# Patient Record
Sex: Female | Born: 1935 | Race: White | Hispanic: No | Smoking: Former smoker
Health system: Southern US, Community
[De-identification: ages and names within clinical notes are randomized; demographics above are authoritative.]

## PROBLEM LIST (undated history)

## (undated) DIAGNOSIS — R232 Flushing: Secondary | ICD-10-CM

## (undated) DIAGNOSIS — M797 Fibromyalgia: Secondary | ICD-10-CM

## (undated) DIAGNOSIS — Z9889 Other specified postprocedural states: Secondary | ICD-10-CM

## (undated) DIAGNOSIS — K449 Diaphragmatic hernia without obstruction or gangrene: Secondary | ICD-10-CM

## (undated) DIAGNOSIS — A46 Erysipelas: Secondary | ICD-10-CM

## (undated) DIAGNOSIS — K219 Gastro-esophageal reflux disease without esophagitis: Secondary | ICD-10-CM

## (undated) DIAGNOSIS — M1990 Unspecified osteoarthritis, unspecified site: Secondary | ICD-10-CM

## (undated) DIAGNOSIS — N281 Cyst of kidney, acquired: Secondary | ICD-10-CM

## (undated) DIAGNOSIS — Z860100 Personal history of colon polyps, unspecified: Secondary | ICD-10-CM

## (undated) DIAGNOSIS — F419 Anxiety disorder, unspecified: Secondary | ICD-10-CM

## (undated) DIAGNOSIS — Z5189 Encounter for other specified aftercare: Secondary | ICD-10-CM

## (undated) DIAGNOSIS — I471 Supraventricular tachycardia, unspecified: Secondary | ICD-10-CM

## (undated) DIAGNOSIS — E785 Hyperlipidemia, unspecified: Secondary | ICD-10-CM

## (undated) DIAGNOSIS — I4891 Unspecified atrial fibrillation: Secondary | ICD-10-CM

## (undated) DIAGNOSIS — K5730 Diverticulosis of large intestine without perforation or abscess without bleeding: Secondary | ICD-10-CM

## (undated) DIAGNOSIS — I059 Rheumatic mitral valve disease, unspecified: Secondary | ICD-10-CM

## (undated) DIAGNOSIS — IMO0001 Reserved for inherently not codable concepts without codable children: Secondary | ICD-10-CM

## (undated) DIAGNOSIS — I4892 Unspecified atrial flutter: Secondary | ICD-10-CM

## (undated) DIAGNOSIS — E079 Disorder of thyroid, unspecified: Secondary | ICD-10-CM

## (undated) DIAGNOSIS — R112 Nausea with vomiting, unspecified: Secondary | ICD-10-CM

## (undated) DIAGNOSIS — Z9189 Other specified personal risk factors, not elsewhere classified: Secondary | ICD-10-CM

## (undated) HISTORY — DX: Diaphragmatic hernia without obstruction or gangrene: K44.9

## (undated) HISTORY — DX: Rheumatic mitral valve disease, unspecified: I05.9

## (undated) HISTORY — DX: Erysipelas: A46

## (undated) HISTORY — DX: Cyst of kidney, acquired: N28.1

## (undated) HISTORY — DX: Anxiety disorder, unspecified: F41.9

## (undated) HISTORY — PX: ABDOMINAL HYSTERECTOMY: SHX81

## (undated) HISTORY — DX: Diverticulosis of large intestine without perforation or abscess without bleeding: K57.30

## (undated) HISTORY — PX: OOPHORECTOMY: SHX86

## (undated) HISTORY — DX: Reserved for inherently not codable concepts without codable children: IMO0001

## (undated) HISTORY — DX: Disorder of thyroid, unspecified: E07.9

## (undated) HISTORY — DX: Supraventricular tachycardia, unspecified: I47.10

## (undated) HISTORY — DX: Supraventricular tachycardia: I47.1

## (undated) HISTORY — PX: UPPER GASTROINTESTINAL ENDOSCOPY: SHX188

## (undated) HISTORY — PX: OTHER SURGICAL HISTORY: SHX169

## (undated) HISTORY — DX: Hyperlipidemia, unspecified: E78.5

## (undated) HISTORY — DX: Unspecified osteoarthritis, unspecified site: M19.90

## (undated) HISTORY — DX: Personal history of colon polyps, unspecified: Z86.0100

## (undated) HISTORY — PX: ESOPHAGOGASTRODUODENOSCOPY: SHX1529

## (undated) HISTORY — DX: Encounter for other specified aftercare: Z51.89

## (undated) HISTORY — DX: Fibromyalgia: M79.7

## (undated) HISTORY — DX: Other specified personal risk factors, not elsewhere classified: Z91.89

## (undated) HISTORY — PX: CATARACT EXTRACTION BILATERAL W/ ANTERIOR VITRECTOMY: SHX1304

## (undated) HISTORY — DX: Personal history of colonic polyps: Z86.010

## (undated) HISTORY — PX: COLONOSCOPY: SHX174

## (undated) HISTORY — PX: BREAST BIOPSY: SHX20

## (undated) HISTORY — DX: Flushing: R23.2

## (undated) HISTORY — DX: Gastro-esophageal reflux disease without esophagitis: K21.9

---
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
## 1997-12-15 ENCOUNTER — Other Ambulatory Visit: Admission: RE | Admit: 1997-12-15 | Discharge: 1997-12-15 | Payer: Self-pay | Admitting: Obstetrics & Gynecology

## 2000-01-06 ENCOUNTER — Ambulatory Visit (HOSPITAL_COMMUNITY): Admission: RE | Admit: 2000-01-06 | Discharge: 2000-01-07 | Payer: Self-pay | Admitting: Internal Medicine

## 2000-01-06 ENCOUNTER — Encounter: Payer: Self-pay | Admitting: Internal Medicine

## 2001-08-19 HISTORY — PX: ROTATOR CUFF REPAIR: SHX139

## 2001-09-13 ENCOUNTER — Observation Stay (HOSPITAL_COMMUNITY): Admission: RE | Admit: 2001-09-13 | Discharge: 2001-09-14 | Payer: Self-pay | Admitting: Orthopedic Surgery

## 2002-01-08 ENCOUNTER — Other Ambulatory Visit: Admission: RE | Admit: 2002-01-08 | Discharge: 2002-01-08 | Payer: Self-pay | Admitting: Obstetrics & Gynecology

## 2004-04-05 ENCOUNTER — Other Ambulatory Visit: Admission: RE | Admit: 2004-04-05 | Discharge: 2004-04-05 | Payer: Self-pay | Admitting: Obstetrics & Gynecology

## 2004-12-13 ENCOUNTER — Ambulatory Visit: Payer: Self-pay | Admitting: Gastroenterology

## 2004-12-27 ENCOUNTER — Ambulatory Visit: Payer: Self-pay | Admitting: Gastroenterology

## 2005-06-09 ENCOUNTER — Ambulatory Visit: Payer: Self-pay | Admitting: Pulmonary Disease

## 2005-06-14 ENCOUNTER — Ambulatory Visit: Payer: Self-pay | Admitting: Pulmonary Disease

## 2006-07-11 ENCOUNTER — Ambulatory Visit: Payer: Self-pay | Admitting: Pulmonary Disease

## 2007-07-19 ENCOUNTER — Ambulatory Visit: Payer: Self-pay | Admitting: Pulmonary Disease

## 2007-07-19 LAB — CONVERTED CEMR LAB
ALT: 15 units/L (ref 0–35)
AST: 17 units/L (ref 0–37)
Albumin: 4 g/dL (ref 3.5–5.2)
Alkaline Phosphatase: 83 units/L (ref 39–117)
BUN: 13 mg/dL (ref 6–23)
Basophils Absolute: 0.1 10*3/uL (ref 0.0–0.1)
Basophils Relative: 0.8 % (ref 0.0–1.0)
Bilirubin Urine: NEGATIVE
Bilirubin, Direct: 0.1 mg/dL (ref 0.0–0.3)
CO2: 33 meq/L — ABNORMAL HIGH (ref 19–32)
Calcium: 9.2 mg/dL (ref 8.4–10.5)
Chloride: 104 meq/L (ref 96–112)
Cholesterol: 245 mg/dL (ref 0–200)
Creatinine, Ser: 0.8 mg/dL (ref 0.4–1.2)
Direct LDL: 175.3 mg/dL
Eosinophils Absolute: 0.2 10*3/uL (ref 0.0–0.6)
Eosinophils Relative: 3 % (ref 0.0–5.0)
GFR calc Af Amer: 91 mL/min
GFR calc non Af Amer: 75 mL/min
Glucose, Bld: 93 mg/dL (ref 70–99)
HCT: 37.6 % (ref 36.0–46.0)
HDL: 42.7 mg/dL (ref >39.0)
Hemoglobin, Urine: NEGATIVE
Hemoglobin: 13.1 g/dL (ref 12.0–15.0)
Ketones, ur: NEGATIVE mg/dL
Leukocytes, UA: NEGATIVE
Lymphocytes Relative: 20.5 % (ref 12.0–46.0)
MCHC: 34.9 g/dL (ref 30.0–36.0)
MCV: 98.5 fL (ref 78.0–100.0)
Monocytes Absolute: 0.5 10*3/uL (ref 0.2–0.7)
Monocytes Relative: 7.7 % (ref 3.0–11.0)
Neutro Abs: 4.6 10*3/uL (ref 1.4–7.7)
Neutrophils Relative %: 68 % (ref 43.0–77.0)
Nitrite: NEGATIVE
Platelets: 222 10*3/uL (ref 150–400)
Potassium: 4.6 meq/L (ref 3.5–5.1)
RBC: 3.82 M/uL — ABNORMAL LOW (ref 3.87–5.11)
RDW: 12 % (ref 11.5–14.6)
Sodium: 142 meq/L (ref 135–145)
Specific Gravity, Urine: <=1.005 (ref 1.000–1.03)
TSH: 3.84 microintl units/mL (ref 0.35–5.50)
Total Bilirubin: 0.5 mg/dL (ref 0.3–1.2)
Total CHOL/HDL Ratio: 5.7
Total Protein, Urine: NEGATIVE mg/dL
Total Protein: 6.8 g/dL (ref 6.0–8.3)
Triglycerides: 108 mg/dL (ref 0–149)
Urine Glucose: NEGATIVE mg/dL
Urobilinogen, UA: 0.2 (ref 0.0–1.0)
VLDL: 22 mg/dL (ref 0–40)
WBC: 6.8 10*3/uL (ref 4.5–10.5)
pH: 7 (ref 5.0–8.0)

## 2007-07-20 ENCOUNTER — Ambulatory Visit: Payer: Self-pay | Admitting: Internal Medicine

## 2007-07-28 DIAGNOSIS — IMO0001 Reserved for inherently not codable concepts without codable children: Secondary | ICD-10-CM

## 2007-07-28 DIAGNOSIS — I059 Rheumatic mitral valve disease, unspecified: Secondary | ICD-10-CM

## 2007-07-28 DIAGNOSIS — Z9189 Other specified personal risk factors, not elsewhere classified: Secondary | ICD-10-CM

## 2007-07-28 DIAGNOSIS — F411 Generalized anxiety disorder: Secondary | ICD-10-CM | POA: Insufficient documentation

## 2007-07-28 DIAGNOSIS — I498 Other specified cardiac arrhythmias: Secondary | ICD-10-CM | POA: Insufficient documentation

## 2007-07-28 DIAGNOSIS — E039 Hypothyroidism, unspecified: Secondary | ICD-10-CM | POA: Insufficient documentation

## 2007-07-28 HISTORY — DX: Reserved for inherently not codable concepts without codable children: IMO0001

## 2007-07-28 HISTORY — DX: Rheumatic mitral valve disease, unspecified: I05.9

## 2007-07-28 HISTORY — DX: Other specified personal risk factors, not elsewhere classified: Z91.89

## 2007-08-07 ENCOUNTER — Ambulatory Visit: Payer: Self-pay | Admitting: Pulmonary Disease

## 2007-08-07 LAB — CONVERTED CEMR LAB
Fecal Occult Blood: NEGATIVE
OCCULT 1: NEGATIVE
OCCULT 2: NEGATIVE
OCCULT 3: NEGATIVE
OCCULT 4: NEGATIVE
OCCULT 5: NEGATIVE

## 2007-09-05 ENCOUNTER — Telehealth: Payer: Self-pay | Admitting: Pulmonary Disease

## 2007-10-25 ENCOUNTER — Ambulatory Visit: Payer: Self-pay | Admitting: Internal Medicine

## 2008-05-13 ENCOUNTER — Ambulatory Visit: Payer: Self-pay | Admitting: Internal Medicine

## 2008-05-13 ENCOUNTER — Encounter: Payer: Self-pay | Admitting: Adult Health

## 2008-05-13 LAB — CONVERTED CEMR LAB: Vit D, 1,25-Dihydroxy: 36 (ref 30–89)

## 2008-05-15 ENCOUNTER — Telehealth (INDEPENDENT_AMBULATORY_CARE_PROVIDER_SITE_OTHER): Payer: Self-pay | Admitting: *Deleted

## 2008-05-16 LAB — CONVERTED CEMR LAB: TSH: 4.56 microintl units/mL (ref 0.35–5.50)

## 2008-05-20 ENCOUNTER — Telehealth (INDEPENDENT_AMBULATORY_CARE_PROVIDER_SITE_OTHER): Payer: Self-pay | Admitting: *Deleted

## 2008-07-15 ENCOUNTER — Telehealth (INDEPENDENT_AMBULATORY_CARE_PROVIDER_SITE_OTHER): Payer: Self-pay | Admitting: *Deleted

## 2008-07-22 ENCOUNTER — Ambulatory Visit: Payer: Self-pay | Admitting: Pulmonary Disease

## 2008-07-22 LAB — CONVERTED CEMR LAB
TSH: 1.17 microintl units/mL (ref 0.35–5.50)
Vit D, 1,25-Dihydroxy: 50 (ref 30–89)

## 2008-08-01 ENCOUNTER — Ambulatory Visit: Payer: Self-pay | Admitting: Internal Medicine

## 2008-08-04 ENCOUNTER — Ambulatory Visit: Payer: Self-pay | Admitting: Pulmonary Disease

## 2008-08-19 ENCOUNTER — Telehealth: Payer: Self-pay | Admitting: Pulmonary Disease

## 2008-08-22 ENCOUNTER — Ambulatory Visit: Payer: Self-pay | Admitting: Pulmonary Disease

## 2008-08-25 LAB — CONVERTED CEMR LAB
AST: 17 units/L (ref 0–37)
Alkaline Phosphatase: 81 units/L (ref 39–117)
Basophils Absolute: 0 10*3/uL (ref 0.0–0.1)
Chloride: 103 meq/L (ref 96–112)
Eosinophils Absolute: 0.2 10*3/uL (ref 0.0–0.7)
Eosinophils Relative: 3.1 % (ref 0.0–5.0)
GFR calc non Af Amer: 75 mL/min
HDL: 34.7 mg/dL — ABNORMAL LOW (ref >39.0)
MCV: 98.8 fL (ref 78.0–100.0)
Neutrophils Relative %: 62.4 % (ref 43.0–77.0)
Platelets: 182 10*3/uL (ref 150–400)
Potassium: 4.6 meq/L (ref 3.5–5.1)
Total Bilirubin: 0.6 mg/dL (ref 0.3–1.2)
Triglycerides: 108 mg/dL (ref 0–149)
VLDL: 22 mg/dL (ref 0–40)
WBC: 5.9 10*3/uL (ref 4.5–10.5)

## 2008-09-05 ENCOUNTER — Ambulatory Visit: Payer: Self-pay | Admitting: Pulmonary Disease

## 2008-09-23 ENCOUNTER — Telehealth (INDEPENDENT_AMBULATORY_CARE_PROVIDER_SITE_OTHER): Payer: Self-pay | Admitting: *Deleted

## 2008-09-29 ENCOUNTER — Telehealth (INDEPENDENT_AMBULATORY_CARE_PROVIDER_SITE_OTHER): Payer: Self-pay | Admitting: *Deleted

## 2008-10-15 ENCOUNTER — Telehealth (INDEPENDENT_AMBULATORY_CARE_PROVIDER_SITE_OTHER): Payer: Self-pay | Admitting: *Deleted

## 2008-10-28 ENCOUNTER — Telehealth: Payer: Self-pay | Admitting: Pulmonary Disease

## 2008-10-30 ENCOUNTER — Ambulatory Visit: Payer: Self-pay | Admitting: Pulmonary Disease

## 2008-10-31 ENCOUNTER — Telehealth: Payer: Self-pay | Admitting: Pulmonary Disease

## 2008-11-10 ENCOUNTER — Ambulatory Visit: Payer: Self-pay | Admitting: Pulmonary Disease

## 2008-12-08 ENCOUNTER — Ambulatory Visit: Payer: Self-pay | Admitting: Internal Medicine

## 2009-02-09 ENCOUNTER — Ambulatory Visit: Payer: Self-pay | Admitting: Internal Medicine

## 2009-02-24 ENCOUNTER — Telehealth: Payer: Self-pay | Admitting: Internal Medicine

## 2009-02-25 ENCOUNTER — Encounter (INDEPENDENT_AMBULATORY_CARE_PROVIDER_SITE_OTHER): Payer: Self-pay | Admitting: *Deleted

## 2009-03-05 ENCOUNTER — Telehealth: Payer: Self-pay | Admitting: Pulmonary Disease

## 2009-03-09 ENCOUNTER — Ambulatory Visit: Payer: Self-pay | Admitting: Pulmonary Disease

## 2009-03-18 ENCOUNTER — Ambulatory Visit: Payer: Self-pay | Admitting: Pulmonary Disease

## 2009-03-18 LAB — CONVERTED CEMR LAB
Albumin: 3.8 g/dL (ref 3.5–5.2)
HDL: 39.1 mg/dL (ref >39.00)
LDL Cholesterol: 108 mg/dL — ABNORMAL HIGH (ref 0–99)
Total CHOL/HDL Ratio: 4
Triglycerides: 98 mg/dL (ref 0.0–149.0)
VLDL: 19.6 mg/dL (ref 0.0–40.0)

## 2009-04-16 ENCOUNTER — Telehealth: Payer: Self-pay | Admitting: Pulmonary Disease

## 2009-04-30 ENCOUNTER — Ambulatory Visit: Payer: Self-pay | Admitting: Internal Medicine

## 2009-08-06 ENCOUNTER — Ambulatory Visit: Payer: Self-pay | Admitting: Pulmonary Disease

## 2009-08-06 ENCOUNTER — Encounter (INDEPENDENT_AMBULATORY_CARE_PROVIDER_SITE_OTHER): Payer: Self-pay | Admitting: *Deleted

## 2009-08-09 LAB — CONVERTED CEMR LAB
ALT: 65 units/L — ABNORMAL HIGH (ref 0–35)
BUN: 10 mg/dL (ref 6–23)
Basophils Absolute: 0 10*3/uL (ref 0.0–0.1)
Bilirubin, Direct: 0.1 mg/dL (ref 0.0–0.3)
CRP, High Sensitivity: 6.4 — ABNORMAL HIGH (ref 0.00–5.00)
Chloride: 102 meq/L (ref 96–112)
Cholesterol: 210 mg/dL — ABNORMAL HIGH (ref 0–200)
Creatinine, Ser: 0.7 mg/dL (ref 0.4–1.2)
Direct LDL: 139.2 mg/dL
Eosinophils Relative: 2 % (ref 0.0–5.0)
GFR calc non Af Amer: 87.26 mL/min (ref >60)
HDL: 41.3 mg/dL (ref >39.00)
MCV: 98.7 fL (ref 78.0–100.0)
Monocytes Absolute: 0.5 10*3/uL (ref 0.1–1.0)
Neutrophils Relative %: 68.1 % (ref 43.0–77.0)
Platelets: 172 10*3/uL (ref 150.0–400.0)
Total Bilirubin: 1 mg/dL (ref 0.3–1.2)
VLDL: 31.4 mg/dL (ref 0.0–40.0)
WBC: 6.2 10*3/uL (ref 4.5–10.5)

## 2009-08-12 ENCOUNTER — Telehealth (INDEPENDENT_AMBULATORY_CARE_PROVIDER_SITE_OTHER): Payer: Self-pay | Admitting: *Deleted

## 2009-08-14 ENCOUNTER — Ambulatory Visit (HOSPITAL_COMMUNITY): Admission: RE | Admit: 2009-08-14 | Discharge: 2009-08-14 | Payer: Self-pay | Admitting: Pulmonary Disease

## 2009-09-07 ENCOUNTER — Telehealth: Payer: Self-pay | Admitting: Gastroenterology

## 2009-09-19 HISTORY — PX: BREAST BIOPSY: SHX20

## 2009-11-26 ENCOUNTER — Encounter (INDEPENDENT_AMBULATORY_CARE_PROVIDER_SITE_OTHER): Payer: Self-pay | Admitting: *Deleted

## 2010-07-28 ENCOUNTER — Encounter: Payer: Self-pay | Admitting: Pulmonary Disease

## 2010-08-02 ENCOUNTER — Encounter: Payer: Self-pay | Admitting: Pulmonary Disease

## 2010-08-02 ENCOUNTER — Other Ambulatory Visit: Admission: RE | Admit: 2010-08-02 | Discharge: 2010-08-02 | Payer: Self-pay | Admitting: Radiology

## 2010-08-09 ENCOUNTER — Telehealth: Payer: Self-pay | Admitting: Pulmonary Disease

## 2010-08-16 ENCOUNTER — Ambulatory Visit: Payer: Self-pay | Admitting: Pulmonary Disease

## 2010-09-03 LAB — CONVERTED CEMR LAB
ALT: 15 units/L (ref 0–35)
BUN: 17 mg/dL (ref 6–23)
Bilirubin, Direct: 0.1 mg/dL (ref 0.0–0.3)
Calcium: 9 mg/dL (ref 8.4–10.5)
Cholesterol: 183 mg/dL (ref 0–200)
Creatinine, Ser: 0.7 mg/dL (ref 0.4–1.2)
Eosinophils Relative: 2.9 % (ref 0.0–5.0)
GFR calc non Af Amer: 81.61 mL/min (ref >60)
Lymphocytes Relative: 18.5 % (ref 12.0–46.0)
Monocytes Relative: 7.4 % (ref 3.0–12.0)
Neutrophils Relative %: 70.8 % (ref 43.0–77.0)
Platelets: 202 10*3/uL (ref 150.0–400.0)
Potassium: 4.7 meq/L (ref 3.5–5.1)
TSH: 1.44 microintl units/mL (ref 0.35–5.50)
Total Protein: 6.4 g/dL (ref 6.0–8.3)
VLDL: 20 mg/dL (ref 0.0–40.0)
WBC: 7.2 10*3/uL (ref 4.5–10.5)

## 2010-09-19 HISTORY — PX: KNEE ARTHROSCOPY: SUR90

## 2010-10-13 ENCOUNTER — Ambulatory Visit
Admission: RE | Admit: 2010-10-13 | Discharge: 2010-10-13 | Payer: Self-pay | Source: Home / Self Care | Attending: Pulmonary Disease | Admitting: Pulmonary Disease

## 2010-10-21 NOTE — Letter (Signed)
Summary: Colonoscopy Letter  [facility]  [address] [place], [place] [address]   Phone: [phone]  Fax: [phone]      November 26, 2009 MRN: [id number]   [facility] [address] [place], [place]  [address]   Dear Ms. Dalton,   According to your medical record, it is time for you to schedule a Colonoscopy. The American Cancer Society recommends this procedure as a method to detect early colon cancer. Patients with a family history of colon cancer, or a personal history of colon polyps or inflammatory bowel disease are at increased risk.  This letter has beeen generated based on the recommendations made at the time of your procedure. If you feel that in your particular situation this may no longer apply, please contact our office.  Please call our office at [phone] to schedule this appointment or to update your records at your earliest convenience.  Thank you for cooperating with [place] to provide you with the very best care possible.   Sincerely,   Vania Rea. Jarold Motto, M.D.  [facility] Gastroenterology Division [phone]

## 2010-10-21 NOTE — Progress Notes (Signed)
Summary: set up labs  Phone Note Call from Patient Call back at Home Phone [phone]   Caller: Patient Call For: nadel Summary of Call: pt wants labs set up for pending physical 08/19/10 Initial call taken by: Tivis Ringer, CNA,  August 09, 2010 4:54 PM  Follow-up for Phone Call        pt coming on 11/28 for labs pls ask sn what labs need to be done and put in the system --no need to call pt back Follow-up by: Philipp Deputy CMA,  August 09, 2010 4:59 PM  Additional Follow-up for Phone Call Additional follow up Details #1::        labs are in the computer for pt and she is aware of labs in computer Randell Loop CMA  August 10, 2010 5:01 PM

## 2010-11-04 NOTE — Assessment & Plan Note (Signed)
Summary: cpx/ mbw   Primary Care Provider:  Dr. Alroy Dust  CC:  14 month ROV & review of mult medical problems....  History of Present Illness: 76 y/o Hart here for a follow up visit... he has multiple medical problems as noted below...     ~  Nov09:  seen 10/08 c/o decr energy and wanted her Synthroid adjusted- changed from 75 to 1mcg/d w/ f/u TSH 11/09 = 1.17 and she feels better and is much happier... she has done some internet research and she wants to keep this level in the 1-2 range...  she takes mult herbal and vitamin preps from [employer]- CoQ10, Grape seed extract, Red yeast rice, MVI, Garlic, & Folate...   ~  Jun10:  she notes aching/ sore all over and blames the Pravachol40 ("I hurt, and can't move as fast on the tennis court")... she stopped it for 1 month without change in her symptoms, but she still wants off the [place] Rx...  She developed a rash & was seen by DrGould w/ Dx of "granuloma annulaire" & treated w/ Dapsone (she notes they did blood work & reports that it was normal> * subseq tells me this med affected her liver & her blood count & was DC'd).  ~  Nov10:  she notes some recent sinus symptoms & is using saline nasal spray... she feels that she is having a problem w/ her HH- c/o reflux symptoms & epig discomfort & would like a f/u appt w/ GI, DrPatterson (*she never went) & in the interim change her Prn Pepcid to PROTONIX 40mg  Qam & Pepcid 20mg Qhs... she had a f/u visit w/ DrTaylor 8/10- hx SVT improved w/ incr Metoprolol to 1/2 tab Qhs, but she credits stopping Aleve ("people who take alot of Aleve get heart problems" she say).   ~  October 13, 2010:  25mo ROV- under incr stress w/ husb illness, LBP (VAH & DrRamos)... she had abn mammogram w/ benign bx 11/11 & f/u planned 41mo... she's had some palpit but improved off Aleve rx she says & advised decr caffeine etc... she is overdue for f/u w/ GI DrPatterson but declines to let us set this up for her... she had labs 11/11-  reviewed w/ pt; she requests 90d refill rx's.   Current Problem List:  CHEST WALL PAIN, HX OF (ICD-V15.89) - she takes ASA 81mg /d...  ~  11/10: she request hs-CRP test & we discussed this... it ret elevated= 6.4 & she will repeat it & discuss poss of further cardiac eval w/ DrTaylor...  ? of MITRAL VALVE PROLAPSE (ICD-424.0) - baseline EKG is WNL.[name] Kitchen. can't find prev 2DEcho (old chart in [facility] office)... currently denies CP or palpitations...  SUPRAVENTRICULAR TACHYCARDIA (ICD-427.89) - hx SVT w/ electrophysiologic studies by DrTaylor in 2001 w/ successful RF ablation... he placed her on Metoprolol 25mg  Qhs but she has since stopped this on her own.  ~  8/10:  f/u note from DrTaylor reviewed.  ~  she states palpit have decreased since she stopped Aleve.  HYPERCHOLESTEROLEMIA (ICD-272.0) - hx of significantly elevated cholesterol values- she has refused Med Rx and tries to control this w/ diet + Fish Oil + Red Yeast Rice, etc.  ~  Her best FLP over the last 56yrs was 3/03 w/ TChol 204, TG 93, HDL 54, LDL 132  ~  FLP 10/08 showed TChol 245, TG 108, HDL 43, LDL 175... tried Zocor- stopped due to rash.  ~  FLP 12/09 showed TChol 228, TG 108,  HDL 35, LDL 161... rec> try Prav40/d.  ~  FLP 6/10 on Prav40 showed TChol 167, TG 98, HDL 39, LDL 108... she wants off PravRx.  ~  11/10 she is off the Prav40- & notes that "cholesterol is not as big a deal as they previously thought"...      FLP showed TChol 210, TG 157, HDL 41, LDL 139... pt offered Lipid Clinic appt & she will think about it.  ~  FLP 11/11 showed TChol 183, TG 100, HDL 41, LDL 122  HYPOTHYROIDISM (ICD-244.9) - on SYNTHROID 81mcg/d now...  ~  labs 11/09 showed TSH= 1.17, and she wants to keep it betw 1-2... c/o cost of Synthroid, therefore we will switch her to generic Levothyroid 77mcg/d and recheck TSH.  ~  labs 2/10 showed TSH= 0.82  ~  labs 11/10 showed TSH= 1.07  ~  labs 11/11 showed TSH= 1.44  HIATAL HERNIA (ICD-553.3) - EGD  1998 showed smallHH, min esophagitis... she uses Pepcid Prn...  ~  11/10: pt notes incr reflux symptoms and epig discomfort- started on PROTONIX 40Qam & Pepcid Qhs... sl elevation of SGOT=56, SGPT=65> check AbdSonar & refered to GI for eval...  DIVERTICULOSIS OF COLON (ICD-562.10),  COLONIC POLYPS (ICD-211.3) - followed for GI by DrPatterson w/ last colonoscopy 4/06- divertics only & f/u planned 55yrs... last polyp removed 4/03= 12mm tubular adenoma...  ~  1/12:  she isoverdue for f/u colonoscopy but wants to call herself to set this up.  RENAL CYST, RIGHT (ICD-593.2) - small benign right renal cyst seen on ultrasound in 1998...  FIBROMYALGIA (ICD-729.1) - she saw DrTruslow in 1997 for polyarthralgias and prob fibromylagia... treated w/ Flexeril at that time...   ~  11/10: she has several trigger points but doesn't believe that she has FM...  ANXIETY (ICD-300.00) - some stress caring for husb...  Hx of ERYSIPELAS (ICD-035) - left facial erysipelas in 2004 responded to Keflex...  Health Maintenance -  GYN= DrNeal on [facility] [facility]... she gets her Mammograms at [facility] and had a BMD here in 2003 which was WNL, since then f/u BMD screening at [facility] office & stable... Vit D level here 11/09 was 50... she takes MVI + Vit D 1000 u daily... she had the 2010 flu vaccine 10/10.   Preventive Screening-Counseling & Management  Alcohol-Tobacco     Smoking Status: quit     Year Quit: 1968  Comments: smoked 1 pack per month  Allergies: 1)  ! Codeine 2)  ! Ultram (Tramadol Hcl) 3)  Codeine Phosphate (Codeine Phosphate)  Comments:  Nurse/Medical Assistant: The patient's medications and allergies were reviewed with the patient and were updated in the Medication and Allergy Lists.  Past History:  Past Medical History: CHEST WALL PAIN, HX OF (ICD-V15.89) MITRAL VALVE PROLAPSE (ICD-424.0) SUPRAVENTRICULAR TACHYCARDIA (ICD-427.89) HYPERCHOLESTEROLEMIA (ICD-272.0) HYPOTHYROIDISM (ICD-244.9) HIATAL  HERNIA (ICD-553.3) DIVERTICULOSIS OF COLON (ICD-562.10) COLONIC POLYPS (ICD-211.3) RENAL CYST, RIGHT (ICD-593.2) FIBROMYALGIA (ICD-729.1) ANXIETY (ICD-300.00) Hx of ERYSIPELAS (ICD-035)  Past Surgical History: S/P hysterectomy S/P left cataract surgery 1996 by DrGroat S/P right cataract surgery 2001 by DrGroat S/P left shoulder surgery 12/02 by DrGioffre  Family History: Reviewed history from 03/18/2009 and no changes required. mother deceased age 18 from sudden heart failure father deceased age 33 from parkinsons disease  hx of arthritis 1 brother alive and well age 4---hx of hyperchol  Social History: Reviewed history from 03/18/2009 and no changes required. married 1 daughter quit smoking in 1968--smoked for 2-3 years---1 pack per month occ alcohol use---wine weekly  exposed to second hand smoke exercises now 1 times per week caffeine use daily--coffee Smoking Status:  quit  Review of Systems  The patient denies fever, chills, sweats, anorexia, fatigue, weakness, malaise, weight loss, sleep disorder, blurring, diplopia, eye irritation, eye discharge, vision loss, eye pain, photophobia, earache, ear discharge, tinnitus, decreased hearing, nasal congestion, nosebleeds, sore throat, hoarseness, chest pain, palpitations, syncope, dyspnea on exertion, orthopnea, PND, peripheral edema, cough, dyspnea at rest, excessive sputum, hemoptysis, wheezing, pleurisy, nausea, vomiting, diarrhea, constipation, change in bowel habits, abdominal pain, melena, hematochezia, jaundice, gas/bloating, indigestion/heartburn, dysphagia, odynophagia, dysuria, hematuria, urinary frequency, urinary hesitancy, nocturia, incontinence, back pain, joint pain, joint swelling, muscle cramps, muscle weakness, stiffness, arthritis, sciatica, restless legs, leg pain at night, leg pain with exertion, rash, itching, dryness, suspicious lesions, paralysis, paresthesias, seizures, tremors, vertigo, transient blindness,  frequent falls, frequent headaches, difficulty walking, depression, anxiety, memory loss, confusion, cold intolerance, heat intolerance, polydipsia, polyphagia, polyuria, unusual weight change, abnormal bruising, bleeding, enlarged lymph nodes, urticaria, allergic rash, hay fever, and recurrent infections.    Vital Signs:  Patient profile:   76 year old female Height:      63 inches Weight:      142.13 pounds BMI:     25.27 O2 Sat:      98 % on Room air Temp:     99.2 degrees F oral Pulse rate:   62 / minute BP sitting:   124 / Marissa  (left arm) Cuff size:   regular  Vitals Entered By: Randell Loop CMA (October 13, 2010 10:34 AM)  O2 Sat at Rest %:  98 O2 Flow:  Room air CC: 14 month ROV & review of mult medical problems... Is Patient Diabetic? No Pain Assessment Patient in pain? no      Comments meds updated today with pt   Physical Exam  Additional Exam:  WD, WN, 76 y/o Hart in NAD... GENERAL:  Alert & oriented; pleasant & cooperative... HEENT:  [place]/AT, EOM-wnl, PERRLA, EACs-clear, TMs-wnl, NOSE-clear, THROAT-clear & wnl. NECK:  Supple w/ fairROM; no JVD; normal carotid impulses w/o bruits; no thyromegaly or nodules palpated; no lymphadenopathy. CHEST:  Clear to P & A; without wheezes/ rales/ or rhonchi. HEART:  Regular Rhythm; without murmurs/ rubs/ or gallops. ABDOMEN:  Soft & nontender; normal bowel sounds; no organomegaly or masses detected. EXT: without deformities, mild arthritic changes; no varicose veins/ venous insuffic/ or edema. NEURO:  no focal neuro deficits... DERM:  fine macular/papular rash along upper back, neck and under later axilla, no sign redness.     Impression & Recommendations:  Problem # 1:  SUPRAVENTRICULAR TACHYCARDIA (ICD-427.89) She states that her palpit all diminished 7 resolved off Aleve rx... advised decr caffeine as well... The following medications were removed from the medication list:    Metoprolol Tartrate 50 Mg Tabs (Metoprolol  tartrate) .[name] Kitchen... 1/2  by mouth at night Her updated medication list for this problem includes:    Aspirin 81 Mg Tbec (Aspirin) .[name] Kitchen... Take 1 tab  3 times a week  Problem # 2:  HYPERCHOLESTEROLEMIA (ICD-272.0) She is committed to comntrolling this w/ diet & OTC Rx (refuses statin therapy or lipid clinic)...  Problem # 3:  HYPOTHYROIDISM (ICD-244.9) Stable on Synthroid88... Her updated medication list for this problem includes:    Synthroid 88 Mcg Tabs (Levothyroxine sodium) .[name] Kitchen... Take 1 tab by mouth once daily...  Problem # 4:  GI >>> She stopped prev Protonix Rx on her own & is advised to use OTC Prilosec Prn... she  is overdue for f/u colon & declines offer to set up appt for her> she will call on her own...  Problem # 5:  ANXIETY (ICD-300.00) She is under a good bit of stress as noted> but she declines offer of anxiolytic rx...  Problem # 6:  OTHER MEDICAL PROBLEMS AS NOTRED...  Complete Medication List: 1)  Aspirin 81 Mg Tbec (Aspirin) .... Take 1 tab  3 times a week 2)  Fish Oil 1000 Mg Caps (Omega-3 fatty acids) .... Take 1 tablet by mouth once a day 3)  Red Yeast Rice 600 Mg Caps (Red yeast rice extract) .... Take 1 tablet by mouth once a day 4)  Synthroid 88 Mcg Tabs (Levothyroxine sodium) .... Take 1 tab by mouth once daily.[name] KitchenMarland Kitchen 5)  Vivelle-dot 0.05 Mg/24hr Pttw (Estradiol) .... Once daily 6)  Senior Multivitamin Plus Tabs (Multiple vitamins-minerals) .... Take 1 tablet by mouth once a day 7)  Vitamin C 500 Mg Tabs (Ascorbic acid) .... Take 1 tablet by mouth once a day 8)  Vitamin D 1000 Unit Tabs (Cholecalciferol) .... Take 1 tablet by mouth once a day 9)  Tylenol 325 Mg Tabs (Acetaminophen) .... As needed for arthritis pain  Patient Instructions: 1)  Today we updated your med list- see below.... 2)  We refilled your Synthroid per request... 3)  We reviewed your labs from 11/11... 4)  Keep up the good work w/ diet + exercise.... 5)  Call for any  problems... Prescriptions: SYNTHROID 88 MCG TABS (LEVOTHYROXINE SODIUM) take 1 tab by mouth once daily... Brand medically necessary #90 x 4   Entered and Authorized by:   Michele Mcalpine MD   Signed by:   Michele Mcalpine MD on 10/13/2010   Method used:   Print then Give to Patient   RxID:   [id number]    Immunization History:  Influenza Immunization History:    Influenza:  historical (07/05/2010)

## 2010-11-26 ENCOUNTER — Encounter (INDEPENDENT_AMBULATORY_CARE_PROVIDER_SITE_OTHER): Payer: Self-pay | Admitting: *Deleted

## 2010-11-30 NOTE — Letter (Signed)
Summary: Pre Visit Letter Revised  [facility]  [address] [place], [place] [address]   Phone: [phone]  Fax: [phone]        11/26/2010 MRN: [id number] [facility] [address] [place], [place]  [address]             Procedure Date:  December 29, 2010   recall col Dr Jarold Motto   Welcome to the Gastroenterology Division at [facility].    You are scheduled to see a nurse for your pre-procedure visit on December 15, 2010 at 9:00am on the 3rd floor at [facility], [address]  We ask that you try to arrive at our office 15 minutes prior to your appointment time to allow for check-in.  Please take a minute to review the attached form.  If you answer "Yes" to one or more of the questions on the first page, we ask that you call the person listed at your earliest opportunity.  If you answer "No" to all of the questions, please complete the rest of the form and bring it to your appointment.    Your nurse visit will consist of discussing your medical and surgical history, your immediate family medical history, and your medications.   If you are unable to list all of your medications on the form, please bring the medication bottles to your appointment and we will list them.  We will need to be aware of both prescribed and over the counter drugs.  We will need to know exact dosage information as well.    Please be prepared to read and sign documents such as consent forms, a financial agreement, and acknowledgement forms.  If necessary, and with your consent, a friend or relative is welcome to sit-in on the nurse visit with you.  Please bring your insurance card so that we may make a copy of it.  If your insurance requires a referral to see a specialist, please bring your referral form from your primary care physician.  No co-pay is required for this nurse visit.     If you cannot keep your appointment, please call [phone] to cancel or  reschedule prior to your appointment date.  This allows [place] the opportunity to schedule an appointment for another patient in need of care.    Thank you for choosing [facility] for your medical needs.  We appreciate the opportunity to care for you.  Please visit [place] at our website  to learn more about our practice.  Sincerely, The Gastroenterology Division

## 2010-12-13 ENCOUNTER — Encounter: Payer: Self-pay | Admitting: *Deleted

## 2010-12-15 ENCOUNTER — Encounter: Payer: Self-pay | Admitting: Gastroenterology

## 2010-12-15 ENCOUNTER — Ambulatory Visit (AMBULATORY_SURGERY_CENTER): Payer: Medicare Other | Admitting: *Deleted

## 2010-12-15 VITALS — Ht 63.0 in | Wt 143.0 lb

## 2010-12-15 DIAGNOSIS — Z86010 Personal history of colonic polyps: Secondary | ICD-10-CM

## 2010-12-15 MED ORDER — PEG-KCL-NACL-NASULF-NA ASC-C 100 G PO SOLR: 1.0000 | Freq: Once | ORAL | Status: AC

## 2010-12-29 ENCOUNTER — Other Ambulatory Visit: Payer: Self-pay | Admitting: Gastroenterology

## 2011-02-01 NOTE — Assessment & Plan Note (Signed)
[facility]                         ELECTROPHYSIOLOGY OFFICE NOTE   NAME:Marissa Hart, Marissa Hart                     MRN:          [id number]  DATE:08/01/2008                            DOB:          October 02, 1934    [name] returns today for followup.  She is a very pleasant woman  with a history of SVT, status post ablation, now with recurrent  palpitations which are intermittent, but are bothersome to her and maker  feel dizzy.  She returns today for followup.  She wore cardiac monitor,  which demonstrated PACs.  The patient had no specific chest pain.  She  does get dizzy when she has episodes of palpitations.   CURRENT MEDICATIONS:  1. Multiple vitamins including B12 and vitamin C.  2. Niacin 500 a day.  3. She takes fish oil and folic acid.  4. She on Synthroid 80 mcg daily.   PHYSICAL EXAMINATION:  GENERAL:  She is a pleasant, well-appearing woman  in no distress.  VITAL SIGNS:  Blood pressure 106/82, pulse 64 and regular, respirations  are 18,  and weight is 143 pounds.  NECK:  No jugular venous distention.  LUNGS:  Clear bilaterally auscultation.  No wheezes, rales, or rhonchi  are present.  There is no increased work of breathing.  CARDIOVASCULAR:  Regular rate and rhythm with normal S1 and S2.  PMI is  not enlarged or laterally displaced.  ABDOMINAL:  Soft and nontender.  EXTREMITIES:  No edema.  NEUROLOGIC:  Nonfocal.  Alert and oriented x3 with cranial nerves  intact.   Her EKG demonstrates sinus rhythm with a decreased voltage QRS.  Lab  report today demonstrates TSH of 1.17.  This is down from 4.56 prior to  her up titration of Synthroid.   IMPRESSION:  1. Symptomatic palpitations.  2. Documented premature atrial contractions.  3. History of supraventricular tachycardia, status post ablation.  4. Thyroid dysfunction.   DISCUSSION:  [name] was stable, but continues to have symptomatic  palpitations associated with  dizziness.  I have given her a prescription  today for Lopressor and asked to take on a p.r.n. basis.  The patient's  TSH is improved.  We will have to watch very closely as  increase in her amount of Synthroid could be erratically increase her  likelihood of palpitations  and would not want to make these worse if we do not need to.  I will see  the patient back in 6 months.     [name]. [name], MD  Electronically Signed    GWT/MedQ  DD: 08/01/2008  DT: 08/01/2008  Job #: [id number]

## 2011-02-01 NOTE — Assessment & Plan Note (Signed)
[facility]                         ELECTROPHYSIOLOGY OFFICE NOTE   NAME:Hart, Marissa CARNERO                     MRN:          [id number]  DATE:07/20/2007                            DOB:          17-Dec-1934    Marissa Hart is self-referred today.  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  She has been well for over six years but  in the last month, she has had recurrent episodes of palpitations.  She  characterizes these as sometimes feeling like her heart is skipping  beats and irregular, sometimes very rapid and regular heart racing has  been experienced.  Thes episodes are associated with shortness of breath  and dizziness, but she has had no frank syncope.  She has mild chest  pressure with these.  Otherwise, the patient had no specific complaints  today.  She has had no syncope.  She denies peripheral edema.  Otherwise, her health has been good.   MEDICATIONS:  1. Synthroid 75 mcg daily.  2. Aspirin daily.  3. Niacin 500 mg daily.  4. Multivitamins.   FAMILY HISTORY:  Noncontributory.   SOCIAL HISTORY:  Patient is married.  She denies tobacco or ethanol  abuse.  She denies caffeine excess.   REVIEW OF SYSTEMS:  As noted in the HPI.  She has very mild arthritic  complaints in her knees and hips.  Otherwise, no specific complaints.  Otherwise, the review of systems was unremarkable.   PHYSICAL EXAMINATION:  Her physical exam is notable for her being a  pleasant, well-appearing 76 year old woman in no acute distress.  Blood pressure today was 133/82.  Pulse was 77 and regular.  The  respirations were 18.  Weight was 141 pounds.  NECK:  No jugular venous distention.  LUNGS:  Clear bilaterally to auscultation.  There are no wheezes, rales  or rhonchi present.  There was no increased work of breathing.  CARDIOVASCULAR:  Regular rate and rhythm with a  normal S1 and S2.  The  PMI was not enlarged or laterally displaced.  ABDOMEN:  Soft and nontender.  There is no organomegaly.  EXTREMITIES:  No clubbing, cyanosis or edema.  Pulses are 2+ and  symmetric.  NEUROLOGIC:  Alert and oriented x3.  Cranial nerves are intact.   EKG demonstrates sinus rhythm with normal axis and intervals.   IMPRESSION:  1. History of supraventricular tachycardia, status post catheter      ablation.  2. Recurrent palpitations with no documented arrhythmias.   DISCUSSION:  The etiology of Marissa Hart's symptoms is unclear.  Specifically, whether or not she has recurrence of AV node reentry  tachycardia or whether or not she has atrial fib or atrial flutter  present.  To this end, I have recommended the patient undergo a period  of watchful waiting if she has recurrent episodes of palpitations, and I  would recommend that we have her wear a 3-4 week cardiac monitor.  It  would be important to document her arrhythmias.  Because these episodes  have  been very infrequent until recently, I have asked that she have  recurrent symptoms before we make her wear the monitor.  I will plan to  see the patient back in the office on a p.r.n. basis.     [name]. [name], MD  Electronically Signed    GWT/MedQ  DD: 07/20/2007  DT: 07/21/2007  Job #: [id number]   cc:   Lonzo Cloud. Kriste Basque, MD

## 2011-02-04 NOTE — Discharge Summary (Signed)
[place]. [facility]  Patient:    Marissa Hart, Marissa Hart                       MRN: [id number] Adm. Date:  01/06/00 Disc. Date: 01/07/00 Attending:  Rudene Christians. [name], M.D. [facility] Dictator:   Gene Serpe, P.A. CC:         Lonzo Cloud. Kriste Basque, M.D. [facility]                           Discharge Summary  PROCEDURES:  EPS/RFCA January 06, 2000.  REASON FOR ADMISSION:  The patient is a 76 year old female, with history of MVP and hypothyroidism, who was referred to Dr. Rosette Reveal for evaluation of recurrent tachy palpitations.  He recommended a 30-day event recorder, which documented SVT at approximately 130 BPM.  Following review of treatment options, the patient agreed to proceed with APS and possible RFA.  LABORATORY DATA:  Metabolic profile normal.  CBC normal.  PTT normal.  HOSPITAL COURSE:  The patient underwent elective electrophysiology study on January 06, 2000 by Dr. Rosette Reveal without complications.  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  Dr. [name] proceeded with successful RF ablation leading to no inducible SVT following the procedure.  The patient was cleared for discharge the following morning, maintaining NSR/sinus bradycardia.  FINAL MEDICATION RECOMMENDATIONS:  Discontinue digoxin and Tenormin.  DISCHARGE MEDICATIONS: 1. Synthroid 0.075 mg q.d. 2. Vitamins as previously directed.  DISCHARGE INSTRUCTIONS:  The patient is to refrain from any heavy lifting for 3-4 days and to refrain from driving until this coming Monday.  She is to call the office if there is any swelling/bleeding from the catheterization sites. The patient is to discontinue taking digoxin/Tenormin.  FOLLOW-UP:  The patient is instructed to schedule a followup appointment with Dr. Rosette Reveal in six weeks.  DISCHARGE DIAGNOSES: 1. Recurrent tachy palpitations/documented supraventricular tachycardia.    a. Status post successful  radiofrequency current ablation--January 06, 2000. 2. Treated hypothyroidism. 3. History of mitral valve prolapse. DD:  01/07/00 TD:  01/07/00 Job: [id number]

## 2011-02-04 NOTE — Op Note (Signed)
[facility]  Patient:    Marissa Hart, Marissa Hart Visit Number: [id number] MRN: [id number]          Service Type: SUR Location: 4W [id number] [id number] Attending Physician:  Skip Mayer Dictated by:   Georges Lynch Darrelyn Hillock, M.D. Proc. Date: 09/13/01 Admit Date:  09/13/2001   CC:         Windy Fast A. Darrelyn Hillock, M.D.                           Operative Report  SURGEON:  Georges Lynch. Darrelyn Hillock, M.D.  ASSISTANT:  Nurse.  PREOPERATIVE DIAGNOSES: 1. Severe impingement syndrome, left shoulder. 2. Partial tear of the rotator cuff tendon, left shoulder.  POSTOPERATIVE DIAGNOSES: 1. Severe impingement syndrome, left shoulder. 2. Partial tear of the rotator cuff tendon, left shoulder.  OPERATION: 1. Open partial acromionectomy and acromioplasty, left shoulder. 2. Repair of a longitudinal tear of the rotator cuff tendon, left shoulder.  DESCRIPTION OF PROCEDURE:  Under general anesthesia, a routine orthopedic prep and drape in the left shoulder is carried out. Note, she had one gram of IV Ancef preoperatively. Also preoperatively she had an interscalene block. At this time, _______ incision was made over the anterior aspect left shoulder. Bleeder was identified and cauterized. I then went down and inserted a self-retaining retractor and I dissected the deltoid tendon by sharp dissection off the acromion. I split the proximal portion of the deltoid and noted she had severe thickness and downsloping of her acromion. It was literally embedding into her rotator cuff. I first protected the cuff with the Bennett retractor and utilized the oscillating saw and did a partial acromionectomy. I then utilized the burr to even the area out. Once this was done, I then I then excised the subdeltoid bursa. There were some bleeders up proximally. I cauterized those. I then went down and identified a longitudinal tear and a rotator cuff. A few sutures then were utilized to suture that in place.  No anchors were necessary. I thoroughly irrigated out the area. Good hemostasis was maintained. I inserted some thrombin-soaked Gelfoam up into the superior aspect of the cuff. Also, I bone waxed the end of the acromion. I then reapproximated the deltoid tendon and muscle in the usual fashion. Subcutaneous was closed with 0 Vicryl. Skin was closed with metal stapes and a sterile Neosporin dressing was applied. She was placed in a sling. Dictated by:   Georges Lynch Darrelyn Hillock, M.D. Attending Physician:  Skip Mayer DD:  09/13/01 TD:  09/13/01 Job: [id number]

## 2011-02-04 NOTE — Procedures (Signed)
[place]. [facility]  Patient:    Marissa Hart, Marissa Hart                       MRN: [id number] Proc. Date: 01/06/00 Adm. Date:  [id number] Disc. Date: 01/06/00 Attending:  Lewayne Bunting CC:         Lonzo Cloud. Kriste Basque, M.D. [facility]             Kelli Hope, [facility]                           Procedure Report  PROCEDURE PERFORMED:   Electrophysiology study and radiofrequency catheter ablation.  REFERRING PHYSICIAN:  Lonzo Cloud. Kriste Basque, M.D.  INDICATIONS:  The patient is a very pleasant 76 year old woman, who has a history of palpitations for several years.  She notes that these typically occur suddenly and stop suddenly.  She recently wore a Holter monitor, which demonstrated a narrow complex supraventricular tachycardia at a rate of 130 beats per minute.   She has been treated in the past with beta blockers, but continues to have symptoms and is subsequently referred for a electrophysiology study and catheter ablation.  DESCRIPTION OF PROCEDURE:  After informed consent was obtained, the patient was taken to the diagnostic EP Lab in the fasting state.  After the usual preparation and draping, intravenous fentanyl and midazolam were given for conscious sedation. A 6 French hexapolar catheter was inserted percutaneously in the right jugular ein and advanced to the coronary sinus.  A 5 French quadripolar catheter was inserted percutaneously in the right femoral vein and advanced to the RV apex.  A 5 French quadripolar catheter was inserted percutaneously in the right femoral vein and advanced to the His bundle region.  A 5 French quadripolar catheter was inserted percutaneously in the right femoral vein and advanced to the high right atrium.  After measurement of the basic intervals, rapid ventricular pacing was carried ut from the RV apex at a pacing cycle length of 490 msec.  This resulted in the initiation of a narrow complex supraventricular tachycardia at a  rate of approximately 110 beats per minute.  The tachycardia was characterized by a very short V-A interval.  At the time of His bundle refractions did not pre-excite the atrium.  The tachycardia could be easily terminated with ventricular pacing. During programed ventricular stimulation, repeated episodes of tachycardia were  induced and the program ventricular stimulation protocol was discontinued. Rapid atrial pacing was then carried out from the high right atrium at a basic drive cycle length of 606 msec.  The S1-S2 interval were stepwise decreased down to 540 msec.  At this location there was marked AH jump with an echo beat.  The patient had multiple AH jumps and the S1-S2 interval were stepwise decreased down to 320 msec where the ERP of the AV node was observed.  Isoproterenol was then initiated and tachycardia could easily be re-induced with at a rate of 140 to 50 beats per minute.  At this point the ablation catheter was maneuvered into the Kochs triangle.  A total of 4 RF energy applications were delivered for a total of 126 seconds to sites six through nine in Kochs triangle.  There was excellerated junctional rhythm during RF energy application.  Following the final RF energy application, rapid ventricular pacing was then carried out and this time there as no inducible SVT.  During the final RV energy application, the patient  did have an intra-atrial reentrant tachycardia at a cycle length of 343 msec which was nonsustained and which was characterized rather by gradual PR prolongation with  continuation of the tachycardia.  The cycle length of this intra-atrial reentrant tachycardia was 343 msec.  Additional rapid atrial pacing was then carried out along with programed atrial stimulation from the high right atrium, and this demonstrated no evidence of residual dual AV nodal physiology as there was no residual AH jump and the P-R interval remained less the R-R  interval. Isoproterenol was discontinued and the patient was observed for approximately 40 minutes.  Additional rapid atrial pacing and rapid ventricular pacing was then carried out and again there was no evidence of residual dual AV nodal physiology and no inducible SVT.  At this point the catheters were removed.  Hemostasis assured and the patient returned to her room in good condition.  COMPLICATIONS:  None.  RESULTS: 1. Baseline ECG demonstrated normal sinus rhythm with normal    normal axis and intervals. 2. Baseline line intervals:  The sinus node cycle length was 990 msec.    The A-H interval 95 msec, H-V interval 36 msec.  The QRS duration 88    msec.  Following RV energy application, the sinus node cycle length    was 11,012 msec.  The H-V interval was 45 msec.  The A-H interval 101    msec and the QRS duration 82 msec. 3. Rapid atrial pacing:  Rapid atrial was carried out from the high right    atrium and this demonstrated an AV Wenckebach cycle length of 590 msec.    It should be noted that on Isuprel the AV Wenckebach cycle length was    410 msec. 4. Programmed atrial stimulation:  Programmed atrial stimulation was carried out    from the high right atrium at a basic dry cycle length of 700 msec.    The S1-S2 interval was stepwise decreased down to 540 msec where there was a  marked AH jump.  There was also echo beats.  The S1-S2 intervals continued to    be decreased down to an S1-S2 interval of rather 700/390 and this demonstrated    the AV node ERP of the slow pathway. 5. Rapid ventricular pacing:  Rapid ventricular pacing was carried out from the  RV apex at a paced cycle length of 500 msec and stepwise decreased down to    370 msec where VA Wenckebach was observed.  It should be noted that during    rapid ventricular pacing, the patient had easily inducible AV and RT. 6. Programed ventricular stimulation:  Programed ventricular stimulation was    carried out  from the RV apex at a base drive cycle length of 409 msec.  An    S1-S2 interval of 500/430 there was easily inducible SVT.  Additional attempts     to determine the retrograde AV node ERP were unsuccessful as the patient    continued to have AV and RT initiated. 7. Arrhythmia observed:  AV and RT.  Initiation rapid ventricular pacing    and spontaneous duration sustained.  Termination, rapid ventricular    pacing. 8. Cycle length:    a. A 560 msec/483 msec on Isuprel.    b. Intra-atrial reentrant tachycardia initiation, rapid atrial pacing       following RF catheter ablation and on isoproterenol.  Duration nonsustained.       Termination spontaneous.  Cycle length 343 msec. 9. RF energy application:  A total of four RF energy applications delivered for a    total of 126 seconds were delivered to the sites six through nine in Kochs    triangle.  This resulted in the creation of accelerated junctional rhythm.  RESULTS AND CONCLUSION:  This study demonstrates successful catheter ablation of easily inducible AV nodal reentry tachycardia with a total of four RF energy  applications.  Following RF energy applications there was no evidence of residual slow pathway conduction or any evidence of inducible tachycardia.  There was nonsustained intra-atrial reentrant tachycardia present during isoproterenol infusion, which did not appear to be the patients clinical arrhythmia. DD:  01/06/00 TD:  01/06/00 Job: [id number]

## 2011-10-03 ENCOUNTER — Telehealth: Payer: Self-pay | Admitting: Pulmonary Disease

## 2011-10-03 DIAGNOSIS — F411 Generalized anxiety disorder: Secondary | ICD-10-CM

## 2011-10-03 DIAGNOSIS — E039 Hypothyroidism, unspecified: Secondary | ICD-10-CM

## 2011-10-03 DIAGNOSIS — D126 Benign neoplasm of colon, unspecified: Secondary | ICD-10-CM

## 2011-10-03 DIAGNOSIS — Z862 Personal history of diseases of the blood and blood-forming organs and certain disorders involving the immune mechanism: Secondary | ICD-10-CM

## 2011-10-03 DIAGNOSIS — E7800 Pure hypercholesterolemia, unspecified: Secondary | ICD-10-CM

## 2011-10-06 NOTE — Telephone Encounter (Signed)
Calling again in reference to previous message can be reached at [phone].Marissa Hart

## 2011-10-07 NOTE — Telephone Encounter (Signed)
Pt called back and was informed by Nicholos Johns that her labs are in the computer for her.  Pt voiced her understanding of this.

## 2011-10-07 NOTE — Telephone Encounter (Signed)
Pt called back again. Per leigh I advised her that labs will be put in today and she may come in anytime next week to have blood drawn. Marissa Hart

## 2011-10-11 ENCOUNTER — Other Ambulatory Visit (INDEPENDENT_AMBULATORY_CARE_PROVIDER_SITE_OTHER): Payer: Medicare Other

## 2011-10-11 DIAGNOSIS — D126 Benign neoplasm of colon, unspecified: Secondary | ICD-10-CM

## 2011-10-11 DIAGNOSIS — Z862 Personal history of diseases of the blood and blood-forming organs and certain disorders involving the immune mechanism: Secondary | ICD-10-CM

## 2011-10-11 DIAGNOSIS — Z8639 Personal history of other endocrine, nutritional and metabolic disease: Secondary | ICD-10-CM

## 2011-10-11 DIAGNOSIS — F411 Generalized anxiety disorder: Secondary | ICD-10-CM

## 2011-10-11 DIAGNOSIS — E7800 Pure hypercholesterolemia, unspecified: Secondary | ICD-10-CM

## 2011-10-11 DIAGNOSIS — E039 Hypothyroidism, unspecified: Secondary | ICD-10-CM

## 2011-10-11 LAB — LIPID PANEL
LDL Cholesterol: 120 mg/dL — ABNORMAL HIGH (ref 0–99)
Total CHOL/HDL Ratio: 4

## 2011-10-11 LAB — CBC WITH DIFFERENTIAL/PLATELET
Basophils Relative: 0.6 % (ref 0.0–3.0)
Eosinophils Absolute: 0.2 10*3/uL (ref 0.0–0.7)
MCHC: 34.5 g/dL (ref 30.0–36.0)
MCV: 98.2 fl (ref 78.0–100.0)
Monocytes Absolute: 0.5 10*3/uL (ref 0.1–1.0)
Neutrophils Relative %: 57.3 % (ref 43.0–77.0)
Platelets: 196 10*3/uL (ref 150.0–400.0)
RBC: 3.96 Mil/uL (ref 3.87–5.11)

## 2011-10-11 LAB — URINALYSIS
Bilirubin Urine: NEGATIVE
Hgb urine dipstick: NEGATIVE
Nitrite: NEGATIVE
Total Protein, Urine: NEGATIVE

## 2011-10-11 LAB — BASIC METABOLIC PANEL
BUN: 19 mg/dL (ref 6–23)
CO2: 28 mEq/L (ref 19–32)
Chloride: 106 mEq/L (ref 96–112)
Creatinine, Ser: 0.7 mg/dL (ref 0.4–1.2)
Glucose, Bld: 81 mg/dL (ref 70–99)

## 2011-10-11 LAB — HEPATIC FUNCTION PANEL
Alkaline Phosphatase: 107 U/L (ref 39–117)
Bilirubin, Direct: 0.1 mg/dL (ref 0.0–0.3)
Total Bilirubin: 0.4 mg/dL (ref 0.3–1.2)

## 2011-10-18 ENCOUNTER — Ambulatory Visit (INDEPENDENT_AMBULATORY_CARE_PROVIDER_SITE_OTHER)
Admission: RE | Admit: 2011-10-18 | Discharge: 2011-10-18 | Disposition: A | Discharge status: Home / Self Care | Payer: Medicare Other | Source: Ambulatory Visit | Attending: Pulmonary Disease | Admitting: Pulmonary Disease

## 2011-10-18 ENCOUNTER — Encounter: Payer: Self-pay | Admitting: Pulmonary Disease

## 2011-10-18 ENCOUNTER — Ambulatory Visit (INDEPENDENT_AMBULATORY_CARE_PROVIDER_SITE_OTHER): Payer: Medicare Other | Admitting: Pulmonary Disease

## 2011-10-18 DIAGNOSIS — E7800 Pure hypercholesterolemia, unspecified: Secondary | ICD-10-CM

## 2011-10-18 DIAGNOSIS — I498 Other specified cardiac arrhythmias: Secondary | ICD-10-CM

## 2011-10-18 DIAGNOSIS — E039 Hypothyroidism, unspecified: Secondary | ICD-10-CM

## 2011-10-18 DIAGNOSIS — M1990 Unspecified osteoarthritis, unspecified site: Secondary | ICD-10-CM

## 2011-10-18 DIAGNOSIS — D126 Benign neoplasm of colon, unspecified: Secondary | ICD-10-CM

## 2011-10-18 DIAGNOSIS — I059 Rheumatic mitral valve disease, unspecified: Secondary | ICD-10-CM

## 2011-10-18 DIAGNOSIS — K5730 Diverticulosis of large intestine without perforation or abscess without bleeding: Secondary | ICD-10-CM

## 2011-10-18 DIAGNOSIS — F411 Generalized anxiety disorder: Secondary | ICD-10-CM

## 2011-10-18 DIAGNOSIS — K449 Diaphragmatic hernia without obstruction or gangrene: Secondary | ICD-10-CM

## 2011-10-18 HISTORY — DX: Unspecified osteoarthritis, unspecified site: M19.90

## 2011-10-18 MED ORDER — LEVOTHYROXINE SODIUM 88 MCG PO TABS: 88.0000 ug | ORAL_TABLET | Freq: Every day | ORAL | Status: DC

## 2011-10-18 NOTE — Progress Notes (Signed)
Subjective:     Patient ID: Tennis Ship, female   DOB: [birth year], 77 y.o.   MRN: [id number]  HPI 77 y/o WF here for a follow up visit... he has multiple medical problems as noted below...    ~  Nov09:  seen 10/08 c/o decr energy and wanted her Synthroid adjusted- changed from 75 to 102mcg/d w/ f/u TSH 11/09 = 1.17 and she feels better and is much happier... she has done some internet research and she wants to keep this level in the 1-2 range...  she takes mult herbal and vitamin preps from [employer]- CoQ10, Grape seed extract, Red yeast rice, MVI, Garlic, & Folate...  ~  Jun10:  she notes aching/ sore all over and blames the Pravachol40 ("I hurt, and can't move as fast on the tennis court")... she stopped it for 1 month without change in her symptoms, but she still wants off the [facility]...  She developed a rash & was seen by DrGould w/ Dx of "granuloma annulaire" & treated w/ Dapsone (she notes they did blood work & reports that it was normal>  subseq tells me this med affected her liver & her blood count & was DC'd). ~  Nov10:  she notes some recent sinus symptoms & is using saline nasal spray... she feels that she is having a problem w/ her HH- c/o reflux symptoms & epig discomfort & would like a f/u appt w/ GI, DrPatterson (she never went) & in the interim change her Prn Pepcid to PROTONIX 40mg  Qam & Pepcid 20mg Qhs... she had a f/u visit w/ DrTaylor 8/10- hx SVT improved w/ incr Metoprolol to 1/2 tab Qhs, but she credits stopping Aleve ("people who take alot of Aleve get heart problems" she say).  ~  October 13, 2010:  15mo ROV- under incr stress w/ husb illness, LBP (VAH & DrRamos)... she had abn mammogram w/ benign bx 11/11 & f/u planned 74mo... she's had some palpit but improved off Aleve rx she says & advised decr caffeine etc... she is overdue for f/u w/ GI DrPatterson but declines to let us set this up for her... she had labs 11/11- reviewed w/ pt; she requests 90d refill rx's.  ~   October 18, 2011:  Yearly ROV & she reports a good yr other than incr stress w/ Herb's illnesses (mild stoke- aggrenox, then GI bleed- stopped aggrenox, continued LBP & only taking ASA81 for stroke prophylaxis now)...  She notes some nasal congestion/ pressure> rec to take Mucinex, Fluids, Nasal saline... Reviewed meds> lots of vits, supplements & 2 Rx= Vivelle & Synthroid...    She denies CP, palpit, dizzy, SOB, edema; states palpit are decr since stopping Aleve...     Had mammograms, MRI, breast bx 2012> all benign...    Chol is similar on supplements RYR, FishOil> she prefers to continue diet management...    Thyroid is stable on the Synthroid88 & she is happy w/ the TSH down to 0.82    GI is stable & asymptomatic off meds> states insurance wouldn't cover Colon check x in 82yrs from prev (ie- 2016) even w/ adenoma removed 2003...    She had arthroscopic right knee surg from [facility] & feels she is improved...          Problem List:  CHEST WALL PAIN, HX OF (ICD-V15.89) - she takes ASA 81mg /d... ~  11/10: she request hs-CRP test & we discussed this... it ret elevated= 6.4 & she will repeat it & discuss poss of  further cardiac eval w/ DrTaylor... ~  CXR 11/10 showed normal heart size & clear lungs... ~  CXR 1/13 showed clear lungs, normal heart size, NAD.[name] KitchenMarland Kitchen  ? of MITRAL VALVE PROLAPSE (ICD-424.0) - baseline EKG is WNL.[name] Kitchen. can't find prev 2DEcho (old chart in [facility] office)... currently denies CP or palpitations...  SUPRAVENTRICULAR TACHYCARDIA (ICD-427.89) - hx SVT w/ electrophysiologic studies by DrTaylor in 2001 w/ successful RF ablation... he placed her on Metoprolol 25mg  Qhs but she has since stopped this on her own. ~  8/10:  f/u note from DrTaylor reviewed. ~  she states palpit have decreased since she stopped Aleve.  HYPERCHOLESTEROLEMIA (ICD-272.0) - hx of significantly elevated cholesterol values- she has refused Med Rx and tries to control this w/ diet + Fish Oil + Red Yeast Rice,  etc. ~  Her best FLP over the last 21yrs was 3/03 w/ TChol 204, TG 93, HDL 54, LDL 132 ~  FLP 10/08 showed TChol 245, TG 108, HDL 43, LDL 175... tried Zocor- stopped due to rash. ~  FLP 12/09 showed TChol 228, TG 108, HDL 35, LDL 161... rec> try Prav40/d. ~  FLP 6/10 on Prav40 showed TChol 167, TG 98, HDL 39, LDL 108... she wants off PravRx. ~  11/10 she is off the Prav40- & notes that "cholesterol is not as big a deal as they previously thought"...      FLP 11/10 showed TChol 210, TG 157, HDL 41, LDL 139... pt offered [facility] appt & she will think about it. ~  FLP 11/11 showed TChol 183, TG 100, HDL 41, LDL 122 ~  FLP 1/13 on diet alone showed TChol 183, TG 83, HDL 47, LDL 120... She is happy w/ these numbers 7 will continue diet + supplements.  HYPOTHYROIDISM (ICD-244.9) - on SYNTHROID 48mcg/d now... ~  labs 11/09 showed TSH= 1.17, and she wants to keep it betw 1-2... c/o cost of Synthroid, therefore we will switch her to generic Levothyroid 17mcg/d and recheck TSH. ~  labs 2/10 showed TSH= 0.82 ~  labs 11/10 showed TSH= 1.07 ~  labs 11/11 showed TSH= 1.44 ~  Labs 1/13 showed TSH= 0.82 & this is were she wants her TSH to be "I was having trouble when my TSH was >3"...  HIATAL HERNIA (ICD-553.3) - EGD 1998 showed smallHH, min esophagitis... she uses Pepcid Prn... ~  11/10: pt notes incr reflux symptoms and epig discomfort- started on PROTONIX 40Qam & Pepcid Qhs... sl elevation of SGOT=56, SGPT=65> check AbdSonar & refered to GI for eval...  DIVERTICULOSIS OF COLON (ICD-562.10),  COLONIC POLYPS (ICD-211.3) - followed for GI by DrPatterson w/ last colonoscopy 4/06- divertics only & f/u planned 2yrs... last polyp removed 4/03= 12mm tubular adenoma... ~  1/12:  she isoverdue for f/u colonoscopy but wants to call herself to set this up.  RENAL CYST, RIGHT (ICD-593.2) - small benign right renal cyst seen on ultrasound in 1998...  DJD >> she had right knee arthroscopy by DrGioffre (we don't  have note & done prior to Epic)...  FIBROMYALGIA (ICD-729.1) - she saw DrTruslow in 1997 for polyarthralgias and prob fibromylagia... treated w/ Flexeril at that time...  ~  11/10: she has several trigger points but doesn't believe that she has FM...  ANXIETY (ICD-300.00) - some stress caring for husb...  Hx of ERYSIPELAS (ICD-035) - left facial erysipelas in 2004 responded to Keflex...  Health Maintenance -  GYN= DrNeal on [facility] dot... she [facility] her Mamm[facility] at [facility] and had a BMD  here in 2003 which was WNL, since then f/u BMD screening at [facility] office & stable... Vit D level here 11/09 was 50... she takes MVI + Vit D 1000 u daily...  ~  Immunizations:  she had the 2010 flu vaccine 10/10.   Past Surgical History  Procedure Date  . Abdominal hysterectomy   . Oophorectomy   . Breast biopsie     benign lt. breast  . Colonoscopy   . Colonoscopy   . Upper gastrointestinal endoscopy   . Esophagogastroduodenoscopy   . Cataract extraction bilateral w/ anterior vitrectomy   . Rotator cuff repair 08/2001    Dr. Darrelyn Hillock  . Breast biopsiy     2011-benign    Outpatient Encounter Prescriptions as of 10/18/2011  Medication Sig Dispense Refill  . aspirin 81 MG tablet Take 81 mg by mouth daily.        . Cholecalciferol (VITAMIN D) 1000 UNITS capsule Take 1,000 Units by mouth daily.        [name] Kitchen docusate sodium (COLACE) 100 MG capsule Take 100 mg by mouth daily. Takes 3 daily       . estradiol (VIVELLE-DOT) 0.05 MG/24HR Place 1 patch onto the skin 2 (two) times a week.        . fish oil-omega-3 fatty acids 1000 MG capsule Take 2 g by mouth daily.        [name] Kitchen levothyroxine (SYNTHROID, LEVOTHROID) 88 MCG tablet Take 88 mcg by mouth daily.        . Multiple Vitamins-Minerals (MULTIVITAMIN WITH MINERALS) tablet Take 1 tablet by mouth daily.        . psyllium (METAMUCIL) 58.6 % powder Take 1 packet by mouth daily.        . Red Yeast Rice 600 MG CAPS Take 1 capsule by mouth daily.      . vitamin B-12  (CYANOCOBALAMIN) 1000 MCG tablet Take 1,000 mcg by mouth daily. Takes liquid form       . vitamin C (ASCORBIC ACID) 500 MG tablet Take 500 mg by mouth daily.      [name] Kitchen DISCONTD: sodium chloride 0.9 % SOLN 50 mL with pyridOXINE 100 MG/ML SOLN Inject into the vein daily.         Allergies  Allergen Reactions  . Codeine     REACTION: nausea/dizziness  . Codeine Phosphate     REACTION: nausea,dizzy  . Tramadol Hcl     REACTION: nausea--dizzy    Current Medications, Allergies, Past Medical History, Past Surgical History, Family History, and Social History were reviewed in [employer] record.    Review of Systems    The patient denies fever, chills, sweats, anorexia, fatigue, weakness, malaise, weight loss, sleep disorder, blurring, diplopia, eye irritation, eye discharge, vision loss, eye pain, photophobia, earache, ear discharge, tinnitus, decreased hearing, nasal congestion, nosebleeds, sore throat, hoarseness, chest pain, palpitations, syncope, dyspnea on exertion, orthopnea, PND, peripheral edema, cough, dyspnea at rest, excessive sputum, hemoptysis, wheezing, pleurisy, nausea, vomiting, diarrhea, constipation, change in bowel habits, abdominal pain, melena, hematochezia, jaundice, gas/bloating, indigestion/heartburn, dysphagia, odynophagia, dysuria, hematuria, urinary frequency, urinary hesitancy, nocturia, incontinence, back pain, joint pain, joint swelling, muscle cramps, muscle weakness, stiffness, arthritis, sciatica, restless legs, leg pain at night, leg pain with exertion, rash, itching, dryness, suspicious lesions, paralysis, paresthesias, seizures, tremors, vertigo, transient blindness, frequent falls, frequent headaches, difficulty walking, depression, anxiety, memory loss, confusion, cold intolerance, heat intolerance, polydipsia, polyphagia, polyuria, unusual weight change, abnormal bruising, bleeding, enlarged lymph nodes, urticaria, allergic rash, hay fever,  and  recurrent infections.     Objective:   Physical Exam    WD, WN, 77 y/o WF in NAD... GENERAL:  Alert & oriented; pleasant & cooperative... HEENT:  [place]/AT, EOM-wnl, PERRLA, EACs-clear, TMs-wnl, NOSE-clear, THROAT-clear & wnl. NECK:  Supple w/ fairROM; no JVD; normal carotid impulses w/o bruits; no thyromegaly or nodules palpated; no lymphadenopathy. CHEST:  Clear to P & A; without wheezes/ rales/ or rhonchi. HEART:  Regular Rhythm; without murmurs/ rubs/ or gallops. ABDOMEN:  Soft & nontender; normal bowel sounds; no organomegaly or masses detected. EXT: without deformities, mild arthritic changes; no varicose veins/ venous insuffic/ or edema. NEURO:  no focal neuro deficits... DERM:  fine macular/papular rash along upper back, neck and under later axilla, no sign redness.   RADIOLOGY DATA:  Reviewed in the EPIC EMR & discussed w/ the patient...    >>CXR 1/13 shows clear lungs, normal heart size, NAD...  LABORATORY DATA:  Reviewed in the EPIC EMR & discussed w/ the patient...    >>LABS reviewed...   Assessment:     Hx CWP in the past, ?MVP, Hx SVT>  She denies recurrent CP & notes that palpit are diminished off aleve rx...  CHOL>  FLP w/ LDL ~120 & she is content w/ this level of control on diet alone; she declines meds rx...  HYPOTHYROID>  TSH is stable on Synthroid 81mcg/d (doesn't want generic); she notes that she wants to keep the TSH low because she feels better at these levels...  GI> HH, Divertics, Polyps>  She notes that insurance won't cover colonoscopy x every 45yrs...  DJD>  She had right knee arthroscopy by DrGioffre...  Anxiety> stress caring for Herg; state 3 friends w/ spots on their lungs (reassured w/ clear CXR today)...     Plan:     Patient's Medications  New Prescriptions   No medications on file  Previous Medications   ASPIRIN 81 MG TABLET    Take 81 mg by mouth daily.     CHOLECALCIFEROL (VITAMIN D) 1000 UNITS CAPSULE    Take 1,000 Units by mouth  daily.     DOCUSATE SODIUM (COLACE) 100 MG CAPSULE    Take 100 mg by mouth daily. Takes 3 daily    ESTRADIOL (VIVELLE-DOT) 0.05 MG/24HR    Place 1 patch onto the skin 2 (two) times a week.     FISH OIL-OMEGA-3 FATTY ACIDS 1000 MG CAPSULE    Take 2 g by mouth daily.     MULTIPLE VITAMINS-MINERALS (MULTIVITAMIN WITH MINERALS) TABLET    Take 1 tablet by mouth daily.     PSYLLIUM (METAMUCIL) 58.6 % POWDER    Take 1 packet by mouth daily.     RED YEAST RICE 600 MG CAPS    Take 1 capsule by mouth daily.   VITAMIN B-12 (CYANOCOBALAMIN) 1000 MCG TABLET    Take 1,000 mcg by mouth daily. Takes liquid form    VITAMIN C (ASCORBIC ACID) 500 MG TABLET    Take 500 mg by mouth daily.  Modified Medications   Modified Medication Previous Medication   LEVOTHYROXINE (SYNTHROID, LEVOTHROID) 88 MCG TABLET levothyroxine (SYNTHROID, LEVOTHROID) 88 MCG tablet      Take 1 tablet (88 mcg total) by mouth daily.    Take 88 mcg by mouth daily.    Discontinued Medications   SODIUM CHLORIDE 0.9 % SOLN 50 ML WITH PYRIDOXINE 100 MG/ML SOLN    Inject into the vein daily.

## 2011-10-18 NOTE — Patient Instructions (Signed)
Today we updated your med list in our EPIC system...    Continue your current medications the same...  Today we did your follow up CXR...    Please call the PHONE TREE in a few days for your results...    Dial [id number] & when prompted enter your patient number followed by the # symbol...    Your patient number is:  [id number]  Call for any questions.[name] KitchenMarland Kitchen

## 2011-10-19 ENCOUNTER — Telehealth: Payer: Self-pay | Admitting: Pulmonary Disease

## 2011-10-19 NOTE — Telephone Encounter (Signed)
I spoke with pt and she states she had a tetanus shot in may 2003. Pt aware this is every 10 years. She states she will call after May to get tetanus shot.

## 2011-11-30 ENCOUNTER — Other Ambulatory Visit: Payer: Self-pay | Admitting: Pulmonary Disease

## 2011-11-30 ENCOUNTER — Ambulatory Visit (INDEPENDENT_AMBULATORY_CARE_PROVIDER_SITE_OTHER): Payer: Medicare Other

## 2011-11-30 DIAGNOSIS — Z23 Encounter for immunization: Secondary | ICD-10-CM

## 2011-11-30 NOTE — Progress Notes (Signed)
Entered in error in orders only no charges dropped will place under office visit

## 2011-12-02 ENCOUNTER — Telehealth: Payer: Self-pay | Admitting: Pulmonary Disease

## 2011-12-02 NOTE — Telephone Encounter (Signed)
Tylenol As needed   Cool compresses  Make sure she has no dyspnea, wheezing or difficulty swallowing or swelling. ?????????????????? May take benadryl As needed   Please contact office for sooner follow up if symptoms do not improve or worsen or seek emergency care

## 2011-12-02 NOTE — Telephone Encounter (Signed)
I spoke with pt and she believes she may be having a reaction to her TDAP injection she received on wed. The spot is raised 2.5' and is hard, red, warm to touch, sore, and it itches. Pt has been taking tylenol every 6 hrs, using cold compresses, and cortisone cream for the itching. Pt stated she has never had a tdap injection and this was the 1st one. I advised pt she could take benadryl. Pt is requesting further recs from SN. Since SN is not in will forward to TP. Please advise tammy, thanks

## 2011-12-02 NOTE — Telephone Encounter (Signed)
I spoke with pt and she denies any dyspnea, wheezing, difficulty swallowing, and swelling. I advised her of TP recs and she voiced her understanding. She is aware if her symptoms worsen then to seek emergency care. Nothing further was needed

## 2012-08-22 ENCOUNTER — Telehealth: Payer: Self-pay | Admitting: Pulmonary Disease

## 2012-08-22 NOTE — Telephone Encounter (Signed)
Will forward to [name] to coordinate labs, thanks

## 2012-08-28 ENCOUNTER — Other Ambulatory Visit: Payer: Self-pay | Admitting: Pulmonary Disease

## 2012-08-28 DIAGNOSIS — E7800 Pure hypercholesterolemia, unspecified: Secondary | ICD-10-CM

## 2012-08-28 DIAGNOSIS — R300 Dysuria: Secondary | ICD-10-CM

## 2012-08-28 DIAGNOSIS — Z862 Personal history of diseases of the blood and blood-forming organs and certain disorders involving the immune mechanism: Secondary | ICD-10-CM

## 2012-08-28 DIAGNOSIS — K5730 Diverticulosis of large intestine without perforation or abscess without bleeding: Secondary | ICD-10-CM

## 2012-08-28 DIAGNOSIS — D126 Benign neoplasm of colon, unspecified: Secondary | ICD-10-CM

## 2012-08-28 DIAGNOSIS — E039 Hypothyroidism, unspecified: Secondary | ICD-10-CM

## 2012-08-28 NOTE — Telephone Encounter (Signed)
I spoke with the pt's spouse and he will let the pt know she can come 1 week prior to her appt on 10/18/12 and she will need to be NPO after midnight. Pt's spouse wrote down instructions and verbalized understanding.

## 2012-08-28 NOTE — Telephone Encounter (Signed)
Order has been placed for the fasting labs per pts request.  Pt will need to come in 1 week prior to her appt on 1-30.  thanks

## 2012-10-11 ENCOUNTER — Other Ambulatory Visit (INDEPENDENT_AMBULATORY_CARE_PROVIDER_SITE_OTHER): Payer: Medicare Other

## 2012-10-11 DIAGNOSIS — E039 Hypothyroidism, unspecified: Secondary | ICD-10-CM

## 2012-10-11 DIAGNOSIS — R300 Dysuria: Secondary | ICD-10-CM

## 2012-10-11 DIAGNOSIS — Z862 Personal history of diseases of the blood and blood-forming organs and certain disorders involving the immune mechanism: Secondary | ICD-10-CM

## 2012-10-11 DIAGNOSIS — Z8639 Personal history of other endocrine, nutritional and metabolic disease: Secondary | ICD-10-CM

## 2012-10-11 DIAGNOSIS — K5730 Diverticulosis of large intestine without perforation or abscess without bleeding: Secondary | ICD-10-CM

## 2012-10-11 DIAGNOSIS — E7800 Pure hypercholesterolemia, unspecified: Secondary | ICD-10-CM

## 2012-10-11 LAB — URINALYSIS
Bilirubin Urine: NEGATIVE
Hgb urine dipstick: NEGATIVE
Leukocytes, UA: NEGATIVE
Nitrite: NEGATIVE
pH: 6 (ref 5.0–8.0)

## 2012-10-11 LAB — CBC WITH DIFFERENTIAL/PLATELET
Basophils Relative: 0.4 % (ref 0.0–3.0)
Eosinophils Relative: 2.8 % (ref 0.0–5.0)
Lymphocytes Relative: 23.4 % (ref 12.0–46.0)
MCV: 96.2 fl (ref 78.0–100.0)
Monocytes Absolute: 0.7 10*3/uL (ref 0.1–1.0)
Neutrophils Relative %: 64.5 % (ref 43.0–77.0)
Platelets: 199 10*3/uL (ref 150.0–400.0)
RBC: 4.27 Mil/uL (ref 3.87–5.11)
WBC: 7.5 10*3/uL (ref 4.5–10.5)

## 2012-10-11 LAB — HEPATIC FUNCTION PANEL
ALT: 21 U/L (ref 0–35)
AST: 20 U/L (ref 0–37)
Albumin: 3.9 g/dL (ref 3.5–5.2)
Total Bilirubin: 0.7 mg/dL (ref 0.3–1.2)
Total Protein: 6.7 g/dL (ref 6.0–8.3)

## 2012-10-11 LAB — TSH: TSH: 1.96 u[IU]/mL (ref 0.35–5.50)

## 2012-10-11 LAB — BASIC METABOLIC PANEL
BUN: 15 mg/dL (ref 6–23)
CO2: 30 mEq/L (ref 19–32)
Chloride: 103 mEq/L (ref 96–112)
Creatinine, Ser: 0.8 mg/dL (ref 0.4–1.2)
Glucose, Bld: 93 mg/dL (ref 70–99)
Potassium: 4.4 mEq/L (ref 3.5–5.1)

## 2012-10-11 LAB — LIPID PANEL
Cholesterol: 193 mg/dL (ref 0–200)
Triglycerides: 156 mg/dL — ABNORMAL HIGH (ref 0.0–149.0)

## 2012-10-18 ENCOUNTER — Encounter: Payer: Self-pay | Admitting: Pulmonary Disease

## 2012-10-18 ENCOUNTER — Ambulatory Visit (INDEPENDENT_AMBULATORY_CARE_PROVIDER_SITE_OTHER): Payer: Medicare Other | Admitting: Pulmonary Disease

## 2012-10-18 VITALS — BP 112/72 | HR 72 | Temp 97.9°F | Ht 63.0 in | Wt 145.8 lb

## 2012-10-18 DIAGNOSIS — F411 Generalized anxiety disorder: Secondary | ICD-10-CM

## 2012-10-18 DIAGNOSIS — K449 Diaphragmatic hernia without obstruction or gangrene: Secondary | ICD-10-CM

## 2012-10-18 DIAGNOSIS — K5730 Diverticulosis of large intestine without perforation or abscess without bleeding: Secondary | ICD-10-CM

## 2012-10-18 DIAGNOSIS — D126 Benign neoplasm of colon, unspecified: Secondary | ICD-10-CM

## 2012-10-18 DIAGNOSIS — E7800 Pure hypercholesterolemia, unspecified: Secondary | ICD-10-CM

## 2012-10-18 DIAGNOSIS — IMO0001 Reserved for inherently not codable concepts without codable children: Secondary | ICD-10-CM

## 2012-10-18 DIAGNOSIS — I059 Rheumatic mitral valve disease, unspecified: Secondary | ICD-10-CM

## 2012-10-18 DIAGNOSIS — M25569 Pain in unspecified knee: Secondary | ICD-10-CM

## 2012-10-18 DIAGNOSIS — E039 Hypothyroidism, unspecified: Secondary | ICD-10-CM

## 2012-10-18 DIAGNOSIS — M1990 Unspecified osteoarthritis, unspecified site: Secondary | ICD-10-CM

## 2012-10-18 DIAGNOSIS — I498 Other specified cardiac arrhythmias: Secondary | ICD-10-CM

## 2012-10-18 DIAGNOSIS — M25561 Pain in right knee: Secondary | ICD-10-CM

## 2012-10-18 MED ORDER — LEVOTHYROXINE SODIUM 88 MCG PO TABS: 88.0000 ug | ORAL_TABLET | Freq: Every day | ORAL | Status: DC

## 2012-10-18 NOTE — Patient Instructions (Addendum)
Today we updated your med list in our EPIC system...    Continue your current medications the same...  We reviewed your recent blood work & gave you a copy for your records...  We will arrange for a Cardiac Electrophysiology consult w/ DrAllred regarding your palpitations and hx of SVT w/ Ablation...  We will also arrange for an appt w/ DrOlin or Alusio at [facility] regarding your right knee...  At your convenience- set up a follow up appt w/ DrPatterson regarding your colonoscopy...  Call for any questions or if we can be of service in any way.[name] KitchenMarland Kitchen

## 2012-10-18 NOTE — Progress Notes (Signed)
Subjective:     Patient ID: Marissa Hart, female   DOB: 04-14-1935, 78 y.o.   MRN: [id number]  HPI 78 y/o WF here for a follow up visit... he has multiple medical problems as noted below...    ~  Nov09:  seen 10/08 c/o decr energy and wanted her Synthroid adjusted- changed from 75 to 37mcg/d w/ f/u TSH 11/09 = 1.17 and she feels better and is much happier... she has done some internet research and she wants to keep this level in the 1-2 range...  she takes mult herbal and vitamin preps from [employer]- CoQ10, Grape seed extract, Red yeast rice, MVI, Garlic, & Folate...  ~  Jun10:  she notes aching/ sore all over and blames the Pravachol40 ("I hurt, and can't move as fast on the Marissa court")... she stopped it for 1 month without change in her symptoms, but she still wants off the [place] [facility]...  She developed a rash & was seen by DrGould w/ Dx of "granuloma annulaire" & treated w/ Dapsone (she notes they did blood work & reports that it was normal>  subseq tells me this med affected her liver & her blood count & was DC'd). ~  Nov10:  she notes some recent sinus symptoms & is using saline nasal spray... she feels that she is having a problem w/ her HH- c/o reflux symptoms & epig discomfort & would like a f/u appt w/ GI, DrPatterson (she never went) & in the interim change her Prn Pepcid to PROTONIX 40mg  Qam & Pepcid 20mg Qhs... she had a f/u visit w/ DrTaylor 8/10- hx SVT improved w/ incr Metoprolol to 1/2 tab Qhs, but she credits stopping Aleve ("people who take alot of Aleve get heart problems" she say).  ~  October 13, 2010:  91mo ROV- under incr stress w/ husb illness, LBP (VAH & DrRamos)... she had abn mammogram w/ benign bx 11/11 & f/u planned 64mo... she's had some palpit but improved off Aleve rx she says & advised decr caffeine etc... she is overdue for f/u w/ GI DrPatterson but declines to let us set this up for her... she had labs 11/11- reviewed w/ pt; she requests 90d refill rx's.  ~   October 18, 2011:  Yearly ROV & she reports a good yr other than incr stress w/ Herb's illnesses (mild stoke- Aggrenox, then GI bleed- stopped Aggrenox, continued LBP & only taking ASA81 for stroke prophylaxis now)...  She notes some nasal congestion/ pressure> rec to take Mucinex, Fluids, Nasal saline... Reviewed meds> lots of vits, supplements & 2 Rx= Vivelle & Synthroid...    She denies CP, palpit, dizzy, SOB, edema; states palpit are decr since stopping Aleve...     Had mammograms, MRI, breast bx 2012> all benign...    Chol is similar on supplements RYR, FishOil> she prefers to continue diet management...    Thyroid is stable on the Synthroid88 & she is happy w/ the TSH down to 0.82    GI is stable & asymptomatic off meds> states insurance wouldn't cover Colon check x in 67yrs from prev (ie- 2016) even w/ adenoma removed 2003...    She had arthroscopic right knee surg from [facility] & feels she is improved...  ~  October 18, 2012:  Yearly ROV & Tyan notes that overall she is doing well but she has several problems/ concerns today:  1) occas palpit & as noted she has hx ?MVP, +SVT w/ ablation in 2001, she was on Metop25 for yrs  but she stopped it on her own, she prev noted that stopping Aleve seemed to help her palpit, she knows to elim caffeine etc;  Exam is neg today- w/o msc & w/o arrhythmias;  She does not want to try a diff BBlocker- requests appt w/ DrAllred for further eval & review.[name] KitchenMarland Kitchen  2) she notes some incr right knee pain & swelling after a recent fall, had to give up Marissa, hx prev right knee arthroscopy;  She would like knee re-eval by DrOlin/ Alusio...     Cards- HxCWP, ?MVP, HxSVT w/ ablation> on ASA81; as above & we will refer to DrAllred for EP consult...    CHOL> on FishOil & RYR; off prev Prav40 & doesn't want to restart statin rx; FLP 1/14 shows TChol 193, TG 156, HDL 38, LDL 123 (she blames recent steak dinner) and does not want med rx for her lipid problem...    HYPOTHYROID> on  Synthroid88; clinically & biochem euthyroid w/ 1/14 TSH= 1.96    GI- HH, Divertics, Polyps> on Colace, Metamucil, Probiotic; followed by DrPatterson w/ 12mm tubular adenoma removed 2003 & subseq neg colon 2006 (x divertics); she is overdue for f/u colon but says insurance won't pay until 2016(?); she is rec to f/u w/ DrPatterson to see what he recommends...    DJD> as above & she would like right knee consultation w/ DrOlin or Alusio...    Anxiety> under stress caring for her 76 y/o husb... We reviewed prob list, meds, xrays and labs> see below for updates >>  LABS 1/14:  FLP- not at goals on diet alone (she declines med rx);  Chems- wnl;  CBC- wnl;  TSH=1.96;  UA- clear...          Problem List:  CHEST WALL PAIN, HX OF (ICD-V15.89) - she takes ASA 81mg /d... ~  11/10: she request hs-CRP test & we discussed this... it ret elevated= 6.4 & she will repeat it & discuss poss of further cardiac eval w/ DrTaylor... ~  CXR 11/10 showed normal heart size & clear lungs... ~  CXR 1/13 showed clear lungs, normal heart size, NAD.[name] KitchenMarland Kitchen  ? of MITRAL VALVE PROLAPSE (ICD-424.0) - baseline EKG is WNL.[name] Kitchen. can't find prev 2DEcho (old chart in [facility] office)...  SUPRAVENTRICULAR TACHYCARDIA (ICD-427.89) - hx SVT w/ electrophysiologic studies by DrTaylor in 2001 w/ successful RF ablation... he placed her on Metoprolol 25mg  Qhs but she has since stopped this on her own. ~  8/10:  f/u note from DrTaylor reviewed. ~  2012:  she states palpit have decreased since she stopped Aleve. ~  1/14:  She is c/o occas palpit- skipping, flutter sensation, self-limited, despite not taking Aleve & avoiding caffeine etc; she would like EP Cards eval from DrAllred...  HYPERCHOLESTEROLEMIA (ICD-272.0) - hx of significantly elevated cholesterol values- she has refused Med Rx and tries to control this w/ diet + Fish Oil + Red Yeast Rice, etc. ~  Her baseline FLP in 3/03 showed TChol 204, TG 93, HDL 54, LDL 132 ~  FLP 10/08 showed TChol  245, TG 108, HDL 43, LDL 175... tried Zocor- stopped due to rash. ~  FLP 12/09 showed TChol 228, TG 108, HDL 35, LDL 161... rec> try Prav40/d. ~  FLP 6/10 on Prav40 showed TChol 167, TG 98, HDL 39, LDL 108... she wants off PravRx. ~  11/10 she is off the Prav40- & notes that "cholesterol is not as big a deal as they previously thought"...      FLP 11/10  showed TChol 210, TG 157, HDL 41, LDL 139... pt offered Lipid Clinic appt & she will think about it. ~  FLP 11/11 showed TChol 183, TG 100, HDL 41, LDL 122... On diet alone. ~  FLP 1/13 on diet alone showed TChol 183, TG 83, HDL 47, LDL 120... She is happy w/ these numbers & will continue diet + supplements. ~  FLP 1/14 on diet alone showed TChol 193, TG 156, HDL 38, LDL 123  HYPOTHYROIDISM (ICD-244.9) - on SYNTHROID 39mcg/d now... ~  labs 11/09 showed TSH= 1.17, and she wants to keep it betw 1-2... c/o cost of Synthroid, therefore we will switch her to generic Levothyroid 31mcg/d and recheck TSH. ~  labs 2/10 showed TSH= 0.82 ~  labs 11/10 showed TSH= 1.07 ~  labs 11/11 showed TSH= 1.44 ~  Labs 1/13 showed TSH= 0.82 & this is were she wants her TSH to be "I was having trouble when my TSH was >3"... ~  Labs 1/14 showed TSH= 1.96  HIATAL HERNIA (ICD-553.3) - EGD 1998 showed smallHH, min esophagitis... she uses Pepcid Prn... ~  11/10: pt notes incr reflux symptoms and epig discomfort- started on PROTONIX 40Qam & Pepcid Qhs... sl elevation of SGOT=56, SGPT=65> check AbdSonar & refered to GI for eval...  DIVERTICULOSIS OF COLON (ICD-562.10),  COLONIC POLYPS (ICD-211.3) - followed for GI by DrPatterson w/ last colonoscopy 4/06- divertics only & f/u planned 61yrs... last polyp removed 4/03= 12mm tubular adenoma... ~  1/12:  she is overdue for f/u colonoscopy but wants to call herself to set this up. ~  2013:  She states that insurance won't pay for f/u colonoscopy x every 10 yrs, asked to discuss w/ drPatterson... ~  1/14:  She again says insurance  won't cover colonoscopy at this time- last colon 2006 w/ divertics only but 12mm tubular adenoma removed 2003; I rec appt w/ DRP to discuss...   RENAL CYST, RIGHT (ICD-593.2) - small benign right renal cyst seen on ultrasound in 1998...  DJD >> she had right knee arthroscopy by DrGioffre (we don't have note & done prior to Epic)... ~  1/14:  She is c/o incr right knee pain after a fall; rec Ortho f/u w/ XRays 7 she would like to see DrOlin or Alusio...  FIBROMYALGIA (ICD-729.1) - she saw DrTruslow in 1997 for polyarthralgias and prob fibromylagia... treated w/ Flexeril at that time...  ~  11/10: she has several trigger points but doesn't believe that she has FM...  ANXIETY (ICD-300.00) - some stress caring for husb...  Hx of ERYSIPELAS (ICD-035) - left facial erysipelas in 2004 responded to Keflex...  Health Maintenance -  GYN= DrNeal on [facility] dot... she [facility] her Mamm[facility] at [facility] and had a BMD here in 2003 which was WNL, since then f/u BMD screening at [facility] office & stable... Vit D level here 11/09 was 50... she takes MVI + Vit D 1000 u daily...  ~  Immunizations:  She gets the yearly seasonal Flu vaccine... TDAP given 3/13...    Past Surgical History  Procedure Date  . Abdominal hysterectomy   . Oophorectomy   . Breast biopsie     benign lt. breast  . Colonoscopy   . Colonoscopy   . Upper gastrointestinal endoscopy   . Esophagogastroduodenoscopy   . Cataract extraction bilateral w/ anterior vitrectomy   . Rotator cuff repair 08/2001    Dr. Darrelyn Hillock  . Breast biopsiy     2011-benign    Outpatient Encounter Prescriptions as of  10/18/2012  Medication Sig Dispense Refill  . aspirin 81 MG tablet Take 81 mg by mouth daily.        . Cholecalciferol (VITAMIN D) 1000 UNITS capsule Take 1,000 Units by mouth daily.        [name] Kitchen docusate sodium (COLACE) 100 MG capsule Take 100 mg by mouth daily. Takes 3 daily       . estradiol (VIVELLE-DOT) 0.05 MG/24HR Place 1 patch onto the skin 2  (two) times a week.        . fish oil-omega-3 fatty acids 1000 MG capsule Take 2 g by mouth daily.        [name] Kitchen levothyroxine (SYNTHROID, LEVOTHROID) 88 MCG tablet Take 1 tablet (88 mcg total) by mouth daily.  90 tablet  3  . Multiple Vitamins-Minerals (MULTIVITAMIN WITH MINERALS) tablet Take 1 tablet by mouth daily.        . psyllium (METAMUCIL) 58.6 % powder Take 1 packet by mouth daily.        . Red Yeast Rice 600 MG CAPS Take 1 capsule by mouth daily.      . vitamin B-12 (CYANOCOBALAMIN) 1000 MCG tablet Take 1,000 mcg by mouth daily. Takes liquid form       . vitamin C (ASCORBIC ACID) 500 MG tablet Take 500 mg by mouth daily.        Allergies  Allergen Reactions  . Codeine     REACTION: nausea/dizziness  . Codeine Phosphate     REACTION: nausea,dizzy  . Tramadol Hcl     REACTION: nausea--dizzy    Current Medications, Allergies, Past Medical History, Past Surgical History, Family History, and Social History were reviewed in [employer] record.    Review of Systems    The patient denies fever, chills, sweats, anorexia, fatigue, weakness, malaise, weight loss, sleep disorder, blurring, diplopia, eye irritation, eye discharge, vision loss, eye pain, photophobia, earache, ear discharge, tinnitus, decreased hearing, nasal congestion, nosebleeds, sore throat, hoarseness, chest pain, palpitations, syncope, dyspnea on exertion, orthopnea, PND, peripheral edema, cough, dyspnea at rest, excessive sputum, hemoptysis, wheezing, pleurisy, nausea, vomiting, diarrhea, constipation, change in bowel habits, abdominal pain, melena, hematochezia, jaundice, gas/bloating, indigestion/heartburn, dysphagia, odynophagia, dysuria, hematuria, urinary frequency, urinary hesitancy, nocturia, incontinence, back pain, joint pain, joint swelling, muscle cramps, muscle weakness, stiffness, arthritis, sciatica, restless legs, leg pain at night, leg pain with exertion, rash, itching, dryness, suspicious  lesions, paralysis, paresthesias, seizures, tremors, vertigo, transient blindness, frequent falls, frequent headaches, difficulty walking, depression, anxiety, memory loss, confusion, cold intolerance, heat intolerance, polydipsia, polyphagia, polyuria, unusual weight change, abnormal bruising, bleeding, enlarged lymph nodes, urticaria, allergic rash, hay fever, and recurrent infections.     Objective:   Physical Exam    WD, WN, 78 y/o WF in NAD... GENERAL:  Alert & oriented; pleasant & cooperative... HEENT:  [place]/AT, EOM-wnl, PERRLA, EACs-clear, TMs-wnl, NOSE-clear, THROAT-clear & wnl. NECK:  Supple w/ fairROM; no JVD; normal carotid impulses w/o bruits; no thyromegaly or nodules palpated; no lymphadenopathy. CHEST:  Clear to P & A; without wheezes/ rales/ or rhonchi. HEART:  Regular Rhythm; without murmurs/ rubs/ or gallops. ABDOMEN:  Soft & nontender; normal bowel sounds; no organomegaly or masses detected. EXT: without deformities, mild arthritic changes; no varicose veins/ venous insuffic/ or edema. NEURO:  no focal neuro deficits... DERM:  fine macular/papular rash along upper back, neck and under later axilla, no sign redness.   RADIOLOGY DATA:  Reviewed in the EPIC EMR & discussed w/ the patient...  LABORATORY DATA:  Reviewed  in the [facility] EMR & discussed w/ the patient...   Assessment:      Hx CWP in the past, ?MVP, Hx SVT>  She denies recurrent CP & notes that palpit are diminished off aleve rx...  CHOL>  FLP w/ LDL ~120 & she is content w/ this level of control on diet alone; she declines meds rx...  HYPOTHYROID>  TSH is stable on Synthroid 47mcg/d (doesn't want generic); she notes that she wants to keep the TSH low because she feels better at these levels...  GI> HH, Divertics, Polyps>  She notes that insurance won't cover colonoscopy x every 41yrs...  DJD>  She had right knee arthroscopy by DrGioffre...  Anxiety> stress caring for Herg; state 3 friends w/ spots on their  lungs (reassured w/ clear CXR today)...     Plan:     Patient's Medications  New Prescriptions   No medications on file  Previous Medications   ASPIRIN 81 MG TABLET    Take 81 mg by mouth daily.     CHOLECALCIFEROL (VITAMIN D) 1000 UNITS CAPSULE    Take 1,000 Units by mouth daily.     DOCUSATE SODIUM (COLACE) 100 MG CAPSULE    Take 100 mg by mouth daily. Takes 3 daily    ESTRADIOL (VIVELLE-DOT) 0.05 MG/24HR    Place 1 patch onto the skin 2 (two) times a week.     FISH OIL-OMEGA-3 FATTY ACIDS 1000 MG CAPSULE    Take 2 g by mouth daily.     MULTIPLE VITAMINS-MINERALS (MULTIVITAMIN WITH MINERALS) TABLET    Take 1 tablet by mouth daily.     PSYLLIUM (METAMUCIL) 58.6 % POWDER    Take 1 packet by mouth daily.     RED YEAST RICE 600 MG CAPS    Take 1 capsule by mouth daily.   VITAMIN B-12 (CYANOCOBALAMIN) 1000 MCG TABLET    Take 1,000 mcg by mouth daily. Takes liquid form    VITAMIN C (ASCORBIC ACID) 500 MG TABLET    Take 500 mg by mouth daily.  Modified Medications   Modified Medication Previous Medication   LEVOTHYROXINE (SYNTHROID, LEVOTHROID) 88 MCG TABLET levothyroxine (SYNTHROID, LEVOTHROID) 88 MCG tablet      Take 1 tablet (88 mcg total) by mouth daily.    Take 1 tablet (88 mcg total) by mouth daily.  Discontinued Medications   No medications on file

## 2012-10-22 ENCOUNTER — Ambulatory Visit (INDEPENDENT_AMBULATORY_CARE_PROVIDER_SITE_OTHER): Payer: Medicare Other | Admitting: Internal Medicine

## 2012-10-22 ENCOUNTER — Encounter: Payer: Self-pay | Admitting: Internal Medicine

## 2012-10-22 VITALS — BP 149/73 | HR 75 | Ht 63.0 in | Wt 146.8 lb

## 2012-10-22 DIAGNOSIS — R002 Palpitations: Secondary | ICD-10-CM | POA: Insufficient documentation

## 2012-10-22 NOTE — Assessment & Plan Note (Signed)
She has a history of irregular tachypalpitations associated also with a "jumpy" heart.  I wonder whether these represent atrial fibrillation not withstanding the impression earlier that they were PVCs. There was an event recorder apparently ordered to support the latter diagnosis; however, I don't ever finding in the record the report itself or a description of the event recorder itself.  These episodes are quite distinct from her previous SVT; it is unlikely that she has recurrent AV nodal reentry.  We'll undertake a 30 day patient active and recorder to try to elucidate the mechanism.

## 2012-10-22 NOTE — Patient Instructions (Addendum)
Your physician has recommended that you wear a 30 day patient activated event monitor. Event monitors are medical devices that record the heart's electrical activity. Doctors most often [place] these monitors to diagnose arrhythmias. Arrhythmias are problems with the speed or rhythm of the heartbeat. The monitor is a small, portable device. You can wear one while you do your normal daily activities. This is usually used to diagnose what is causing palpitations/syncope (passing out).  Your physician recommends that you schedule a follow-up appointment in: approx 5 weeks after the event monitor to discuss the results

## 2012-10-22 NOTE — Progress Notes (Signed)
ELECTROPHYSIOLOGY CONSULT NOTE  Patient ID: Marissa Hart, MRN: [id number], DOB/AGE: July 04, 1935 78 y.o. Admit date: (Not on file) Date of Consult: 10/22/2012  Primary Physician: Michele Mcalpine, MD Primary Cardiologist:    Chief Complaint: palpitations   HPI CARSYN Hart is a 78 y.o. female  With a history of a prior catheter ablation for AV node reentry.This was 2001. About 8 years later she began with recurrent palpitations. I cannot quite glean from the notes what was identifiedthe patient remembers being told that they were "PVCs". She describes 2 different palpitation syndromes. The first is a jumping. It can be followed by irregular racing. This can last for many many minutes. It is not last hours as than her previous tachycardia. It is associated with significant lightheadedness primarily of onset although I can persist for some time. There is a vague accompanying chest discomfort that intermittently evident. These episodes can occur with exertion, i.e. When she is playing tennis, while sitting or driving, and even awaken her from sleep.  When she saw Dr. Leonia Reeves for this in 2008-10 era metoprolol was initiated but had to be gradually down titrated because of low blood pressure.  She has no other significant cardiac history and no symptoms of exercise intolerance apart from the associations with the aforementionedhe    Past Medical History  Diagnosis Date  . Arthritis   . Blood transfusion   . GERD (gastroesophageal reflux disease)   . Hyperlipidemia   . Thyroid disease   . Cataract   . Mitral valve prolapse   . Supraventricular tachycardia   . Hiatal hernia   . Diverticulosis of colon   . Hx of colonic polyps   . Acquired renal cyst of right kidney   . Anxiety   . Erysipelas   . Fibromyalgia       Surgical History:  Past Surgical History  Procedure Date  . Abdominal hysterectomy   . Oophorectomy   . Breast biopsie     benign lt. breast  . Colonoscopy   .  Colonoscopy   . Upper gastrointestinal endoscopy   . Esophagogastroduodenoscopy   . Cataract extraction bilateral w/ anterior vitrectomy   . Rotator cuff repair 08/2001    Dr. Darrelyn Hillock  . Breast biopsiy     2011-benign     Home Meds: Prior to Admission medications   Medication Sig Start Date End Date Taking? Authorizing Provider  aspirin 81 MG tablet Take 81 mg by mouth daily.     Yes Historical Provider, MD  Cholecalciferol (VITAMIN D) 1000 UNITS capsule Take 1,000 Units by mouth daily.     Yes Historical Provider, MD  docusate sodium (COLACE) 100 MG capsule Take 100 mg by mouth daily. Takes 3 daily    Yes Historical Provider, MD  estradiol (VIVELLE-DOT) 0.05 MG/24HR Place 1 patch onto the skin 2 (two) times a week.     Yes Historical Provider, MD  fish oil-omega-3 fatty acids 1000 MG capsule Take 2 g by mouth daily.     Yes Historical Provider, MD  levothyroxine (SYNTHROID, LEVOTHROID) 88 MCG tablet Take 1 tablet (88 mcg total) by mouth daily. 10/18/12  Yes Michele Mcalpine, MD  Multiple Vitamins-Minerals (MULTIVITAMIN WITH MINERALS) tablet Take 1 tablet by mouth daily.     Yes Historical Provider, MD  psyllium (METAMUCIL) 58.6 % powder Take 1 packet by mouth daily.     Yes Historical Provider, MD  Red Yeast Rice 600 MG CAPS Take 1 capsule by mouth daily.  Yes Historical Provider, MD  vitamin B-12 (CYANOCOBALAMIN) 1000 MCG tablet Take 1,000 mcg by mouth daily. Takes liquid form    Yes Historical Provider, MD  vitamin C (ASCORBIC ACID) 500 MG tablet Take 500 mg by mouth daily.   Yes Historical Provider, MD    : Allergies:  Allergies  Allergen Reactions  . Codeine     REACTION: nausea/dizziness  . Codeine Phosphate     REACTION: nausea,dizzy  . Tramadol Hcl     REACTION: nausea--dizzy    History   Social History  . Marital Status: Married    Spouse Name: N/A    Number of Children: 1  . Years of Education: N/A   Occupational History  .     Social History Main Topics  .  Smoking status: Former Smoker -- 3 years    Types: Cigarettes    Quit date: 09/19/1966  . Smokeless tobacco: Not on file  . Alcohol Use: 1.8 oz/week    3 Glasses of wine per week     Comment: wine weekly  . Drug Use: No  . Sexually Active: Not on file   Other Topics Concern  . Not on file   Social History Narrative  . No narrative on file     No family history on file.   ROS:  Please see the history of present illness.   Negative except fatigue   All other systems reviewed and negative.    Physical Exam:  Blood pressure 149/73, pulse 75, height 5\' 3"  (1.6 m), weight 146 lb 12.8 oz (66.588 kg). General: Well developed, well nourished female in no acute distress. Head: Normocephalic, atraumatic, sclera non-icteric, no xanthomas, nares are without discharge. Lymph Nodes:  none Back: without scoliosis/kyphosis , no CVA tendersness Neck: Negative for carotid bruits. JVD not elevated. Lungs: Clear bilaterally to auscultation without wheezes, rales, or rhonchi. Breathing is unlabored. Heart: RRR with S1 S2. No  murmur , rubs, or gallops appreciated. Abdomen: Soft, non-tender, non-distended with normoactive bowel sounds. No hepatomegaly. No rebound/guarding. No obvious abdominal masses. Msk:  Strength and tone appear normal for age. Extremities: No clubbing or cyanosis. No edema.  Distal pedal pulses are 2+ and equal bilaterally. Skin: Warm and Dry Neuro: Alert and oriented X 3. CN III-XII intact Grossly normal sensory and motor function . Psych:  Responds to questions appropriately with a normal affect.      Labs: Cardiac Enzymes No results found for this basename: CKTOTAL:4,CKMB:4,TROPONINI:4 in the last 72 hours CBC Lab Results  Component Value Date   WBC 7.5 10/11/2012   HGB 14.1 10/11/2012   HCT 41.0 10/11/2012   MCV 96.2 10/11/2012   PLT 199.0 10/11/2012   Lipids Lab Results  Component Value Date   CHOL 193 10/11/2012   HDL 38.40* 10/11/2012   LDLCALC 123* 10/11/2012    TRIG 156.0* 10/11/2012    Radiology/Studies:  No results found.  EKG: sinus rhythm at 75 Interval 17/08/41 Axis is 26 Low-voltage QRS and nonspecific ST-T changes   Assessment and Plan:   Sherryl Manges

## 2012-10-24 ENCOUNTER — Encounter (INDEPENDENT_AMBULATORY_CARE_PROVIDER_SITE_OTHER): Payer: Medicare Other

## 2012-10-24 ENCOUNTER — Telehealth: Payer: Self-pay | Admitting: *Deleted

## 2012-10-24 DIAGNOSIS — R002 Palpitations: Secondary | ICD-10-CM

## 2012-10-24 NOTE — Telephone Encounter (Signed)
30 day event monitor placed on Pt 10/24/12 TK

## 2012-11-01 ENCOUNTER — Institutional Professional Consult (permissible substitution): Payer: Medicare Other | Admitting: Internal Medicine

## 2012-11-08 ENCOUNTER — Institutional Professional Consult (permissible substitution): Payer: Medicare Other | Admitting: Internal Medicine

## 2012-11-09 ENCOUNTER — Institutional Professional Consult (permissible substitution): Payer: Medicare Other | Admitting: Internal Medicine

## 2012-11-26 ENCOUNTER — Telehealth: Payer: Self-pay | Admitting: Internal Medicine

## 2012-11-27 ENCOUNTER — Encounter: Payer: Self-pay | Admitting: Internal Medicine

## 2012-11-30 ENCOUNTER — Encounter: Payer: Self-pay | Admitting: Pulmonary Disease

## 2012-11-30 ENCOUNTER — Other Ambulatory Visit: Payer: Self-pay | Admitting: Pulmonary Disease

## 2012-11-30 ENCOUNTER — Ambulatory Visit (INDEPENDENT_AMBULATORY_CARE_PROVIDER_SITE_OTHER): Payer: Medicare Other | Admitting: Internal Medicine

## 2012-11-30 ENCOUNTER — Encounter: Payer: Self-pay | Admitting: Internal Medicine

## 2012-11-30 VITALS — BP 110/71 | HR 66 | Ht 63.0 in | Wt 148.0 lb

## 2012-11-30 DIAGNOSIS — D126 Benign neoplasm of colon, unspecified: Secondary | ICD-10-CM

## 2012-11-30 DIAGNOSIS — I498 Other specified cardiac arrhythmias: Secondary | ICD-10-CM

## 2012-11-30 DIAGNOSIS — K5730 Diverticulosis of large intestine without perforation or abscess without bleeding: Secondary | ICD-10-CM

## 2012-11-30 DIAGNOSIS — I4891 Unspecified atrial fibrillation: Secondary | ICD-10-CM

## 2012-11-30 DIAGNOSIS — I471 Supraventricular tachycardia: Secondary | ICD-10-CM

## 2012-11-30 NOTE — Telephone Encounter (Signed)
Spoke with pt, she was seen by dr Graciela Husbands this am and she researched the meds he gave her and she called to report any of the antiarrhythmic drugs are okay. The three anticoag drugs are all high. She maybe able to use the cards from the company, she has no medication coverage with her insurance. Will discuss with dr Graciela Husbands and call the pt back.

## 2012-11-30 NOTE — Telephone Encounter (Signed)
New problem    Discuss medication & cost.

## 2012-11-30 NOTE — Assessment & Plan Note (Signed)
The patient has intermittent atrial fibrillation on her event recorder. It frequently occurs as a consequence of atrial tachycardia at least temporally. We discussed thromboembolic risk and she will check with her druggist about the cost of a NOAC.  We also discussed the role of AV nodal blockers which she is concerned about because of a history of hypotension as well as the role of antiarrhythmic drugs, she will check with her druggist also about relative cost. I would prefer to use propafenone if we can

## 2012-11-30 NOTE — Progress Notes (Signed)
Patient Care Team: Michele Mcalpine, MD as PCP - General (Pulmonary Disease)   HPI  Marissa Hart is a 78 y.o. female Seen in followup for palpitations. She undergone ablation for AV node reentry in 2001. She then had no symptoms for about 8 years. She was then told he had PVCs as well as having symptoms of "irregular racing". Event recorder demonstrated multiple abnormalities 1-isolated ectopics, 2-atrial tachycardia with variable conduction some with variableblock and some one-to-one up to heart rates of 160, 3-atrial fibrillation.    Thromboembolic risk factors are notable for age-69 and gender-1 for a CHADS-VASc score of 3.  She is previously not tolerated metoprolol because of hypotension.  She also significant stress related to the care of her 1 year old husband who is ill, her sister-in-law who is have dementia and her sister from she is a caregiver     Past Medical History  Diagnosis Date  . Arthritis   . Blood transfusion      and  . GERD (gastroesophageal reflux disease)   . Hyperlipidemia   . Thyroid disease   . Cataract   . Palpitations   . Supraventricular tachycardia     AV nodal reentry  s/p RFCA 2001  . Hiatal hernia   . Diverticulosis of colon   . Hx of colonic polyps   . Acquired renal cyst of right kidney   . Anxiety   . Erysipelas   . Fibromyalgia     Past Surgical History  Procedure Laterality Date  . Abdominal hysterectomy    . Oophorectomy    . Breast biopsie      benign lt. breast  . Colonoscopy    . Colonoscopy    . Upper gastrointestinal endoscopy    . Esophagogastroduodenoscopy    . Cataract extraction bilateral w/ anterior vitrectomy    . Rotator cuff repair  08/2001    Dr. Darrelyn Hillock  . Breast biopsiy      2011-benign    Current Outpatient Prescriptions  Medication Sig Dispense Refill  . aspirin 81 MG tablet Take 81 mg by mouth daily.        . Cholecalciferol (VITAMIN D) 1000 UNITS capsule Take 1,000 Units by mouth daily.        [name] Kitchen  docusate sodium (COLACE) 100 MG capsule Take 100 mg by mouth daily. Takes 3 daily       . estradiol (VIVELLE-DOT) 0.05 MG/24HR Place 1 patch onto the skin 2 (two) times a week.        . fish oil-omega-3 fatty acids 1000 MG capsule Take 2 g by mouth daily.        [name] Kitchen levothyroxine (SYNTHROID, LEVOTHROID) 88 MCG tablet Take 1 tablet (88 mcg total) by mouth daily.  90 tablet  3  . Multiple Vitamins-Minerals (MULTIVITAMIN WITH MINERALS) tablet Take 1 tablet by mouth daily.        . psyllium (METAMUCIL) 58.6 % powder Take 1 packet by mouth daily.        . Red Yeast Rice 600 MG CAPS Take 1 capsule by mouth daily.      . vitamin B-12 (CYANOCOBALAMIN) 1000 MCG tablet Take 1,000 mcg by mouth daily. Takes liquid form       . vitamin C (ASCORBIC ACID) 500 MG tablet Take 500 mg by mouth daily.       No current facility-administered medications for this visit.    Allergies  Allergen Reactions  . Codeine     REACTION: nausea/dizziness  . Codeine  Phosphate     REACTION: nausea,dizzy  . Tramadol Hcl     REACTION: nausea--dizzy    Review of Systems negative except from HPI and PMH  Physical Exam BP 110/71  Pulse 66  Ht 5\' 3"  (1.6 m)  Wt 148 lb (67.132 kg)  BMI 26.22 kg/m2 Well developed and well nourished in no acute distress HENT normal E scleral and icterus clear Neck Supple JVP flat; carotids brisk and full Clear to ausculation Regular rate and rhythm, no murmurs gallops or rub Soft with active bowel sounds No clubbing cyanosis none Edema Alert and oriented, grossly normal motor and sensory function Skin Warm and Dry  Event recorder strips are as noted above.  Assessment and  Plan

## 2012-11-30 NOTE — Patient Instructions (Addendum)
Your physician recommends that you schedule a follow-up appointment in: 3 MONTHS WITH DR KLEIN  

## 2012-11-30 NOTE — Assessment & Plan Note (Signed)
As above.

## 2012-12-03 ENCOUNTER — Encounter: Payer: Self-pay | Admitting: Gastroenterology

## 2012-12-03 ENCOUNTER — Telehealth: Payer: Self-pay | Admitting: Pulmonary Disease

## 2012-12-03 ENCOUNTER — Encounter: Payer: Self-pay | Admitting: Internal Medicine

## 2012-12-03 MED ORDER — PROPAFENONE HCL 225 MG PO TABS: 225.0000 mg | ORAL_TABLET | Freq: Two times a day (BID) | ORAL | Status: DC

## 2012-12-03 NOTE — Telephone Encounter (Signed)
Discussed with dr Graciela Husbands, pt to start propafenone 225 mg bid. Cards given for each of the new anticoag and she is going to find out if she can use them. If not then she will need to be started on warfarin sodium. Pt voiced understanding. She will pick up the cards at the front desk tomorrow.

## 2012-12-03 NOTE — Telephone Encounter (Signed)
Called and spoke with pt and she stated that she was told that they could not schedule  Her appt with GI until she started on the blood thinner.  She stated that she is waiting to hear back from the cardiologist to start on the blood thinner.  Pt is aware to call me back once this has been started and we can schedule the appt with GI. Nothing further is needed.

## 2012-12-03 NOTE — Telephone Encounter (Signed)
Message copied from MyChart:    Appointment Request From: Tennis Ship With Provider: Michele Mcalpine, MD [-Primary Care Physician-]  Preferred Date Range: Any date 11/30/2012 or later  Preferred Times: Monday Afternoon, Tuesday Afternoon, Wednesday Afternoon, Thursday Afternoon, Friday Afternoon  Reason: To address the following health maintenance concerns. Colonoscopy  Comments: [name],CMA Do I get an appointment with Dr. Kriste Basque or Dr. Jarold Motto to set up my colonoscopy? At the top it says "I want to see: Alroy Dust. I did not see Dr. Norval Gable name. I saw Dr. Kriste Basque Jan. 30. so I thought I would be getting an appt. with Dr. Jarold Motto.

## 2012-12-03 NOTE — Telephone Encounter (Signed)
Pt states she is returning LIbby's call.  Pt's number is [phone].  Antionette Fairy

## 2012-12-04 ENCOUNTER — Telehealth: Payer: Self-pay | Admitting: *Deleted

## 2012-12-04 ENCOUNTER — Encounter: Payer: Self-pay | Admitting: Internal Medicine

## 2012-12-04 NOTE — Telephone Encounter (Signed)
Patient email sent 3/18 as well. Will attach to previously open phone note as I did not realize this was already open.

## 2012-12-04 NOTE — Telephone Encounter (Signed)
 -----   Message -----   From: Tennis Ship   Sent: 12/03/2012 2:16 PM   To: [name] Triage   Subject: Non-Urgent Medical Question       Attention: Herbert Seta   I saw Dr.Klein on Friday and he ask me to check on some medications and to call you with the information.   I called and you were not in. Would you please give me a call at [phone] when you come in on Tuesday.   Thank you,   Tennis Ship B/D [birth year]

## 2012-12-04 NOTE — Telephone Encounter (Signed)
-----   Message ----- From: Tennis Ship Sent: 12/04/2012 7:22 AM To: [name] Triage Subject: Non-Urgent Medical Question Attn:[name] I am scheduled for a colonscopy in April. Would you ask Dr. Graciela Husbands if I could wait until after the procedure to start the blood thinner medication? If I could wait on that medication, would it be OK to start the Propafenone for the AFib? I understand that I have to be off of blood thinner for a period of time, before having the colonscopy. Thanks, Marissa Hart B/D 10-23-2034  Will forward to Dr. Graciela Husbands for review.

## 2012-12-05 ENCOUNTER — Encounter: Payer: Self-pay | Admitting: Internal Medicine

## 2012-12-05 MED ORDER — APIXABAN 5 MG PO TABS: 5.0000 mg | ORAL_TABLET | Freq: Two times a day (BID) | ORAL | Status: DC

## 2012-12-05 NOTE — Telephone Encounter (Signed)
I spoke with Dr. Graciela Husbands. Pt to start Eliquis 5mg  twice a day. Per Dr. Graciela Husbands, pt can stop the Eliquis 48hours prior to her colonoscopy.  Talked with pt.  Dr. Graciela Husbands would like pt to start the Eliquis now and she may stop it 48 hours prior to the colonoscopy.  Will also forward this note to Dr. Nyra Capes at pt request. Mylo Red RN

## 2012-12-06 ENCOUNTER — Encounter: Payer: Self-pay | Admitting: Gastroenterology

## 2012-12-06 NOTE — Telephone Encounter (Signed)
Will need cardiology guidance before colonoscopy

## 2012-12-06 NOTE — Telephone Encounter (Signed)
Dr Jarold Motto, pt has seen Dr Graciela Husbands and was started on Eliquis

## 2012-12-07 ENCOUNTER — Encounter: Payer: Self-pay | Admitting: Gastroenterology

## 2012-12-07 ENCOUNTER — Ambulatory Visit (AMBULATORY_SURGERY_CENTER): Payer: Medicare Other | Admitting: *Deleted

## 2012-12-07 ENCOUNTER — Telehealth: Payer: Self-pay | Admitting: Internal Medicine

## 2012-12-07 VITALS — Ht 63.0 in | Wt 140.0 lb

## 2012-12-07 DIAGNOSIS — Z1211 Encounter for screening for malignant neoplasm of colon: Secondary | ICD-10-CM

## 2012-12-07 MED ORDER — MOVIPREP 100 G PO SOLR: ORAL | Status: DC

## 2012-12-07 NOTE — Telephone Encounter (Signed)
New problem    Marissa Hart at Dr. Jarold Motto office Trying to get permission to have colonoscopy on Monday  3/24. Patient need to start on eliquis.

## 2012-12-07 NOTE — Telephone Encounter (Signed)
I spoke with pt and she is having colonoscopy on Monday with Dr. Jarold Motto. She has not started the Eliquis yet and would have to stop it tomorrow per Dr. Graciela Husbands on 12/05/12 ( stop 48 hours prior). She will start Eliquis now after her colonoscopy.  Spoke also with Aram Beecham at [facility] and she received the message sent on 12/05/12  Mylo Red RN

## 2012-12-07 NOTE — Telephone Encounter (Signed)
This problem has been resolved. Pt will come for her PV today and have her COLON on 12/10/12 , then she will start Eliquis.

## 2012-12-10 ENCOUNTER — Ambulatory Visit (AMBULATORY_SURGERY_CENTER): Payer: Medicare Other | Admitting: Gastroenterology

## 2012-12-10 ENCOUNTER — Encounter: Payer: Self-pay | Admitting: Gastroenterology

## 2012-12-10 VITALS — BP 134/78 | HR 52 | Temp 98.0°F | Resp 24 | Ht 63.0 in | Wt 140.0 lb

## 2012-12-10 DIAGNOSIS — K5730 Diverticulosis of large intestine without perforation or abscess without bleeding: Secondary | ICD-10-CM

## 2012-12-10 DIAGNOSIS — Z1211 Encounter for screening for malignant neoplasm of colon: Secondary | ICD-10-CM

## 2012-12-10 DIAGNOSIS — D126 Benign neoplasm of colon, unspecified: Secondary | ICD-10-CM

## 2012-12-10 MED ORDER — SODIUM CHLORIDE 0.9 % IV SOLN: 500.0000 mL | INTRAVENOUS | Status: DC

## 2012-12-10 NOTE — Op Note (Signed)
[facility] [address] [place], [address]   COLONOSCOPY PROCEDURE REPORT  PATIENT: Marissa Hart, Marissa Hart  MR#: [id number] BIRTHDATE: [birth year] , 78  yrs. old GENDER: Female ENDOSCOPIST: Mardella Layman, MD, [facility] REFERRED BY: PROCEDURE DATE:  12/10/2012 PROCEDURE:   Colonoscopy with biopsy ASA CLASS:   Class II INDICATIONS:Average risk patient for colon cancer. MEDICATIONS: Propofol (Diprivan) 200 mg IV  DESCRIPTION OF PROCEDURE:   After the risks and benefits and of the procedure were explained, informed consent was obtained.  A digital rectal exam revealed no abnormalities of the rectum.    The LB [id number]  endoscope was introduced through the anus and advanced to the cecum, which was identified by both the appendix and ileocecal valve .  The quality of the prep was excellent, using MoviPrep .  The instrument was then slowly withdrawn as the colon was fully examined.     COLON FINDINGS: Moderate diverticulosis was noted in the descending colon and sigmoid colon.   A small smooth sessile polyp was found in the rectum.  A polypectomy was performed with cold forceps.  The resection was complete and the polyp tissue was completely retrieved.     Retroflexed views revealed no abnormalities.     The scope was then withdrawn from the patient and the procedure completed.  COMPLICATIONS: There were no complications. ENDOSCOPIC IMPRESSION: 1.   Moderate diverticulosis was noted in the descending colon and sigmoid colon 2.   Small sessile polyp was found in the rectum; polypectomy was performed with cold forceps  RECOMMENDATIONS: 1.  Await pathology results 2.  High fiber diet with liberal fluid intake. 3.  Given your age, you will not need another colonoscopy for colon cancer screening or polyp surveillance.  These types of tests usually stop around the age 72.   REPEAT EXAM:  NF:AOZHY Elayne Snare, MD  _______________________________ eSigned:   Mardella Layman, MD, [facility] 12/10/2012 2:28 PM     PATIENT NAME:  Marissa Hart, Marissa Hart MR#: [id number]

## 2012-12-10 NOTE — Patient Instructions (Addendum)

## 2012-12-10 NOTE — Progress Notes (Signed)
Called to room to assist during endoscopic procedure.  Patient ID and intended procedure confirmed with present staff. Received instructions for my participation in the procedure from the performing physician.  

## 2012-12-10 NOTE — Progress Notes (Addendum)
Patient did not have preoperative order for IV antibiotic SSI prophylaxis. (G8918)  Patient did not experience any of the following events: a burn prior to discharge; a fall within the facility; wrong site/side/patient/procedure/implant event; or a hospital transfer or hospital admission upon discharge from the facility. (G8907)  

## 2012-12-11 ENCOUNTER — Telehealth: Payer: Self-pay | Admitting: *Deleted

## 2012-12-11 NOTE — Telephone Encounter (Signed)
  Follow up Call-  Call back number 12/10/2012  Post procedure Call Back phone  # [phone]  Permission to leave phone message Yes     Patient questions:  Do you have a fever, pain , or abdominal swelling? no Pain Score  0 *  Have you tolerated food without any problems? yes  Have you been able to return to your normal activities? yes  Do you have any questions about your discharge instructions: Diet   no Medications  no Follow up visit  no  Do you have questions or concerns about your Care? no  Actions: * If pain score is 4 or above: No action needed, pain <4.

## 2012-12-12 ENCOUNTER — Encounter: Payer: Self-pay | Admitting: Internal Medicine

## 2012-12-12 ENCOUNTER — Telehealth: Payer: Self-pay | Admitting: Internal Medicine

## 2012-12-12 NOTE — Telephone Encounter (Signed)
Pt sates she bump her hand on a chair without realizing, and all the sudden a big hematoma appears on top of her hand. Pt put ice and pressure on it ,so the hematoma is almost gone. PT STATES beside Eliquis 5 mg twice a day she is taken a 81 mg Aspirin once a day and Omega-3 Fish oil. Pt wants to make sure she is  to take the aspirin with the Eliquis. Pt is asking just in case, this  was overlooked.

## 2012-12-12 NOTE — Telephone Encounter (Signed)
New problem   Pt is taking blood thinner and has a big hematoma on top of right hand. Pt want to know if she need to stay on Asprin 81mg  and Fish Oil. Please call pt concerning this matter

## 2012-12-13 NOTE — Telephone Encounter (Signed)
I spoke with the patient. She is aware that she can stop her aspirin.

## 2012-12-13 NOTE — Telephone Encounter (Signed)
Yes  apixoban allows her to stop aspirin

## 2012-12-17 ENCOUNTER — Encounter: Payer: Self-pay | Admitting: Gastroenterology

## 2012-12-21 ENCOUNTER — Ambulatory Visit: Payer: Medicare Other | Admitting: Gastroenterology

## 2013-01-05 ENCOUNTER — Encounter: Payer: Self-pay | Admitting: Internal Medicine

## 2013-01-07 ENCOUNTER — Encounter: Payer: Medicare Other | Admitting: Gastroenterology

## 2013-01-07 ENCOUNTER — Telehealth: Payer: Self-pay | Admitting: *Deleted

## 2013-01-07 NOTE — Telephone Encounter (Signed)
Per "patient advise request"- will forward to MD for review  ----- Message ----- From: Tennis Ship Sent: 01/05/2013 12:39 PM To: [name] Triage Subject: Non-Urgent Medical Question Dr. Graciela Husbands, I took Propafenone 225MG , as directed, for at least 2 weeks and I had so much trouble with light headiness and unsteady feeling (I could not drive) so I stopped it, after talking with the pharmacist. You were out of town. The A-fib is much better right now, so I would like to just stay off it, until I see you in June. If it gets worse, I will let you know. I'm still on the Eliquis.

## 2013-02-05 ENCOUNTER — Encounter: Payer: Self-pay | Admitting: Pulmonary Disease

## 2013-02-05 NOTE — Telephone Encounter (Signed)
Sounds like pt may have a stye.  Discussed with [name] There are otc therapies that can help Pt should contact her eye doctor for recs  Email sent to pt notifying her of the above Will sign off

## 2013-03-05 ENCOUNTER — Ambulatory Visit: Payer: Medicare Other | Admitting: Internal Medicine

## 2013-03-19 ENCOUNTER — Ambulatory Visit (INDEPENDENT_AMBULATORY_CARE_PROVIDER_SITE_OTHER): Payer: Medicare Other | Admitting: Internal Medicine

## 2013-03-19 ENCOUNTER — Encounter: Payer: Self-pay | Admitting: Internal Medicine

## 2013-03-19 VITALS — BP 148/86 | HR 69 | Ht 63.0 in | Wt 144.8 lb

## 2013-03-19 DIAGNOSIS — I4891 Unspecified atrial fibrillation: Secondary | ICD-10-CM

## 2013-03-19 MED ORDER — VERAPAMIL HCL ER 120 MG PO TBCR: EXTENDED_RELEASE_TABLET | ORAL | Status: DC

## 2013-03-19 NOTE — Patient Instructions (Signed)
Your physician has recommended you make the following change in your medication:  1) Take verapamil 120 mg one tablet daily as needed 2) Stop aspirin  Your physician has requested that you have an echocardiogram. Echocardiography is a painless test that uses sound waves to create images of your heart. It provides your doctor with information about the size and shape of your heart and how well your heart's chambers and valves are working. This procedure takes approximately one hour. There are no restrictions for this procedure.  You have been referred to : Dr. Johney Frame for consideration of a-fib ablation.

## 2013-03-19 NOTE — Progress Notes (Signed)
Patient Care Team: Michele Mcalpine, MD as PCP - General (Pulmonary Disease)   HPI  Marissa Hart is a 78 y.o. female Seen in followup for palpitations. She undergone ablation for AV node reentry in 2001. She then had no symptoms for about 8 years. She was then told she had PVCs as well as having symptoms of "irregular racing".   Event recorder demonstrated multiple abnormalities 1-isolated ectopics, 2-atrial tachycardia with variable conduction some with variableblock and some one-to-one up to heart rates of 160, 3-atrial fibrillation.   Thromboembolic risk factors are notable for age-58 and gender-1 for a CHADS-VASc score of 3.   At our last visit in March, we discussed the role of anticoagulation is a NOAC as well as AV nodal blocking agents which she had poorly tolerated previously because of hypotension.  she elected to start apixaban; we will stop her aspirin  She tried to take propafenone. She was unable to tolerate it.   Past Medical History  Diagnosis Date  . Arthritis   . Blood transfusion      and  . GERD (gastroesophageal reflux disease)   . Hyperlipidemia   . Thyroid disease   . Cataract   . Palpitations   . Supraventricular tachycardia     AV nodal reentry  s/p RFCA 2001  . Hiatal hernia   . Diverticulosis of colon   . Hx of colonic polyps   . Acquired renal cyst of right kidney   . Anxiety   . Erysipelas   . Fibromyalgia     Past Surgical History  Procedure Laterality Date  . Abdominal hysterectomy    . Oophorectomy    . Breast biopsie      benign lt. breast  . Colonoscopy    . Colonoscopy    . Upper gastrointestinal endoscopy    . Esophagogastroduodenoscopy    . Cataract extraction bilateral w/ anterior vitrectomy    . Rotator cuff repair  08/2001    Dr. Darrelyn Hillock  . Breast biopsiy      2011-benign  . Knee arthroscopy  2012    Current Outpatient Prescriptions  Medication Sig Dispense Refill  . apixaban (ELIQUIS) 5 MG TABS tablet Take 1 tablet (5  mg total) by mouth 2 (two) times daily.  30 tablet  11  . aspirin 81 MG tablet Take 81 mg by mouth daily.        . Cholecalciferol (VITAMIN D) 1000 UNITS capsule Take 1,000 Units by mouth daily.        [name] Kitchen docusate sodium (COLACE) 100 MG capsule Take 100 mg by mouth daily. Takes 3 daily       . estradiol (VIVELLE-DOT) 0.05 MG/24HR Place 1 patch onto the skin 2 (two) times a week.        . fish oil-omega-3 fatty acids 1000 MG capsule Take 2 g by mouth daily.        [name] Kitchen levothyroxine (SYNTHROID, LEVOTHROID) 88 MCG tablet Take 1 tablet (88 mcg total) by mouth daily.  90 tablet  3  . Multiple Vitamins-Minerals (MULTIVITAMIN WITH MINERALS) tablet Take 1 tablet by mouth daily.        . psyllium (METAMUCIL) 58.6 % powder Take 1 packet by mouth daily.        . Red Yeast Rice 600 MG CAPS Take 1 capsule by mouth daily.      . vitamin B-12 (CYANOCOBALAMIN) 1000 MCG tablet Take 1,000 mcg by mouth daily. Takes liquid form       .  vitamin C (ASCORBIC ACID) 500 MG tablet Take 500 mg by mouth daily.       No current facility-administered medications for this visit.    Allergies  Allergen Reactions  . Codeine     REACTION: nausea/dizziness  . Codeine Phosphate     REACTION: nausea,dizzy  . Tramadol Hcl     REACTION: nausea--dizzy    Review of Systems negative except from HPI and PMH  Physical Exam BP 148/86  Pulse 69  Ht 5\' 3"  (1.6 m)  Wt 144 lb 12.8 oz (65.681 kg)  BMI 25.66 kg/m2 Well developed and nourished in no acute distress HENT normal Neck supple with JVP-flat Clear Regular rate and rhythm, no murmurs or gallops Abd-soft with active BS No Clubbing cyanosis edema Skin-warm and dry A & Oriented  Grossly normal sensory and motor function  ECG demonstrates sinus rhythm at 59 Interval 17/07/40 Axis LXII RSR prime Low-voltage  Assessment and  Plan

## 2013-03-19 NOTE — Assessment & Plan Note (Signed)
Event recorder confirms recurrent atrial fibrillation as well as possibly rapidly conducting atrial tachycardia. She has been poorly tolerant of medications. she continues to take the apixaban. We will give her verapamil to use on an as-needed basis. We discussed the potential role of catheter ablation as an alternative to drug therapy which he has tolerated poorly. She would like to consider this.  We will get an echocardiogram to look at left atrial dimensions it may consider referral to Dr. Johney Frame for his input

## 2013-03-26 ENCOUNTER — Ambulatory Visit (HOSPITAL_COMMUNITY): Payer: Medicare Other | Attending: Internal Medicine

## 2013-03-26 DIAGNOSIS — I059 Rheumatic mitral valve disease, unspecified: Secondary | ICD-10-CM | POA: Insufficient documentation

## 2013-03-26 DIAGNOSIS — I359 Nonrheumatic aortic valve disorder, unspecified: Secondary | ICD-10-CM | POA: Insufficient documentation

## 2013-03-26 DIAGNOSIS — I079 Rheumatic tricuspid valve disease, unspecified: Secondary | ICD-10-CM | POA: Insufficient documentation

## 2013-03-26 DIAGNOSIS — E785 Hyperlipidemia, unspecified: Secondary | ICD-10-CM | POA: Insufficient documentation

## 2013-03-26 DIAGNOSIS — I4891 Unspecified atrial fibrillation: Secondary | ICD-10-CM | POA: Insufficient documentation

## 2013-03-26 NOTE — Progress Notes (Signed)
Echocardiogram performed.  

## 2013-04-22 ENCOUNTER — Encounter: Payer: Self-pay | Admitting: *Deleted

## 2013-04-22 ENCOUNTER — Encounter: Payer: Self-pay | Admitting: Internal Medicine

## 2013-04-22 ENCOUNTER — Ambulatory Visit (INDEPENDENT_AMBULATORY_CARE_PROVIDER_SITE_OTHER): Payer: Medicare Other | Admitting: Internal Medicine

## 2013-04-22 ENCOUNTER — Ambulatory Visit: Payer: Medicare Other | Admitting: Internal Medicine

## 2013-04-22 VITALS — BP 120/74 | HR 76 | Ht 63.0 in | Wt 144.2 lb

## 2013-04-22 DIAGNOSIS — I4891 Unspecified atrial fibrillation: Secondary | ICD-10-CM

## 2013-04-22 DIAGNOSIS — I498 Other specified cardiac arrhythmias: Secondary | ICD-10-CM

## 2013-04-22 DIAGNOSIS — I471 Supraventricular tachycardia: Secondary | ICD-10-CM

## 2013-04-22 LAB — CBC WITH DIFFERENTIAL/PLATELET
Basophils Absolute: 0 10*3/uL (ref 0.0–0.1)
Eosinophils Absolute: 0.1 10*3/uL (ref 0.0–0.7)
HCT: 39 % (ref 36.0–46.0)
Hemoglobin: 13.1 g/dL (ref 12.0–15.0)
Lymphocytes Relative: 26.6 % (ref 12.0–46.0)
Lymphs Abs: 1.7 10*3/uL (ref 0.7–4.0)
MCHC: 33.7 g/dL (ref 30.0–36.0)
Neutro Abs: 4 10*3/uL (ref 1.4–7.7)
RDW: 13 % (ref 11.5–14.6)

## 2013-04-22 LAB — BASIC METABOLIC PANEL
BUN: 11 mg/dL (ref 6–23)
CO2: 28 mEq/L (ref 19–32)
Calcium: 9.3 mg/dL (ref 8.4–10.5)
Glucose, Bld: 78 mg/dL (ref 70–99)
Sodium: 139 mEq/L (ref 135–145)

## 2013-04-22 NOTE — Patient Instructions (Signed)

## 2013-04-22 NOTE — Progress Notes (Signed)
 Primary Care Physician: NADEL,SCOTT M, MD Referring Physician: Steven Klein, MD   Marissa Hart is a 78 y.o. female with a h/o hypothyroidism, fibromyalgia, hyperlipidemia and atrial arrhythmias.  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  For the last 5 years, she has been having increasing problems with palpitations and dizziness.  She was seen by Dr Klein and wore an event monitor which demonstrated PAC's, atrial tachycardia, atrial flutter and atrial fibrillation.  She was placed on Propafenone but was unable to tolerate this medication due to increased dizziness.  She did not feel like the Propafenone helped with her arrhythmia burden.  She was then given prn Verapamil but she has not taken this medication.   Echo in July of this year demonstrated an EF of 55-65% with mild focal basal hypertrophy of the septum and no RWMA. LA 34mm.   Today, she denies symptoms of  chest pain, shortness of breath, orthopnea, PND, lower extremity edema, presyncope, syncope, or neurologic sequela. The patient is tolerating medications without difficulties and is otherwise without complaint today.   Past Medical History  Diagnosis Date  . Arthritis   . Blood transfusion      and  . GERD (gastroesophageal reflux disease)   . Hyperlipidemia   . Thyroid disease   . Cataract   . Palpitations   . Supraventricular tachycardia     AV nodal reentry  s/p RFCA 2001  . Hiatal hernia   . Diverticulosis of colon   . Hx of colonic polyps   . Acquired renal cyst of right kidney   . Anxiety   . Erysipelas   . Fibromyalgia    Past Surgical History  Procedure Laterality Date  . Abdominal hysterectomy    . Oophorectomy    . Breast biopsie      benign lt. breast  . Colonoscopy    . Colonoscopy    . Upper gastrointestinal endoscopy    . Esophagogastroduodenoscopy    . Cataract extraction bilateral w/ anterior vitrectomy    . Rotator cuff repair  08/2001    Dr. Gioffre  .  Breast biopsiy      2011-benign  . Knee arthroscopy  2012    Current Outpatient Prescriptions  Medication Sig Dispense Refill  . apixaban (ELIQUIS) 5 MG TABS tablet Take 1 tablet (5 mg total) by mouth 2 (two) times daily.  30 tablet  11  . Cholecalciferol (VITAMIN D) 1000 UNITS capsule Take 1,000 Units by mouth daily.        . docusate sodium (COLACE) 100 MG capsule Take 100 mg by mouth daily. Takes 3 daily       . estradiol (VIVELLE-DOT) 0.05 MG/24HR Place 1 patch onto the skin 2 (two) times a week.        . fish oil-omega-3 fatty acids 1000 MG capsule Take 2 g by mouth daily.        . levothyroxine (SYNTHROID, LEVOTHROID) 88 MCG tablet Take 1 tablet (88 mcg total) by mouth daily.  90 tablet  3  . Multiple Vitamins-Minerals (MULTIVITAMIN WITH MINERALS) tablet Take 1 tablet by mouth daily.        . psyllium (METAMUCIL) 58.6 % powder Take 1 packet by mouth daily.        . Red Yeast Rice 600 MG CAPS Take 1 capsule by mouth daily.      . verapamil (CALAN-SR) 120 MG CR tablet Take one tablet by mouth daily as needed    30 tablet  3  . vitamin B-12 (CYANOCOBALAMIN) 1000 MCG tablet Take 1,000 mcg by mouth daily. Takes liquid form       . vitamin C (ASCORBIC ACID) 500 MG tablet Take 500 mg by mouth daily.       No current facility-administered medications for this visit.    Allergies  Allergen Reactions  . Codeine     REACTION: nausea/dizziness  . Codeine Phosphate     REACTION: nausea,dizzy  . Tramadol Hcl     REACTION: nausea--dizzy    History   Social History  . Marital Status: Married    Spouse Name: N/A    Number of Children: 1  . Years of Education: N/A   Occupational History  .     Social History Main Topics  . Smoking status: Former Smoker -- 3 years    Types: Cigarettes    Quit date: 09/19/1966  . Smokeless tobacco: Never Used  . Alcohol Use: 1.8 oz/week    3 Glasses of wine per week     Comment: wine weekly  . Drug Use: No  . Sexually Active: Not on file    Other Topics Concern  . Not on file   Social History Narrative  . No narrative on file    No family history on file.  ROS- All systems are reviewed and negative except as per the HPI above  Physical Exam: Filed Vitals:   04/22/13 0917  BP: 120/74  Pulse: 76  Height: 5' 3" (1.6 m)  Weight: 144 lb 3.2 oz (65.409 kg)    GEN- The patient is well appearing, alert and oriented x 3 today.   Head- normocephalic, atraumatic Eyes-  Sclera clear, conjunctiva pink Ears- hearing intact Oropharynx- clear Neck- supple, no JVP Lymph- no cervical lymphadenopathy Lungs- Clear to ausculation bilaterally, normal work of breathing Heart- Regular rate and rhythm, no murmurs, rubs or gallops, PMI not laterally displaced GI- soft, NT, ND, + BS Extremities- no clubbing, cyanosis, or edema MS- no significant deformity or atrophy Skin- no rash or lesion Psych- euthymic mood, full affect Neuro- strength and sensation are intact  EKG- sinus rhythm , RsR' Echo normal  Assessment and Plan:  1. Paroxysmal atrial fibrillation, atrial flutter, atrial tachycardia, PACs The patient has been intolerant to verapamil and rhythmol. Therapeutic strategies for atrial arrhythmias including medicine and ablation were discussed in detail with the patient today. Risk, benefits, and alternatives to EP study and radiofrequency ablation for afib were also discussed in detail today. I discussed multaq or tikosyn and potential options.  These risks include but are not limited to stroke, bleeding, vascular damage, tamponade, perforation, damage to the esophagus, lungs, and other structures, pulmonary vein stenosis, worsening renal function, and death. The patient understands these risk and wishes to proceed.  We will therefore proceed with catheter ablation at the next available time.  She will need a TEE within 24 hours of the procedure.  

## 2013-04-23 ENCOUNTER — Encounter (HOSPITAL_COMMUNITY): Payer: Self-pay | Admitting: Pharmacy Technician

## 2013-04-23 ENCOUNTER — Encounter: Payer: Self-pay | Admitting: Internal Medicine

## 2013-04-24 ENCOUNTER — Telehealth: Payer: Self-pay | Admitting: Internal Medicine

## 2013-04-24 NOTE — Telephone Encounter (Signed)
Follow up    Pt wants to know when does she stop her blood thinner before the procedure// Also inquire if someone will be at the front desk to inform her on where to go within [place]// will she be able to drive after the procedure.

## 2013-04-24 NOTE — Telephone Encounter (Signed)
Spoke withj patient and let her know to continue her medication and that she will not be able to drive for at least 5 days after the procedure

## 2013-04-24 NOTE — Telephone Encounter (Signed)
New Prob     Pt has some questions regarding her upcoming procedure. Please call.

## 2013-04-25 ENCOUNTER — Other Ambulatory Visit: Payer: Self-pay | Admitting: *Deleted

## 2013-04-29 ENCOUNTER — Ambulatory Visit (HOSPITAL_COMMUNITY)
Admission: RE | Admit: 2013-04-29 | Discharge: 2013-04-29 | Disposition: A | Discharge status: Home / Self Care | Payer: Medicare Other | Source: Ambulatory Visit | Attending: Interventional Cardiology | Admitting: Interventional Cardiology

## 2013-04-29 ENCOUNTER — Ambulatory Visit: Payer: Medicare Other | Admitting: Internal Medicine

## 2013-04-29 ENCOUNTER — Encounter (HOSPITAL_COMMUNITY)
Admission: RE | Disposition: A | Discharge status: Home / Self Care | Payer: Self-pay | Source: Ambulatory Visit | Attending: Interventional Cardiology

## 2013-04-29 ENCOUNTER — Encounter (HOSPITAL_COMMUNITY): Payer: Self-pay

## 2013-04-29 DIAGNOSIS — K219 Gastro-esophageal reflux disease without esophagitis: Secondary | ICD-10-CM | POA: Insufficient documentation

## 2013-04-29 DIAGNOSIS — IMO0001 Reserved for inherently not codable concepts without codable children: Secondary | ICD-10-CM | POA: Insufficient documentation

## 2013-04-29 DIAGNOSIS — Z7901 Long term (current) use of anticoagulants: Secondary | ICD-10-CM | POA: Insufficient documentation

## 2013-04-29 DIAGNOSIS — I079 Rheumatic tricuspid valve disease, unspecified: Secondary | ICD-10-CM | POA: Insufficient documentation

## 2013-04-29 DIAGNOSIS — N281 Cyst of kidney, acquired: Secondary | ICD-10-CM | POA: Insufficient documentation

## 2013-04-29 DIAGNOSIS — I700 Atherosclerosis of aorta: Secondary | ICD-10-CM | POA: Insufficient documentation

## 2013-04-29 DIAGNOSIS — I491 Atrial premature depolarization: Secondary | ICD-10-CM | POA: Insufficient documentation

## 2013-04-29 DIAGNOSIS — I4891 Unspecified atrial fibrillation: Secondary | ICD-10-CM

## 2013-04-29 DIAGNOSIS — Z79899 Other long term (current) drug therapy: Secondary | ICD-10-CM | POA: Insufficient documentation

## 2013-04-29 DIAGNOSIS — M129 Arthropathy, unspecified: Secondary | ICD-10-CM | POA: Insufficient documentation

## 2013-04-29 DIAGNOSIS — E785 Hyperlipidemia, unspecified: Secondary | ICD-10-CM | POA: Insufficient documentation

## 2013-04-29 DIAGNOSIS — Z87891 Personal history of nicotine dependence: Secondary | ICD-10-CM | POA: Insufficient documentation

## 2013-04-29 DIAGNOSIS — Z885 Allergy status to narcotic agent status: Secondary | ICD-10-CM | POA: Insufficient documentation

## 2013-04-29 DIAGNOSIS — E039 Hypothyroidism, unspecified: Secondary | ICD-10-CM | POA: Insufficient documentation

## 2013-04-29 DIAGNOSIS — F411 Generalized anxiety disorder: Secondary | ICD-10-CM | POA: Insufficient documentation

## 2013-04-29 DIAGNOSIS — I080 Rheumatic disorders of both mitral and aortic valves: Secondary | ICD-10-CM | POA: Insufficient documentation

## 2013-04-29 DIAGNOSIS — I4892 Unspecified atrial flutter: Secondary | ICD-10-CM | POA: Insufficient documentation

## 2013-04-29 DIAGNOSIS — Z886 Allergy status to analgesic agent status: Secondary | ICD-10-CM | POA: Insufficient documentation

## 2013-04-29 HISTORY — PX: TEE WITHOUT CARDIOVERSION: SHX5443

## 2013-04-29 SURGERY — ECHOCARDIOGRAM, TRANSESOPHAGEAL
Anesthesia: Moderate Sedation

## 2013-04-29 MED ORDER — LIDOCAINE VISCOUS 2 % MT SOLN
OROMUCOSAL | Status: DC | PRN
  Administered 2013-04-29: 10 mL via OROMUCOSAL

## 2013-04-29 MED ORDER — FENTANYL CITRATE 0.05 MG/ML IJ SOLN
INTRAMUSCULAR | Status: DC | PRN
  Administered 2013-04-29 (×2): 25 ug via INTRAVENOUS

## 2013-04-29 MED ORDER — FENTANYL CITRATE 0.05 MG/ML IJ SOLN
INTRAMUSCULAR | Status: AC
  Filled 2013-04-29: qty 2

## 2013-04-29 MED ORDER — SODIUM CHLORIDE 0.9 % IV SOLN
INTRAVENOUS | Status: DC
  Administered 2013-04-29: 14:00:00 via INTRAVENOUS

## 2013-04-29 MED ORDER — MIDAZOLAM HCL 10 MG/2ML IJ SOLN
INTRAMUSCULAR | Status: DC | PRN
  Administered 2013-04-29 (×2): 2 mg via INTRAVENOUS

## 2013-04-29 MED ORDER — LIDOCAINE VISCOUS 2 % MT SOLN
OROMUCOSAL | Status: AC
  Filled 2013-04-29: qty 15

## 2013-04-29 MED ORDER — MIDAZOLAM HCL 5 MG/ML IJ SOLN
INTRAMUSCULAR | Status: AC
  Filled 2013-04-29: qty 2

## 2013-04-29 NOTE — CV Procedure (Signed)
TEE performed without complication.  Normal LV function.  Mild mitral reguritation.  Aortic sclerosis.  Tr. AI.  No LA/LAA thrombus.  Mild to moderate TR.  Areas of Severe plaque in the descending aorta and arch.

## 2013-04-29 NOTE — Progress Notes (Signed)
  Echocardiogram Echocardiogram Transesophageal has been performed.  Marissa Hart 04/29/2013, 4:21 PM

## 2013-04-29 NOTE — H&P (Signed)
  Date of Initial H&P: 04/22/13  History reviewed, patient examined, no change in status, stable for TEE.  Risks and benefits explained to patient including aspiration and perforation.  All questions answered.  AFib ablation scheduled for tomorrow.

## 2013-04-30 ENCOUNTER — Encounter (HOSPITAL_COMMUNITY): Payer: Self-pay | Admitting: Anesthesiology

## 2013-04-30 ENCOUNTER — Ambulatory Visit (HOSPITAL_COMMUNITY): Payer: Medicare Other | Admitting: Anesthesiology

## 2013-04-30 ENCOUNTER — Ambulatory Visit (HOSPITAL_BASED_OUTPATIENT_CLINIC_OR_DEPARTMENT_OTHER)
Admission: RE | Admit: 2013-04-30 | Discharge: 2013-05-01 | Disposition: A | Discharge status: Home / Self Care | Payer: Medicare Other | Source: Ambulatory Visit | Attending: Internal Medicine | Admitting: Internal Medicine

## 2013-04-30 ENCOUNTER — Ambulatory Visit (HOSPITAL_COMMUNITY): Admission: RE | Admit: 2013-04-30 | Payer: Medicare Other | Source: Ambulatory Visit | Admitting: Internal Medicine

## 2013-04-30 ENCOUNTER — Encounter (HOSPITAL_COMMUNITY)
Admission: RE | Disposition: A | Discharge status: Home / Self Care | Payer: Medicare Other | Source: Ambulatory Visit | Attending: Internal Medicine

## 2013-04-30 ENCOUNTER — Encounter (HOSPITAL_COMMUNITY): Payer: Self-pay | Admitting: Pharmacy Technician

## 2013-04-30 ENCOUNTER — Encounter (HOSPITAL_COMMUNITY)
Admission: RE | Disposition: A | Discharge status: Home / Self Care | Payer: Self-pay | Source: Ambulatory Visit | Attending: Internal Medicine

## 2013-04-30 DIAGNOSIS — I4891 Unspecified atrial fibrillation: Secondary | ICD-10-CM

## 2013-04-30 DIAGNOSIS — I4892 Unspecified atrial flutter: Secondary | ICD-10-CM

## 2013-04-30 HISTORY — PX: ATRIAL FIBRILLATION ABLATION: SHX5456

## 2013-04-30 HISTORY — DX: Unspecified atrial fibrillation: I48.91

## 2013-04-30 HISTORY — DX: Unspecified atrial flutter: I48.92

## 2013-04-30 LAB — POCT ACTIVATED CLOTTING TIME: Activated Clotting Time: 155 seconds

## 2013-04-30 SURGERY — ATRIAL FIBRILLATION ABLATION
Anesthesia: Monitor Anesthesia Care

## 2013-04-30 SURGERY — ATRIAL FIBRILLATION ABLATION
Anesthesia: General

## 2013-04-30 MED ORDER — HEPARIN SODIUM (PORCINE) 1000 UNIT/ML IJ SOLN
INTRAMUSCULAR | Status: AC
  Filled 2013-04-30: qty 1

## 2013-04-30 MED ORDER — APIXABAN 5 MG PO TABS
5.0000 mg | ORAL_TABLET | Freq: Two times a day (BID) | ORAL | Status: DC
  Administered 2013-04-30 – 2013-05-01 (×2): 5 mg via ORAL
  Filled 2013-04-30 (×3): qty 1

## 2013-04-30 MED ORDER — SODIUM CHLORIDE 0.9 % IJ SOLN
3.0000 mL | Freq: Two times a day (BID) | INTRAMUSCULAR | Status: DC
  Administered 2013-04-30: 3 mL via INTRAVENOUS

## 2013-04-30 MED ORDER — SODIUM CHLORIDE 0.9 % IJ SOLN: 3.0000 mL | INTRAMUSCULAR | Status: DC | PRN

## 2013-04-30 MED ORDER — LEVOTHYROXINE SODIUM 88 MCG PO TABS
88.0000 ug | ORAL_TABLET | Freq: Every day | ORAL | Status: DC
  Administered 2013-05-01: 88 ug via ORAL
  Filled 2013-04-30 (×2): qty 1

## 2013-04-30 MED ORDER — FENTANYL CITRATE 0.05 MG/ML IJ SOLN
INTRAMUSCULAR | Status: DC | PRN
  Administered 2013-04-30: 50 ug via INTRAVENOUS

## 2013-04-30 MED ORDER — MIDAZOLAM HCL 5 MG/5ML IJ SOLN
INTRAMUSCULAR | Status: DC | PRN
  Administered 2013-04-30 (×2): 1 mg via INTRAVENOUS

## 2013-04-30 MED ORDER — SODIUM CHLORIDE 0.9 % IV SOLN
INTRAVENOUS | Status: DC | PRN
  Administered 2013-04-30: 07:00:00 via INTRAVENOUS

## 2013-04-30 MED ORDER — LEVOTHYROXINE SODIUM 88 MCG PO TABS: 88.0000 ug | ORAL_TABLET | Freq: Every day | ORAL | Status: DC

## 2013-04-30 MED ORDER — BUPIVACAINE HCL (PF) 0.25 % IJ SOLN
INTRAMUSCULAR | Status: AC
  Filled 2013-04-30: qty 30

## 2013-04-30 MED ORDER — ONDANSETRON HCL 4 MG/2ML IJ SOLN
4.0000 mg | Freq: Four times a day (QID) | INTRAMUSCULAR | Status: DC | PRN
  Administered 2013-04-30: 4 mg via INTRAVENOUS
  Filled 2013-04-30: qty 2

## 2013-04-30 MED ORDER — SODIUM CHLORIDE 0.9 % IV SOLN: 250.0000 mL | INTRAVENOUS | Status: DC | PRN

## 2013-04-30 MED ORDER — PROTAMINE SULFATE 10 MG/ML IV SOLN
INTRAVENOUS | Status: DC | PRN
  Administered 2013-04-30: 40 mg via INTRAVENOUS

## 2013-04-30 MED ORDER — HEPARIN SODIUM (PORCINE) 1000 UNIT/ML IJ SOLN
INTRAMUSCULAR | Status: DC | PRN
  Administered 2013-04-30: 12000 [IU] via INTRAVENOUS

## 2013-04-30 MED ORDER — ACETAMINOPHEN 325 MG PO TABS: 650.0000 mg | ORAL_TABLET | ORAL | Status: DC | PRN

## 2013-04-30 MED ORDER — PROPOFOL INFUSION 10 MG/ML OPTIME
INTRAVENOUS | Status: DC | PRN
  Administered 2013-04-30: 50 ug/kg/min via INTRAVENOUS

## 2013-04-30 NOTE — Preoperative (Signed)
Beta Blockers   Reason not to administer Beta Blockers:Not Applicable 

## 2013-04-30 NOTE — Transfer of Care (Signed)
Immediate Anesthesia Transfer of Care Note  Patient: Marissa Hart  Procedure(s) Performed: Procedure(s): ATRIAL FIBRILLATION ABLATION (N/A)  Patient Location: PACU  Anesthesia Type:MAC  Level of Consciousness: awake, alert  and oriented  Airway & Oxygen Therapy: Patient Spontanous Breathing  Post-op Assessment: Report given to PACU RN  Post vital signs: Reviewed and stable  Complications: No apparent anesthesia complications

## 2013-04-30 NOTE — Discharge Summary (Addendum)
ELECTROPHYSIOLOGY PROCEDURE DISCHARGE SUMMARY    Patient ID: Marissa Hart,  MRN: [id number], DOB/AGE: 01/25/1935 78 y.o.  Admit date: 04/30/2013 Discharge date: 05/01/2013  Primary Care Physician: Marissa Dust, MD Electrophysiologist: Marissa Range, MD/Marissa Graciela Husbands, MD  Primary Discharge Diagnosis:  Paroxysmal atrial fibrillation and atrial flutter  Secondary Discharge Diagnosis:  1.  AVNRT s/p ablation 2.  Hypothyroidism 3.  Fibromyalgia 4.  Hyperlipidemia  Procedures This Admission:  1.  Electrophysiology study and radiofrequency catheter ablation on 04-30-13 by Dr Marissa Hart.  This study demonstrated sinus rhythm upon presentation; rotational Angiography reveals a moderate sized left atrium with four separate pulmonary veins without evidence of pulmonary vein stenosis; successful electrical isolation and anatomical encircling of all four pulmonary veins with radiofrequency current; cavo-tricuspid isthmus ablation was performed with complete bidirectional isthmus block achieved; frequent multifocal PACs, multiple atypical atrial flutter circuits (nonsustained), and atrial tachycardia not amenable to ablation today.  There were no early apparent complications.   Brief HPI: Marissa Hart is a 78 y.o. female with a history of paroxysmal atrial fibrillation and atrial flutter.  They have failed medical therapy with Rhythmol. Risks, benefits, and alternatives to catheter ablation of atrial fibrillation were reviewed with the patient who wished to proceed.  The patient underwent TEE prior to the procedure which demonstrated normal LV function and no LAA thrombus.    Hospital Course:  The patient was admitted and underwent EPS/RFCA of atrial fibrillation with details as outlined above.  They were monitored on telemetry overnight which demonstrated sinus rhythm with PAC's.  Groin was without complication on the day of discharge.  The patient was examined by Dr Marissa Hart and considered to be  stable for discharge.  Wound care and restrictions were reviewed with the patient.  The patient will be seen back by Dr Marissa Hart in 12 weeks for post ablation follow up.   Discharge Vitals: Blood pressure 94/44, pulse 72, temperature 99.8 F (37.7 C), temperature source Oral, resp. rate 15, height 5\' 3"  (1.6 m), weight 142 lb (64.411 kg), SpO2 96.00%.   Physical Exam: Filed Vitals:   05/01/13 0411 05/01/13 0500 05/01/13 0600 05/01/13 0700  BP:   94/44   Pulse:      Temp: 99.8 F (37.7 C)     TempSrc: Oral     Resp:  16 13 15   Height:      Weight:      SpO2:        GEN- The patient is well appearing, alert and oriented x 3 today.   Head- normocephalic, atraumatic Eyes-  Sclera clear, conjunctiva pink Ears- hearing intact Oropharynx- clear Neck- supple, no JVP Lymph- no cervical lymphadenopathy Lungs- Clear to ausculation bilaterally, normal work of breathing Heart- Regular rate and rhythm, no murmurs, rubs or gallops, PMI not laterally displaced GI- soft, NT, ND, + BS Extremities- no clubbing, cyanosis, or edema, moderate R groin ecchymosis, no bruits MS- no significant deformity or atrophy Skin- no rash or lesion Psych- euthymic mood, full affect Neuro- strength and sensation are intact  Labs:   Lab Results  Component Value Date   WBC 6.5 04/22/2013   HGB 13.1 04/22/2013   HCT 39.0 04/22/2013   MCV 98.0 04/22/2013   PLT 194.0 04/22/2013     Recent Labs Lab 05/01/13 0450  NA 133*  K 3.8  CL 100  CO2 26  BUN 11  CREATININE 0.77  CALCIUM 8.0*  GLUCOSE 115*      Discharge Medications:    Medication  List         apixaban 5 MG Tabs tablet  Commonly known as:  ELIQUIS  Take 1 tablet (5 mg total) by mouth 2 (two) times daily.     docusate sodium 100 MG capsule  Commonly known as:  COLACE  Take 100 mg by mouth 3 (three) times daily.     estradiol 0.05 MG/24HR patch  Commonly known as:  VIVELLE-DOT  Place 1 patch onto the skin 2 (two) times a week.     fish  oil-omega-3 fatty acids 1000 MG capsule  Take 1 g by mouth daily.     levothyroxine 88 MCG tablet  Commonly known as:  SYNTHROID, LEVOTHROID  Take 1 tablet (88 mcg total) by mouth daily.     multivitamin with minerals tablet  Take 1 tablet by mouth daily.     pantoprazole 40 MG tablet  Commonly known as:  PROTONIX  Take 1 tablet (40 mg total) by mouth daily.     psyllium 58.6 % powder  Commonly known as:  METAMUCIL  Take 1 packet by mouth daily.     verapamil 120 MG CR tablet  Commonly known as:  CALAN-SR  Take 120 mg by mouth daily as needed. For heart palpitations     vitamin B-12 1000 MCG tablet  Commonly known as:  CYANOCOBALAMIN  Take 1,000 mcg by mouth daily. Takes liquid form     vitamin C 500 MG tablet  Commonly known as:  ASCORBIC ACID  Take 500 mg by mouth daily.     Vitamin D 1000 UNITS capsule  Take 1,000 Units by mouth daily.        Disposition:   Future Appointments Provider Department Dept Phone   07/29/2013 9:15 AM Marissa Range, MD [place] [facility] Main Office [place]) [phone]     Follow-up Information   Follow up with Marissa Range, MD On 07/29/2013. (9:15)    Specialty:  Cardiology   Contact information:   [address] [place] [address] [phone]       Duration of Discharge Encounter: Greater than 30 minutes including physician time.  Signed,  Marissa Range, MD

## 2013-04-30 NOTE — Anesthesia Preprocedure Evaluation (Signed)
Anesthesia Evaluation  Patient identified by MRN, date of birth, ID band Patient awake    Reviewed: Allergy & Precautions, H&P , NPO status , Patient's Chart, lab work & pertinent test results, reviewed documented beta blocker date and time   Airway Mallampati: II TM Distance: >3 FB Neck ROM: full    Dental   Pulmonary neg pulmonary ROS,  breath sounds clear to auscultation        Cardiovascular + dysrhythmias Atrial Fibrillation and Supra Ventricular Tachycardia Rhythm:regular     Neuro/Psych  Neuromuscular disease negative neurological ROS  negative psych ROS   GI/Hepatic Neg liver ROS, hiatal hernia, GERD-  Medicated and Controlled,  Endo/Other  negative endocrine ROSHypothyroidism   Renal/GU Renal disease  negative genitourinary   Musculoskeletal   Abdominal   Peds  Hematology negative hematology ROS (+)   Anesthesia Other Findings See surgeon's H&P   Reproductive/Obstetrics negative OB ROS                           Anesthesia Physical Anesthesia Plan  ASA: III  Anesthesia Plan: General   Post-op Pain Management:    Induction: Intravenous  Airway Management Planned: LMA  Additional Equipment:   Intra-op Plan:   Post-operative Plan:   Informed Consent: I have reviewed the patients History and Physical, chart, labs and discussed the procedure including the risks, benefits and alternatives for the proposed anesthesia with the patient or authorized representative who has indicated his/her understanding and acceptance.   Dental Advisory Given  Plan Discussed with: CRNA and Surgeon  Anesthesia Plan Comments:         Anesthesia Quick Evaluation

## 2013-04-30 NOTE — Progress Notes (Addendum)
At 1730, patient up to potty chair, 6 hours post sheath removal. Right groin remained level 0 with small soft labial hematoma noted. Patient tolerated getting up well with vital remaining stable. RN noted small venous ooze under femoral dressing and manual pressure held. Dr. Johney Frame at bedside. Patient became diaphoretic and hypotensive as vagal response suspected. Denied complaints of pain. 250cc fluid bolus given per emergiency protocol and zofran 4mg  given for nausea. Patients pressure recovered and states "feeling much better" at current time. Right groin remains level 0. Patient denies complaints. Occlusive dsg reapplied post hemostasis. Will continue to monitor patient closely and notify MD as needed. Rhonda Barrett notified.

## 2013-04-30 NOTE — Interval H&P Note (Signed)
History and Physical Interval Note:  04/30/2013 7:29 AM  Tennis Ship  has presented today for surgery, with the diagnosis of afib  The various methods of treatment have been discussed with the patient and family. After consideration of risks, benefits and other options for treatment, the patient has consented to  Procedure(s): ATRIAL FIBRILLATION ABLATION (N/A) as a surgical intervention .  The patient's history has been reviewed, patient examined, no change in status, stable for surgery.  I have reviewed the patient's chart and labs.  Questions were answered to the patient's satisfaction.     Hillis Range

## 2013-04-30 NOTE — H&P (View-Only) (Signed)
Primary Care Physician: Marissa Mcalpine, MD Referring Physician: Sherryl Manges, MD   Marissa Hart is a 78 y.o. female with a h/o hypothyroidism, fibromyalgia, hyperlipidemia and atrial arrhythmias.  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  For the last 5 years, she has been having increasing problems with palpitations and dizziness.  She was seen by Dr Marissa Hart and wore an event monitor which demonstrated PAC's, atrial tachycardia, atrial flutter and atrial fibrillation.  She was placed on Propafenone but was unable to tolerate this medication due to increased dizziness.  She did not feel like the Propafenone helped with her arrhythmia burden.  She was then given prn Verapamil but she has not taken this medication.   Echo in July of this year demonstrated an EF of 55-65% with mild focal basal hypertrophy of the septum and no RWMA. LA 34mm.   Today, she denies symptoms of  chest pain, shortness of breath, orthopnea, PND, lower extremity edema, presyncope, syncope, or neurologic sequela. The patient is tolerating medications without difficulties and is otherwise without complaint today.   Past Medical History  Diagnosis Date  . Arthritis   . Blood transfusion      and  . GERD (gastroesophageal reflux disease)   . Hyperlipidemia   . Thyroid disease   . Cataract   . Palpitations   . Supraventricular tachycardia     AV nodal reentry  s/p RFCA 2001  . Hiatal hernia   . Diverticulosis of colon   . Hx of colonic polyps   . Acquired renal cyst of right kidney   . Anxiety   . Erysipelas   . Fibromyalgia    Past Surgical History  Procedure Laterality Date  . Abdominal hysterectomy    . Oophorectomy    . Breast biopsie      benign lt. breast  . Colonoscopy    . Colonoscopy    . Upper gastrointestinal endoscopy    . Esophagogastroduodenoscopy    . Cataract extraction bilateral w/ anterior vitrectomy    . Rotator cuff repair  08/2001    Dr. Darrelyn Hillock  .  Breast biopsiy      2011-benign  . Knee arthroscopy  2012    Current Outpatient Prescriptions  Medication Sig Dispense Refill  . apixaban (ELIQUIS) 5 MG TABS tablet Take 1 tablet (5 mg total) by mouth 2 (two) times daily.  30 tablet  11  . Cholecalciferol (VITAMIN D) 1000 UNITS capsule Take 1,000 Units by mouth daily.        [name] Kitchen docusate sodium (COLACE) 100 MG capsule Take 100 mg by mouth daily. Takes 3 daily       . estradiol (VIVELLE-DOT) 0.05 MG/24HR Place 1 patch onto the skin 2 (two) times a week.        . fish oil-omega-3 fatty acids 1000 MG capsule Take 2 g by mouth daily.        [name] Kitchen levothyroxine (SYNTHROID, LEVOTHROID) 88 MCG tablet Take 1 tablet (88 mcg total) by mouth daily.  90 tablet  3  . Multiple Vitamins-Minerals (MULTIVITAMIN WITH MINERALS) tablet Take 1 tablet by mouth daily.        . psyllium (METAMUCIL) 58.6 % powder Take 1 packet by mouth daily.        . Red Yeast Rice 600 MG CAPS Take 1 capsule by mouth daily.      . verapamil (CALAN-SR) 120 MG CR tablet Take one tablet by mouth daily as needed  30 tablet  3  . vitamin B-12 (CYANOCOBALAMIN) 1000 MCG tablet Take 1,000 mcg by mouth daily. Takes liquid form       . vitamin C (ASCORBIC ACID) 500 MG tablet Take 500 mg by mouth daily.       No current facility-administered medications for this visit.    Allergies  Allergen Reactions  . Codeine     REACTION: nausea/dizziness  . Codeine Phosphate     REACTION: nausea,dizzy  . Tramadol Hcl     REACTION: nausea--dizzy    History   Social History  . Marital Status: Married    Spouse Name: N/A    Number of Children: 1  . Years of Education: N/A   Occupational History  .     Social History Main Topics  . Smoking status: Former Smoker -- 3 years    Types: Cigarettes    Quit date: 09/19/1966  . Smokeless tobacco: Never Used  . Alcohol Use: 1.8 oz/week    3 Glasses of wine per week     Comment: wine weekly  . Drug Use: No  . Sexually Active: Not on file    Other Topics Concern  . Not on file   Social History Narrative  . No narrative on file    No family history on file.  ROS- All systems are reviewed and negative except as per the HPI above  Physical Exam: Filed Vitals:   04/22/13 0917  BP: 120/74  Pulse: 76  Height: 5\' 3"  (1.6 m)  Weight: 144 lb 3.2 oz (65.409 kg)    GEN- The patient is well appearing, alert and oriented x 3 today.   Head- normocephalic, atraumatic Eyes-  Sclera clear, conjunctiva pink Ears- hearing intact Oropharynx- clear Neck- supple, no JVP Lymph- no cervical lymphadenopathy Lungs- Clear to ausculation bilaterally, normal work of breathing Heart- Regular rate and rhythm, no murmurs, rubs or gallops, PMI not laterally displaced GI- soft, NT, ND, + BS Extremities- no clubbing, cyanosis, or edema MS- no significant deformity or atrophy Skin- no rash or lesion Psych- euthymic mood, full affect Neuro- strength and sensation are intact  EKG- sinus rhythm , RsR' Echo normal  Assessment and Plan:  1. Paroxysmal atrial fibrillation, atrial flutter, atrial tachycardia, PACs The patient has been intolerant to verapamil and rhythmol. Therapeutic strategies for atrial arrhythmias including medicine and ablation were discussed in detail with the patient today. Risk, benefits, and alternatives to EP study and radiofrequency ablation for afib were also discussed in detail today. I discussed multaq or tikosyn and potential options.  These risks include but are not limited to stroke, bleeding, vascular damage, tamponade, perforation, damage to the esophagus, lungs, and other structures, pulmonary vein stenosis, worsening renal function, and death. The patient understands these risk and wishes to proceed.  We will therefore proceed with catheter ablation at the next available time.  She will need a TEE within 24 hours of the procedure.

## 2013-04-30 NOTE — Op Note (Signed)
SURGEON:  Hillis Range, MD  PREPROCEDURE DIAGNOSES: 1. Paroxysmal atrial fibrillation. 2. Typical appearing atrial flutter  POSTPROCEDURE DIAGNOSES: 1. Paroxysmal  atrial fibrillation. 2. Isthmus dependant right atrial flutter 3. Multiple left atrial flutter circuits 4. Atrial tachycardia  PROCEDURES: 1. Comprehensive electrophysiologic study. 2. Coronary sinus pacing and recording. 3. Three-dimensional mapping of atrial fibrillation with additional mapping and ablation of a second discrete focus (atrial flutter) 4. Ablation of atrial fibrillation with additional mapping and ablation of a second discrete focus (atrial flutter) 5.  Intracardiac echocardiography. 7. Transseptal puncture of an intact septum. 8. Rotational Angiography with processing at an independent workstation  INTRODUCTION:  Marissa Hart is a 78 y.o. female with a history of paroxysmal atrial fibrillation and typical appearing atrial flutter who now presents for EP study and radiofrequency ablation.  The patient reports initially being diagnosed with atrial fibrillation after presenting with symptomatic palpitations and fatgiue. The patient reports increasing frequency and duration of atrial arrhythmias since that time.  The patient has failed medical therapy with Rhythmol.  The patient therefore presents today for catheter ablation of atrial fibrillation and atrial flutter.  DESCRIPTION OF PROCEDURE:  Informed written consent was obtained, and the patient was brought to the electrophysiology lab in a fasting state.  The patient was adequately sedated with intravenous medications as outlined in the anesthesia report.  The patient's left and right groins were prepped and draped in the usual sterile fashion by the EP lab staff.  Using a percutaneous Seldinger technique, two 7-French and one 11-French hemostasis sheaths were placed into the right common femoral vein.    3 Dimensional Rotational Angiography: A 5 french  pigtail catheter was introduced through the right common femoral vein and advanced into the inferior venocava.  3 demential rotational angiography was then performed by power injection of 100cc of nonionic contrast.  Reprocessing at an independent work station was then performed.   This demonstrated a moderate sized left atrium with 4 separate pulmonary veins which were also moderate in size.  There were no anomalous veins or significant abnormalities.  A 3 dimensional rendering of the left atrium was then merged using [employer] onto the [employer] system and registered with intracardiac echo (see below).  The pigtail catheter was then removed.  Catheter Placement:  A 7-French [employer] Decapolar coronary sinus catheter was introduced through the right common femoral vein and advanced into the coronary sinus for recording and pacing from this location.  A quadripolar catheter was introduced through the right common femoral vein and advanced into the right ventricle for recording and pacing.  This catheter was then pulled back to the His bundle location.    Initial Measurements: The patient presented to the electrophysiology lab in sinus rhythm.  Her PR interval measured 198 with a QRS duringation of 122 and a QT interval of 450 msec.  The AH interval measured 98 and the HV interval measured 44 msec.   The patient was observed to have very frequent multifocal PACs, atrial tachycardia (CL ), and multiple atrial flutter circuits (both right and left atrial).  These were spontaneous atrial flutters of 3 different cycle lengths with different CS activation sequences.  Intracardiac Echocardiography: A 10-French [employer] AcuNav intracardiac echocardiography catheter was introduced through the left common femoral vein and advanced into the right atrium. Intracardiac echocardiography was performed of the left atrium, and a three-dimensional anatomical rendering of the left  atrium was performed using CARTO sound technology.  The patient was noted  to have a moderate sized left atrium.  The interatrial septum was prominent but not aneurysmal. All 4 pulmonary veins were visualized and noted to have separate ostia.  The pulmonary veins were moderate in size.  The left atrial appendage was visualized and did not reveal thrombus.   There was no evidence of pulmonary vein stenosis.   Transseptal Puncture: The middle right common femoral vein sheath was exchanged for an 8.5 [place] SL2 transseptal sheath and transseptal access was achieved in a standard fashion using a Brockenbrough needle under biplane fluoroscopy with intracardiac echocardiography confirmation of the transseptal puncture.  Once transseptal access had been achieved, heparin was administered intravenously and intra- arterially in order to maintain an ACT of greater than 350 seconds throughout the procedure.   3D Mapping and Ablation: The His bundle catheter was removed and in its place a 3.5 mm [employer] ablation catheter was advanced into the right atrium.  The transseptal sheath was pulled back into the IVC over a guidewire.  The ablation catheter was advanced across the transseptal hole using the wire as a guide.  The transseptal sheath was then re-advanced over the guidewire into the left atrium.  A duodecapolar [employer] circular mapping catheter was introduced through the transseptal sheath and positioned over the mouth of all 4 pulmonary veins.  Three-dimensional electroanatomical mapping was performed using CARTO technology.  This demonstrated electrical activity within all four pulmonary veins at baseline. The patient underwent successful sequential electrical isolation and anatomical encircling of all four pulmonary veins using radiofrequency current with a circular mapping catheter as a guide.  The ablation catheter was then pulled back into the right atrial and positioned  along the cavo-tricuspid isthmus.  Mapping along the atrial side of the isthmus was performed.  This demonstrated a standard isthmus.  A series of radiofrequency applications were then delivered along the isthmus.  Complete bidirectional cavotricuspid isthmus block was achieved as confirmed by differential atrial pacing from the low lateral right atrium.  A stimulus to earliest atrial activation across the isthmus measured 170 msec bi-directionally.  The patient was observe without return of conduction through the isthmus.  Measurements Following Ablation: Following ablation, the patient was observed to have sustained atrial tachycardia at 490 msec.  Unfortunately with 3D mapping within the right atrium, the atrial tachycardia would terminate.  Though the tachycardia was easily induced, it was not amenable to mapping or ablation today due to its termination with catheter manipulation within the atrium. In sinus rhythm with RR interval was 770, with PR , QRS 115 msec, and Qtc 427 msec.  Following ablation the AH interval measured with an HV interval of 40 msec.  Rapid atrial pacing was performed, which revealed an AV Wenckebach cycle length of 460 msec.  Electroisolation was then again confirmed in all four pulmonary veins.  Intracardiac echocardiography was again performed, which revealed no pericardial effusion.  The procedure was therefore considered completed.  All catheters were removed, and the sheaths were aspirated and flushed.  The patient was transferred to the recovery area for sheath removal per protocol.  A limited bedside transthoracic echocardiogram revealed no pericardial effusion.  There were no early apparent complications.  CONCLUSIONS: 1. Sinus rhythm upon presentation.   2. Rotational Angiography reveals a moderate sized left atrium with four separate pulmonary veins without evidence of pulmonary vein stenosis. 3. Successful electrical isolation and anatomical encircling  of all four pulmonary veins with radiofrequency current. 4. Cavo-tricuspid isthmus ablation was performed with  complete bidirectional isthmus block achieved.  5. Frequent multifocal PACs, multiple atypical atrial flutter circuits (nonsustained), and atrial tachycardia not amenable to ablation today 6. No early apparent complications.   Fayrene Fearing Korene Dula,MD 10:53 AM 04/30/2013

## 2013-05-01 ENCOUNTER — Encounter (HOSPITAL_COMMUNITY): Payer: Self-pay | Admitting: *Deleted

## 2013-05-01 DIAGNOSIS — I4891 Unspecified atrial fibrillation: Secondary | ICD-10-CM

## 2013-05-01 LAB — BASIC METABOLIC PANEL
BUN: 11 mg/dL (ref 6–23)
CO2: 26 mEq/L (ref 19–32)
Chloride: 100 mEq/L (ref 96–112)
Creatinine, Ser: 0.77 mg/dL (ref 0.50–1.10)
GFR calc Af Amer: >90 mL/min (ref >90)
Glucose, Bld: 115 mg/dL — ABNORMAL HIGH (ref 70–99)

## 2013-05-01 MED ORDER — PANTOPRAZOLE SODIUM 40 MG PO TBEC: 40.0000 mg | DELAYED_RELEASE_TABLET | Freq: Every day | ORAL | Status: DC

## 2013-05-01 NOTE — Progress Notes (Signed)
Reviewed discharge information with patient at bedside. Provided her with copies of discharge information as well as care notes for ablation and homecare. Removed IV to left hand with catheter intact and dressing applied. Also changed right groin dressing and assessed site. Washed area with mild soap and water and patted dry. No active bleeding noted and reviewed instructions for care of site when she arrives home. Reviewed patients discharge medications, follow-up appointment, and provided her with copy of all instructions. She verbalized understanding and had no current questions. Patient now getting dressed and awaiting ride to arrive for discharge home. Will continue to monitor.

## 2013-05-01 NOTE — Progress Notes (Signed)
Patient now dressed and her ride has arrived and is now being taken to the front lobby for discharge home.

## 2013-05-01 NOTE — Anesthesia Postprocedure Evaluation (Signed)
Anesthesia Post Note  Patient: Marissa Hart  Procedure(s) Performed: Procedure(s) (LRB): ATRIAL FIBRILLATION ABLATION (N/A)  Anesthesia type: MAC  Patient location: PACU  Post pain: Pain level controlled  Post assessment: Patient's Cardiovascular Status Stable  Last Vitals:  Filed Vitals:   05/01/13 0600  BP: 94/44  Pulse:   Temp:   Resp: 13    Post vital signs: Reviewed and stable  Level of consciousness: alert  Complications: No apparent anesthesia complications

## 2013-06-17 ENCOUNTER — Ambulatory Visit (INDEPENDENT_AMBULATORY_CARE_PROVIDER_SITE_OTHER): Payer: Medicare Other

## 2013-06-17 DIAGNOSIS — Z23 Encounter for immunization: Secondary | ICD-10-CM

## 2013-06-24 ENCOUNTER — Other Ambulatory Visit: Payer: Self-pay | Admitting: *Deleted

## 2013-06-24 MED ORDER — APIXABAN 5 MG PO TABS: 5.0000 mg | ORAL_TABLET | Freq: Two times a day (BID) | ORAL | Status: DC

## 2013-07-29 ENCOUNTER — Encounter: Payer: Self-pay | Admitting: Internal Medicine

## 2013-07-29 ENCOUNTER — Ambulatory Visit (INDEPENDENT_AMBULATORY_CARE_PROVIDER_SITE_OTHER): Payer: Medicare Other | Admitting: Internal Medicine

## 2013-07-29 VITALS — BP 127/79 | HR 74 | Ht 63.0 in | Wt 143.6 lb

## 2013-07-29 DIAGNOSIS — I4891 Unspecified atrial fibrillation: Secondary | ICD-10-CM

## 2013-07-29 NOTE — Patient Instructions (Signed)
Your physician recommends that you schedule a follow-up appointment in 3 months with Dr Allred    

## 2013-07-29 NOTE — Progress Notes (Signed)
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 female who presents today for routine electrophysiology followup.  Since her recent afib ablation, the patient reports doing very well.   She denies procedure related complications and is pleased with the results of her procedure.  Today, she denies symptoms of palpitations, chest pain, shortness of breath,  lower extremity edema, dizziness, presyncope, or syncope.  The patient is otherwise without complaint today.   Past Medical History  Diagnosis Date  . Arthritis   . Blood transfusion      and  . GERD (gastroesophageal reflux disease)   . Hyperlipidemia   . Thyroid disease   . Cataract   . Atrial fibrillation and flutter     s/p PVI and CTI by Dr Johney Frame 04/2013  . Supraventricular tachycardia     AV nodal reentry  s/p RFCA 2001  . Hiatal hernia   . Diverticulosis of colon   . Hx of colonic polyps   . Acquired renal cyst of right kidney   . Anxiety   . Erysipelas   . Fibromyalgia    Past Surgical History  Procedure Laterality Date  . Abdominal hysterectomy    . Oophorectomy    . Breast biopsie      benign lt. breast  . Colonoscopy    . Colonoscopy    . Upper gastrointestinal endoscopy    . Esophagogastroduodenoscopy    . Cataract extraction bilateral w/ anterior vitrectomy    . Rotator cuff repair  08/2001    Dr. Darrelyn Hillock  . Breast biopsiy      2011-benign  . Knee arthroscopy  2012  . Tee without cardioversion N/A 04/29/2013    Procedure: TRANSESOPHAGEAL ECHOCARDIOGRAM (TEE);  Surgeon: Corky Crafts, MD;  Location: [facility] ENDOSCOPY;  Service: Cardiovascular;  Laterality: N/A;  . Ablation  04-29-2013    PVI and CTI by Dr Johney Frame     Current Outpatient Prescriptions  Medication Sig Dispense Refill  . apixaban (ELIQUIS) 5 MG TABS tablet Take 1 tablet (5 mg total) by mouth 2 (two) times daily.  60 tablet  6  . Cholecalciferol (VITAMIN D) 1000 UNITS capsule Take 1,000 Units by mouth  daily.        [name] Kitchen docusate sodium (COLACE) 100 MG capsule Take 100 mg by mouth 3 (three) times daily.       [name] Kitchen estradiol (VIVELLE-DOT) 0.05 MG/24HR Place 1 patch onto the skin 2 (two) times a week.        . fish oil-omega-3 fatty acids 1000 MG capsule Take 1 g by mouth daily.       [name] Kitchen levothyroxine (SYNTHROID, LEVOTHROID) 88 MCG tablet Take 1 tablet (88 mcg total) by mouth daily.  90 tablet  3  . Multiple Vitamins-Minerals (MULTIVITAMIN WITH MINERALS) tablet Take 1 tablet by mouth daily.        . psyllium (METAMUCIL) 58.6 % powder Take 1 packet by mouth daily.        . verapamil (CALAN-SR) 120 MG CR tablet Take 120 mg by mouth daily as needed. For heart palpitations      . vitamin B-12 (CYANOCOBALAMIN) 1000 MCG tablet Take 1,000 mcg by mouth daily. Takes liquid form       . vitamin C (ASCORBIC ACID) 500 MG tablet Take 500 mg by mouth daily.       No current facility-administered medications for this visit.    Physical Exam: Filed Vitals:   07/29/13 [phone]  BP: 127/79  Pulse: 74  Height: 5\' 3"  (1.6 m)  Weight: 143 lb 9.6 oz (65.137 kg)    GEN- The patient is well appearing, alert and oriented x 3 today.   Head- normocephalic, atraumatic Eyes-  Sclera clear, conjunctiva pink Ears- hearing intact Oropharynx- clear Lungs- Clear to ausculation bilaterally, normal work of breathing Heart- Regular rate and rhythm, no murmurs, rubs or gallops, PMI not laterally displaced GI- soft, NT, ND, + BS Extremities- no clubbing, cyanosis, or edema  ekg today reveals sinus rhythm 74 bpm, otherwise normal ekg  Assessment and Plan:  1. afib Doing well s/p ablation Maintaining sinus rhythm off of AAD therapy Continue eliquis  Return in 3 months

## 2013-10-06 ENCOUNTER — Encounter: Payer: Self-pay | Admitting: Pulmonary Disease

## 2013-10-06 DIAGNOSIS — E7800 Pure hypercholesterolemia, unspecified: Secondary | ICD-10-CM

## 2013-10-06 DIAGNOSIS — Z862 Personal history of diseases of the blood and blood-forming organs and certain disorders involving the immune mechanism: Secondary | ICD-10-CM

## 2013-10-06 DIAGNOSIS — Z8639 Personal history of other endocrine, nutritional and metabolic disease: Secondary | ICD-10-CM

## 2013-10-06 DIAGNOSIS — E039 Hypothyroidism, unspecified: Secondary | ICD-10-CM

## 2013-10-06 DIAGNOSIS — F411 Generalized anxiety disorder: Secondary | ICD-10-CM

## 2013-10-14 ENCOUNTER — Other Ambulatory Visit (INDEPENDENT_AMBULATORY_CARE_PROVIDER_SITE_OTHER): Payer: Medicare Other

## 2013-10-14 DIAGNOSIS — Z8639 Personal history of other endocrine, nutritional and metabolic disease: Secondary | ICD-10-CM

## 2013-10-14 DIAGNOSIS — E7800 Pure hypercholesterolemia, unspecified: Secondary | ICD-10-CM

## 2013-10-14 DIAGNOSIS — E039 Hypothyroidism, unspecified: Secondary | ICD-10-CM

## 2013-10-14 DIAGNOSIS — F411 Generalized anxiety disorder: Secondary | ICD-10-CM

## 2013-10-14 DIAGNOSIS — Z862 Personal history of diseases of the blood and blood-forming organs and certain disorders involving the immune mechanism: Secondary | ICD-10-CM

## 2013-10-14 LAB — CBC WITH DIFFERENTIAL/PLATELET
BASOS PCT: 0.5 % (ref 0.0–3.0)
Basophils Absolute: 0 10*3/uL (ref 0.0–0.1)
EOS ABS: 0.2 10*3/uL (ref 0.0–0.7)
Eosinophils Relative: 3.1 % (ref 0.0–5.0)
HCT: 39.7 % (ref 36.0–46.0)
HEMOGLOBIN: 13.6 g/dL (ref 12.0–15.0)
LYMPHS PCT: 19.9 % (ref 12.0–46.0)
Lymphs Abs: 1.4 10*3/uL (ref 0.7–4.0)
MCHC: 34.3 g/dL (ref 30.0–36.0)
MCV: 94.3 fl (ref 78.0–100.0)
Monocytes Absolute: 0.5 10*3/uL (ref 0.1–1.0)
Monocytes Relative: 7 % (ref 3.0–12.0)
NEUTROS ABS: 5 10*3/uL (ref 1.4–7.7)
Neutrophils Relative %: 69.5 % (ref 43.0–77.0)
Platelets: 182 10*3/uL (ref 150.0–400.0)
RBC: 4.21 Mil/uL (ref 3.87–5.11)
RDW: 13.8 % (ref 11.5–14.6)
WBC: 7.2 10*3/uL (ref 4.5–10.5)

## 2013-10-14 LAB — LIPID PANEL
CHOL/HDL RATIO: 4
Cholesterol: 151 mg/dL (ref 0–200)
HDL: 36.5 mg/dL — AB (ref >39.00)
LDL CALC: 88 mg/dL (ref 0–99)
TRIGLYCERIDES: 133 mg/dL (ref 0.0–149.0)
VLDL: 26.6 mg/dL (ref 0.0–40.0)

## 2013-10-14 LAB — BASIC METABOLIC PANEL
BUN: 11 mg/dL (ref 6–23)
CALCIUM: 8.9 mg/dL (ref 8.4–10.5)
CO2: 28 meq/L (ref 19–32)
CREATININE: 0.8 mg/dL (ref 0.4–1.2)
Chloride: 105 mEq/L (ref 96–112)
GFR: 79.67 mL/min (ref >60.00)
Glucose, Bld: 89 mg/dL (ref 70–99)
Potassium: 4.2 mEq/L (ref 3.5–5.1)
Sodium: 138 mEq/L (ref 135–145)

## 2013-10-14 LAB — HEPATIC FUNCTION PANEL
ALT: 15 U/L (ref 0–35)
AST: 15 U/L (ref 0–37)
Albumin: 3.9 g/dL (ref 3.5–5.2)
Alkaline Phosphatase: 96 U/L (ref 39–117)
BILIRUBIN DIRECT: 0.1 mg/dL (ref 0.0–0.3)
BILIRUBIN TOTAL: 0.7 mg/dL (ref 0.3–1.2)
TOTAL PROTEIN: 6.7 g/dL (ref 6.0–8.3)

## 2013-10-14 LAB — TSH: TSH: 0.45 u[IU]/mL (ref 0.35–5.50)

## 2013-10-21 ENCOUNTER — Ambulatory Visit (INDEPENDENT_AMBULATORY_CARE_PROVIDER_SITE_OTHER): Payer: Medicare Other | Admitting: Pulmonary Disease

## 2013-10-21 ENCOUNTER — Encounter: Payer: Self-pay | Admitting: Pulmonary Disease

## 2013-10-21 VITALS — BP 120/78 | HR 73 | Temp 98.6°F | Ht 62.5 in | Wt 141.8 lb

## 2013-10-21 DIAGNOSIS — K449 Diaphragmatic hernia without obstruction or gangrene: Secondary | ICD-10-CM

## 2013-10-21 DIAGNOSIS — E039 Hypothyroidism, unspecified: Secondary | ICD-10-CM

## 2013-10-21 DIAGNOSIS — K5730 Diverticulosis of large intestine without perforation or abscess without bleeding: Secondary | ICD-10-CM

## 2013-10-21 DIAGNOSIS — M1990 Unspecified osteoarthritis, unspecified site: Secondary | ICD-10-CM

## 2013-10-21 DIAGNOSIS — I4891 Unspecified atrial fibrillation: Secondary | ICD-10-CM

## 2013-10-21 DIAGNOSIS — I059 Rheumatic mitral valve disease, unspecified: Secondary | ICD-10-CM

## 2013-10-21 DIAGNOSIS — E7800 Pure hypercholesterolemia, unspecified: Secondary | ICD-10-CM

## 2013-10-21 DIAGNOSIS — F411 Generalized anxiety disorder: Secondary | ICD-10-CM

## 2013-10-21 DIAGNOSIS — D126 Benign neoplasm of colon, unspecified: Secondary | ICD-10-CM

## 2013-10-21 MED ORDER — LEVOTHYROXINE SODIUM 88 MCG PO TABS: 88.0000 ug | ORAL_TABLET | Freq: Every day | ORAL | Status: DC

## 2013-10-21 NOTE — Progress Notes (Signed)
Subjective:     Patient ID: Marissa Hart, female   DOB: 1935-03-17, 79 y.o.   MRN: [id number]  HPI 79 y/o WF here for a follow up visit... he has multiple medical problems as noted below...    ~  October 18, 2011:  Yearly ROV & she reports a good yr other than incr stress w/ Herb's illnesses (mild stoke- Aggrenox, then GI bleed- stopped Aggrenox, continued LBP & only taking ASA81 for stroke prophylaxis now)...  She notes some nasal congestion/ pressure> rec to take Mucinex, Fluids, Nasal saline... Reviewed meds> lots of vits, supplements & 2 Rx= Vivelle & Synthroid...    She denies CP, palpit, dizzy, SOB, edema; states palpit are decr since stopping Aleve...     Had mammograms, MRI, breast bx 2012> all benign...    Chol is similar on supplements RYR, FishOil> she prefers to continue diet management...    Thyroid is stable on the Synthroid88 & she is happy w/ the TSH down to 0.82    GI is stable & asymptomatic off meds> states insurance wouldn't cover Colon check x in 19yr from prev (ie- 2016) even w/ adenoma removed 2003...    [facility] feels [facility] is improved...  ~  October 18, 2012:  Yearly RWaubunnotes that overall she is doing well but she has several problems/ concerns today:  1) occas palpit & as noted she has hx ?MVP, +SVT w/ ablation in 2001, she was on Metop25 for yrs but she stopped it on her own, she prev noted that stopping Aleve seemed to help her palpit, she knows to elim caffeine etc;  Exam is neg today- w/o msc & w/o arrhythmias;  She does not want to try a diff BBlocker- requests appt w/ DrAllred for further eval & review..[name] KitchenMarland Kitchen 2) she notes some incr right knee pain & swelling after a recent fall, had to give up tennis, hx prev right knee arthroscopy;  She would like knee re-eval by DrOlin/ Alusio...     Cards- HxCWP, ?MVP, HxSVT w/ ablation> on ASA81; as above & we will refer to DrAllred for EP consult...    CHOL> on FishOil & RYR;  off prev Prav40 & doesn't want to restart statin rx; FLP 1/14 shows TChol 193, TG 156, HDL 38, LDL 123 (she blames recent steak dinner) and does not want med rx for her lipid problem...    HYPOTHYROID> on Synthroid88; clinically & biochem euthyroid w/ 1/14 TSH= 1.96    GI- HH, Divertics, Polyps> on Colace, Metamucil, Probiotic; followed by DrPatterson w/ 124mtubular adenoma removed 2003 & subseq neg colon 2006 (x divertics); she is overdue for f/u colon but says insurance won't pay until 2016(?); she is rec to f/u w/ DrPatterson to see what he recommends...    DJD> as above & she would like right knee consultation w/ DrOlin or Alusio...    Anxiety> under stress caring for her 8925/o husb... We reviewed prob list, meds, xrays and labs> see below for updates >>  LABS 1/14:  FLP- not at goals on diet alone (she declines med rx);  Chems- wnl;  CBC- wnl;  TSH=1.96;  UA- clear...  ~  October 21, 2013:  Yearly ROV     Cards- HxCWP, ?MVP, HxSVT w/ ablation> followed by DrAllred now on Eliquis5Bid & lanSR120 prn (hasn't needed); notes from DrAllred reviewed...    CHOL> on FishOil & RYR; off prev Prav40 & doesn't want  to restart statin rx; FLP 1/15 shows TChol 151, TG 133, HDL 37, LDL 88 which is much improved...    HYPOTHYROID> on Synthroid88; clinically & biochem euthyroid w/ 1/15 TSH= 0.45    GI- HH, Divertics, Polyps> on Colace, Metamucil, Probiotic; followed by DrPatterson w/ 20m tubular adenoma removed 2003 & subseq neg colon 2006 (x divertics); she is overdue for f/u colon but says insurance won't pay until 2016(?); she is rec to f/u w/ GI to see what they recommend...    DJD> as above & she reports knees are better on their own & rubbing castor oil into her knees helps she says...    Anxiety> under stress caring for her 77y/o husb... We reviewed prob list, meds, xrays and labs> see below for updates >> meds refilled per request...            Problem List:  CHEST WALL PAIN, HX OF (ICD-V15.89) -  she takes ASA 826md... ~  11/10: she request hs-CRP test & we discussed this... it ret elevated= 6.4 & she will repeat it & discuss poss of further cardiac eval w/ DrTaylor... ~  CXR 11/10 showed normal heart size & clear lungs... ~  CXR 1/13 showed clear lungs, normal heart size, NAD...[name] KitchenMarland Kitchen? of MITRAL VALVE PROLAPSE (ICD-424.0) - baseline EKG is WNL...[name] Kitchencan't find prev 2DEcho ([facility]  SUPRAVENTRICULAR TACHYCARDIA (ICD-427.89) - hx SVT w/ electrophysiologic studies by DrTaylor in 2001 w/ successful RF ablation... he placed her on Metoprolol 2575mhs but she has since stopped this on her own. ~  8/10:  f/u note from DrTaylor reviewed. ~  2012:  she states palpit have decreased since she stopped Aleve. ~  1/14:  She is c/o occas palpit- skipping, flutter sensation, self-limited, despite not taking Aleve & avoiding caffeine etc; she would like EP Cards eval from DrAllred...  HYPERCHOLESTEROLEMIA (ICD-272.0) - hx of significantly elevated cholesterol values- she has refused Med Rx and tries to control this w/ diet + Fish Oil + Red Yeast Rice, etc. ~  Her baseline FLP in 3/03 showed TChol 204, TG 93, HDL 54, LDL 132 ~  FLP 10/08 showed TChol 245, TG 108, HDL 43, LDL 175... tried Zocor- stopped due to rash. ~  FLP 12/09 showed TChol 228, TG 108, HDL 35, LDL 161... rec> try Prav40/d. ~  FLP 6/10 on Prav40 showed TChol 167, TG 98, HDL 39, LDL 108... she wants off PravRx. ~  11/10 she is off the Prav40- & notes that "cholesterol is not as big a deal as they previously thought"...      FLP 11/10 showed TChol 210, TG 157, HDL 41, LDL 139... pt offered Lipid Clinic appt & she will think about it. ~  FLP 11/11 showed TChol 183, TG 100, HDL 41, LDL 122... On diet alone. ~  FLP 1/13 on diet alone showed TChol 183, TG 83, HDL 47, LDL 120... She is happy w/ these numbers & will continue diet + supplements. ~  FLP 1/14 on diet alone showed TChol 193, TG 156, HDL 38, LDL  123  HYPOTHYROIDISM (ICD-244.9) - on SYNTHROID 85m28m now... ~  labs 11/09 showed TSH= 1.17, and she wants to keep it betw 1-2... c/o cost of Synthroid, therefore we will switch her to generic Levothyroid 85mc55mand recheck TSH. ~  labs 2/10 showed TSH= 0.82 ~  labs 11/10 showed TSH= 1.07 ~  labs 11/11 showed TSH= 1.44 ~  Labs 1/13 showed TSH= 0.82 &  this is were she wants her TSH to be "I was having trouble when my TSH was >3"... ~  Labs 1/14 showed TSH= 1.96  HIATAL HERNIA (ICD-553.3) - EGD 1998 showed [place], min esophagitis... she uses Pepcid Prn... ~  11/10: pt notes incr reflux symptoms and epig discomfort- started on PROTONIX 40Qam & Pepcid Qhs... sl elevation of SGOT=56, SGPT=65> check AbdSonar & refered to GI for eval...  DIVERTICULOSIS OF COLON (ICD-562.10),  COLONIC POLYPS (ICD-211.3) - followed for GI by DrPatterson w/ last colonoscopy 4/06- divertics only & f/u planned 9yr... last polyp removed 4/03= 167mtubular adenoma... ~  1/12:  she is overdue for f/u colonoscopy but wants to call herself to set this up. ~  2013:  She states that insurance won't pay for f/u colonoscopy x every 10 yrs, asked to discuss w/ drPatterson... ~  1/14:  She again says insurance won't cover colonoscopy at this time- last colon 2006 w/ divertics only but 1226mubular adenoma removed 2003; I rec appt w/ DRP to discuss...   RENAL CYST, RIGHT (ICD-593.2) - small benign right renal cyst seen on ultrasound in 1998...  DJD >> she had right knee arthroscopy by DrGioffre (we don't have note & done prior to Epic)... ~  1/14:  She is c/o incr right knee pain after a fall; rec Ortho f/u w/ XRays 7 she would like to see DrOlin or Alusio...  FIBROMYALGIA (ICD-729.1) - she saw DrTruslow in 1997 for polyarthralgias and prob fibromylagia... treated w/ Flexeril at that time...  ~  11/10: she has several trigger points but doesn't believe that she has FM...  ANXIETY (ICD-300.00) - some stress caring for  husb...  Hx of ERYSIPELAS (ICD-035) - left facial erysipelas in 2004 responded to Keflex...  Health Maintenance -  GYN= DrNeal on Vivelle dot... [facility] which was WNL, [facility]... Vit D level here 11/09 was 50... she takes MVI + Vit D 1000 u daily...  ~  Immunizations:  She gets the yearly seasonal Flu vaccine... TDAP given 3/13...    Past Surgical History  Procedure Laterality Date  . Abdominal hysterectomy    . Oophorectomy    . Breast biopsie      benign lt. breast  . Colonoscopy    . Colonoscopy    . Upper gastrointestinal endoscopy    . Esophagogastroduodenoscopy    . Cataract extraction bilateral w/ anterior vitrectomy    . Rotator cuff repair  08/2001    Dr. GioGladstone Lighter Breast biopsiy      2011-benign  . Knee arthroscopy  2012  . Tee without cardioversion N/A 04/29/2013    Procedure: TRANSESOPHAGEAL ECHOCARDIOGRAM (TEE);  Surgeon: JayJettie BoozeD;  [facility];  Service: Cardiovascular;  Laterality: N/A;  . Ablation  04-29-2013    PVI and CTI by Dr AllRayann Heman  Outpatient Encounter Prescriptions as of 10/21/2013  Medication Sig  . apixaban (ELIQUIS) 5 MG TABS tablet Take 1 tablet (5 mg total) by mouth 2 (two) times daily.  . Cholecalciferol (VITAMIN D) 1000 UNITS capsule Take 1,000 Units by mouth daily.    . dMarland Kitchencusate sodium (COLACE) 100 MG capsule Take 100 mg by mouth 2 (two) times daily.   . eMarland Kitchentradiol (VIVELLE-DOT) 0.05 MG/24HR Place 1 patch onto the skin 2 (two) times a week.    . fish oil-omega-3 fatty acids 1000 MG capsule  Take 1 g by mouth daily.   [name] Kitchen levothyroxine (SYNTHROID, LEVOTHROID) 88 MCG tablet Take 1 tablet (88 mcg total) by mouth daily.  . Multiple Vitamins-Minerals (MULTIVITAMIN WITH MINERALS) tablet Take 1 tablet by mouth daily.    . psyllium (METAMUCIL) 58.6 % powder Take 1 packet by mouth daily.    . verapamil (CALAN-SR) 120 MG CR tablet Take  120 mg by mouth daily as needed. For heart palpitations  . vitamin B-12 (CYANOCOBALAMIN) 1000 MCG tablet Take 1,000 mcg by mouth daily. Takes liquid form   . vitamin C (ASCORBIC ACID) 500 MG tablet Take 500 mg by mouth daily.    Allergies  Allergen Reactions  . Codeine     REACTION: nausea/dizziness  . Codeine Phosphate     REACTION: nausea,dizzy  . Propafenone     BP too low  . Tramadol Hcl     REACTION: nausea--dizzy    Current Medications, Allergies, Past Medical History, Past Surgical History, Family History, and Social History were reviewed in [employer] record.    Review of Systems    The patient denies fever, chills, sweats, anorexia, fatigue, weakness, malaise, weight loss, sleep disorder, blurring, diplopia, eye irritation, eye discharge, vision loss, eye pain, photophobia, earache, ear discharge, tinnitus, decreased hearing, nasal congestion, nosebleeds, sore throat, hoarseness, chest pain, palpitations, syncope, dyspnea on exertion, orthopnea, PND, peripheral edema, cough, dyspnea at rest, excessive sputum, hemoptysis, wheezing, pleurisy, nausea, vomiting, diarrhea, constipation, change in bowel habits, abdominal pain, melena, hematochezia, jaundice, gas/bloating, indigestion/heartburn, dysphagia, odynophagia, dysuria, hematuria, urinary frequency, urinary hesitancy, nocturia, incontinence, back pain, joint pain, joint swelling, muscle cramps, muscle weakness, stiffness, arthritis, sciatica, restless legs, leg pain at night, leg pain with exertion, rash, itching, dryness, suspicious lesions, paralysis, paresthesias, seizures, tremors, vertigo, transient blindness, frequent falls, frequent headaches, difficulty walking, depression, anxiety, memory loss, confusion, cold intolerance, heat intolerance, polydipsia, polyphagia, polyuria, unusual weight change, abnormal bruising, bleeding, enlarged lymph nodes, urticaria, allergic rash, hay fever, and recurrent  infections.     Objective:   Physical Exam    WD, WN, 79 y/o WF in NAD... GENERAL:  Alert & oriented; pleasant & cooperative... HEENT:  [place]/AT, EOM-wnl, PERRLA, EACs-clear, TMs-wnl, NOSE-clear, THROAT-clear & wnl. NECK:  Supple w/ fairROM; no JVD; normal carotid impulses w/o bruits; no thyromegaly or nodules palpated; no lymphadenopathy. CHEST:  Clear to P & A; without wheezes/ rales/ or rhonchi. HEART:  Regular Rhythm; without murmurs/ rubs/ or gallops. ABDOMEN:  Soft & nontender; normal bowel sounds; no organomegaly or masses detected. EXT: without deformities, mild arthritic changes; no varicose veins/ venous insuffic/ or edema. NEURO:  no focal neuro deficits... DERM:  fine macular/papular rash along upper back, neck and under later axilla, no sign redness.   RADIOLOGY DATA:  Reviewed in the EPIC EMR & discussed w/ the patient...  LABORATORY DATA:  Reviewed in the EPIC EMR & discussed w/ the patient...   Assessment:      Hx CWP in the past, ?MVP, Hx SVT>  She denies recurrent CP & notes that palpit are diminished off aleve rx...  CHOL>  FLP w/ LDL <100 now & she is improved...  HYPOTHYROID>  TSH is stable on Synthroid 92mg/d (doesn't want generic); she notes that she wants to keep the TSH low because she feels better at these levels...  GI> HH, Divertics, Polyps>  She notes that insurance won't cover colonoscopy x every 128yr..  DJD>  She had right knee arthroscopy by DrGioffre... States castor oil helps!  Anxiety> stress  caring for Herg; state 3 friends w/ spots on their lungs (reassured w/ clear CXR today)...     Plan:

## 2013-10-21 NOTE — Patient Instructions (Signed)
Today we updated your med list in our EPIC system...    Continue your current medications the same...    We refilled your med per request...  We reviewed your recent FASTING blood work & gave you a copy for your records...  Call for any questions.[name] KitchenMarland Kitchen

## 2013-11-06 ENCOUNTER — Ambulatory Visit: Payer: Medicare Other | Admitting: Internal Medicine

## 2013-11-17 ENCOUNTER — Encounter: Payer: Self-pay | Admitting: Pulmonary Disease

## 2013-11-17 DIAGNOSIS — E7800 Pure hypercholesterolemia, unspecified: Secondary | ICD-10-CM

## 2013-11-17 DIAGNOSIS — E039 Hypothyroidism, unspecified: Secondary | ICD-10-CM

## 2013-11-17 DIAGNOSIS — F411 Generalized anxiety disorder: Secondary | ICD-10-CM

## 2013-11-19 ENCOUNTER — Encounter: Payer: Self-pay | Admitting: Pulmonary Disease

## 2013-11-19 NOTE — Addendum Note (Signed)
Addended by: Horatio Pel on: 11/19/2013 09:38 AM   Modules accepted: Orders

## 2013-11-26 ENCOUNTER — Telehealth: Payer: Self-pay | Admitting: *Deleted

## 2013-11-26 NOTE — Telephone Encounter (Signed)
Pt has been set up with new primary care provider at [place] office   Dr. Maudie Mercury on 03-31-2014

## 2013-11-28 ENCOUNTER — Ambulatory Visit (INDEPENDENT_AMBULATORY_CARE_PROVIDER_SITE_OTHER): Payer: Medicare Other | Admitting: Internal Medicine

## 2013-11-28 ENCOUNTER — Encounter: Payer: Self-pay | Admitting: Internal Medicine

## 2013-11-28 VITALS — BP 132/75 | HR 69 | Ht 62.75 in | Wt 143.0 lb

## 2013-11-28 DIAGNOSIS — I4891 Unspecified atrial fibrillation: Secondary | ICD-10-CM

## 2013-11-28 NOTE — Patient Instructions (Signed)
Your physician wants you to follow-up in: 1 year with Dr Allred.  You will receive a reminder letter in the mail two months in advance. If you don't receive a letter, please call our office to schedule the follow-up appointment.  

## 2013-11-28 NOTE — Progress Notes (Signed)
PCP:  Colin Benton, MD  The patient presents today for routine electrophysiology followup.  Since last being seen in our clinic, the patient reports doing very well. She states that she has predominately maintained SR with only rare palpitations.  She has not had to take any prn Verapamil.   Today, she denies symptoms of chest pain, shortness of breath, orthopnea, PND, lower extremity edema, dizziness, presyncope, syncope, or neurologic sequela.  The patient feels that she is tolerating medications without difficulties and is otherwise without complaint today.   Past Medical History  Diagnosis Date  . Arthritis   . Blood transfusion      and  . GERD (gastroesophageal reflux disease)   . Hyperlipidemia   . Thyroid disease   . Cataract   . Atrial fibrillation and flutter     s/p PVI and CTI by Dr Rayann Heman 04/2013  . Supraventricular tachycardia     AV nodal reentry  s/p RFCA 2001  . Hiatal hernia   . Diverticulosis of colon   . Hx of colonic polyps   . Acquired renal cyst of right kidney   . Anxiety   . Erysipelas   . Fibromyalgia    Past Surgical History  Procedure Laterality Date  . Abdominal hysterectomy    . Oophorectomy    . Breast biopsie      benign lt. breast  . Colonoscopy    . Colonoscopy    . Upper gastrointestinal endoscopy    . Esophagogastroduodenoscopy    . Cataract extraction bilateral w/ anterior vitrectomy    . Rotator cuff repair  08/2001    Dr. Gladstone Lighter  . Breast biopsiy      2011-benign  . Knee arthroscopy  2012  . Tee without cardioversion N/A 04/29/2013    Procedure: TRANSESOPHAGEAL ECHOCARDIOGRAM (TEE);  Surgeon: Jettie Booze, MD;  Location: [facility];  Service: Cardiovascular;  Laterality: N/A;  . Ablation  04-29-2013    PVI and CTI by Dr Rayann Heman     Current Outpatient Prescriptions  Medication Sig Dispense Refill  . apixaban (ELIQUIS) 5 MG TABS tablet Take 1 tablet (5 mg total) by mouth 2 (two) times daily.  60 tablet  6  .  Cholecalciferol (VITAMIN D) 1000 UNITS capsule Take 1,000 Units by mouth daily.        [name] Kitchen docusate sodium (COLACE) 100 MG capsule Take 100 mg by mouth every evening.       [name] Kitchen estradiol (VIVELLE-DOT) 0.05 MG/24HR Place 1 patch onto the skin 2 (two) times a week.        . fish oil-omega-3 fatty acids 1000 MG capsule Take 1 g by mouth daily.       [name] Kitchen levothyroxine (SYNTHROID, LEVOTHROID) 88 MCG tablet Take 1 tablet (88 mcg total) by mouth daily.  90 tablet  3  . Multiple Vitamins-Minerals (MULTIVITAMIN WITH MINERALS) tablet Take 1 tablet by mouth daily.        . psyllium (METAMUCIL) 58.6 % powder Take 1 packet by mouth daily.        . verapamil (CALAN-SR) 120 MG CR tablet Take 120 mg by mouth daily as needed. For heart palpitations      . vitamin B-12 (CYANOCOBALAMIN) 1000 MCG tablet Take 1,000 mcg by mouth daily. Takes liquid form       . vitamin C (ASCORBIC ACID) 500 MG tablet Take 500 mg by mouth daily.       No current facility-administered medications for this visit.    Allergies  Allergen Reactions  . Codeine     REACTION: nausea/dizziness  . Codeine Phosphate     REACTION: nausea,dizzy  . Propafenone     BP too low  . Tramadol Hcl     REACTION: nausea--dizzy    History   Social History  . Marital Status: Married    Spouse Name: N/A    Number of Children: 1  . Years of Education: N/A   Occupational History  .     Social History Main Topics  . Smoking status: Former Smoker -- 0.50 packs/day for 3 years    Types: Cigarettes    Quit date: 09/19/1966  . Smokeless tobacco: Never Used  . Alcohol Use: 1.8 oz/week    3 Glasses of wine per week     Comment: wine weekly  . Drug Use: No  . Sexual Activity: Not on file   Other Topics Concern  . Not on file   Social History Narrative  . No narrative on file   Physical Exam: Filed Vitals:   11/28/13 1221  BP: 132/75  Pulse: 69  Height: 5' 2.75" (1.594 m)  Weight: 143 lb (64.864 kg)    GEN- The patient is well  appearing, alert and oriented x 3 today.   Head- normocephalic, atraumatic Eyes-  Sclera clear, conjunctiva pink Ears- hearing intact Oropharynx- clear Neck- supple,  Lungs- Clear to ausculation bilaterally, normal work of breathing Heart- Regular rate and rhythm, no murmurs, rubs or gallops, PMI not laterally displaced GI- soft, NT, ND, + BS Extremities- no clubbing, cyanosis, or edema Neuro- strength and sensation are intact  EKG today reveals sinus rhythm 69 bpm, PR 174, QTc 437, otherwise normal ekg  Assessment and Plan:  1. afib  Doing well s/p ablation  Maintaining sinus rhythm off of AAD therapy  Continue eliquis  Return in 12 months

## 2014-03-31 ENCOUNTER — Encounter: Payer: Self-pay | Admitting: Family Medicine

## 2014-03-31 ENCOUNTER — Ambulatory Visit (INDEPENDENT_AMBULATORY_CARE_PROVIDER_SITE_OTHER): Payer: Medicare Other | Admitting: Family Medicine

## 2014-03-31 VITALS — BP 102/74 | HR 84 | Temp 98.2°F | Ht 62.5 in | Wt 143.5 lb

## 2014-03-31 DIAGNOSIS — R232 Flushing: Secondary | ICD-10-CM

## 2014-03-31 DIAGNOSIS — Z7189 Other specified counseling: Secondary | ICD-10-CM

## 2014-03-31 DIAGNOSIS — I4891 Unspecified atrial fibrillation: Secondary | ICD-10-CM | POA: Insufficient documentation

## 2014-03-31 DIAGNOSIS — I480 Paroxysmal atrial fibrillation: Secondary | ICD-10-CM | POA: Insufficient documentation

## 2014-03-31 DIAGNOSIS — Z7689 Persons encountering health services in other specified circumstances: Secondary | ICD-10-CM

## 2014-03-31 DIAGNOSIS — N951 Menopausal and female climacteric states: Secondary | ICD-10-CM

## 2014-03-31 DIAGNOSIS — I498 Other specified cardiac arrhythmias: Secondary | ICD-10-CM

## 2014-03-31 DIAGNOSIS — E039 Hypothyroidism, unspecified: Secondary | ICD-10-CM

## 2014-03-31 DIAGNOSIS — IMO0001 Reserved for inherently not codable concepts without codable children: Secondary | ICD-10-CM

## 2014-03-31 DIAGNOSIS — I499 Cardiac arrhythmia, unspecified: Secondary | ICD-10-CM

## 2014-03-31 HISTORY — DX: Flushing: R23.2

## 2014-03-31 LAB — TSH: TSH: 1.02 u[IU]/mL (ref 0.35–4.50)

## 2014-03-31 NOTE — Patient Instructions (Addendum)
-  We have ordered labs or studies at this visit. It can take up to 1-2 weeks for results and processing. We will contact you with instructions IF your results are abnormal. Normal results will be released to your [facility]. If you have not heard from [place] or can not find your results in [facility] in 2 weeks please contact our office.  -PLEASE SIGN UP FOR MYCHART TODAY   We recommend the following healthy lifestyle measures: - eat a healthy diet consisting of lots of vegetables, fruits, beans, nuts, seeds, healthy meats such as white chicken and fish and whole grains.  - avoid fried foods, fast food, processed foods, sodas, red meet and other fattening foods.  - get a least 150 minutes of aerobic exercise per week.   Follow up in: 4-6 months for [place]

## 2014-03-31 NOTE — Progress Notes (Signed)
No chief complaint on file.   HPI:  Marissa Hart is here to establish care.  Last PCP and physical:  Had physical with labs in Nov, 2014  Has the following chronic problems and concerns today:  Patient Active Problem List   Diagnosis Date Noted  . Atrial arrhythmia - A. Flutter, PAF - s/p ablation, on elequis, managed by cardiologist (Dr. Rayann Heman) 03/31/2014  . Hot flashes - on low dose HRT with gynecologist, Dr. Nori Riis 03/31/2014  . HYPOTHYROIDISM 07/28/2007  . ANXIETY 07/28/2007  . FIBROMYALGIA 07/28/2007   Hypothyroidism: -on levothyroxine 67mcg daily for a long time, changed to generic in 09/2013 Lab Results  Component Value Date   TSH 0.45 10/14/2013  -denies: palpitation, constipation, heat/cold intoeranc, skin changes  Hot Flashes: -on vivelle dot -sees gynecologist for this, on very low dose of transdermal estrogen, s/p hysterectomy  Dyslipidemia: -low hdl  PAF, atrial flutter, PACs: -followed by cardiology/electrophysiology  -s/p ablation in 2014 -on eliquis, takes verapamil as needed has needed more frequently over the last few months -denies: CP, SOB, DOE  Chronic Neck Pain: -sup occ, L > R, sometime bilat shoulders -denies: CP, weakness or numbness in extremities, pain into arms  ROS negative for unless reported above: fevers, unintentional weight loss, hearing or vision loss, chest pain, palpitations, struggling to breath, hemoptysis, melena, hematochezia, hematuria, falls, loc, si, thoughts of self harm  Past Medical History  Diagnosis Date  . Arthritis   . Blood transfusion      and  . GERD (gastroesophageal reflux disease)   . Hyperlipidemia   . Thyroid disease   . Cataract   . Atrial fibrillation and flutter     s/p PVI and CTI by Dr Rayann Heman 04/2013  . Supraventricular tachycardia     AV nodal reentry  s/p RFCA 2001  . Hiatal hernia   . Diverticulosis of colon   . Hx of colonic polyps   . Acquired renal cyst of right kidney   . Anxiety   .  Erysipelas   . Fibromyalgia   . Atrial arrhythmia - A. Flutter, PAF - s/p ablation, on elequis, managed by cardiologist (Dr. Rayann Heman) 03/31/2014  . Atrial arrhythmia - A. Flutter, PAF - s/p ablation, on elequis, managed by cardiologist (Dr. Rayann Heman) 03/31/2014  . MITRAL VALVE PROLAPSE 07/28/2007    Qualifier: Diagnosis of  By: Julien Girt CMA, [name]    . DJD (degenerative joint disease) 10/18/2011  . CHEST WALL PAIN, HX OF 07/28/2007    Qualifier: Diagnosis of  By: Julien Girt CMA, [name]    . Hot flashes - on low dose HRT with gynecologist, Dr. Nori Riis 03/31/2014    No family history on file.  History   Social History  . Marital Status: Married    Spouse Name: N/A    Number of Children: 1  . Years of Education: N/A   Occupational History  .     Social History Main Topics  . Smoking status: Former Smoker -- 0.50 packs/day for 3 years    Types: Cigarettes    Quit date: 09/19/1966  . Smokeless tobacco: Never Used  . Alcohol Use: 1.8 oz/week    3 Glasses of wine per week     Comment: wine weekly  . Drug Use: No  . Sexual Activity: None   Other Topics Concern  . None   Social History Narrative   Work or School: none      Home Situation: lives with husband      Spiritual Beliefs:  Christian      Lifestyle:no regular exercise; diet so so             Current outpatient prescriptions:apixaban (ELIQUIS) 5 MG TABS tablet, Take 1 tablet (5 mg total) by mouth 2 (two) times daily., Disp: 60 tablet, Rfl: 6;  Cholecalciferol (VITAMIN D) 1000 UNITS capsule, Take 1,000 Units by mouth daily.  , Disp: , Rfl: ;  estradiol (VIVELLE-DOT) 0.05 MG/24HR, Place 1 patch onto the skin 2 (two) times a week.  , Disp: , Rfl: ;  fish oil-omega-3 fatty acids 1000 MG capsule, Take 1 g by mouth daily. , Disp: , Rfl:  levothyroxine (SYNTHROID, LEVOTHROID) 88 MCG tablet, Take 1 tablet (88 mcg total) by mouth daily., Disp: 90 tablet, Rfl: 3;  Multiple Vitamins-Minerals (MULTIVITAMIN WITH MINERALS) tablet, Take 1 tablet by  mouth daily.  , Disp: , Rfl: ;  psyllium (METAMUCIL) 58.6 % powder, Take 1 packet by mouth daily.  , Disp: , Rfl: ;  verapamil (CALAN-SR) 120 MG CR tablet, Take 120 mg by mouth daily as needed. For heart palpitations, Disp: , Rfl:  vitamin B-12 (CYANOCOBALAMIN) 1000 MCG tablet, Take 1,000 mcg by mouth daily. Takes liquid form , Disp: , Rfl: ;  vitamin C (ASCORBIC ACID) 500 MG tablet, Take 500 mg by mouth daily., Disp: , Rfl: ;  docusate sodium (COLACE) 100 MG capsule, Take 100 mg by mouth every evening. , Disp: , Rfl:   EXAM:  Filed Vitals:   03/31/14 1110  BP: 102/74  Pulse: 84  Temp: 98.2 F (36.8 C)    Body mass index is 25.81 kg/(m^2).  GENERAL: vitals reviewed and listed above, alert, oriented, appears well hydrated and in no acute distress  HEENT: atraumatic, conjunttiva clear, no obvious abnormalities on inspection of external nose and ears  NECK: no obvious masses on inspection  LUNGS: clear to auscultation bilaterally, no wheezes, rales or rhonchi, good air movement  CV: HRRR, no peripheral edema  MS: moves all extremities without noticeable abnormality  PSYCH: pleasant and cooperative, no obvious depression or anxiety  ASSESSMENT AND PLAN:  Discussed the following assessment and plan:  Atrial arrhythmia - A. Flutter, PAF - s/p ablation, on elequis, managed by cardiologist (Dr. Rayann Heman) -using her verapamil a little more - advised she notify and see her cardiologist -check TSH  Hot flashes - on low dose HRT with gynecologist, Dr. Nori Riis  HYPOTHYROIDISM - Plan: TSH -check TSH, recent switch to levothyroxine  FIBROMYALGIA -neck exercises, follow up as needed  Encounter to establish care with new doctor  -We reviewed the PMH, PSH, FH, SH, Meds and Allergies. -We provided refills for any medications we will prescribe as needed. -We addressed current concerns per orders and patient instructions. -We have asked for records for pertinent exams, studies, vaccines and  notes from previous providers. -We have advised patient to follow up per instructions below.   -Patient advised to return or notify a doctor immediately if symptoms worsen or persist or new concerns arise.  Patient Instructions  -We have ordered labs or studies at this visit. It can take up to 1-2 weeks for results and processing. We will contact you with instructions IF your results are abnormal. Normal results will be released to your [facility]. If you have not heard from [place] or can not find your results in [facility] in 2 weeks please contact our office.  -PLEASE SIGN UP FOR MYCHART TODAY   We recommend the following healthy lifestyle measures: - eat a healthy diet consisting  of lots of vegetables, fruits, beans, nuts, seeds, healthy meats such as white chicken and fish and whole grains.  - avoid fried foods, fast food, processed foods, sodas, red meet and other fattening foods.  - get a least 150 minutes of aerobic exercise per week.   Follow up in: 4-6 months for [place], [place]

## 2014-03-31 NOTE — Progress Notes (Signed)
Pre visit review using our clinic review tool, if applicable. No additional management support is needed unless otherwise documented below in the visit note. 

## 2014-04-24 ENCOUNTER — Telehealth: Payer: Self-pay | Admitting: Internal Medicine

## 2014-04-24 NOTE — Telephone Encounter (Signed)
New message    Call wife to inquire about a incorrect message taken on her husband not  Her.    Patient stated she having that problem again seen Dr. Rayann Heman at church he advise her to call the office.  patient on  verapamil .  Patient has appt schedule for 9/30 @ 2 pm is aware that ep scheduler Melissa will be contacting her for sooner appt.

## 2014-06-07 ENCOUNTER — Telehealth: Payer: Self-pay | Admitting: Internal Medicine

## 2014-06-07 NOTE — Telephone Encounter (Signed)
Spoke with the patient this AM.  Her husband has been diagnosed with a liver mass and she is afraid that she cannot meet with me as scheduled 9/30.  I have therefore rescheduled for 06/09/14 at 9am.  She is having no afib per her report.  She has rare palpitations which are short lived and respond well to verapamil.  She is pleased with the results of her procedure.

## 2014-06-09 ENCOUNTER — Ambulatory Visit (INDEPENDENT_AMBULATORY_CARE_PROVIDER_SITE_OTHER): Payer: Medicare Other | Admitting: Internal Medicine

## 2014-06-09 ENCOUNTER — Encounter: Payer: Self-pay | Admitting: Internal Medicine

## 2014-06-09 VITALS — BP 120/78 | HR 82 | Ht 63.0 in | Wt 139.0 lb

## 2014-06-09 DIAGNOSIS — I4819 Other persistent atrial fibrillation: Secondary | ICD-10-CM

## 2014-06-09 DIAGNOSIS — I4891 Unspecified atrial fibrillation: Secondary | ICD-10-CM

## 2014-06-09 MED ORDER — VERAPAMIL HCL ER 120 MG PO TBCR: 120.0000 mg | EXTENDED_RELEASE_TABLET | Freq: Every day | ORAL | Status: DC | PRN

## 2014-06-09 MED ORDER — EDOXABAN TOSYLATE 60 MG PO TABS: 60.0000 mg | ORAL_TABLET | Freq: Every day | ORAL | Status: DC

## 2014-06-09 NOTE — Patient Instructions (Addendum)
Your physician wants you to follow-up in: 6 months with Roderic Palau, NP in [place] clinic and 12 months with Dr Vallery Ridge will receive a reminder letter in the mail two months in advance. If you don't receive a letter, please call our office to schedule the follow-up appointment.  Your physician has recommended you make the following change in your medication:  1) Stop Eliquis 2) Start Savaysa 60mg  daily

## 2014-06-09 NOTE — Progress Notes (Signed)
PCP:  Colin Benton, MD  The patient presents today for routine electrophysiology followup.  Since last being seen in our clinic, the patient reports doing very well. She states that although she has a lot of stress in her life right now with her husband being diagnosed with a liver mass, she has had few palpitations and is doing well. Continues on apixaban without bleeding issues but is very expensive for her. Discussed changing over to [facility] for cost issues. Her Crcl is 60.9.  She does not feel that she is having any atrial fibrillation.  Today, she denies symptoms of chest pain, shortness of breath, orthopnea, PND, lower extremity edema, dizziness, presyncope, syncope, or neurologic sequela.  The patient feels that she is tolerating medications without difficulties and is otherwise without complaint today.   Past Medical History  Diagnosis Date  . Arthritis   . Blood transfusion      and  . GERD (gastroesophageal reflux disease)   . Hyperlipidemia   . Thyroid disease   . Cataract   . Atrial fibrillation and flutter     s/p PVI and CTI by Dr Rayann Heman 04/2013  . Supraventricular tachycardia     AV nodal reentry  s/p RFCA 2001  . Hiatal hernia   . Diverticulosis of colon   . Hx of colonic polyps   . Acquired renal cyst of right kidney   . Anxiety   . Erysipelas   . Fibromyalgia   . Atrial arrhythmia - A. Flutter, PAF - s/p ablation, on elequis, managed by cardiologist (Dr. Rayann Heman) 03/31/2014  . Atrial arrhythmia - A. Flutter, PAF - s/p ablation, on elequis, managed by cardiologist (Dr. Rayann Heman) 03/31/2014  . MITRAL VALVE PROLAPSE 07/28/2007    Qualifier: Diagnosis of  By: Julien Girt CMA, [name]    . DJD (degenerative joint disease) 10/18/2011  . CHEST WALL PAIN, HX OF 07/28/2007    Qualifier: Diagnosis of  By: Julien Girt CMA, [name]    . Hot flashes - on low dose HRT with gynecologist, Dr. Nori Riis 03/31/2014   Past Surgical History  Procedure Laterality Date  . Abdominal hysterectomy    .  Oophorectomy    . Breast biopsie      benign lt. breast  . Colonoscopy    . Colonoscopy    . Upper gastrointestinal endoscopy    . Esophagogastroduodenoscopy    . Cataract extraction bilateral w/ anterior vitrectomy    . Rotator cuff repair  08/2001    Dr. Gladstone Lighter  . Breast biopsiy      2011-benign  . Knee arthroscopy  2012  . Tee without cardioversion N/A 04/29/2013    Procedure: TRANSESOPHAGEAL ECHOCARDIOGRAM (TEE);  Surgeon: Jettie Booze, MD;  Location: [facility];  Service: Cardiovascular;  Laterality: N/A;  . Ablation  04-29-2013    PVI and CTI by Dr Rayann Heman     Current Outpatient Prescriptions  Medication Sig Dispense Refill  . B Complex Vitamins (VITAMIN B COMPLEX PO) Take 1,000 mLs by mouth daily.      . Cholecalciferol (VITAMIN D) 1000 UNITS capsule Take 1,000 Units by mouth daily.        [name] Kitchen docusate sodium (COLACE) 100 MG capsule Take 100 mg by mouth every evening.       [name] Kitchen estradiol (VIVELLE-DOT) 0.05 MG/24HR Place 1 patch onto the skin 2 (two) times a week.        . fish oil-omega-3 fatty acids 1000 MG capsule Take 1 g by mouth daily.       [name] Kitchen  levothyroxine (SYNTHROID, LEVOTHROID) 88 MCG tablet Take 1 tablet (88 mcg total) by mouth daily.  90 tablet  3  . Multiple Vitamins-Minerals (MULTIVITAMIN WITH MINERALS) tablet Take 1 tablet by mouth daily.        . psyllium (METAMUCIL) 58.6 % powder Take 1 packet by mouth daily.        . verapamil (CALAN-SR) 120 MG CR tablet Take 1 tablet (120 mg total) by mouth daily as needed. For heart palpitations  30 tablet  6  . vitamin C (ASCORBIC ACID) 500 MG tablet Take 500 mg by mouth daily.      [name] Kitchen edoxaban (SAVAYSA) 60 MG TABS tablet Take 60 mg by mouth daily.  30 tablet  11   No current facility-administered medications for this visit.    Allergies  Allergen Reactions  . Propafenone Other (See Comments)    BP too low  . Codeine Other (See Comments)    REACTION: nausea/dizziness  . Codeine Phosphate Other (See Comments)     REACTION: nausea,dizzy  . Tramadol Hcl Other (See Comments)    REACTION: nausea--dizzy    History   Social History  . Marital Status: Married    Spouse Name: N/A    Number of Children: 1  . Years of Education: N/A   Occupational History  .     Social History Main Topics  . Smoking status: Former Smoker -- 0.50 packs/day for 3 years    Types: Cigarettes    Quit date: 09/19/1966  . Smokeless tobacco: Never Used  . Alcohol Use: 1.8 oz/week    3 Glasses of wine per week     Comment: wine weekly  . Drug Use: No  . Sexual Activity: Not on file   Other Topics Concern  . Not on file   Social History Narrative   Work or School: none      Home Situation: lives with husband      Spiritual Beliefs: Christian      Lifestyle:no regular exercise; diet so so            Physical Exam: Filed Vitals:   06/09/14 0921  BP: 120/78  Pulse: 82  Height: 5\' 3"  (1.6 m)  Weight: 139 lb (63.05 kg)    GEN- The patient is well appearing, alert and oriented x 3 today.   Head- normocephalic, atraumatic Eyes-  Sclera clear, conjunctiva pink Ears- hearing intact Oropharynx- clear Neck- supple,  Lungs- Clear to ausculation bilaterally, normal work of breathing Heart- Regular rate and rhythm, no murmurs, rubs or gallops, PMI not laterally displaced GI- soft, NT, ND, + BS Extremities- no clubbing, cyanosis, or edema Neuro- strength and sensation are intact  EKG today reveals sinus rhythm 82 bpm, PR 182, QTc 472, otherwise normal ekg  Assessment and Plan:  1. afib  Doing well s/p ablation  Maintaining sinus rhythm off of AAD therapy  Change Noac to Savaysa 60 mg daily from eliquis due to cost. Recheck in 6 months in afib clinic.

## 2014-06-13 ENCOUNTER — Ambulatory Visit (INDEPENDENT_AMBULATORY_CARE_PROVIDER_SITE_OTHER): Payer: Medicare Other

## 2014-06-13 DIAGNOSIS — Z23 Encounter for immunization: Secondary | ICD-10-CM

## 2014-06-18 ENCOUNTER — Ambulatory Visit: Payer: Medicare Other | Admitting: Internal Medicine

## 2014-07-29 ENCOUNTER — Ambulatory Visit (INDEPENDENT_AMBULATORY_CARE_PROVIDER_SITE_OTHER): Payer: Medicare Other | Admitting: Family Medicine

## 2014-07-29 ENCOUNTER — Encounter: Payer: Self-pay | Admitting: Family Medicine

## 2014-07-29 VITALS — BP 112/78 | HR 74 | Temp 98.0°F | Ht 62.25 in | Wt 135.6 lb

## 2014-07-29 DIAGNOSIS — E039 Hypothyroidism, unspecified: Secondary | ICD-10-CM

## 2014-07-29 DIAGNOSIS — N951 Menopausal and female climacteric states: Secondary | ICD-10-CM

## 2014-07-29 DIAGNOSIS — F4323 Adjustment disorder with mixed anxiety and depressed mood: Secondary | ICD-10-CM

## 2014-07-29 DIAGNOSIS — Z0000 Encounter for general adult medical examination without abnormal findings: Secondary | ICD-10-CM

## 2014-07-29 DIAGNOSIS — I480 Paroxysmal atrial fibrillation: Secondary | ICD-10-CM

## 2014-07-29 DIAGNOSIS — Z23 Encounter for immunization: Secondary | ICD-10-CM

## 2014-07-29 DIAGNOSIS — R232 Flushing: Secondary | ICD-10-CM

## 2014-07-29 LAB — BASIC METABOLIC PANEL
BUN: 12 mg/dL (ref 6–23)
CALCIUM: 8.8 mg/dL (ref 8.4–10.5)
CHLORIDE: 102 meq/L (ref 96–112)
CO2: 27 meq/L (ref 19–32)
Creatinine, Ser: 0.8 mg/dL (ref 0.4–1.2)
GFR: 78.3 mL/min (ref >60.00)
GLUCOSE: 91 mg/dL (ref 70–99)
POTASSIUM: 4.2 meq/L (ref 3.5–5.1)
SODIUM: 139 meq/L (ref 135–145)

## 2014-07-29 LAB — TSH: TSH: 0.43 u[IU]/mL (ref 0.35–4.50)

## 2014-07-29 NOTE — Addendum Note (Signed)
Addended by: Agnes Lawrence on: 07/29/2014 10:17 AM   Modules accepted: Orders

## 2014-07-29 NOTE — Patient Instructions (Addendum)
BEFORE YOU LEAVE: -prevnar 13  -schedule mammogram  We recommend the following healthy lifestyle measures: - eat a healthy diet consisting of lots of vegetables, fruits, beans, nuts, seeds, healthy meats such as white chicken and fish and whole grains.  - avoid fried foods, fast food, processed foods, sodas, red meet and other fattening foods.  - get a least 150 minutes of aerobic exercise per week.

## 2014-07-29 NOTE — Progress Notes (Signed)
Pre visit review using our clinic review tool, if applicable. No additional management support is needed unless otherwise documented below in the visit note. 

## 2014-07-29 NOTE — Progress Notes (Signed)
Medicare Annual Preventive Care Visit  (initial annual wellness or annual wellness exam)  Concerns and/or follow up today:  Hypothyroidism: -on levothyroxine 87mcg daily for a long time, changed to generic in 09/2013 -denies: palpitation, constipation, heat/cold intoeranc, skin changes  Hot Flashes: -on vivelle dot -sees gynecologist for this, on very low dose of transdermal estrogen, s/p hysterectomy  Dyslipidemia: -low hdl  PAF, atrial flutter, PACs: -followed by cardiology/electrophysiology  -s/p ablation in 2014 -on edoxaban, takes verapamil as needed has needed more frequently over the last few months -denies: CP, SOB, DOE  Depressed Mood: -husband with terminal cancer, also dealing with estate of another family member -has been very busy -reports she is reading devotionals and this is really helpful -no thoughts of self harm, SI -she actually feels she is dealing with this well given her situation  ROS: negative for report of fevers, unintentional weight loss, vision changes, vision loss, hearing loss or change, chest pain, sob, hemoptysis, melena, hematochezia, hematuria, genital discharge or lesions, falls, bleeding or bruising, loc, thoughts of suicide or self harm, memory loss  1.) Patient-completed health risk assessment  - completed and reviewed, see scanned documentation  2.) Review of Medical History: -PMH, PSH, Family History and current specialty and care providers reviewed and updated and listed below  - see scanned in document in chart and below  Past Medical History  Diagnosis Date  . Blood transfusion      and  . GERD (gastroesophageal reflux disease)   . Hyperlipidemia   . Thyroid disease   . Cataract   . Atrial fibrillation and flutter     s/p PVI and CTI by Dr Rayann Heman 04/2013  . Supraventricular tachycardia     AV nodal reentry  s/p RFCA 2001  . Hiatal hernia   . Diverticulosis of colon   . Hx of colonic polyps   . Acquired renal cyst of right  kidney   . Anxiety   . Erysipelas   . Fibromyalgia   . MITRAL VALVE PROLAPSE 07/28/2007    Qualifier: Diagnosis of  By: Julien Girt CMA, [name]    . DJD (degenerative joint disease) 10/18/2011  . CHEST WALL PAIN, HX OF 07/28/2007    Qualifier: Diagnosis of  By: Julien Girt CMA, [name]    . Hot flashes - on low dose HRT with gynecologist, Dr. Nori Riis 03/31/2014    Past Surgical History  Procedure Laterality Date  . Abdominal hysterectomy    . Oophorectomy    . Breast biopsie      benign lt. breast  . Colonoscopy    . Colonoscopy    . Upper gastrointestinal endoscopy    . Esophagogastroduodenoscopy    . Cataract extraction bilateral w/ anterior vitrectomy    . Rotator cuff repair  08/2001    Dr. Gladstone Lighter  . Breast biopsiy      2011-benign  . Knee arthroscopy  2012  . Tee without cardioversion N/A 04/29/2013    Procedure: TRANSESOPHAGEAL ECHOCARDIOGRAM (TEE);  Surgeon: Jettie Booze, MD;  [facility]: [facility] ENDOSCOPY;  Service: Cardiovascular;  Laterality: N/A;  . Ablation  04-29-2013    PVI and CTI by Dr Rayann Heman     History   Social History  . Marital Status: Married    Spouse Name: N/A    Number of Children: 1  . Years of Education: N/A   Occupational History  .     Social History Main Topics  . Smoking status: Former Smoker -- 0.50 packs/day for 3 years  Types: Cigarettes    Quit date: 09/19/1966  . Smokeless tobacco: Never Used  . Alcohol Use: 1.8 oz/week    3 Glasses of wine per week     Comment: wine weekly  . Drug Use: No  . Sexual Activity: Not on file   Other Topics Concern  . Not on file   Social History Narrative   Work or School: none      Home Situation: lives with husband      Spiritual Beliefs: Christian      Lifestyle:no regular exercise; diet so so             The patient has a family history of  3.) Review of functional ability and level of safety:  Any difficulty hearing? NO  History of falling? NO  Any trouble with IADLs - using a phone,  using transportation, grocery shopping, preparing meals, doing housework, doing laundry, taking medications and managing money? NO  Advance Directives? YES   See summary of recommendations in Patient Instructions below.  4.) Physical Exam Filed Vitals:   07/29/14 0918  BP: 112/78  Pulse: 74  Temp: 98 F (36.7 C)   Estimated body mass index is 24.61 kg/(m^2) as calculated from the following:   Height as of this encounter: 5' 2.25" (1.581 m).   Weight as of this encounter: 135 lb 9.6 oz (61.508 kg).  EKG (optional): deferred  General: alert, appear well hydrated and in no acute distress  HEENT: visual acuity grossly intact  CV: HRRR  Lungs: CTA bilaterally  Psych: pleasant and cooperative, no obvious depression or anxiety  Mini Cog: 1. Patient instructed to listen carefully and repeat the following: [place]  2. Clock drawing test was administered: NORMAL     3. Recall of three words: 3/3  Scoring: Neg screen See patient instructions for recommendations.  Education and counseling regarding the above review of health provided with a plan for the following: -see scanned patient completed form for further details -fall prevention strategies discussed  -healthy lifestyle discussed -importance and resources for completing advanced directives discussed -see patient instructions below for any other recommendations provided  4)The following written screening schedule of preventive measures were reviewed with assessment and plan made per below, orders and patient instructions:      AAA screening: N/A     Alcohol screening: done     Obesity Screening and counseling: done     STI screening: no applicable, done     Tobacco Screening: done       Pneumococcal (PPSV23 -one dose after 64, one before if risk factors), influenza yearly and hepatitis B vaccines (if high risk - end stage renal disease, IV drugs, homosexual men, live in home for mentally retarded,  hemophilia receiving factors) ASSESSMENT/PLAN: prevnar 13      Screening mammograph (yearly if >40) ASSESSMENT/PLAN: she gets mammogram yearly and is utd      Screening Pap smear/pelvic exam (q2 years) ASSESSMENT/PLAN: n/a      Prostate cancer screening ASSESSMENT/PLAN: n/a      Colorectal cancer screening (FOBT yearly or flex sig q4y or colonoscopy q10y or barium enema q4y) ASSESSMENT/PLAN: done in 2014 with small tubular adenoma and repeat in 5 years advised per review of records      Diabetes outpatient self-management training services ASSESSMENT/PLAN: n/a      Bone mass measurements(covered q2y if indicated - estrogen def, osteoporosis, hyperparathyroid, vertebral abnormalities, osteoporosis or steroids) ASSESSMENT/PLAN: reports has been fine, she  sees Dr. Nori Riis, takes vitamin D      Screening for glaucoma(q1y if high risk - diabetes, FH, AA and > 50 or hispanic and > 65) ASSESSMENT/PLAN: sees Dr. Katy Fitch      Medical nutritional therapy for individuals with diabetes or renal disease ASSESSMENT/PLAN: n/a      Cardiovascular screening blood tests (lipids q5y) ASSESSMENT/PLAN: done this year and ok      Diabetes screening tests ASSESSMENT/PLAN: done   7.) Summary:   Initial Medicare annual wellness visit -see above notes, orders and scanned ducument -risk factors and conditions per above assessment were discussed and treatment, recommendations and referrals were offered per documentation above and orders and patient instructions.  Hypothyroidism, unspecified hypothyroidism type - Plan:  - will check her TSH and adjust medication as needed  Adjustment disorder with mixed anxiety and depressed mood -discussed options for treatment -she will continue with spiritual support for now -she will follow up if any worsening and may consider counseling  Paroxysmal atrial fibrillation - Plan: Basic metabolic panel -sees Dr. Rayann Heman, will check BMP to asses renal function on  current medications and treat accordingly  Hot flashes - on low dose HRT with gynecologist, Dr. Nori Riis -stable  Patient Instructions  BEFORE YOU LEAVE: -prevnar 13  -schedule mammogram  We recommend the following healthy lifestyle measures: - eat a healthy diet consisting of lots of vegetables, fruits, beans, nuts, seeds, healthy meats such as white chicken and fish and whole grains.  - avoid fried foods, fast food, processed foods, sodas, red meet and other fattening foods.  - get a least 150 minutes of aerobic exercise per week.

## 2014-08-28 ENCOUNTER — Encounter (HOSPITAL_COMMUNITY): Payer: Self-pay | Admitting: Internal Medicine

## 2014-10-27 ENCOUNTER — Encounter: Payer: Self-pay | Admitting: Family Medicine

## 2014-10-27 ENCOUNTER — Other Ambulatory Visit: Payer: Self-pay | Admitting: Pulmonary Disease

## 2014-10-27 DIAGNOSIS — E039 Hypothyroidism, unspecified: Secondary | ICD-10-CM

## 2014-10-27 MED ORDER — LEVOTHYROXINE SODIUM 88 MCG PO TABS: 88.0000 ug | ORAL_TABLET | Freq: Every day | ORAL | Status: DC

## 2014-10-27 NOTE — Telephone Encounter (Signed)
Ok. Prescription sent to the pharmacy. I hope you have a great day and I'll see you in March!

## 2014-11-27 ENCOUNTER — Encounter: Payer: Self-pay | Admitting: Family Medicine

## 2014-11-27 ENCOUNTER — Ambulatory Visit (INDEPENDENT_AMBULATORY_CARE_PROVIDER_SITE_OTHER): Payer: Medicare Other | Admitting: Family Medicine

## 2014-11-27 VITALS — BP 116/64 | HR 80 | Temp 98.2°F | Ht 62.25 in | Wt 130.0 lb

## 2014-11-27 DIAGNOSIS — I480 Paroxysmal atrial fibrillation: Secondary | ICD-10-CM

## 2014-11-27 DIAGNOSIS — E039 Hypothyroidism, unspecified: Secondary | ICD-10-CM

## 2014-11-27 DIAGNOSIS — F32A Depression, unspecified: Secondary | ICD-10-CM

## 2014-11-27 DIAGNOSIS — N951 Menopausal and female climacteric states: Secondary | ICD-10-CM | POA: Diagnosis not present

## 2014-11-27 DIAGNOSIS — R232 Flushing: Secondary | ICD-10-CM

## 2014-11-27 DIAGNOSIS — F329 Major depressive disorder, single episode, unspecified: Secondary | ICD-10-CM | POA: Diagnosis not present

## 2014-11-27 DIAGNOSIS — E785 Hyperlipidemia, unspecified: Secondary | ICD-10-CM

## 2014-11-27 LAB — TSH: TSH: 0.15 u[IU]/mL — AB (ref 0.35–4.50)

## 2014-11-27 NOTE — Progress Notes (Addendum)
HPI:  Hypothyroidism: -on levothyroxine 83mcg daily for a long time, changed to generic in 09/2013, then had to reduce to 70mcg -denies: palpitation, constipation, heat/cold intoeranc, skin changes  Hot Flashes: -on vivelle dot -sees gynecologist for this, on very low dose of transdermal estrogen, s/p hysterectomy  Dyslipidemia: -low hdl  PAF, atrial flutter, PACs: -followed by cardiology/electrophysiology  -s/p ablation in 2014 -on edoxaban, takes verapamil as needed has needed more frequently over the last few months -denies: CP, SOB, DOE  Depressed Mood/Bereavment: -husband passed - grieving but doing well -has been very busy and did loose some weight through the process and hopes to not regain -reports she is reading devotionals and this is really helpful -no thoughts of self harm, SI -she actually feels she is dealing with this well given her situation  ROS: negative for report of fevers, unintentional weight loss, vision changes, vision loss, hearing loss or change, chest pain, sob, hemoptysis, melena, hematochezia, hematuria, genital discharge or lesions, falls, bleeding or bruising, loc, thoughts of suicide or self harm, memory loss   ROS: See pertinent positives and negatives per HPI.  Past Medical History  Diagnosis Date  . Blood transfusion      and  . GERD (gastroesophageal reflux disease)   . Hyperlipidemia   . Thyroid disease   . Cataract   . Atrial fibrillation and flutter     s/p PVI and CTI by Dr Rayann Heman 04/2013  . Supraventricular tachycardia     AV nodal reentry  s/p RFCA 2001  . Hiatal hernia   . Diverticulosis of colon   . Hx of colonic polyps   . Acquired renal cyst of right kidney   . Anxiety   . Erysipelas   . Fibromyalgia   . MITRAL VALVE PROLAPSE 07/28/2007    Qualifier: Diagnosis of  By: Julien Girt CMA, [name]    . DJD (degenerative joint disease) 10/18/2011  . CHEST WALL PAIN, HX OF 07/28/2007    Qualifier: Diagnosis of  By: Julien Girt CMA, [name]     . Hot flashes - on low dose HRT with gynecologist, Dr. Nori Riis 03/31/2014    Past Surgical History  Procedure Laterality Date  . Abdominal hysterectomy    . Oophorectomy    . Breast biopsie      benign lt. breast  . Colonoscopy    . Colonoscopy    . Upper gastrointestinal endoscopy    . Esophagogastroduodenoscopy    . Cataract extraction bilateral w/ anterior vitrectomy    . Rotator cuff repair  08/2001    Dr. Gladstone Lighter  . Breast biopsiy      2011-benign  . Knee arthroscopy  2012  . Tee without cardioversion N/A 04/29/2013    Procedure: TRANSESOPHAGEAL ECHOCARDIOGRAM (TEE);  Surgeon: Jettie Booze, MD;  Location: [facility] ENDOSCOPY;  Service: Cardiovascular;  Laterality: N/A;  . Ablation  04-29-2013    PVI and CTI by Dr Rayann Heman   . Atrial fibrillation ablation N/A 04/30/2013    Procedure: ATRIAL FIBRILLATION ABLATION;  Surgeon: [name], MD;  Location: [facility] CATH LAB;  Service: Cardiovascular;  Laterality: N/A;    No family history on file.  History   Social History  . Marital Status: Married    Spouse Name: N/A  . Number of Children: 1  . Years of Education: N/A   Occupational History  .     Social History Main Topics  . Smoking status: Former Smoker -- 0.50 packs/day for 3 years    Types: Cigarettes  Quit date: 09/19/1966  . Smokeless tobacco: Never Used  . Alcohol Use: 1.8 oz/week    3 Glasses of wine per week     Comment: wine weekly  . Drug Use: No  . Sexual Activity: Not on file   Other Topics Concern  . None   Social History Narrative   Work or School: none      Home Situation: lives with husband      Spiritual Beliefs: Christian      Lifestyle:no regular exercise; diet so so              Current outpatient prescriptions:  .  B Complex Vitamins (VITAMIN B COMPLEX PO), Take 1,000 mLs by mouth daily., Disp: , Rfl:  .  Cholecalciferol (VITAMIN D) 1000 UNITS capsule, Take 1,000 Units by mouth daily.  , Disp: , Rfl:  .  docusate sodium (COLACE)  100 MG capsule, Take 100 mg by mouth every evening. , Disp: , Rfl:  .  edoxaban (SAVAYSA) 60 MG TABS tablet, Take 60 mg by mouth daily., Disp: 30 tablet, Rfl: 11 .  estradiol (VIVELLE-DOT) 0.05 MG/24HR, Place 1 patch onto the skin 2 (two) times a week.  , Disp: , Rfl:  .  levothyroxine (SYNTHROID, LEVOTHROID) 88 MCG tablet, Take 1 tablet (88 mcg total) by mouth daily., Disp: 90 tablet, Rfl: 3 .  Multiple Vitamins-Minerals (MULTIVITAMIN WITH MINERALS) tablet, Take 1 tablet by mouth daily.  , Disp: , Rfl:  .  psyllium (METAMUCIL) 58.6 % powder, Take 1 packet by mouth daily.  , Disp: , Rfl:  .  verapamil (CALAN-SR) 120 MG CR tablet, Take 1 tablet (120 mg total) by mouth daily as needed. For heart palpitations, Disp: 30 tablet, Rfl: 6  EXAM:  Filed Vitals:   11/27/14 1003  BP: 116/64  Pulse: 80  Temp: 98.2 F (36.8 C)    Body mass index is 23.59 kg/(m^2).  GENERAL: vitals reviewed and listed above, alert, oriented, appears well hydrated and in no acute distress  HEENT: atraumatic, conjunttiva clear, no obvious abnormalities on inspection of external nose and ears  NECK: no obvious masses on inspection  LUNGS: clear to auscultation bilaterally, no wheezes, rales or rhonchi, good air movement  CV: HRRR, no peripheral edema  MS: moves all extremities without noticeable abnormality  PSYCH: pleasant and cooperative, no obvious depression or anxiety  ASSESSMENT AND PLAN:  Discussed the following assessment and plan:  Hypothyroidism, unspecified hypothyroidism type - Plan: TSH  Paroxysmal atrial fibrillation  Hot flashes - on low dose HRT with gynecologist, Dr. Nori Riis  Depression/Bereavement  Dyslipidemia - Plan: CANCELED: Lipid panel  -doing well despite situation, discussed bereavement counseling -not fasting -tsh today -follow up in 6  -Patient advised to return or notify a doctor immediately if symptoms worsen or persist or new concerns arise.  Patient Instructions   BEFORE YOU LEAVE: -labs -6 month follow up        Marissa Hart R.

## 2014-11-27 NOTE — Progress Notes (Signed)
Pre visit review using our clinic review tool, if applicable. No additional management support is needed unless otherwise documented below in the visit note. 

## 2014-11-27 NOTE — Patient Instructions (Signed)
BEFORE YOU LEAVE: -labs -6 month follow up

## 2014-11-28 ENCOUNTER — Other Ambulatory Visit: Payer: Self-pay | Admitting: *Deleted

## 2014-11-28 DIAGNOSIS — E039 Hypothyroidism, unspecified: Secondary | ICD-10-CM

## 2014-11-28 MED ORDER — LEVOTHYROXINE SODIUM 75 MCG PO TABS: 75.0000 ug | ORAL_TABLET | Freq: Every day | ORAL | Status: DC

## 2014-11-28 NOTE — Addendum Note (Signed)
Addended by: Agnes Lawrence on: 11/28/2014 10:33 AM   Modules accepted: Orders, Medications

## 2015-01-12 ENCOUNTER — Encounter: Payer: Self-pay | Admitting: Family Medicine

## 2015-01-14 ENCOUNTER — Encounter: Payer: Self-pay | Admitting: Family Medicine

## 2015-01-22 ENCOUNTER — Other Ambulatory Visit: Payer: Self-pay | Admitting: Family Medicine

## 2015-01-22 ENCOUNTER — Encounter: Payer: Self-pay | Admitting: Family Medicine

## 2015-01-22 ENCOUNTER — Other Ambulatory Visit (INDEPENDENT_AMBULATORY_CARE_PROVIDER_SITE_OTHER): Payer: Medicare Other

## 2015-01-22 DIAGNOSIS — E039 Hypothyroidism, unspecified: Secondary | ICD-10-CM | POA: Diagnosis not present

## 2015-01-22 LAB — TSH: TSH: 3.42 u[IU]/mL (ref 0.35–4.50)

## 2015-01-22 MED ORDER — LEVOTHYROXINE SODIUM 75 MCG PO TABS: 75.0000 ug | ORAL_TABLET | Freq: Every day | ORAL | Status: DC

## 2015-04-20 ENCOUNTER — Encounter: Payer: Self-pay | Admitting: Family Medicine

## 2015-04-27 ENCOUNTER — Encounter: Payer: Self-pay | Admitting: Family Medicine

## 2015-05-01 ENCOUNTER — Encounter: Payer: Self-pay | Admitting: Family Medicine

## 2015-06-08 ENCOUNTER — Ambulatory Visit: Payer: Medicare Other | Admitting: Family Medicine

## 2015-06-09 ENCOUNTER — Encounter: Payer: Self-pay | Admitting: Family Medicine

## 2015-06-09 ENCOUNTER — Ambulatory Visit (INDEPENDENT_AMBULATORY_CARE_PROVIDER_SITE_OTHER): Payer: Medicare Other | Admitting: Family Medicine

## 2015-06-09 VITALS — BP 118/70 | HR 75 | Temp 98.2°F | Ht 62.25 in | Wt 136.3 lb

## 2015-06-09 DIAGNOSIS — I480 Paroxysmal atrial fibrillation: Secondary | ICD-10-CM | POA: Diagnosis not present

## 2015-06-09 DIAGNOSIS — E039 Hypothyroidism, unspecified: Secondary | ICD-10-CM

## 2015-06-09 DIAGNOSIS — E785 Hyperlipidemia, unspecified: Secondary | ICD-10-CM

## 2015-06-09 DIAGNOSIS — Z23 Encounter for immunization: Secondary | ICD-10-CM

## 2015-06-09 LAB — BASIC METABOLIC PANEL
BUN: 13 mg/dL (ref 6–23)
CALCIUM: 9.4 mg/dL (ref 8.4–10.5)
CO2: 30 meq/L (ref 19–32)
CREATININE: 0.71 mg/dL (ref 0.40–1.20)
Chloride: 101 mEq/L (ref 96–112)
GFR: 84.51 mL/min (ref >60.00)
Glucose, Bld: 91 mg/dL (ref 70–99)
Potassium: 4.5 mEq/L (ref 3.5–5.1)
Sodium: 138 mEq/L (ref 135–145)

## 2015-06-09 LAB — TSH: TSH: 2.6 u[IU]/mL (ref 0.35–4.50)

## 2015-06-09 LAB — LIPID PANEL
CHOL/HDL RATIO: 4
Cholesterol: 195 mg/dL (ref 0–200)
HDL: 45 mg/dL (ref >39.00)
LDL Cholesterol: 121 mg/dL — ABNORMAL HIGH (ref 0–99)
NONHDL: 149.61
TRIGLYCERIDES: 142 mg/dL (ref 0.0–149.0)
VLDL: 28.4 mg/dL (ref 0.0–40.0)

## 2015-06-09 LAB — HEMOGLOBIN A1C: Hgb A1c MFr Bld: 5 % (ref 4.6–6.5)

## 2015-06-09 NOTE — Progress Notes (Signed)
Pre visit review using our clinic review tool, if applicable. No additional management support is needed unless otherwise documented below in the visit note. 

## 2015-06-09 NOTE — Progress Notes (Signed)
HPI:  Follow up:  Hypothyroidism: -on levothyroxine 4mcg daily for a long time, changed to generic in 09/2013, now on 88 twice per week and 63mcg the other days -denies: palpitation, constipation, heat/cold intoeranc, skin changes  Hot Flashes: -on vivelle dot -sees gynecologist for this, on very low dose of transdermal estrogen, s/p hysterectomy  Dyslipidemia: -low hdl  PAF, atrial flutter, PACs: -followed by cardiology/electrophysiology  -s/p ablation in 2014 -on edoxaban, takes verapamil as needed has needed more frequently over the last few months -denies: CP, SOB, DOE  Depressed Mood/Bereavment: -husband passed - grieving but doing well -recovering well  ROS: negative for report of fevers, unintentional weight loss, vision changes, vision loss, hearing loss or change, chest pain, sob, hemoptysis, melena, hematochezia, hematuria, genital discharge or lesions, falls, bleeding or bruising, loc, thoughts of suicide or self harm, memory loss   ROS: See pertinent positives and negatives per HPI.  Past Medical History  Diagnosis Date  . Blood transfusion      and  . GERD (gastroesophageal reflux disease)   . Hyperlipidemia   . Thyroid disease   . Cataract   . Atrial fibrillation and flutter     s/p PVI and CTI by Dr Rayann Heman 04/2013  . Supraventricular tachycardia     AV nodal reentry  s/p RFCA 2001  . Hiatal hernia   . Diverticulosis of colon   . Hx of colonic polyps   . Acquired renal cyst of right kidney   . Anxiety   . Erysipelas   . Fibromyalgia   . MITRAL VALVE PROLAPSE 07/28/2007    Qualifier: Diagnosis of  By: Julien Girt CMA, [name]    . DJD (degenerative joint disease) 10/18/2011  . CHEST WALL PAIN, HX OF 07/28/2007    Qualifier: Diagnosis of  By: Julien Girt CMA, [name]    . Hot flashes - on low dose HRT with gynecologist, Dr. Nori Riis 03/31/2014    Past Surgical History  Procedure Laterality Date  . Abdominal hysterectomy    . Oophorectomy    . Breast biopsie     benign lt. breast  . Colonoscopy    . Colonoscopy    . Upper gastrointestinal endoscopy    . Esophagogastroduodenoscopy    . Cataract extraction bilateral w/ anterior vitrectomy    . Rotator cuff repair  08/2001    Dr. Gladstone Lighter  . Breast biopsiy      2011-benign  . Knee arthroscopy  2012  . Tee without cardioversion N/A 04/29/2013    Procedure: TRANSESOPHAGEAL ECHOCARDIOGRAM (TEE);  Surgeon: Jettie Booze, MD;  Location: [facility] ENDOSCOPY;  Service: Cardiovascular;  Laterality: N/A;  . Ablation  04-29-2013    PVI and CTI by Dr Rayann Heman   . Atrial fibrillation ablation N/A 04/30/2013    Procedure: ATRIAL FIBRILLATION ABLATION;  Surgeon: [name], MD;  Location: [facility] CATH LAB;  Service: Cardiovascular;  Laterality: N/A;    No family history on file.  Social History   Social History  . Marital Status: Married    Spouse Name: N/A  . Number of Children: 1  . Years of Education: N/A   Occupational History  .     Social History Main Topics  . Smoking status: Former Smoker -- 0.50 packs/day for 3 years    Types: Cigarettes    Quit date: 09/19/1966  . Smokeless tobacco: Never Used  . Alcohol Use: 1.8 oz/week    3 Glasses of wine per week     Comment: wine weekly  . Drug Use:  No  . Sexual Activity: Not Asked   Other Topics Concern  . None   Social History Narrative   Work or School: none      Home Situation: lives with husband      Spiritual Beliefs: Christian      Lifestyle:no regular exercise; diet so so              Current outpatient prescriptions:  .  B Complex Vitamins (VITAMIN B COMPLEX PO), Take 1,000 mLs by mouth daily., Disp: , Rfl:  .  Cholecalciferol (VITAMIN D) 1000 UNITS capsule, Take 1,000 Units by mouth daily.  , Disp: , Rfl:  .  docusate sodium (COLACE) 100 MG capsule, Take 100 mg by mouth every evening. , Disp: , Rfl:  .  edoxaban (SAVAYSA) 60 MG TABS tablet, Take 60 mg by mouth daily., Disp: 30 tablet, Rfl: 11 .  estradiol (VIVELLE-DOT) 0.05  MG/24HR, Place 1 patch onto the skin 2 (two) times a week.  , Disp: , Rfl:  .  levothyroxine (SYNTHROID, LEVOTHROID) 75 MCG tablet, Take 1 tablet (75 mcg total) by mouth daily., Disp: 90 tablet, Rfl: 1 .  levothyroxine (SYNTHROID, LEVOTHROID) 88 MCG tablet, Take 88 mcg by mouth 2 (two) times a week. Tuesday and Friday, Disp: , Rfl:  .  Multiple Vitamins-Minerals (MULTIVITAMIN WITH MINERALS) tablet, Take 1 tablet by mouth daily.  , Disp: , Rfl:  .  psyllium (METAMUCIL) 58.6 % powder, Take 1 packet by mouth daily.  , Disp: , Rfl:  .  verapamil (CALAN-SR) 120 MG CR tablet, Take 1 tablet (120 mg total) by mouth daily as needed. For heart palpitations, Disp: 30 tablet, Rfl: 6  EXAM:  Filed Vitals:   06/09/15 0853  BP: 118/70  Pulse: 75  Temp: 98.2 F (36.8 C)    Body mass index is 24.73 kg/(m^2).  GENERAL: vitals reviewed and listed above, alert, oriented, appears well hydrated and in no acute distress  HEENT: atraumatic, conjunttiva clear, no obvious abnormalities on inspection of external nose and ears  NECK: no obvious masses on inspection  LUNGS: clear to auscultation bilaterally, no wheezes, rales or rhonchi, good air movement  CV: HRRR, no peripheral edema  MS: moves all extremities without noticeable abnormality  PSYCH: pleasant and cooperative, no obvious depression or anxiety  ASSESSMENT AND PLAN:  Discussed the following assessment and plan:  Hypothyroidism, unspecified hypothyroidism type - Plan: TSH  Paroxysmal atrial fibrillation - Plan: Basic metabolic panel  Hyperlipemia - Plan: Lipid Panel, Hemoglobin A1c  -FASTING labs -Patient advised to return or notify a doctor immediately if symptoms worsen or persist or new concerns arise.  Patient Instructions  BEFORE YOU LEAVE: -flu shot -labs -reschedule wellness visit for early 2017  -We have ordered labs or studies at this visit. It can take up to 1-2 weeks for results and processing. We will contact you with  instructions IF your results are abnormal. Normal results will be released to your [facility]. If you have not heard from [place] or can not find your results in [facility] in 2 weeks please contact our office.  We recommend the following healthy lifestyle measures: - eat a healthy diet consisting of lots of vegetables, fruits, beans, nuts, seeds, healthy meats such as white chicken and fish and whole grains.  - avoid fried foods, fast food, processed foods, sodas, red meet and other fattening foods.  - get a least 150 minutes of aerobic exercise per week.         KIM,  HANNAH R.

## 2015-06-09 NOTE — Patient Instructions (Signed)
BEFORE YOU LEAVE: -flu shot -labs -reschedule wellness visit for early 2017  -We have ordered labs or studies at this visit. It can take up to 1-2 weeks for results and processing. We will contact you with instructions IF your results are abnormal. Normal results will be released to your [facility]. If you have not heard from [place] or can not find your results in [facility] in 2 weeks please contact our office.  We recommend the following healthy lifestyle measures: - eat a healthy diet consisting of lots of vegetables, fruits, beans, nuts, seeds, healthy meats such as white chicken and fish and whole grains.  - avoid fried foods, fast food, processed foods, sodas, red meet and other fattening foods.  - get a least 150 minutes of aerobic exercise per week.

## 2015-06-10 ENCOUNTER — Encounter: Payer: Self-pay | Admitting: *Deleted

## 2015-06-15 ENCOUNTER — Ambulatory Visit (INDEPENDENT_AMBULATORY_CARE_PROVIDER_SITE_OTHER): Payer: Medicare Other | Admitting: Internal Medicine

## 2015-06-15 ENCOUNTER — Other Ambulatory Visit: Payer: Self-pay | Admitting: Internal Medicine

## 2015-06-15 ENCOUNTER — Encounter: Payer: Self-pay | Admitting: Internal Medicine

## 2015-06-15 VITALS — BP 138/68 | HR 71 | Ht 62.5 in | Wt 137.0 lb

## 2015-06-15 DIAGNOSIS — I480 Paroxysmal atrial fibrillation: Secondary | ICD-10-CM

## 2015-06-15 MED ORDER — WARFARIN SODIUM 5 MG PO TABS: ORAL_TABLET | ORAL | Status: DC

## 2015-06-15 NOTE — Addendum Note (Signed)
Addended byAlvis Lemmings C on: 06/15/2015 11:12 AM   Modules accepted: Orders

## 2015-06-15 NOTE — Patient Instructions (Addendum)
Medication Instructions: - Start Coumadin (Warfarin) 5 mg once daily today  Labwork: - none  Procedures/Testing: - none  Follow-Up: - Your physician recommends that you schedule a follow-up appointment in: Thursday 06/18/15 at 12:00 pm- [place] physician wants you to follow-up in: 1 year with Dr. Rayann Heman. You will receive a reminder letter in the mail two months in advance. If you don't receive a letter, please call our office to schedule the follow-up appointment.  Any Additional Special Instructions Will Be Listed Below (If Applicable). - none

## 2015-06-15 NOTE — Progress Notes (Signed)
PCP:  Colin Benton, MD  The patient presents today for routine electrophysiology followup.  Since last being seen in our clinic, the patient reports doing very well. Her husband has died.  We discussed this at length today.  She is coping well. She has no prescription coverage and was on savaysa for a year at $4/month.  She states that this has expired and now she cannot afford anticoagulants.  Today, she denies symptoms of chest pain, shortness of breath, orthopnea, PND, lower extremity edema, dizziness, presyncope, syncope, or neurologic sequela.  The patient feels that she is tolerating medications without difficulties and is otherwise without complaint today.   Past Medical History  Diagnosis Date  . Blood transfusion   . GERD (gastroesophageal reflux disease)   . Hyperlipidemia   . Thyroid disease   . Cataract   . Atrial fibrillation and flutter     s/p PVI and CTI by Dr Rayann Heman 04/2013  . Supraventricular tachycardia     AV nodal reentry  s/p RFCA 2001  . Hiatal hernia   . Diverticulosis of colon   . Hx of colonic polyps   . Acquired renal cyst of right kidney   . Anxiety   . Erysipelas   . Fibromyalgia   . MITRAL VALVE PROLAPSE 07/28/2007    Qualifier: Diagnosis of  By: Julien Girt CMA, [name]    . DJD (degenerative joint disease) 10/18/2011  . CHEST WALL PAIN, HX OF 07/28/2007    Qualifier: Diagnosis of  By: Julien Girt CMA, [name]    . Hot flashes - on low dose HRT with gynecologist, Dr. Nori Riis 03/31/2014   Past Surgical History  Procedure Laterality Date  . Abdominal hysterectomy    . Oophorectomy    . Breast biopsy Left     benign  . Colonoscopy    . Colonoscopy    . Upper gastrointestinal endoscopy    . Esophagogastroduodenoscopy    . Cataract extraction bilateral w/ anterior vitrectomy    . Rotator cuff repair  08/2001    Dr. Gladstone Lighter  . Breast biopsy  2011    benign  . Knee arthroscopy  2012  . Tee without cardioversion N/A 04/29/2013    Procedure: TRANSESOPHAGEAL  ECHOCARDIOGRAM (TEE);  Surgeon: Jettie Booze, MD;  Location: [facility] ENDOSCOPY;  Service: Cardiovascular;  Laterality: N/A;  . Atrial fibrillation ablation N/A 04/30/2013    Procedure: ATRIAL FIBRILLATION ABLATION;  Surgeon: [name], MD;  Location: [facility] CATH LAB;  Service: Cardiovascular;  Laterality: N/A;PVI and CTI by Dr Rayann Heman     Current Outpatient Prescriptions  Medication Sig Dispense Refill  . B Complex Vitamins (VITAMIN B COMPLEX PO) Take 1,000 mLs by mouth daily.    . Cholecalciferol (VITAMIN D) 1000 UNITS capsule Take 1,000 Units by mouth daily.      [name] Kitchen docusate sodium (COLACE) 100 MG capsule Take 100 mg by mouth every evening.     [name] Kitchen estradiol (VIVELLE-DOT) 0.05 MG/24HR Place 1 patch onto the skin 2 (two) times a week.      . levothyroxine (SYNTHROID, LEVOTHROID) 75 MCG tablet Take 75 mcg by mouth 5 times a week. Monday, Wednesday, Thursday, Saturday and Sunday    . levothyroxine (SYNTHROID, LEVOTHROID) 88 MCG tablet Take 88 mcg by mouth 2 (two) times a week. Tuesday and Friday    . Multiple Vitamins-Minerals (MULTIVITAMIN WITH MINERALS) tablet Take 1 tablet by mouth daily.      . psyllium (METAMUCIL) 58.6 % powder Take 1 packet by mouth daily as needed (  fiber).     . verapamil (CALAN-SR) 120 MG CR tablet Take 1 tablet (120 mg total) by mouth daily as needed. For heart palpitations 30 tablet 6   No current facility-administered medications for this visit.    Allergies  Allergen Reactions  . Propafenone Other (See Comments)    BP too low  . Codeine Other (See Comments)    REACTION: nausea/dizziness  . Codeine Phosphate Other (See Comments)    REACTION: nausea,dizzy  . Tramadol Hcl Other (See Comments)    REACTION: nausea--dizzy    Social History   Social History  . Marital Status: Married    Spouse Name: N/A  . Number of Children: 1  . Years of Education: N/A   Occupational History  .     Social History Main Topics  . Smoking status: Former Smoker -- 0.50  packs/day for 3 years    Types: Cigarettes    Quit date: 09/19/1966  . Smokeless tobacco: Never Used  . Alcohol Use: 1.8 oz/week    3 Glasses of wine per week     Comment: wine weekly  . Drug Use: No  . Sexual Activity: Not on file   Other Topics Concern  . Not on file   Social History Narrative   Work or School: none      Home Situation: lives with husband      Spiritual Beliefs: Christian      Lifestyle:no regular exercise; diet so so            Physical Exam: Filed Vitals:   06/15/15 1023  BP: 138/68  Pulse: 71  Height: 5' 2.5" (1.588 m)  Weight: 137 lb (62.143 kg)    GEN- The patient is well appearing, alert and oriented x 3 today.   Head- normocephalic, atraumatic Eyes-  Sclera clear, conjunctiva pink Ears- hearing intact Oropharynx- clear Neck- supple,  Lungs- Clear to ausculation bilaterally, normal work of breathing Heart- Regular rate and rhythm, no murmurs, rubs or gallops, PMI not laterally displaced GI- soft, NT, ND, + BS Extremities- no clubbing, cyanosis, or edema Neuro- strength and sensation are intact  EKG today reveals sinus rhythm   Assessment and Plan:  1. afib  Doing well s/p ablation  Maintaining sinus rhythm off of AAD therapy  chads2vasc score is at least 3.  We discussed anticoagualtion today.  She is clear that she cannot afford noac therapy.  She would like to consider coumadin.  We discussed ILR implant and surveillance for afib as an option. For now, she would like to try coumadin.  Return to see me in 1 year  [name] MD, [facility] 06/15/2015 10:57 AM

## 2015-06-16 ENCOUNTER — Encounter: Payer: Self-pay | Admitting: Internal Medicine

## 2015-06-16 NOTE — Telephone Encounter (Signed)
Pharmacy requesting refill on savaysa. See email from patient that she has decided to stay on it. Please advise. Thanks, MI

## 2015-06-16 NOTE — Telephone Encounter (Signed)
If patient prefers savaysa, continue savaysa and do not start coumadin

## 2015-06-18 ENCOUNTER — Ambulatory Visit: Payer: Medicare Other

## 2015-07-03 ENCOUNTER — Encounter: Payer: Self-pay | Admitting: Family Medicine

## 2015-07-21 ENCOUNTER — Other Ambulatory Visit: Payer: Self-pay | Admitting: Internal Medicine

## 2015-08-21 ENCOUNTER — Encounter: Payer: Medicare Other | Admitting: Family Medicine

## 2015-09-22 ENCOUNTER — Other Ambulatory Visit: Payer: Self-pay | Admitting: *Deleted

## 2015-09-22 ENCOUNTER — Other Ambulatory Visit: Payer: Self-pay | Admitting: Internal Medicine

## 2015-09-22 MED ORDER — VERAPAMIL HCL ER 120 MG PO TBCR: 120.0000 mg | EXTENDED_RELEASE_TABLET | Freq: Every day | ORAL | Status: DC | PRN

## 2015-09-23 ENCOUNTER — Ambulatory Visit (INDEPENDENT_AMBULATORY_CARE_PROVIDER_SITE_OTHER): Payer: Medicare Other | Admitting: Family Medicine

## 2015-09-23 ENCOUNTER — Encounter: Payer: Self-pay | Admitting: Family Medicine

## 2015-09-23 VITALS — BP 116/72 | HR 77 | Temp 97.6°F | Ht 62.5 in | Wt 139.3 lb

## 2015-09-23 DIAGNOSIS — Z0000 Encounter for general adult medical examination without abnormal findings: Secondary | ICD-10-CM | POA: Diagnosis not present

## 2015-09-23 DIAGNOSIS — E039 Hypothyroidism, unspecified: Secondary | ICD-10-CM

## 2015-09-23 DIAGNOSIS — I480 Paroxysmal atrial fibrillation: Secondary | ICD-10-CM | POA: Diagnosis not present

## 2015-09-23 DIAGNOSIS — R232 Flushing: Secondary | ICD-10-CM

## 2015-09-23 LAB — CBC
HCT: 41.6 % (ref 36.0–46.0)
Hemoglobin: 13.9 g/dL (ref 12.0–15.0)
MCHC: 33.4 g/dL (ref 30.0–36.0)
MCV: 97 fl (ref 78.0–100.0)
Platelets: 256 10*3/uL (ref 150.0–400.0)
RBC: 4.29 Mil/uL (ref 3.87–5.11)
RDW: 13.6 % (ref 11.5–15.5)
WBC: 7.8 10*3/uL (ref 4.0–10.5)

## 2015-09-23 LAB — TSH: TSH: 2.3 u[IU]/mL (ref 0.35–4.50)

## 2015-09-23 NOTE — Patient Instructions (Signed)
BEFORE YOU LEAVE: -labs -follow up in 6 months  We recommend the following healthy lifestyle measures: - eat a healthy whole foods diet consisting of regular small meals composed of vegetables, fruits, beans, nuts, seeds, healthy meats such as white chicken and fish and whole grains.  - avoid sweets, white starchy foods, fried foods, fast food, processed foods, sodas, red meet and other fattening foods.  - get a least 150-300 minutes of aerobic exercise per week.   -We have ordered labs or studies at this visit. It can take up to 1-2 weeks for results and processing. We will contact you with instructions IF your results are abnormal. Normal results will be released to your [facility]. If you have not heard from [place] or can not find your results in [facility] in 2 weeks please contact our office.

## 2015-09-23 NOTE — Progress Notes (Signed)
Pre visit review using our clinic review tool, if applicable. No additional management support is needed unless otherwise documented below in the visit note. 

## 2015-09-23 NOTE — Progress Notes (Signed)
Medicare Annual Preventive Care Visit  (initial annual wellness or annual wellness exam)  Concerns and/or follow up today:  Hypothyroidism: -on levothyroxine 66mcg daily for a long time, changed to generic in 09/2013, now on 88 twice per week and 46mcg the other days -denies: palpitation, constipation, heat/cold intoeranc, skin changes  Hot Flashes: -on vivelle dot -sees gynecologist for this, on very low dose of transdermal estrogen, s/p hysterectomy  Dyslipidemia: -low hdl  PAF, atrial flutter, PACs: -followed by cardiology/electrophysiology  -s/p ablation in 2014 -on edoxaban, takes verapamil as needed -denies: CP, SOB, DOE  Depressed Mood/Bereavment: -husband passed - grieving but doing well -anniversaries and holidays are tough -she is in a widows group at church and this has been very helpful, feels is coping well  ROS: negative for report of fevers, unintentional weight loss, vision changes, vision loss, hearing loss or change, chest pain, sob, hemoptysis, melena, hematochezia, hematuria, genital discharge or lesions, falls, bleeding or bruising, loc, thoughts of suicide or self harm, memory loss  1.) Patient-completed health risk assessment  - completed and reviewed, see scanned documentation  2.) Review of Medical History: -PMH, PSH, Family History and current specialty and care providers reviewed and updated and listed below  - see scanned in document in chart and below  Past Medical History  Diagnosis Date  . Blood transfusion   . GERD (gastroesophageal reflux disease)   . Hyperlipidemia   . Thyroid disease   . Cataract   . Atrial fibrillation and flutter ([place])     s/p PVI and CTI by Dr Rayann Heman 04/2013  . Supraventricular tachycardia ([place])     AV nodal reentry  s/p RFCA 2001  . Hiatal hernia   . Diverticulosis of colon   . Hx of colonic polyps   . Acquired renal cyst of right kidney   . Anxiety   . Erysipelas   . Fibromyalgia   . MITRAL VALVE PROLAPSE  07/28/2007    Qualifier: Diagnosis of  By: Julien Girt CMA, [name]    . DJD (degenerative joint disease) 10/18/2011  . CHEST WALL PAIN, HX OF 07/28/2007    Qualifier: Diagnosis of  By: Julien Girt CMA, [name]    . Hot flashes - on low dose HRT with gynecologist, Dr. Nori Riis 03/31/2014    Past Surgical History  Procedure Laterality Date  . Abdominal hysterectomy    . Oophorectomy    . Breast biopsy Left     benign  . Colonoscopy    . Colonoscopy    . Upper gastrointestinal endoscopy    . Esophagogastroduodenoscopy    . Cataract extraction bilateral w/ anterior vitrectomy    . Rotator cuff repair  08/2001    Dr. Gladstone Lighter  . Breast biopsy  2011    benign  . Knee arthroscopy  2012  . Tee without cardioversion N/A 04/29/2013    Procedure: TRANSESOPHAGEAL ECHOCARDIOGRAM (TEE);  Surgeon: Jettie Booze, MD;  [facility] ENDOSCOPY;  Service: Cardiovascular;  Laterality: N/A;  . Atrial fibrillation ablation N/A 04/30/2013    Procedure: ATRIAL FIBRILLATION ABLATION;  Surgeon: [name], MD;  Location[facility] CATH LAB;  Service: Cardiovascular;  Laterality: N/A;PVI and CTI by Dr Rayann Heman     Social History   Social History  . Marital Status: Married    Spouse Name: N/A  . Number of Children: 1  . Years of Education: N/A   Occupational History  .     Social History Main Topics  . Smoking status: Former Smoker -- 0.50 packs/day for  3 years    Types: Cigarettes    Quit date: 09/19/1966  . Smokeless tobacco: Never Used  . Alcohol Use: 1.8 oz/week    3 Glasses of wine per week     Comment: wine weekly  . Drug Use: No  . Sexual Activity: Not on file   Other Topics Concern  . Not on file   Social History Narrative   Work or School: none      Home Situation: lives with husband      Spiritual Beliefs: Christian      Lifestyle:no regular exercise; diet so so             No family history on file.  Current Outpatient Prescriptions on File Prior to Visit  Medication Sig Dispense Refill   . B Complex Vitamins (VITAMIN B COMPLEX PO) Take 1,000 mLs by mouth daily.    . Cholecalciferol (VITAMIN D) 1000 UNITS capsule Take 1,000 Units by mouth daily.      [name] Kitchen docusate sodium (COLACE) 100 MG capsule Take 100 mg by mouth every evening.     [name] Kitchen estradiol (VIVELLE-DOT) 0.05 MG/24HR Place 1 patch onto the skin 2 (two) times a week.      . levothyroxine (SYNTHROID, LEVOTHROID) 75 MCG tablet Take 75 mcg by mouth 5 times a week. Monday, Wednesday, Thursday, Saturday and Sunday    . levothyroxine (SYNTHROID, LEVOTHROID) 88 MCG tablet Take 88 mcg by mouth 2 (two) times a week. Tuesday and Friday    . Multiple Vitamins-Minerals (MULTIVITAMIN WITH MINERALS) tablet Take 1 tablet by mouth daily.      . psyllium (METAMUCIL) 58.6 % powder Take 1 packet by mouth daily as needed (fiber).     [name] Kitchen SAVAYSA 60 MG TABS tablet TAKE ONE TABLET BY MOUTH ONCE DAILY 30 tablet 11  . verapamil (CALAN-SR) 120 MG CR tablet Take 1 tablet (120 mg total) by mouth daily as needed. For heart palpitations 30 tablet 7   No current facility-administered medications on file prior to visit.     3.) Review of functional ability and level of safety:  Any difficulty hearing?  NO  History of falling?  NO  Any trouble with IADLs - using a phone, using transportation, grocery shopping, preparing meals, doing housework, doing laundry, taking medications and managing money? NO  Advance Directives? Yes, copy requested  See summary of recommendations in Patient Instructions below.  4.) Physical Exam Filed Vitals:   09/23/15 0958  BP: 116/72  Pulse: 77  Temp: 97.6 F (36.4 C)   Estimated body mass index is 25.06 kg/(m^2) as calculated from the following:   Height as of this encounter: 5' 2.5" (1.588 m).   Weight as of this encounter: 139 lb 4.8 oz (63.186 kg).  EKG (optional): deferred  General: alert, appear well hydrated and in no acute distress  HEENT: visual acuity grossly intact  CV: HRRR  Lungs: CTA  bilaterally  Psych: pleasant and cooperative, no obvious depression or anxiety  Cognitive function grossly intact  See patient instructions for recommendations.  Education and counseling regarding the above review of health provided with a plan for the following: -see scanned patient completed form for further details -fall prevention strategies discussed  -healthy lifestyle discussed -importance and resources for completing advanced directives discussed -see patient instructions below for any other recommendations provided  4)The following written screening schedule of preventive measures were reviewed with assessment and plan made per below, orders and patient instructions:      AAA  screening done if applicable     Alcohol screening done     Obesity Screening and counseling done     STI screening (Hep C if born [birth year]) offered and per pt wishes not done     Tobacco Screening done        Pneumococcal (PPSV23 -one dose after 49, one before if risk factors), influenza yearly and hepatitis B vaccines (if high risk - end stage renal disease, IV drugs, homosexual men, live in home for mentally retarded, hemophilia receiving factors) ASSESSMENT/PLAN: done if applicable      Screening mammograph (yearly if >40) ASSESSMENT/PLAN: utd - does breast exams with gyn      Screening Pap smear/pelvic exam (q2 years) ASSESSMENT/PLAN: n/a, declined - dose pelvic q2y with gyn      Prostate cancer screening ASSESSMENT/PLAN: n/a, declined      Colorectal cancer screening (FOBT yearly or flex sig q4y or colonoscopy q10y or barium enema q4y) ASSESSMENT/PLAN: utd or ordered      Diabetes outpatient self-management training services ASSESSMENT/PLAN: utd or done      Bone mass measurements(covered q2y if indicated - estrogen def, osteoporosis, hyperparathyroid, vertebral abnormalities, osteoporosis or steroids) ASSESSMENT/PLAN: discussed, does with gyn per her report in their office       Screening for glaucoma(q1y if high risk - diabetes, FH, AA and > 50 or hispanic and > 65) ASSESSMENT/PLAN: utd or advised      Medical nutritional therapy for individuals with diabetes or renal disease ASSESSMENT/PLAN: n/a      Cardiovascular screening blood tests (lipids q5y) ASSESSMENT/PLAN: done      Diabetes screening tests ASSESSMENT/PLAN: done   7.) Summary: -risk factors and conditions per above assessment were discussed and treatment, recommendations and referrals were offered per documentation above and orders and patient instructions.  Medicare annual wellness visit, subsequent  Hypothyroidism, unspecified hypothyroidism type - Plan: TSH  Paroxysmal atrial fibrillation ([place]) - Plan: CBC (no diff)  Hot flashes - on low dose HRT with gynecologist, Dr. Nori Riis  Patient Instructions  BEFORE YOU LEAVE: -labs -follow up in 6 months  We recommend the following healthy lifestyle measures: - eat a healthy whole foods diet consisting of regular small meals composed of vegetables, fruits, beans, nuts, seeds, healthy meats such as white chicken and fish and whole grains.  - avoid sweets, white starchy foods, fried foods, fast food, processed foods, sodas, red meet and other fattening foods.  - get a least 150-300 minutes of aerobic exercise per week.   -We have ordered labs or studies at this visit. It can take up to 1-2 weeks for results and processing. We will contact you with instructions IF your results are abnormal. Normal results will be released to your [facility]. If you have not heard from [place] or can not find your results in [facility] in 2 weeks please contact our office.

## 2015-11-16 DIAGNOSIS — Z01419 Encounter for gynecological examination (general) (routine) without abnormal findings: Secondary | ICD-10-CM | POA: Diagnosis not present

## 2015-11-16 DIAGNOSIS — N6452 Nipple discharge: Secondary | ICD-10-CM | POA: Diagnosis not present

## 2015-11-16 DIAGNOSIS — Z1272 Encounter for screening for malignant neoplasm of vagina: Secondary | ICD-10-CM | POA: Diagnosis not present

## 2015-11-16 DIAGNOSIS — Z6824 Body mass index (BMI) 24.0-24.9, adult: Secondary | ICD-10-CM | POA: Diagnosis not present

## 2015-11-18 ENCOUNTER — Other Ambulatory Visit: Payer: Self-pay | Admitting: Family Medicine

## 2015-11-19 NOTE — Telephone Encounter (Signed)
Rx approved by Dr Maudie Mercury and refills sent to the pts pharmacy.

## 2015-12-24 DIAGNOSIS — M25511 Pain in right shoulder: Secondary | ICD-10-CM | POA: Diagnosis not present

## 2015-12-24 DIAGNOSIS — M25552 Pain in left hip: Secondary | ICD-10-CM | POA: Diagnosis not present

## 2015-12-29 DIAGNOSIS — N6002 Solitary cyst of left breast: Secondary | ICD-10-CM | POA: Diagnosis not present

## 2015-12-29 DIAGNOSIS — N6001 Solitary cyst of right breast: Secondary | ICD-10-CM | POA: Diagnosis not present

## 2015-12-30 ENCOUNTER — Other Ambulatory Visit: Payer: Self-pay | Admitting: Radiology

## 2015-12-30 DIAGNOSIS — N63 Unspecified lump in breast: Secondary | ICD-10-CM | POA: Diagnosis not present

## 2015-12-30 DIAGNOSIS — N6011 Diffuse cystic mastopathy of right breast: Secondary | ICD-10-CM | POA: Diagnosis not present

## 2016-01-12 ENCOUNTER — Encounter: Payer: Self-pay | Admitting: Family Medicine

## 2016-01-21 DIAGNOSIS — M7062 Trochanteric bursitis, left hip: Secondary | ICD-10-CM | POA: Diagnosis not present

## 2016-01-24 ENCOUNTER — Other Ambulatory Visit: Payer: Self-pay | Admitting: Family Medicine

## 2016-03-01 DIAGNOSIS — L239 Allergic contact dermatitis, unspecified cause: Secondary | ICD-10-CM | POA: Diagnosis not present

## 2016-03-28 ENCOUNTER — Ambulatory Visit (INDEPENDENT_AMBULATORY_CARE_PROVIDER_SITE_OTHER): Payer: Medicare Other | Admitting: Family Medicine

## 2016-03-28 ENCOUNTER — Encounter: Payer: Self-pay | Admitting: Family Medicine

## 2016-03-28 VITALS — BP 110/80 | HR 75 | Temp 98.0°F | Ht 62.5 in | Wt 143.0 lb

## 2016-03-28 DIAGNOSIS — M25552 Pain in left hip: Secondary | ICD-10-CM

## 2016-03-28 DIAGNOSIS — E039 Hypothyroidism, unspecified: Secondary | ICD-10-CM | POA: Diagnosis not present

## 2016-03-28 DIAGNOSIS — I480 Paroxysmal atrial fibrillation: Secondary | ICD-10-CM

## 2016-03-28 LAB — TSH: TSH: 2.52 u[IU]/mL (ref 0.35–4.50)

## 2016-03-28 NOTE — Progress Notes (Signed)
Pre visit review using our clinic review tool, if applicable. No additional management support is needed unless otherwise documented below in the visit note. 

## 2016-03-28 NOTE — Progress Notes (Signed)
HPI:  Hypothyroidism: -on levothyroxine 88 twice per week and 68mcg the other days -denies: palpitation, constipation, heat/cold intoeranc, skin changes -has gained some weight, not exercising due to hip pain  Hip pain, left: -for several months -seeing ortho -had inj, no rehab -not exercising and has worsened  Hot Flashes: -on vivelle dot -sees gynecologist for this, on very low dose of transdermal estrogen, s/p hysterectomy  Dyslipidemia: -low hdl  PAF, atrial flutter, PACs: -followed by cardiology/electrophysiology  -s/p ablation in 2014 -on edoxaban, takes verapamil as needed -denies: CP, SOB, DOE  ROS: See pertinent positives and negatives per HPI.  Past Medical History  Diagnosis Date  . Blood transfusion   . GERD (gastroesophageal reflux disease)   . Hyperlipidemia   . Thyroid disease   . Cataract   . Atrial fibrillation and flutter ([place])     s/p PVI and CTI by Dr Rayann Heman 04/2013  . Supraventricular tachycardia ([place])     AV nodal reentry  s/p RFCA 2001  . Hiatal hernia   . Diverticulosis of colon   . Hx of colonic polyps   . Acquired renal cyst of right kidney   . Anxiety   . Erysipelas   . Fibromyalgia   . MITRAL VALVE PROLAPSE 07/28/2007    Qualifier: Diagnosis of  By: Julien Girt CMA, [name]    . DJD (degenerative joint disease) 10/18/2011  . CHEST WALL PAIN, HX OF 07/28/2007    Qualifier: Diagnosis of  By: Julien Girt CMA, [name]    . Hot flashes - on low dose HRT with gynecologist, Dr. Nori Riis 03/31/2014    Past Surgical History  Procedure Laterality Date  . Abdominal hysterectomy    . Oophorectomy    . Breast biopsy Left     benign  . Colonoscopy    . Colonoscopy    . Upper gastrointestinal endoscopy    . Esophagogastroduodenoscopy    . Cataract extraction bilateral w/ anterior vitrectomy    . Rotator cuff repair  08/2001    Dr. Gladstone Lighter  . Breast biopsy  2011    benign  . Knee arthroscopy  2012  . Tee without cardioversion N/A 04/29/2013    Procedure:  TRANSESOPHAGEAL ECHOCARDIOGRAM (TEE);  Surgeon: Jettie Booze, MD;  Location: [facility] ENDOSCOPY;  Service: Cardiovascular;  Laterality: N/A;  . Atrial fibrillation ablation N/A 04/30/2013    Procedure: ATRIAL FIBRILLATION ABLATION;  Surgeon: [name], MD;  Location: [facility] CATH LAB;  Service: Cardiovascular;  Laterality: N/A;PVI and CTI by Dr Rayann Heman     No family history on file.  Social History   Social History  . Marital Status: Married    Spouse Name: N/A  . Number of Children: 1  . Years of Education: N/A   Occupational History  .     Social History Main Topics  . Smoking status: Former Smoker -- 0.50 packs/day for 3 years    Types: Cigarettes    Quit date: 09/19/1966  . Smokeless tobacco: Never Used  . Alcohol Use: 1.8 oz/week    3 Glasses of wine per week     Comment: wine weekly  . Drug Use: No  . Sexual Activity: Not Asked   Other Topics Concern  . None   Social History Narrative   Work or School: none      Home Situation: lives with husband      Spiritual Beliefs: Christian      Lifestyle:no regular exercise; diet so so  Current outpatient prescriptions:  .  B Complex Vitamins (VITAMIN B COMPLEX PO), Take 1,000 mLs by mouth daily., Disp: , Rfl:  .  Cholecalciferol (VITAMIN D) 1000 UNITS capsule, Take 1,000 Units by mouth daily.  , Disp: , Rfl:  .  docusate sodium (COLACE) 100 MG capsule, Take 100 mg by mouth every evening. , Disp: , Rfl:  .  estradiol (VIVELLE-DOT) 0.05 MG/24HR, Place 1 patch onto the skin 2 (two) times a week.  , Disp: , Rfl:  .  levothyroxine (SYNTHROID, LEVOTHROID) 75 MCG tablet, Take 75 mcg by mouth 5 times a week. Monday, Wednesday, Thursday, Saturday and Sunday, Disp: , Rfl:  .  levothyroxine (SYNTHROID, LEVOTHROID) 88 MCG tablet, 93mcg by mouth 2 days per week; take 28mcg on all other days, Disp: 90 tablet, Rfl: 0 .  Multiple Vitamins-Minerals (MULTIVITAMIN WITH MINERALS) tablet, Take 1 tablet by mouth daily.  , Disp:  , Rfl:  .  psyllium (METAMUCIL) 58.6 % powder, Take 1 packet by mouth daily as needed (fiber). , Disp: , Rfl:  .  SAVAYSA 60 MG TABS tablet, TAKE ONE TABLET BY MOUTH ONCE DAILY, Disp: 30 tablet, Rfl: 11 .  verapamil (CALAN-SR) 120 MG CR tablet, Take 1 tablet (120 mg total) by mouth daily as needed. For heart palpitations, Disp: 30 tablet, Rfl: 7  EXAM:  Filed Vitals:   03/28/16 0919  BP: 110/80  Pulse: 75  Temp: 98 F (36.7 C)    Body mass index is 25.72 kg/(m^2).  GENERAL: vitals reviewed and listed above, alert, oriented, appears well hydrated and in no acute distress  HEENT: atraumatic, conjunttiva clear, no obvious abnormalities on inspection of external nose and ears  NECK: no obvious masses on inspection  LUNGS: clear to auscultation bilaterally, no wheezes, rales or rhonchi, good air movement  CV: HRRR, no peripheral edema  MS: moves all extremities without noticeable abnormality  PSYCH: pleasant and cooperative, no obvious depression or anxiety  ASSESSMENT AND PLAN:  Discussed the following assessment and plan:  Hypothyroidism, unspecified hypothyroidism type  Paroxysmal atrial fibrillation (HCC)  Left hip pain - -seeing ortho, bursitis  -TSH -lifestyle recs -HEP, pt to follow up with ortho about hip pain -CPE 6 months -Patient advised to return or notify a doctor immediately if symptoms worsen or persist or new concerns arise.  Patient Instructions  BEFORE YOU LEAVE: -greater trochanteric exercises -follow up: cpe in 6 months -labs  We recommend the following healthy lifestyle: 1) Eat a healthy clean diet with avoidance of (less then 1 serving per week) processed foods, sweetened drinks, white starches, red meat, fast foods and sweets and consisting of: * 5-9 servings per day of fresh or frozen fruits and vegetables (not corn or potatoes, not dried or canned) *nuts and seeds, beans *olives and olive oil *small portions of lean meats such as fish and  white chicken  *small portions of whole grains -Get at least 150 minutes of sweaty aerobic exercise per week -reduce stress - counseling, meditation, relaxation to balance other aspects of your life      Colin Benton R., DO

## 2016-03-28 NOTE — Addendum Note (Signed)
Addended by: Lucretia Kern on: 03/28/2016 09:55 AM   Modules accepted: Orders

## 2016-03-28 NOTE — Patient Instructions (Signed)
BEFORE YOU LEAVE: -greater trochanteric exercises -follow up: cpe in 6 months -labs  We recommend the following healthy lifestyle: 1) Eat a healthy clean diet with avoidance of (less then 1 serving per week) processed foods, sweetened drinks, white starches, red meat, fast foods and sweets and consisting of: * 5-9 servings per day of fresh or frozen fruits and vegetables (not corn or potatoes, not dried or canned) *nuts and seeds, beans *olives and olive oil *small portions of lean meats such as fish and white chicken  *small portions of whole grains -Get at least 150 minutes of sweaty aerobic exercise per week -reduce stress - counseling, meditation, relaxation to balance other aspects of your life

## 2016-04-28 ENCOUNTER — Encounter: Payer: Self-pay | Admitting: Family Medicine

## 2016-04-28 DIAGNOSIS — N6001 Solitary cyst of right breast: Secondary | ICD-10-CM | POA: Diagnosis not present

## 2016-05-09 ENCOUNTER — Encounter: Payer: Self-pay | Admitting: Family Medicine

## 2016-05-09 DIAGNOSIS — L821 Other seborrheic keratosis: Secondary | ICD-10-CM | POA: Diagnosis not present

## 2016-05-09 DIAGNOSIS — D485 Neoplasm of uncertain behavior of skin: Secondary | ICD-10-CM | POA: Diagnosis not present

## 2016-05-20 ENCOUNTER — Other Ambulatory Visit: Payer: Self-pay | Admitting: Family Medicine

## 2016-05-30 DIAGNOSIS — M7062 Trochanteric bursitis, left hip: Secondary | ICD-10-CM | POA: Diagnosis not present

## 2016-05-30 DIAGNOSIS — M25511 Pain in right shoulder: Secondary | ICD-10-CM | POA: Diagnosis not present

## 2016-06-09 ENCOUNTER — Ambulatory Visit (INDEPENDENT_AMBULATORY_CARE_PROVIDER_SITE_OTHER): Payer: Medicare Other

## 2016-06-09 DIAGNOSIS — Z23 Encounter for immunization: Secondary | ICD-10-CM | POA: Diagnosis not present

## 2016-06-15 ENCOUNTER — Encounter: Payer: Self-pay | Admitting: Internal Medicine

## 2016-06-15 ENCOUNTER — Ambulatory Visit (INDEPENDENT_AMBULATORY_CARE_PROVIDER_SITE_OTHER): Payer: Medicare Other | Admitting: Internal Medicine

## 2016-06-15 VITALS — BP 110/80 | HR 71 | Ht 62.5 in | Wt 140.2 lb

## 2016-06-15 DIAGNOSIS — I480 Paroxysmal atrial fibrillation: Secondary | ICD-10-CM | POA: Diagnosis not present

## 2016-06-15 MED ORDER — EDOXABAN TOSYLATE 60 MG PO TABS: 60.0000 mg | ORAL_TABLET | Freq: Every day | ORAL | 11 refills | Status: DC

## 2016-06-15 NOTE — Patient Instructions (Signed)

## 2016-06-15 NOTE — Progress Notes (Signed)
PCP:  Colin Benton, MD  The patient presents today for routine electrophysiology followup.  Since last being seen in our clinic, the patient reports doing very well.  She continues to cope with the death of her spouse (over a year ago).  She is a member of a widows group at [employer]. She has rare and brief palpitations.   Denies AF.  Today, she denies symptoms of chest pain, shortness of breath, orthopnea, PND, lower extremity edema, dizziness, presyncope, syncope, or neurologic sequela.  The patient feels that she is tolerating medications without difficulties and is otherwise without complaint today.   Past Medical History:  Diagnosis Date  . Acquired renal cyst of right kidney   . Anxiety   . Atrial fibrillation and flutter ([place])    s/p PVI and CTI by Dr Rayann Heman 04/2013  . Blood transfusion   . Cataract   . CHEST WALL PAIN, HX OF 07/28/2007   Qualifier: Diagnosis of  By: Julien Girt CMA, [name]    . Diverticulosis of colon   . DJD (degenerative joint disease) 10/18/2011  . Erysipelas   . Fibromyalgia   . GERD (gastroesophageal reflux disease)   . Hiatal hernia   . Hot flashes - on low dose HRT with gynecologist, Dr. Nori Riis 03/31/2014  . Hx of colonic polyps   . Hyperlipidemia   . MITRAL VALVE PROLAPSE 07/28/2007   Qualifier: Diagnosis of  By: Julien Girt CMA, [name]    . Supraventricular tachycardia ([place])    AV nodal reentry  s/p RFCA 2001  . Thyroid disease    Past Surgical History:  Procedure Laterality Date  . ABDOMINAL HYSTERECTOMY    . ATRIAL FIBRILLATION ABLATION N/A 04/30/2013   Procedure: ATRIAL FIBRILLATION ABLATION;  Surgeon: [name], MD;  Location[facility] CATH LAB;  Service: Cardiovascular;  Laterality: N/A;PVI and CTI by Dr Rayann Heman   . BREAST BIOPSY Left    benign  . BREAST BIOPSY  2011   benign  . CATARACT EXTRACTION BILATERAL W/ ANTERIOR VITRECTOMY    . COLONOSCOPY    . COLONOSCOPY    . ESOPHAGOGASTRODUODENOSCOPY    . KNEE ARTHROSCOPY  2012  . OOPHORECTOMY    . ROTATOR  CUFF REPAIR  08/2001   Dr. Gladstone Lighter  . TEE WITHOUT CARDIOVERSION N/A 04/29/2013   Procedure: TRANSESOPHAGEAL ECHOCARDIOGRAM (TEE);  Surgeon: Jettie Booze, MD;  Location: [place];  Service: Cardiovascular;  Laterality: N/A;  . UPPER GASTROINTESTINAL ENDOSCOPY      Current Outpatient Prescriptions  Medication Sig Dispense Refill  . B Complex Vitamins (VITAMIN B COMPLEX PO) Take 1,000 mLs by mouth daily.    . Cholecalciferol (VITAMIN D) 1000 UNITS capsule Take 1,000 Units by mouth daily.      [name] Kitchen docusate sodium (COLACE) 100 MG capsule Take 100 mg by mouth every evening.     [name] Kitchen estradiol (VIVELLE-DOT) 0.05 MG/24HR Place 1 patch onto the skin 2 (two) times a week.      . levothyroxine (SYNTHROID, LEVOTHROID) 75 MCG tablet Take 75 mcg by mouth 5 times a week. Monday, Wednesday, Thursday, Saturday and Sunday    . levothyroxine (SYNTHROID, LEVOTHROID) 88 MCG tablet TAKE 88MCG BY MOUTH 2 DAYS PER WEEK, TAKE 75 MCG ON ALL OTHER DAYS 90 tablet 1  . Multiple Vitamins-Minerals (MULTIVITAMIN WITH MINERALS) tablet Take 1 tablet by mouth daily.      . psyllium (METAMUCIL) 58.6 % powder Take 1 packet by mouth daily as needed (fiber).     [name] Kitchen SAVAYSA 60 MG TABS tablet  TAKE ONE TABLET BY MOUTH ONCE DAILY 30 tablet 11  . verapamil (CALAN-SR) 120 MG CR tablet Take 1 tablet (120 mg total) by mouth daily as needed. For heart palpitations 30 tablet 7   No current facility-administered medications for this visit.     Allergies  Allergen Reactions  . Propafenone Other (See Comments)    BP too low  . Codeine Other (See Comments)    REACTION: nausea/dizziness  . Codeine Phosphate Other (See Comments)    REACTION: nausea,dizzy  . Tramadol Hcl Other (See Comments)    REACTION: nausea--dizzy    Social History   Social History  . Marital status: Married    Spouse name: N/A  . Number of children: 1  . Years of education: N/A   Occupational History  .  Retired   Social History Main Topics  .  Smoking status: Former Smoker    Packs/day: 0.50    Years: 3.00    Types: Cigarettes    Quit date: 09/19/1966  . Smokeless tobacco: Never Used  . Alcohol use 1.8 oz/week    3 Glasses of wine per week     Comment: wine weekly  . Drug use: No  . Sexual activity: Not on file   Other Topics Concern  . Not on file   Social History Narrative   Work or School: none      Home Situation: lives with husband      Spiritual Beliefs: Christian      Lifestyle:no regular exercise; diet so so            Physical Exam: Vitals:   06/15/16 1133  BP: 110/80  Pulse: 71  Weight: 140 lb 3.2 oz (63.6 kg)  Height: 5' 2.5" (1.588 m)    GEN- The patient is well appearing, alert and oriented x 3 today.   Head- normocephalic, atraumatic Eyes-  Sclera clear, conjunctiva pink Ears- hearing intact Oropharynx- clear Neck- supple,  Lungs- Clear to ausculation bilaterally, normal work of breathing Heart- Regular rate and rhythm, no murmurs, rubs or gallops, PMI not laterally displaced GI- soft, NT, ND, + BS Extremities- no clubbing, cyanosis, or edema Neuro- strength and sensation are intact  EKG today reveals sinus rhythm   Assessment and Plan:  1. afib  Maintaining sinus rhythm off of AAD therapy post ablation chads2vasc score is at least 3.  She is on savaysa. We discussed ILR monitoring and cessation of [place] as an option.  For now, she would prefer to continue savaysa.  Return to see me in 1 year  [name] MD, [facility] 06/15/2016 11:59 AM

## 2016-08-02 DIAGNOSIS — L820 Inflamed seborrheic keratosis: Secondary | ICD-10-CM | POA: Diagnosis not present

## 2016-08-20 ENCOUNTER — Telehealth: Payer: Medicare Other | Admitting: Family

## 2016-08-20 DIAGNOSIS — J329 Chronic sinusitis, unspecified: Secondary | ICD-10-CM

## 2016-08-20 DIAGNOSIS — B9689 Other specified bacterial agents as the cause of diseases classified elsewhere: Secondary | ICD-10-CM

## 2016-08-20 MED ORDER — AMOXICILLIN-POT CLAVULANATE 875-125 MG PO TABS: 1.0000 | ORAL_TABLET | Freq: Two times a day (BID) | ORAL | 0 refills | Status: AC

## 2016-08-20 NOTE — Progress Notes (Signed)

## 2016-08-29 ENCOUNTER — Telehealth: Payer: Self-pay | Admitting: Family Medicine

## 2016-08-29 ENCOUNTER — Encounter: Payer: Self-pay | Admitting: Family Medicine

## 2016-08-29 ENCOUNTER — Ambulatory Visit (INDEPENDENT_AMBULATORY_CARE_PROVIDER_SITE_OTHER): Payer: Medicare Other | Admitting: Family Medicine

## 2016-08-29 VITALS — BP 156/92 | HR 100 | Temp 97.9°F | Resp 20 | Wt 142.0 lb

## 2016-08-29 DIAGNOSIS — E860 Dehydration: Secondary | ICD-10-CM

## 2016-08-29 DIAGNOSIS — R42 Dizziness and giddiness: Secondary | ICD-10-CM | POA: Diagnosis not present

## 2016-08-29 NOTE — Patient Instructions (Signed)
-   continue nasal saline.  - rest and hydration. You appeared mildly dehydrated. Make  Certain your urine is light yellow.  - you  can use plain mucinex if needed, but avoid o decongestants or anything with a "D". They can raise your BP, HR and cause dizziness.

## 2016-08-29 NOTE — Telephone Encounter (Signed)
Will see if pt visits ED

## 2016-08-29 NOTE — Progress Notes (Signed)
Marissa Hart , 03/25/1935, 81 y.o., female MRN: [id number] Patient Care Team    Relationship Specialty Notifications Start End  Lucretia Kern, DO PCP - General Family Medicine  03/31/14     CC: dizziness Subjective: Pt presents for an acute OV with complaints of dizziness of 1 day duration and elevated BP. Patient state his morning upon awakening, when she opened her eyes, she experienced dizziness/room spinning.  Associated symptoms include recent recent URI. She was treated with Augmentin through an E-visit and noticed a slight improvement. She endorses a continued nasal drainage, although improved. She is using the nasal saline, and taking mucinex-D. She denies fever, chills, vomit, diarrhea, headache, shortness of breath or wheezing.  Pt has tried an OTC "motion sickness medication" that did ease her symptoms. Dizziness has resolved. All of her prior symptoms started beginning of December with sore throat, nasal congestion after being exposed to strep throat through her grandson.  Pt reports her BP is usually normal and she has had more elevated BP since being ill.   Allergies  Allergen Reactions  . Propafenone Other (See Comments)    BP too low  . Codeine Other (See Comments)    REACTION: nausea/dizziness  . Codeine Phosphate Other (See Comments)    REACTION: nausea,dizzy  . Tramadol Hcl Other (See Comments)    REACTION: nausea--dizzy   Social History  Substance Use Topics  . Smoking status: Former Smoker    Packs/day: 0.50    Years: 3.00    Types: Cigarettes    Quit date: 09/19/1966  . Smokeless tobacco: Never Used  . Alcohol use 1.8 oz/week    3 Glasses of wine per week     Comment: wine weekly   Past Medical History:  Diagnosis Date  . Acquired renal cyst of right kidney   . Anxiety   . Atrial fibrillation and flutter ([place])    s/p PVI and CTI by Dr Rayann Heman 04/2013  . Blood transfusion   . Cataract   . CHEST WALL PAIN, HX OF 07/28/2007   Qualifier: Diagnosis of   By: Julien Girt CMA, [name]    . Diverticulosis of colon   . DJD (degenerative joint disease) 10/18/2011  . Erysipelas   . Fibromyalgia   . GERD (gastroesophageal reflux disease)   . Hiatal hernia   . Hot flashes - on low dose HRT with gynecologist, Dr. Nori Riis 03/31/2014  . Hx of colonic polyps   . Hyperlipidemia   . MITRAL VALVE PROLAPSE 07/28/2007   Qualifier: Diagnosis of  By: Julien Girt CMA, [name]    . Supraventricular tachycardia ([place])    AV nodal reentry  s/p RFCA 2001  . Thyroid disease    Past Surgical History:  Procedure Laterality Date  . ABDOMINAL HYSTERECTOMY    . ATRIAL FIBRILLATION ABLATION N/A 04/30/2013   Procedure: ATRIAL FIBRILLATION ABLATION;  Surgeon: [name], MD;  Location: [facility] CATH LAB;  Service: Cardiovascular;  Laterality: N/A;PVI and CTI by Dr Rayann Heman   . BREAST BIOPSY Left    benign  . BREAST BIOPSY  2011   benign  . CATARACT EXTRACTION BILATERAL W/ ANTERIOR VITRECTOMY    . COLONOSCOPY    . COLONOSCOPY    . ESOPHAGOGASTRODUODENOSCOPY    . KNEE ARTHROSCOPY  2012  . OOPHORECTOMY    . ROTATOR CUFF REPAIR  08/2001   Dr. Gladstone Lighter  . TEE WITHOUT CARDIOVERSION N/A 04/29/2013   Procedure: TRANSESOPHAGEAL ECHOCARDIOGRAM (TEE);  Surgeon: Jettie Booze, MD;  Location: [place];  Service: Cardiovascular;  Laterality: N/A;  . UPPER GASTROINTESTINAL ENDOSCOPY     History reviewed. No pertinent family history.   Medication List       Accurate as of 08/29/16  2:03 PM. Always use your most recent med list.          docusate sodium 100 MG capsule Commonly known as:  COLACE Take 100 mg by mouth every evening.   edoxaban 60 MG Tabs tablet Commonly known as:  SAVAYSA Take 60 mg by mouth daily.   estradiol 0.05 MG/24HR patch Commonly known as:  VIVELLE-DOT Place 1 patch onto the skin 2 (two) times a week.   levothyroxine 88 MCG tablet Commonly known as:  SYNTHROID, LEVOTHROID TAKE 88MCG BY MOUTH 2 DAYS PER WEEK, TAKE 75 MCG ON ALL OTHER DAYS     levothyroxine 75 MCG tablet Commonly known as:  SYNTHROID, LEVOTHROID Take 75 mcg by mouth 5 times a week. Monday, Wednesday, Thursday, Saturday and Sunday   multivitamin with minerals tablet Take 1 tablet by mouth daily.   psyllium 58.6 % powder Commonly known as:  METAMUCIL Take 1 packet by mouth daily as needed (fiber).   verapamil 120 MG CR tablet Commonly known as:  CALAN-SR Take 1 tablet (120 mg total) by mouth daily as needed. For heart palpitations   VITAMIN B COMPLEX PO Take 1,000 mLs by mouth daily.   Vitamin D 1000 units capsule Take 1,000 Units by mouth daily.       No results found for this or any previous visit (from the past 24 hour(s)). No results found.   ROS: Negative, with the exception of above mentioned in HPI   Objective:  BP (!) 156/92 (BP Location: Left Arm, Patient Position: Sitting, Cuff Size: Normal)   Pulse 100   Temp 97.9 F (36.6 C)   Resp 20   Wt 142 lb (64.4 kg)   SpO2 99%   BMI 25.56 kg/m  Body mass index is 25.56 kg/m. Gen: Afebrile. No acute distress. Nontoxic in appearance, well developed, well nourished.  HENT: AT. [place]. Bilateral TM visualized WNL. Moderately dry mucous membranes, no oral lesions. Bilateral nares within normal limits. Throat without erythema or exudates. No cough or hoarseness on exam.  Eyes:Pupils Equal Round Reactive to light, Extraocular movements intact,  Conjunctiva without redness, discharge or icterus. Neck/lymp/endocrine: Supple, no lymphadenopathy CV: RRR , no murmur, no edema.  Chest: CTAB, no wheeze or crackles. Good air movement, normal resp effort.  Neuro: Normal gait. PERLA. EOMi. Alert. Oriented x3  Psych: Normal affect, dress and demeanor. Normal speech. Normal thought content and judgment.  Assessment/Plan: Marissa Hart is a 81 y.o. female present for acute OV for  Mild dehydration/Dizziness - rest and hydration.  - continue nasal saline.  - can use plain mucinex if needed, but avoid  use  of decongestants. Explained decongestants can cause elevated BP, HR and dizziness.  - F/U PRN  electronically signed by:  Howard Pouch, DO  [place]

## 2016-08-29 NOTE — Telephone Encounter (Signed)
Patient Name: Marissa Hart DOB: 24-May-1935 Initial Comment Caller states c/o yellow nasal congestion, nausea and dizziness. Nurse Assessment Nurse: Vallery Sa, RN, [place] Date/Time (Eastern Time): 08/29/2016 12:06:27 PM Confirm and document reason for call. If symptomatic, describe symptoms. ---Caller states she completed Amoxicillin for a sinus infection 2 days ago. She developed vertigo with nausea this morning. No severe breathing or swallowing difficulty. No fever. Alert and responsive. No injury in the past 3 days. No drainage from her ears. Does the patient have any new or worsening symptoms? ---Yes Will a triage be completed? ---Yes Related visit to physician within the last 2 weeks? ---Yes Does the PT have any chronic conditions? (i.e. diabetes, asthma, etc.) ---Yes List chronic conditions. ---A-Fib, Thyroid problems, Recent sinus infection, Arthritis Is this a behavioral health or substance abuse call? ---No Guidelines Guideline Title Affirmed Question Affirmed Notes Dizziness - Vertigo [1] Dizziness (vertigo) present now AND [2] one or more stroke risk factors (i.e., hypertension, diabetes, prior stroke/TIA/ heart attack) (Exception: prior physician evaluation for this AND no different/worse than usual) Final Disposition User Go to ED Now (or PCP triage) Vallery Sa, RN, [place] Referrals [facility] - ED Disagree/Comply: Comply BP 151/88

## 2016-08-29 NOTE — Telephone Encounter (Signed)
Pt seen today at [facility], DO office.

## 2016-09-05 ENCOUNTER — Encounter: Payer: Self-pay | Admitting: Family Medicine

## 2016-09-05 NOTE — Telephone Encounter (Signed)
I called the pt and scheduled an appt for tomorrow at 1:15pm.

## 2016-09-06 ENCOUNTER — Ambulatory Visit (INDEPENDENT_AMBULATORY_CARE_PROVIDER_SITE_OTHER): Payer: Medicare Other | Admitting: Family Medicine

## 2016-09-06 ENCOUNTER — Encounter: Payer: Self-pay | Admitting: Family Medicine

## 2016-09-06 VITALS — BP 120/82 | HR 85 | Temp 98.8°F | Ht 62.5 in | Wt 143.0 lb

## 2016-09-06 DIAGNOSIS — J0100 Acute maxillary sinusitis, unspecified: Secondary | ICD-10-CM

## 2016-09-06 DIAGNOSIS — H6122 Impacted cerumen, left ear: Secondary | ICD-10-CM

## 2016-09-06 MED ORDER — BENZONATATE 100 MG PO CAPS: 100.0000 mg | ORAL_CAPSULE | Freq: Three times a day (TID) | ORAL | 0 refills | Status: DC | PRN

## 2016-09-06 MED ORDER — AMOXICILLIN-POT CLAVULANATE 875-125 MG PO TABS: 1.0000 | ORAL_TABLET | Freq: Two times a day (BID) | ORAL | 0 refills | Status: DC

## 2016-09-06 NOTE — Progress Notes (Signed)
HPI:  Acute visit for sinus congestion and left ear discomfort: -started: 3 weeks ago -symptoms:nasal congestion, sore throat, cough and low-grade fevers initially. She did and the visit about 4 days into the visit and reports they gave her amoxicillin for 6 days and Mucinex DM. DM her blood pressure go up and she had a visit with another provider for this. This resolved with stopping DM Mucinex. Her symptoms did not really improve. She now has sinus congestion, thick postnasal drip that is white and yellow, a cough, sinus pain and pressure and left ear pain. -denies:fever, nausea, vomiting, diarrhea, hearing loss SOB, NVD, tooth pain -sick contacts/travel/risks: no reported flu, strep or tick exposure  ROS: See pertinent positives and negatives per HPI.  Past Medical History:  Diagnosis Date  . Acquired renal cyst of right kidney   . Anxiety   . Atrial fibrillation and flutter ([place])    s/p PVI and CTI by Dr Rayann Heman 04/2013  . Blood transfusion   . Cataract   . CHEST WALL PAIN, HX OF 07/28/2007   Qualifier: Diagnosis of  By: Julien Girt CMA, [name]    . Diverticulosis of colon   . DJD (degenerative joint disease) 10/18/2011  . Erysipelas   . Fibromyalgia   . GERD (gastroesophageal reflux disease)   . Hiatal hernia   . Hot flashes - on low dose HRT with gynecologist, Dr. Nori Riis 03/31/2014  . Hx of colonic polyps   . Hyperlipidemia   . MITRAL VALVE PROLAPSE 07/28/2007   Qualifier: Diagnosis of  By: Julien Girt CMA, [name]    . Supraventricular tachycardia ([place])    AV nodal reentry  s/p RFCA 2001  . Thyroid disease     Past Surgical History:  Procedure Laterality Date  . ABDOMINAL HYSTERECTOMY    . ATRIAL FIBRILLATION ABLATION N/A 04/30/2013   Procedure: ATRIAL FIBRILLATION ABLATION;  Surgeon: [name], MD;  Location: [facility] CATH LAB;  Service: Cardiovascular;  Laterality: N/A;PVI and CTI by Dr Rayann Heman   . BREAST BIOPSY Left    benign  . BREAST BIOPSY  2011   benign  . CATARACT EXTRACTION  BILATERAL W/ ANTERIOR VITRECTOMY    . COLONOSCOPY    . COLONOSCOPY    . ESOPHAGOGASTRODUODENOSCOPY    . KNEE ARTHROSCOPY  2012  . OOPHORECTOMY    . ROTATOR CUFF REPAIR  08/2001   Dr. Gladstone Lighter  . TEE WITHOUT CARDIOVERSION N/A 04/29/2013   Procedure: TRANSESOPHAGEAL ECHOCARDIOGRAM (TEE);  Surgeon: Jettie Booze, MD;  Location: [place];  Service: Cardiovascular;  Laterality: N/A;  . UPPER GASTROINTESTINAL ENDOSCOPY      No family history on file.  Social History   Social History  . Marital status: Married    Spouse name: N/A  . Number of children: 1  . Years of education: N/A   Occupational History  .  Retired   Social History Main Topics  . Smoking status: Former Smoker    Packs/day: 0.50    Years: 3.00    Types: Cigarettes    Quit date: 09/19/1966  . Smokeless tobacco: Never Used  . Alcohol use 1.8 oz/week    3 Glasses of wine per week     Comment: wine weekly  . Drug use: No  . Sexual activity: Not Asked   Other Topics Concern  . None   Social History Narrative   Work or School: none      Home Situation: lives with husband      Spiritual Beliefs: Darrick Meigs  Lifestyle:no regular exercise; diet so so              Current Outpatient Prescriptions:  .  B Complex Vitamins (VITAMIN B COMPLEX PO), Take 1,000 mLs by mouth daily., Disp: , Rfl:  .  Cholecalciferol (VITAMIN D) 1000 UNITS capsule, Take 1,000 Units by mouth daily.  , Disp: , Rfl:  .  docusate sodium (COLACE) 100 MG capsule, Take 100 mg by mouth every evening. , Disp: , Rfl:  .  edoxaban (SAVAYSA) 60 MG TABS tablet, Take 60 mg by mouth daily., Disp: 30 tablet, Rfl: 11 .  estradiol (VIVELLE-DOT) 0.05 MG/24HR, Place 1 patch onto the skin 2 (two) times a week.  , Disp: , Rfl:  .  levothyroxine (SYNTHROID, LEVOTHROID) 75 MCG tablet, Take 75 mcg by mouth 5 times a week. Monday, Wednesday, Thursday, Saturday and Sunday, Disp: , Rfl:  .  levothyroxine (SYNTHROID, LEVOTHROID) 88 MCG tablet, TAKE  88MCG BY MOUTH 2 DAYS PER WEEK, TAKE 75 MCG ON ALL OTHER DAYS, Disp: 90 tablet, Rfl: 1 .  Multiple Vitamins-Minerals (MULTIVITAMIN WITH MINERALS) tablet, Take 1 tablet by mouth daily.  , Disp: , Rfl:  .  psyllium (METAMUCIL) 58.6 % powder, Take 1 packet by mouth daily as needed (fiber). , Disp: , Rfl:  .  verapamil (CALAN-SR) 120 MG CR tablet, Take 1 tablet (120 mg total) by mouth daily as needed. For heart palpitations, Disp: 30 tablet, Rfl: 7 .  amoxicillin-clavulanate (AUGMENTIN) 875-125 MG tablet, Take 1 tablet by mouth 2 (two) times daily., Disp: 20 tablet, Rfl: 0 .  benzonatate (TESSALON PERLES) 100 MG capsule, Take 1 capsule (100 mg total) by mouth 3 (three) times daily as needed., Disp: 20 capsule, Rfl: 0  EXAM:  Vitals:   09/06/16 1307  BP: 120/82  Pulse: 85  Temp: 98.8 F (37.1 C)    Body mass index is 25.74 kg/m.  GENERAL: vitals reviewed and listed above, alert, oriented, appears well hydrated and in no acute distress  atraumatic, conjunttiva clear, no obvious abnormalities on inspection of external nose and ears, normal appearance of ear canals and TMs except for in the left ear canal there is nonobstructing earwax, the portion of the TM that is visualized appears normal, thick white nasal congestion, mild post oropharyngeal erythema with thick white PND, no tonsillar edema or exudate, no sinus TTP  NECK: no obvious masses on inspection  LUNGS: clear to auscultation bilaterally, no wheezes, rales or rhonchi, good air movement  CV: HRRR, no peripheral edema  MS: moves all extremities without noticeable abnormality  PSYCH: pleasant and cooperative, no obvious depression or anxiety  ASSESSMENT AND PLAN:  Discussed the following assessment and plan:  Acute non-recurrent maxillary sinusitis - Plan: amoxicillin-clavulanate (AUGMENTIN) 875-125 MG tablet, benzonatate (TESSALON PERLES) 100 MG capsule  Impacted cerumen of left ear  -This probably started out as a viral  upper respiratory infection, treated to seen with a short course of an antibiotic, which therefore did not help -At this point, however it appears she has developed a sinus infection and we have decided to treat this with Augmentin -She wanted to try removal of the wax in her left ear canal with a soft curet, however she has a strong cough reflex whenever we even gently attempt to remove the wax, so this procedure was not done. She will try over-the-counter ear drops instead. -She is quite distressed with the duration of her symptoms, and we will have her follow up for recheck in about 7-10  days to ensure she is improving. -of course, we advised to return or notify a doctor immediately if symptoms worsen or persist or new concerns arise.   Patient Instructions  BEFORE YOU LEAVE: -follow up: 7-10 days for recheck  Take the Augmentin as prescribed. Also consider taking culturelle or align probiotic.  Use the tessalon for cough as needed.  Cool mist humidifier at night. Follow all cleaning instructions.  Nasal saline twice daily.  Over the counter ear wax cleaning drops per instructions for wax in L ear.  Follow up sooner if worsening or new concerns.    Colin Benton R., DO

## 2016-09-06 NOTE — Progress Notes (Signed)
Pre visit review using our clinic review tool, if applicable. No additional management support is needed unless otherwise documented below in the visit note. 

## 2016-09-06 NOTE — Patient Instructions (Signed)
BEFORE YOU LEAVE: -follow up: 7-10 days for recheck  Take the Augmentin as prescribed. Also consider taking culturelle or align probiotic.  Use the tessalon for cough as needed.  Cool mist humidifier at night. Follow all cleaning instructions.  Nasal saline twice daily.  Over the counter ear wax cleaning drops per instructions for wax in L ear.  Follow up sooner if worsening or new concerns.

## 2016-09-27 ENCOUNTER — Encounter: Payer: Self-pay | Admitting: Family Medicine

## 2016-09-27 ENCOUNTER — Encounter: Payer: Medicare Other | Admitting: Family Medicine

## 2016-09-27 ENCOUNTER — Ambulatory Visit (INDEPENDENT_AMBULATORY_CARE_PROVIDER_SITE_OTHER): Payer: Medicare Other | Admitting: Family Medicine

## 2016-09-27 VITALS — BP 100/58 | HR 75 | Temp 97.7°F | Ht 62.25 in | Wt 143.9 lb

## 2016-09-27 DIAGNOSIS — F3342 Major depressive disorder, recurrent, in full remission: Secondary | ICD-10-CM | POA: Diagnosis not present

## 2016-09-27 DIAGNOSIS — I480 Paroxysmal atrial fibrillation: Secondary | ICD-10-CM | POA: Diagnosis not present

## 2016-09-27 DIAGNOSIS — E039 Hypothyroidism, unspecified: Secondary | ICD-10-CM | POA: Diagnosis not present

## 2016-09-27 DIAGNOSIS — Z0189 Encounter for other specified special examinations: Secondary | ICD-10-CM | POA: Diagnosis not present

## 2016-09-27 DIAGNOSIS — R232 Flushing: Secondary | ICD-10-CM

## 2016-09-27 DIAGNOSIS — Z0000 Encounter for general adult medical examination without abnormal findings: Secondary | ICD-10-CM | POA: Diagnosis not present

## 2016-09-27 DIAGNOSIS — E785 Hyperlipidemia, unspecified: Secondary | ICD-10-CM

## 2016-09-27 LAB — BASIC METABOLIC PANEL
BUN: 13 mg/dL (ref 6–23)
CHLORIDE: 101 meq/L (ref 96–112)
CO2: 28 meq/L (ref 19–32)
CREATININE: 0.77 mg/dL (ref 0.40–1.20)
Calcium: 9.1 mg/dL (ref 8.4–10.5)
GFR: 76.7 mL/min (ref >60.00)
GLUCOSE: 80 mg/dL (ref 70–99)
Potassium: 3.5 mEq/L (ref 3.5–5.1)
Sodium: 137 mEq/L (ref 135–145)

## 2016-09-27 LAB — CBC
HEMATOCRIT: 39.3 % (ref 36.0–46.0)
Hemoglobin: 13.4 g/dL (ref 12.0–15.0)
MCHC: 34.2 g/dL (ref 30.0–36.0)
MCV: 95.5 fl (ref 78.0–100.0)
PLATELETS: 218 10*3/uL (ref 150.0–400.0)
RBC: 4.11 Mil/uL (ref 3.87–5.11)
RDW: 13.6 % (ref 11.5–15.5)
WBC: 8 10*3/uL (ref 4.0–10.5)

## 2016-09-27 LAB — LIPID PANEL
CHOLESTEROL: 209 mg/dL — AB (ref 0–200)
HDL: 40.7 mg/dL (ref >39.00)
LDL CALC: 138 mg/dL — AB (ref 0–99)
NonHDL: 168.05
TRIGLYCERIDES: 151 mg/dL — AB (ref 0.0–149.0)
Total CHOL/HDL Ratio: 5
VLDL: 30.2 mg/dL (ref 0.0–40.0)

## 2016-09-27 LAB — TSH: TSH: 4.38 u[IU]/mL (ref 0.35–4.50)

## 2016-09-27 NOTE — Patient Instructions (Signed)
BEFORE YOU LEAVE: -advanced directives with RN -labs -follow up: I 6 months  Get your repeat breast study in [place] as planned.  We have ordered labs or studies at this visit. It can take up to 1-2 weeks for results and processing. IF results require follow up or explanation, we will call you with instructions. Clinically stable results will be released to your [facility]. If you have not heard from [place] or cannot find your results in [facility] in 2 weeks please contact our office at [phone].  If you are not yet signed up for [facility], please consider signing up.  We recommend the following healthy lifestyle for LIFE: 1) Small portions.   Tip: eat off of a salad plate instead of a dinner plate.  Tip: if you need more or a snack choose fruits, veggies and/or a handful of nuts or seeds.  2) Eat a healthy clean diet.  * Tip: Avoid (less then 1 serving per week): processed foods, sweets, sweetened drinks, white starches (rice, flour, bread, potatoes, pasta, etc), red meat, fast foods, butter  *Tip: CHOOSE instead   * 5-9 servings per day of fresh or frozen fruits and vegetables (but not corn, potatoes, bananas, canned or dried fruit)   *nuts and seeds, beans   *olives and olive oil   *small portions of lean meats such as fish and white chicken    *small portions of whole grains  3)Get at least 150 minutes of sweaty aerobic exercise per week.  4)Reduce stress - consider counseling, meditation and relaxation to balance other aspects of your life.

## 2016-09-27 NOTE — Progress Notes (Signed)
Pre visit review using our clinic review tool, if applicable. No additional management support is needed unless otherwise documented below in the visit note. 

## 2016-09-27 NOTE — Progress Notes (Signed)
Medicare Annual Preventive Care Visit  (initial annual wellness or annual wellness exam)  Concerns and/or follow up today:  Marissa Hart is a very pleasant 82 yo with a PMH significant for Hypothyroidism, Hot flashes, Dyslipidemia, Depression and PAF here for her annual wellness visit. Reports doing well now. Mood good now - feels is "turning the corner with grieving". Rare palpitations - takes the verapamil for this. Denies CP, SOB, DOE, swelling, depression. Ran out out of 66mcg of levothyroxine and now taking 34mcg daily and has felt fine. Due for labs, mammo in April.  Hypothyroidism: -on levothyroxine 59mcg daily for a long time, changed to generic in 09/2013, now on 88 twice per week and 70mcg the other days  Hot Flashes: -on vivelle dot -sees gynecologist for this, on very low dose of transdermal estrogen, s/p hysterectomy  Dyslipidemia: -low hdl  PAF, atrial flutter, PACs: -followed by cardiology/electrophysiology  -s/p ablation in 2014 -on edoxaban, takes verapamil as needed for palpitations - did take some last night; sometimes does make her BP drop a bit  Depressed Mood/Bereavment: -husband passed - grieving but doing well -anniversaries and holidays are tough -she is in a widows group at church and this has been very helpful, feels is coping well  ROS: negative for report of fevers, unintentional weight loss, vision changes, vision loss, hearing loss or change, chest pain, sob, hemoptysis, melena, hematochezia, hematuria, genital discharge or lesions, falls, bleeding or bruising, loc, thoughts of suicide or self harm, memory loss  1.) Patient-completed health risk assessment  - completed and reviewed, see scanned documentation  2.) Review of Medical History: -PMH, PSH, Family History and current specialty and care providers reviewed and updated and listed below  - see scanned in document in chart and below  Past Medical History:  Diagnosis Date  . Acquired  renal cyst of right kidney   . Anxiety   . Atrial fibrillation and flutter ([place])    s/p PVI and CTI by Dr Rayann Heman 04/2013  . Blood transfusion   . Cataract   . CHEST WALL PAIN, HX OF 07/28/2007   Qualifier: Diagnosis of  By: Julien Girt CMA, [name]    . Diverticulosis of colon   . DJD (degenerative joint disease) 10/18/2011  . Erysipelas   . Fibromyalgia   . GERD (gastroesophageal reflux disease)   . Hiatal hernia   . Hot flashes - on low dose HRT with gynecologist, Dr. Nori Riis 03/31/2014  . Hx of colonic polyps   . Hyperlipidemia   . MITRAL VALVE PROLAPSE 07/28/2007   Qualifier: Diagnosis of  By: Julien Girt CMA, [name]    . Supraventricular tachycardia ([place])    AV nodal reentry  s/p RFCA 2001  . Thyroid disease     Past Surgical History:  Procedure Laterality Date  . ABDOMINAL HYSTERECTOMY    . ATRIAL FIBRILLATION ABLATION N/A 04/30/2013   Procedure: ATRIAL FIBRILLATION ABLATION;  Surgeon: [name], MD;  [facility] LAB;  Service: Cardiovascular;  Laterality: N/A;PVI and CTI by Dr Rayann Heman   . BREAST BIOPSY Left    benign  . BREAST BIOPSY  2011   benign  . CATARACT EXTRACTION BILATERAL W/ ANTERIOR VITRECTOMY    . COLONOSCOPY    . COLONOSCOPY    . ESOPHAGOGASTRODUODENOSCOPY    . KNEE ARTHROSCOPY  2012  . OOPHORECTOMY    . ROTATOR CUFF REPAIR  08/2001   Dr. Gladstone Lighter  . TEE WITHOUT CARDIOVERSION N/A 04/29/2013   Procedure: TRANSESOPHAGEAL ECHOCARDIOGRAM (TEE);  Surgeon: Charlann Lange.  Irish Lack, MD;  Location: [place];  Service: Cardiovascular;  Laterality: N/A;  . UPPER GASTROINTESTINAL ENDOSCOPY      Social History   Social History  . Marital status: Married    Spouse name: N/A  . Number of children: 1  . Years of education: N/A   Occupational History  .  Retired   Social History Main Topics  . Smoking status: Former Smoker    Packs/day: 0.50    Years: 3.00    Types: Cigarettes    Quit date: 09/19/1966  . Smokeless tobacco: Never Used  . Alcohol use 1.8 oz/week    3  Glasses of wine per week     Comment: wine weekly  . Drug use: No  . Sexual activity: Not on file   Other Topics Concern  . Not on file   Social History Narrative   Work or School: none      Home Situation: lives with husband      Spiritual Beliefs: Christian      Lifestyle:no regular exercise; diet so so             No family history on file.  Current Outpatient Prescriptions on File Prior to Visit  Medication Sig Dispense Refill  . B Complex Vitamins (VITAMIN B COMPLEX PO) Take 1,000 mLs by mouth daily.    . Cholecalciferol (VITAMIN D) 1000 UNITS capsule Take 1,000 Units by mouth daily.      [name] Kitchen docusate sodium (COLACE) 100 MG capsule Take 100 mg by mouth every evening.     . edoxaban (SAVAYSA) 60 MG TABS tablet Take 60 mg by mouth daily. 30 tablet 11  . estradiol (VIVELLE-DOT) 0.05 MG/24HR Place 1 patch onto the skin 2 (two) times a week.      . levothyroxine (SYNTHROID, LEVOTHROID) 88 MCG tablet TAKE 88MCG BY MOUTH 2 DAYS PER WEEK, TAKE 75 MCG ON ALL OTHER DAYS 90 tablet 1  . Multiple Vitamins-Minerals (MULTIVITAMIN WITH MINERALS) tablet Take 1 tablet by mouth daily.      . psyllium (METAMUCIL) 58.6 % powder Take 1 packet by mouth daily as needed (fiber).     . verapamil (CALAN-SR) 120 MG CR tablet Take 1 tablet (120 mg total) by mouth daily as needed. For heart palpitations 30 tablet 7   No current facility-administered medications on file prior to visit.      3.) Review of functional ability and level of safety:  Any difficulty hearing?  See scanned documentation  History of falling?  See scanned documentation  Any trouble with IADLs - using a phone, using transportation, grocery shopping, preparing meals, doing housework, doing laundry, taking medications and managing money?  See scanned documentation  Advance Directives?  Discussed briefly and offered more resources and detailed discussion with our trained staff.   See summary of recommendations in Patient  Instructions below.  4.) Physical Exam Vitals:   09/27/16 0828  BP: (!) 100/58  Pulse: 75  Temp: 97.7 F (36.5 C)   Estimated body mass index is 26.11 kg/m as calculated from the following:   Height as of this encounter: 5' 2.25" (1.581 m).   Weight as of this encounter: 143 lb 14.4 oz (65.3 kg).  EKG (optional): deferred  GENERAL: vitals reviewed and listed below, alert, oriented, appears well hydrated and in no acute distress  HEENT: head atraumatic, PERRLA, visual acuity, normal appearance of eyes, ears, nose and mouth. moist mucus membranes.  NECK: supple, no masses or lymphadenopathy  LUNGS: clear to  auscultation bilaterally, no rales, rhonchi or wheeze  CV: HRRR, no peripheral edema or cyanosis, normal pedal pulses  ABDOMEN: bowel sounds normal, soft, non tender to palpation, no masses, no rebound or guarding  SKIN: no rash or abnormal lesions, declined full skin exam as does this with her dermatologist  MS: normal gait, moves all extremities normally  BREAST: declined  GU: declined  RECTAL: deferred  NEURO: normal gait, speech and thought processing grossly intact, muscle tone grossly intact throughout  PSYCH: normal affect, pleasant and cooperative  Cognitive function grossly intact  See patient instructions for recommendations.  Education and counseling regarding the above review of health provided with a plan for the following: -see scanned patient completed form for further details -fall prevention strategies discussed  -healthy lifestyle discussed -importance and resources for completing advanced directives discussed -see patient instructions below for any other recommendations provided  4)The following written screening schedule of preventive measures were reviewed with assessment and plan made per below, orders and patient instructions:      AAA screening done if applicable     Alcohol screening done     Obesity Screening and counseling done      STI screening (Hep C if born 1943-65) offered and per pt wishes     Tobacco Screening done done       Pneumococcal (PPSV23 -one dose after 64, one before if risk factors), influenza yearly and hepatitis B vaccines (if high risk - end stage renal disease, IV drugs, homosexual men, live in home for mentally retarded, hemophilia receiving factors) ASSESSMENT/PLAN: done if applicable      Screening mammograph (yearly if >40) ASSESSMENT/PLAN: 04/2016, has repeat in April due to cyst - goes to [facility]      Screening Pap smear/pelvic exam (q2 years) ASSESSMENT/PLAN: n/a, declined      Colorectal cancer screening (FOBT yearly or flex sig q4y or colonoscopy q10y or barium enema q4y) ASSESSMENT/PLAN: completed      Diabetes outpatient self-management training services ASSESSMENT/PLAN: utd or done      Bone mass measurements(covered q2y if indicated - estrogen def, osteoporosis, hyperparathyroid, vertebral abnormalities, osteoporosis or steroids) ASSESSMENT/PLAN: utd or discussed and ordered per pt wishes - patient refused further bone density tests - she reports she will not take medication so does not want to do the DEXA.      Screening for glaucoma(q1y if high risk - diabetes, FH, AA and > 50 or hispanic and > 65) ASSESSMENT/PLAN: utd or advised; sees eye doctor every 2 years      Medical nutritional therapy for individuals with diabetes or renal disease ASSESSMENT/PLAN: see orders      Cardiovascular screening blood tests (lipids q5y) ASSESSMENT/PLAN: see orders and labs      Diabetes screening tests ASSESSMENT/PLAN: see orders and labs   7.) Summary:   Medicare annual wellness visit, subsequent -risk factors and conditions per above assessment were discussed and treatment, recommendations and referrals were offered per documentation above and orders and patient instructions. -RN to discuss advanced directives further with pt -mammo follow up planned -labs -lifestyle  recs  Hypothyroidism, unspecified type - Plan: TSH -she prefers levels to be less then 2 and she has started taking 76mcg daily instead of alt with 75 -will check levels today  Paroxysmal atrial fibrillation ([place]) - Plan: Basic metabolic panel, CBC (no diff) -stable -BP a little low today from taking verapamil, may consider changing dose with her cardiologist - no falls or severe dizziness or symptoms from the  low BP  Hot flashes - on low dose HRT with gynecologist, Dr. Nori Riis -stable  Recurrent major depressive disorder, in full remission ([place]) -improved  Dyslipidemia - Plan: Lipid Panel  Patient Instructions  BEFORE YOU LEAVE: -advanced directives with RN -labs -follow up: I 6 months  Get your repeat breast study in [place] as planned.  We have ordered labs or studies at this visit. It can take up to 1-2 weeks for results and processing. IF results require follow up or explanation, we will call you with instructions. Clinically stable results will be released to your [facility]. If you have not heard from [place] or cannot find your results in [facility] in 2 weeks please contact our office at [phone].  If you are not yet signed up for [facility], please consider signing up.  We recommend the following healthy lifestyle for LIFE: 1) Small portions.   Tip: eat off of a salad plate instead of a dinner plate.  Tip: if you need more or a snack choose fruits, veggies and/or a handful of nuts or seeds.  2) Eat a healthy clean diet.  * Tip: Avoid (less then 1 serving per week): processed foods, sweets, sweetened drinks, white starches (rice, flour, bread, potatoes, pasta, etc), red meat, fast foods, butter  *Tip: CHOOSE instead   * 5-9 servings per day of fresh or frozen fruits and vegetables (but not corn, potatoes, bananas, canned or dried fruit)   *nuts and seeds, beans   *olives and olive oil   *small portions of lean meats such as fish and white chicken    *small portions of whole  grains  3)Get at least 150 minutes of sweaty aerobic exercise per week.  4)Reduce stress - consider counseling, meditation and relaxation to balance other aspects of your life.     Colin Benton R., DO

## 2016-09-27 NOTE — Progress Notes (Signed)
Spent 18 minutes with patient discussing the following about Advance Directives:  -Difference between full code, limited code, and DNR -Significance of [place] vs. Palliative Care -DNR and MOST form discussed   Patient and self were presented for appointment. Patient was alert and oriented x4. No forms were completed for appointment. Patient plans to make appointment with PCP to have form completed.    Lucina Mellow, RN

## 2016-09-28 ENCOUNTER — Other Ambulatory Visit: Payer: Self-pay | Admitting: Family Medicine

## 2016-09-28 MED ORDER — LEVOTHYROXINE SODIUM 88 MCG PO TABS
88.0000 ug | ORAL_TABLET | Freq: Every day | ORAL | 1 refills | Status: DC
Start: 2016-09-28 — End: 2017-05-30

## 2016-09-30 ENCOUNTER — Ambulatory Visit: Payer: Medicare Other | Admitting: Family Medicine

## 2016-10-13 DIAGNOSIS — H04123 Dry eye syndrome of bilateral lacrimal glands: Secondary | ICD-10-CM | POA: Diagnosis not present

## 2016-10-13 DIAGNOSIS — H11823 Conjunctivochalasis, bilateral: Secondary | ICD-10-CM | POA: Diagnosis not present

## 2016-10-13 DIAGNOSIS — Z961 Presence of intraocular lens: Secondary | ICD-10-CM | POA: Diagnosis not present

## 2016-10-13 DIAGNOSIS — H02834 Dermatochalasis of left upper eyelid: Secondary | ICD-10-CM | POA: Diagnosis not present

## 2016-10-13 DIAGNOSIS — H02831 Dermatochalasis of right upper eyelid: Secondary | ICD-10-CM | POA: Diagnosis not present

## 2016-11-08 DIAGNOSIS — M7062 Trochanteric bursitis, left hip: Secondary | ICD-10-CM | POA: Diagnosis not present

## 2016-11-08 DIAGNOSIS — M25511 Pain in right shoulder: Secondary | ICD-10-CM | POA: Diagnosis not present

## 2016-11-09 ENCOUNTER — Other Ambulatory Visit: Payer: Self-pay | Admitting: Internal Medicine

## 2016-11-17 ENCOUNTER — Telehealth: Payer: Self-pay

## 2016-11-17 NOTE — Telephone Encounter (Signed)
Patient called office with c/o nodule in left side of her neck, she thinks very near or a part of her thyroid gland. She had not been too concerned, however her dentist also felt the nodule and recommended she have it checked. She denies any pain/problems with swallowing or breathing. She denies and issues when laying on her side. She was not sure if she could wait until April when her follow up is scheduled or if you would want to see her sooner.   Dr. Maudie Mercury - Please advise. Thanks!

## 2016-11-17 NOTE — Telephone Encounter (Signed)
Please help her to schedule an appointment, we could see her tomorrow. Thanks.

## 2016-11-18 NOTE — Telephone Encounter (Signed)
Spoke with pt and she scheduled appt for 11/21/16. Nothing further needed.

## 2016-11-21 ENCOUNTER — Encounter: Payer: Self-pay | Admitting: Family Medicine

## 2016-11-21 ENCOUNTER — Ambulatory Visit (INDEPENDENT_AMBULATORY_CARE_PROVIDER_SITE_OTHER): Payer: Medicare Other | Admitting: Family Medicine

## 2016-11-21 VITALS — BP 130/78 | HR 84 | Temp 97.8°F | Ht 62.25 in | Wt 142.0 lb

## 2016-11-21 DIAGNOSIS — R221 Localized swelling, mass and lump, neck: Secondary | ICD-10-CM | POA: Diagnosis not present

## 2016-11-21 DIAGNOSIS — R1310 Dysphagia, unspecified: Secondary | ICD-10-CM | POA: Diagnosis not present

## 2016-11-21 DIAGNOSIS — E039 Hypothyroidism, unspecified: Secondary | ICD-10-CM | POA: Diagnosis not present

## 2016-11-21 LAB — T4, FREE: Free T4: 1.19 ng/dL (ref 0.60–1.60)

## 2016-11-21 LAB — TSH: TSH: 0.71 u[IU]/mL (ref 0.35–4.50)

## 2016-11-21 NOTE — Progress Notes (Signed)
HPI:  Acute visit for several issues. First of all she reports when she puts her neck lotion on in the morning she has noticed a lump over her L thyroid for several months. 2nd, she feels her hypothyroidism is undertreated. She felt better when was over treated. Report fatigue, wt gain dry skin and brittle hair when thyroid level above 2. She wants to recheck today and adjust levothyroxine. She also occ feels like things get caught in her upper throat. She reports some difficulty swallowing pills and solids intermittently for a long time.  ROS: See pertinent positives and negatives per HPI.  Past Medical History:  Diagnosis Date  . Acquired renal cyst of right kidney   . Anxiety   . Atrial fibrillation and flutter ([place])    s/p PVI and CTI by Dr Rayann Heman 04/2013  . Blood transfusion   . Cataract   . CHEST WALL PAIN, HX OF 07/28/2007   Qualifier: Diagnosis of  By: Julien Girt CMA, [name]    . Diverticulosis of colon   . DJD (degenerative joint disease) 10/18/2011  . Erysipelas   . Fibromyalgia   . GERD (gastroesophageal reflux disease)   . Hiatal hernia   . Hot flashes - on low dose HRT with gynecologist, Dr. Nori Riis 03/31/2014  . Hx of colonic polyps   . Hyperlipidemia   . MITRAL VALVE PROLAPSE 07/28/2007   Qualifier: Diagnosis of  By: Julien Girt CMA, [name]    . Supraventricular tachycardia ([place])    AV nodal reentry  s/p RFCA 2001  . Thyroid disease     Past Surgical History:  Procedure Laterality Date  . ABDOMINAL HYSTERECTOMY    . ATRIAL FIBRILLATION ABLATION N/A 04/30/2013   Procedure: ATRIAL FIBRILLATION ABLATION;  Surgeon: [name], MD;  Location: [facility] CATH LAB;  Service: Cardiovascular;  Laterality: N/A;PVI and CTI by Dr Rayann Heman   . BREAST BIOPSY Left    benign  . BREAST BIOPSY  2011   benign  . CATARACT EXTRACTION BILATERAL W/ ANTERIOR VITRECTOMY    . COLONOSCOPY    . COLONOSCOPY    . ESOPHAGOGASTRODUODENOSCOPY    . KNEE ARTHROSCOPY  2012  . OOPHORECTOMY    . ROTATOR CUFF REPAIR   08/2001   Dr. Gladstone Lighter  . TEE WITHOUT CARDIOVERSION N/A 04/29/2013   Procedure: TRANSESOPHAGEAL ECHOCARDIOGRAM (TEE);  Surgeon: Jettie Booze, MD;  Location: [place];  Service: Cardiovascular;  Laterality: N/A;  . UPPER GASTROINTESTINAL ENDOSCOPY      No family history on file.  Social History   Social History  . Marital status: Married    Spouse name: N/A  . Number of children: 1  . Years of education: N/A   Occupational History  .  Retired   Social History Main Topics  . Smoking status: Former Smoker    Packs/day: 0.50    Years: 3.00    Types: Cigarettes    Quit date: 09/19/1966  . Smokeless tobacco: Never Used  . Alcohol use 1.8 oz/week    3 Glasses of wine per week     Comment: wine weekly  . Drug use: No  . Sexual activity: Not Asked   Other Topics Concern  . None   Social History Narrative   Work or School: none      Home Situation: lives with husband      Spiritual Beliefs: Christian      Lifestyle:no regular exercise; diet so so              Current  Outpatient Prescriptions:  .  B Complex Vitamins (VITAMIN B COMPLEX PO), Take 1,000 mLs by mouth daily., Disp: , Rfl:  .  Cholecalciferol (VITAMIN D) 1000 UNITS capsule, Take 1,000 Units by mouth daily.  , Disp: , Rfl:  .  docusate sodium (COLACE) 100 MG capsule, Take 100 mg by mouth every evening. , Disp: , Rfl:  .  edoxaban (SAVAYSA) 60 MG TABS tablet, Take 60 mg by mouth daily., Disp: 30 tablet, Rfl: 11 .  estradiol (VIVELLE-DOT) 0.05 MG/24HR, Place 1 patch onto the skin 2 (two) times a week.  , Disp: , Rfl:  .  levothyroxine (SYNTHROID, LEVOTHROID) 88 MCG tablet, Take 1 tablet (88 mcg total) by mouth daily before breakfast. On empty stomach 1 hour before other meds and food., Disp: 90 tablet, Rfl: 1 .  Multiple Vitamins-Minerals (MULTIVITAMIN WITH MINERALS) tablet, Take 1 tablet by mouth daily.  , Disp: , Rfl:  .  psyllium (METAMUCIL) 58.6 % powder, Take 1 packet by mouth daily as needed  (fiber). , Disp: , Rfl:  .  verapamil (CALAN-SR) 120 MG CR tablet, TAKE ONE TABLET BY MOUTH ONCE DAILY AS NEEDED FOR  HEART  PALPITATIONS, Disp: 30 tablet, Rfl: 6  EXAM:  Vitals:   11/21/16 1048  BP: 130/78  Pulse: 84  Temp: 97.8 F (36.6 C)    Body mass index is 25.76 kg/m.  GENERAL: vitals reviewed and listed above, alert, oriented, appears well hydrated and in no acute distress  HEENT: atraumatic, conjunttiva clear, no obvious abnormalities on inspection of external nose and ears, normal appearance of ear canals and TMs, clear nasal congestion, mild post oropharyngeal erythema with PND, no tonsillar edema or exudate, no sinus TTP  NECK: no obvious masses on inspection, I actually do not appreciate a discrete mass on palpation today- perhaps mild fullness of the L thyroid  LUNGS: clear to auscultation bilaterally, no wheezes, rales or rhonchi, good air movement  CV: HRRR, no peripheral edema  MS: moves all extremities without noticeable abnormality  PSYCH: pleasant and cooperative, no obvious depression or anxiety  ASSESSMENT AND PLAN:  Discussed the following assessment and plan:  Neck mass - Plan: US Soft Tissue Head/Neck  Hypothyroidism, unspecified type - Plan: TSH, T4, free  Dysphagia, unspecified type  -US neck and ENT evaluation for her concerns, she plans to call to schedule the ENT eval -rechecking labs per her request and will adjust dose of levothyroxine accordingly, she also may consider brand synthroid. -follow up in 2 months -Patient advised to return or notify a doctor immediately if symptoms worsen or persist or new concerns arise.  There are no Patient Instructions on file for this visit.  Colin Benton R., DO

## 2016-11-21 NOTE — Progress Notes (Signed)
Pre visit review using our clinic review tool, if applicable. No additional management support is needed unless otherwise documented below in the visit note. 

## 2016-11-21 NOTE — Patient Instructions (Signed)
BEFORE YOU LEAVE: -follow up: cancel April follow up and schedule follow up in May - fasting -lab to check thyroid levels  -We placed a referral for you as discussed for the ultrasound of the neck. It usually takes about 1-2 weeks to process and schedule this referral. If you have not heard from [place] regarding this appointment in 2 weeks please contact our office.  -call to schedule Ear, Nose and throat evaluation for the trouble swallowing.

## 2016-11-23 ENCOUNTER — Ambulatory Visit
Admission: RE | Admit: 2016-11-23 | Discharge: 2016-11-23 | Disposition: A | Discharge status: Home / Self Care | Payer: Medicare Other | Source: Ambulatory Visit | Attending: Family Medicine | Admitting: Family Medicine

## 2016-11-23 DIAGNOSIS — R070 Pain in throat: Secondary | ICD-10-CM | POA: Diagnosis not present

## 2016-11-23 DIAGNOSIS — R221 Localized swelling, mass and lump, neck: Secondary | ICD-10-CM

## 2016-12-19 ENCOUNTER — Other Ambulatory Visit: Payer: Self-pay | Admitting: Family Medicine

## 2016-12-27 DIAGNOSIS — R922 Inconclusive mammogram: Secondary | ICD-10-CM | POA: Diagnosis not present

## 2016-12-27 LAB — HM MAMMOGRAPHY

## 2016-12-29 ENCOUNTER — Ambulatory Visit: Payer: Medicare Other | Admitting: Family Medicine

## 2017-01-05 ENCOUNTER — Encounter: Payer: Self-pay | Admitting: Family Medicine

## 2017-01-21 NOTE — Progress Notes (Deleted)
HPI:  Marissa Hart is a pleasant 82 y.o. here for follow up. Chronic medical problems summarized below were reviewed for changes and stability and were updated as needed below. These issues and their treatment remain stable for the most part. Referred to ENT last visit for her concerns of a neck mass and dysphagia - US neck ok.. Denies CP, SOB, DOE, treatment intolerance or new symptoms.  Hypothyroidism: -on levothyroxine 73mcg daily  -[place] 11/2016 ok  Hot Flashes: -on vivelle dot -sees gynecologist for this, on very low dose of transdermal estrogen, s/p hysterectomy  Dyslipidemia: -low hdl  PAF, atrial flutter, PACs: -followed by cardiology/electrophysiology  -s/p ablation in 2014 -on edoxaban, takes verapamil as needed for palpitations   Depressed Mood/Bereavment: -husband passed - grieving but doing well -anniversaries and holidays are tough -she is in a widows group at church   ROS: See pertinent positives and negatives per HPI.  Past Medical History:  Diagnosis Date  . Acquired renal cyst of right kidney   . Anxiety   . Atrial fibrillation and flutter ([place])    s/p PVI and CTI by Dr Rayann Heman 04/2013  . Blood transfusion   . Cataract   . CHEST WALL PAIN, HX OF 07/28/2007   Qualifier: Diagnosis of  By: Julien Girt CMA, [name]    . Diverticulosis of colon   . DJD (degenerative joint disease) 10/18/2011  . Erysipelas   . Fibromyalgia   . GERD (gastroesophageal reflux disease)   . Hiatal hernia   . Hot flashes - on low dose HRT with gynecologist, Dr. Nori Riis 03/31/2014  . Hx of colonic polyps   . Hyperlipidemia   . MITRAL VALVE PROLAPSE 07/28/2007   Qualifier: Diagnosis of  By: Julien Girt CMA, [name]    . Supraventricular tachycardia ([place])    AV nodal reentry  s/p RFCA 2001  . Thyroid disease     Past Surgical History:  Procedure Laterality Date  . ABDOMINAL HYSTERECTOMY    . ATRIAL FIBRILLATION ABLATION N/A 04/30/2013   Procedure: ATRIAL FIBRILLATION ABLATION;  Surgeon:  [name], MD;  Location: [facility] CATH LAB;  Service: Cardiovascular;  Laterality: N/A;PVI and CTI by Dr Rayann Heman   . BREAST BIOPSY Left    benign  . BREAST BIOPSY  2011   benign  . CATARACT EXTRACTION BILATERAL W/ ANTERIOR VITRECTOMY    . COLONOSCOPY    . COLONOSCOPY    . ESOPHAGOGASTRODUODENOSCOPY    . KNEE ARTHROSCOPY  2012  . OOPHORECTOMY    . ROTATOR CUFF REPAIR  08/2001   Dr. Gladstone Lighter  . TEE WITHOUT CARDIOVERSION N/A 04/29/2013   Procedure: TRANSESOPHAGEAL ECHOCARDIOGRAM (TEE);  Surgeon: Jettie Booze, MD;  Location: [facility];  Service: Cardiovascular;  Laterality: N/A;  . UPPER GASTROINTESTINAL ENDOSCOPY      No family history on file.  Social History   Social History  . Marital status: Married    Spouse name: N/A  . Number of children: 1  . Years of education: N/A   Occupational History  .  Retired   Social History Main Topics  . Smoking status: Former Smoker    Packs/day: 0.50    Years: 3.00    Types: Cigarettes    Quit date: 09/19/1966  . Smokeless tobacco: Never Used  . Alcohol use 1.8 oz/week    3 Glasses of wine per week     Comment: wine weekly  . Drug use: No  . Sexual activity: Not on file   Other Topics Concern  . Not  on file   Social History Narrative   Work or School: none      Home Situation: lives with husband      Spiritual Beliefs: Christian      Lifestyle:no regular exercise; diet so so              Current Outpatient Prescriptions:  .  B Complex Vitamins (VITAMIN B COMPLEX PO), Take 1,000 mLs by mouth daily., Disp: , Rfl:  .  Cholecalciferol (VITAMIN D) 1000 UNITS capsule, Take 1,000 Units by mouth daily.  , Disp: , Rfl:  .  docusate sodium (COLACE) 100 MG capsule, Take 100 mg by mouth every evening. , Disp: , Rfl:  .  edoxaban (SAVAYSA) 60 MG TABS tablet, Take 60 mg by mouth daily., Disp: 30 tablet, Rfl: 11 .  estradiol (VIVELLE-DOT) 0.05 MG/24HR, Place 1 patch onto the skin 2 (two) times a week.  , Disp: , Rfl:  .   levothyroxine (SYNTHROID, LEVOTHROID) 88 MCG tablet, Take 1 tablet (88 mcg total) by mouth daily before breakfast. On empty stomach 1 hour before other meds and food., Disp: 90 tablet, Rfl: 1 .  levothyroxine (SYNTHROID, LEVOTHROID) 88 MCG tablet, TAKE 88MCG BY MOUTH 2 DAYS PER WEEK, TAKE 75 MCG ON ALL OTHER DAYS, Disp: 90 tablet, Rfl: 1 .  Multiple Vitamins-Minerals (MULTIVITAMIN WITH MINERALS) tablet, Take 1 tablet by mouth daily.  , Disp: , Rfl:  .  psyllium (METAMUCIL) 58.6 % powder, Take 1 packet by mouth daily as needed (fiber). , Disp: , Rfl:  .  verapamil (CALAN-SR) 120 MG CR tablet, TAKE ONE TABLET BY MOUTH ONCE DAILY AS NEEDED FOR  HEART  PALPITATIONS, Disp: 30 tablet, Rfl: 6  EXAM:  There were no vitals filed for this visit.  There is no height or weight on file to calculate BMI.  GENERAL: vitals reviewed and listed above, alert, oriented, appears well hydrated and in no acute distress  HEENT: atraumatic, conjunttiva clear, no obvious abnormalities on inspection of external nose and ears  NECK: no obvious masses on inspection  LUNGS: clear to auscultation bilaterally, no wheezes, rales or rhonchi, good air movement  CV: HRRR, no peripheral edema  MS: moves all extremities without noticeable abnormality  PSYCH: pleasant and cooperative, no obvious depression or anxiety  ASSESSMENT AND PLAN:  Discussed the following assessment and plan:  No diagnosis found.  -Patient advised to return or notify a doctor immediately if symptoms worsen or persist or new concerns arise.  There are no Patient Instructions on file for this visit.  Colin Benton R., DO

## 2017-01-23 ENCOUNTER — Ambulatory Visit: Payer: Medicare Other | Admitting: Family Medicine

## 2017-01-24 ENCOUNTER — Encounter: Payer: Self-pay | Admitting: Family Medicine

## 2017-01-24 ENCOUNTER — Ambulatory Visit (INDEPENDENT_AMBULATORY_CARE_PROVIDER_SITE_OTHER): Payer: Medicare Other | Admitting: Family Medicine

## 2017-01-24 VITALS — BP 108/72 | HR 71 | Temp 98.2°F | Ht 62.25 in | Wt 145.2 lb

## 2017-01-24 DIAGNOSIS — I480 Paroxysmal atrial fibrillation: Secondary | ICD-10-CM

## 2017-01-24 DIAGNOSIS — E785 Hyperlipidemia, unspecified: Secondary | ICD-10-CM | POA: Diagnosis not present

## 2017-01-24 DIAGNOSIS — Z789 Other specified health status: Secondary | ICD-10-CM

## 2017-01-24 DIAGNOSIS — E039 Hypothyroidism, unspecified: Secondary | ICD-10-CM

## 2017-01-24 LAB — CBC WITH DIFFERENTIAL/PLATELET
Basophils Absolute: 0 10*3/uL (ref 0.0–0.1)
Basophils Relative: 0.6 % (ref 0.0–3.0)
EOS ABS: 0.1 10*3/uL (ref 0.0–0.7)
EOS PCT: 2.1 % (ref 0.0–5.0)
HEMATOCRIT: 40 % (ref 36.0–46.0)
HEMOGLOBIN: 13.5 g/dL (ref 12.0–15.0)
LYMPHS PCT: 22.3 % (ref 12.0–46.0)
Lymphs Abs: 1.5 10*3/uL (ref 0.7–4.0)
MCHC: 33.7 g/dL (ref 30.0–36.0)
MCV: 96 fl (ref 78.0–100.0)
MONOS PCT: 11.2 % (ref 3.0–12.0)
Monocytes Absolute: 0.7 10*3/uL (ref 0.1–1.0)
Neutro Abs: 4.2 10*3/uL (ref 1.4–7.7)
Neutrophils Relative %: 63.8 % (ref 43.0–77.0)
Platelets: 241 10*3/uL (ref 150.0–400.0)
RBC: 4.17 Mil/uL (ref 3.87–5.11)
RDW: 13.7 % (ref 11.5–15.5)
WBC: 6.6 10*3/uL (ref 4.0–10.5)

## 2017-01-24 LAB — BASIC METABOLIC PANEL
BUN: 15 mg/dL (ref 6–23)
CHLORIDE: 103 meq/L (ref 96–112)
CO2: 29 mEq/L (ref 19–32)
Calcium: 9.2 mg/dL (ref 8.4–10.5)
Creatinine, Ser: 0.8 mg/dL (ref 0.40–1.20)
GFR: 73.33 mL/min (ref >60.00)
Glucose, Bld: 90 mg/dL (ref 70–99)
POTASSIUM: 4.8 meq/L (ref 3.5–5.1)
Sodium: 137 mEq/L (ref 135–145)

## 2017-01-24 NOTE — Patient Instructions (Signed)
BEFORE YOU LEAVE: -scan in advanced directives -copy and provide DNR back to patient -labs -follow up: 3-4 months  We have ordered labs or studies at this visit. It can take up to 1-2 weeks for results and processing. IF results require follow up or explanation, we will call you with instructions. Clinically stable results will be released to your [facility]. If you have not heard from [place] or cannot find your results in [facility] in 2 weeks please contact our office at [phone].  If you are not yet signed up for [facility], please consider signing up.   We recommend the following healthy lifestyle for LIFE: 1) Small portions.   Tip: eat off of a salad plate instead of a dinner plate.  Tip: if you need more or a snack choose fruits, veggies and/or a handful of nuts or seeds.  2) Eat a healthy clean diet.  * Tip: Avoid (less then 1 serving per week): processed foods, sweets, sweetened drinks, white starches (rice, flour, bread, potatoes, pasta, etc), red meat, fast foods, butter  *Tip: CHOOSE instead   * 5-9 servings per day of fresh or frozen fruits and vegetables (but not corn, potatoes, bananas, canned or dried fruit)   *nuts and seeds, beans   *olives and olive oil   *small portions of lean meats such as fish and white chicken    *small portions of whole grains  3)Get at least 150 minutes of sweaty aerobic exercise per week.  4)Reduce stress - consider counseling, meditation and relaxation to balance other aspects of your life.

## 2017-01-24 NOTE — Progress Notes (Signed)
HPI:  Marissa Hart is a pleasant 82 y.o. here for follow up. Chronic medical problems summarized below were reviewed for changes and stability and were updated as needed below. These issues and their treatment remain stable for the most part. Doing well. Wants to minimize extreme measures if ever ill and completed yellow DNR to sign. Reviewed significance at length and she is adamant does not want resuscitation if ever in cardiac or respiratory critical condition. Had already complete. Denies CP, SOB, DOE, treatment intolerance or new symptoms.   Hypothyroidism: -on levothyroxine 78mcg daily  -doing well  Hot Flashes: -on vivelle dot -sees gynecologist for this, on very low dose of transdermal estrogen, s/p hysterectomy  Dyslipidemia: -low hdl -she does not want to check further - reports would never take medications for this -doing healthy lifestyle  PAF, atrial flutter, PACs: -followed by cardiology/electrophysiology  -s/p ablation in 2014 -on edoxaban, takes verapamil as needed for palpitations - did take some last night; sometimes does make her BP drop a bit  Depressed Mood/Bereavment: -husband passed - grieving but doing well -anniversaries and holidays are tough -she is in a widows group at church and this has been very helpful, feels is coping well  ROS: See pertinent positives and negatives per HPI.  Past Medical History:  Diagnosis Date  . Acquired renal cyst of right kidney   . Anxiety   . Atrial fibrillation and flutter ([place])    s/p PVI and CTI by Dr Rayann Heman 04/2013  . Blood transfusion   . Cataract   . CHEST WALL PAIN, HX OF 07/28/2007   Qualifier: Diagnosis of  By: Julien Girt CMA, [name]    . Diverticulosis of colon   . DJD (degenerative joint disease) 10/18/2011  . Erysipelas   . Fibromyalgia   . GERD (gastroesophageal reflux disease)   . Hiatal hernia   . Hot flashes - on low dose HRT with gynecologist, Dr. Nori Riis 03/31/2014  . Hx of colonic polyps   .  Hyperlipidemia   . MITRAL VALVE PROLAPSE 07/28/2007   Qualifier: Diagnosis of  By: Julien Girt CMA, [name]    . Supraventricular tachycardia ([place])    AV nodal reentry  s/p RFCA 2001  . Thyroid disease     Past Surgical History:  Procedure Laterality Date  . ABDOMINAL HYSTERECTOMY    . ATRIAL FIBRILLATION ABLATION N/A 04/30/2013   Procedure: ATRIAL FIBRILLATION ABLATION;  Surgeon: [name], MD;  Location: [facility] CATH LAB;  Service: Cardiovascular;  Laterality: N/A;PVI and CTI by Dr Rayann Heman   . BREAST BIOPSY Left    benign  . BREAST BIOPSY  2011   benign  . CATARACT EXTRACTION BILATERAL W/ ANTERIOR VITRECTOMY    . COLONOSCOPY    . COLONOSCOPY    . ESOPHAGOGASTRODUODENOSCOPY    . KNEE ARTHROSCOPY  2012  . OOPHORECTOMY    . ROTATOR CUFF REPAIR  08/2001   Dr. Gladstone Lighter  . TEE WITHOUT CARDIOVERSION N/A 04/29/2013   Procedure: TRANSESOPHAGEAL ECHOCARDIOGRAM (TEE);  Surgeon: Jettie Booze, MD;  Location: [facility];  Service: Cardiovascular;  Laterality: N/A;  . UPPER GASTROINTESTINAL ENDOSCOPY      No family history on file.  Social History   Social History  . Marital status: Married    Spouse name: N/A  . Number of children: 1  . Years of education: N/A   Occupational History  .  Retired   Social History Main Topics  . Smoking status: Former Smoker    Packs/day: 0.50  Years: 3.00    Types: Cigarettes    Quit date: 09/19/1966  . Smokeless tobacco: Never Used  . Alcohol use 1.8 oz/week    3 Glasses of wine per week     Comment: wine weekly  . Drug use: No  . Sexual activity: Not Asked   Other Topics Concern  . None   Social History Narrative   Work or School: none      Home Situation: lives with husband      Spiritual Beliefs: Christian      Lifestyle:no regular exercise; diet so so              Current Outpatient Prescriptions:  .  B Complex Vitamins (VITAMIN B COMPLEX PO), Take 1,000 mLs by mouth daily., Disp: , Rfl:  .  Cholecalciferol (VITAMIN D)  1000 UNITS capsule, Take 1,000 Units by mouth daily.  , Disp: , Rfl:  .  docusate sodium (COLACE) 100 MG capsule, Take 100 mg by mouth every evening. , Disp: , Rfl:  .  edoxaban (SAVAYSA) 60 MG TABS tablet, Take 60 mg by mouth daily., Disp: 30 tablet, Rfl: 11 .  estradiol (VIVELLE-DOT) 0.05 MG/24HR, Place 1 patch onto the skin 2 (two) times a week.  , Disp: , Rfl:  .  levothyroxine (SYNTHROID, LEVOTHROID) 88 MCG tablet, Take 1 tablet (88 mcg total) by mouth daily before breakfast. On empty stomach 1 hour before other meds and food., Disp: 90 tablet, Rfl: 1 .  levothyroxine (SYNTHROID, LEVOTHROID) 88 MCG tablet, TAKE 88MCG BY MOUTH 2 DAYS PER WEEK, TAKE 75 MCG ON ALL OTHER DAYS, Disp: 90 tablet, Rfl: 1 .  Multiple Vitamins-Minerals (MULTIVITAMIN WITH MINERALS) tablet, Take 1 tablet by mouth daily.  , Disp: , Rfl:  .  psyllium (METAMUCIL) 58.6 % powder, Take 1 packet by mouth daily as needed (fiber). , Disp: , Rfl:  .  verapamil (CALAN-SR) 120 MG CR tablet, TAKE ONE TABLET BY MOUTH ONCE DAILY AS NEEDED FOR  HEART  PALPITATIONS, Disp: 30 tablet, Rfl: 6  EXAM:  Vitals:   01/24/17 0918  BP: 108/72  Pulse: 71  Temp: 98.2 F (36.8 C)    Body mass index is 26.34 kg/m.  GENERAL: vitals reviewed and listed above, alert, oriented, appears well hydrated and in no acute distress  HEENT: atraumatic, conjunttiva clear, no obvious abnormalities on inspection of external nose and ears  NECK: no obvious masses on inspection  LUNGS: clear to auscultation bilaterally, no wheezes, rales or rhonchi, good air movement  CV: HRRR, no peripheral edema  MS: moves all extremities without noticeable abnormality  PSYCH: pleasant and cooperative, no obvious depression or anxiety  ASSESSMENT AND PLAN:  Discussed the following assessment and plan:  Hypothyroidism, unspecified type  Paroxysmal atrial fibrillation (HCC) - Plan: Basic metabolic panel, CBC with Differential/Platelet  Dyslipidemia  Copy of  advanced directive obtained  -discussed significance and meaning of DNR/MOST form - she wished to complete as does not want any resuscitation if ever critically ill given her age -advanced directives copy to medical records -Patient advised to return or notify a doctor immediately if symptoms worsen or persist or new concerns arise.  Patient Instructions  BEFORE YOU LEAVE: -scan in advanced directives -copy and provide DNR back to patient -labs -follow up: 3-4 months  We have ordered labs or studies at this visit. It can take up to 1-2 weeks for results and processing. IF results require follow up or explanation, we will call you with instructions. Clinically  stable results will be released to your [facility]. If you have not heard from [place] or cannot find your results in [facility] in 2 weeks please contact our office at [phone].  If you are not yet signed up for [facility], please consider signing up.   We recommend the following healthy lifestyle for LIFE: 1) Small portions.   Tip: eat off of a salad plate instead of a dinner plate.  Tip: if you need more or a snack choose fruits, veggies and/or a handful of nuts or seeds.  2) Eat a healthy clean diet.  * Tip: Avoid (less then 1 serving per week): processed foods, sweets, sweetened drinks, white starches (rice, flour, bread, potatoes, pasta, etc), red meat, fast foods, butter  *Tip: CHOOSE instead   * 5-9 servings per day of fresh or frozen fruits and vegetables (but not corn, potatoes, bananas, canned or dried fruit)   *nuts and seeds, beans   *olives and olive oil   *small portions of lean meats such as fish and white chicken    *small portions of whole grains  3)Get at least 150 minutes of sweaty aerobic exercise per week.  4)Reduce stress - consider counseling, meditation and relaxation to balance other aspects of your life.        Colin Benton R., DO

## 2017-01-24 NOTE — Progress Notes (Signed)
Pre visit review using our clinic review tool, if applicable. No additional management support is needed unless otherwise documented below in the visit note. 

## 2017-01-27 ENCOUNTER — Ambulatory Visit: Payer: Medicare Other | Admitting: Family Medicine

## 2017-03-27 ENCOUNTER — Ambulatory Visit: Payer: Medicare Other | Admitting: Family Medicine

## 2017-04-20 DIAGNOSIS — H01133 Eczematous dermatitis of right eye, unspecified eyelid: Secondary | ICD-10-CM | POA: Diagnosis not present

## 2017-05-24 ENCOUNTER — Ambulatory Visit (HOSPITAL_COMMUNITY)
Admission: RE | Admit: 2017-05-24 | Discharge: 2017-05-24 | Disposition: A | Discharge status: Home / Self Care | Payer: Medicare Other | Source: Ambulatory Visit | Attending: Internal Medicine | Admitting: Internal Medicine

## 2017-05-24 ENCOUNTER — Encounter (HOSPITAL_COMMUNITY)
Admission: RE | Disposition: A | Discharge status: Home / Self Care | Payer: Self-pay | Source: Ambulatory Visit | Attending: Internal Medicine

## 2017-05-24 ENCOUNTER — Ambulatory Visit (INDEPENDENT_AMBULATORY_CARE_PROVIDER_SITE_OTHER): Payer: Medicare Other | Admitting: Internal Medicine

## 2017-05-24 VITALS — BP 130/74 | HR 78 | Ht 62.25 in | Wt 144.6 lb

## 2017-05-24 DIAGNOSIS — M1990 Unspecified osteoarthritis, unspecified site: Secondary | ICD-10-CM | POA: Diagnosis not present

## 2017-05-24 DIAGNOSIS — Z79899 Other long term (current) drug therapy: Secondary | ICD-10-CM | POA: Diagnosis not present

## 2017-05-24 DIAGNOSIS — M797 Fibromyalgia: Secondary | ICD-10-CM | POA: Insufficient documentation

## 2017-05-24 DIAGNOSIS — E785 Hyperlipidemia, unspecified: Secondary | ICD-10-CM | POA: Diagnosis not present

## 2017-05-24 DIAGNOSIS — I4891 Unspecified atrial fibrillation: Secondary | ICD-10-CM | POA: Insufficient documentation

## 2017-05-24 DIAGNOSIS — I480 Paroxysmal atrial fibrillation: Secondary | ICD-10-CM

## 2017-05-24 DIAGNOSIS — R002 Palpitations: Secondary | ICD-10-CM | POA: Diagnosis not present

## 2017-05-24 DIAGNOSIS — F419 Anxiety disorder, unspecified: Secondary | ICD-10-CM | POA: Diagnosis not present

## 2017-05-24 DIAGNOSIS — K219 Gastro-esophageal reflux disease without esophagitis: Secondary | ICD-10-CM | POA: Diagnosis not present

## 2017-05-24 HISTORY — PX: LOOP RECORDER INSERTION: EP1214

## 2017-05-24 SURGERY — LOOP RECORDER INSERTION

## 2017-05-24 MED ORDER — LIDOCAINE-EPINEPHRINE 1 %-1:100000 IJ SOLN
INTRAMUSCULAR | Status: AC
  Filled 2017-05-24: qty 1

## 2017-05-24 MED ORDER — LIDOCAINE-EPINEPHRINE 1 %-1:100000 IJ SOLN
INTRAMUSCULAR | Status: DC | PRN
  Administered 2017-05-24: 2 mL via INTRADERMAL

## 2017-05-24 SURGICAL SUPPLY — 2 items
LOOP REVEAL LINQSYS (Prosthesis & Implant Heart) ×2 IMPLANT
PACK LOOP INSERTION (CUSTOM PROCEDURE TRAY) ×2 IMPLANT

## 2017-05-24 NOTE — Discharge Instructions (Signed)
See  dc paper for loop.

## 2017-05-24 NOTE — Interval H&P Note (Signed)
History and Physical Interval Note:  05/24/2017 3:43 PM  Marissa Hart  has presented today for surgery, with the diagnosis of afib  The various methods of treatment have been discussed with the patient and family. After consideration of risks, benefits and other options for treatment, the patient has consented to  Procedure(s): LOOP RECORDER INSERTION (N/A) as a surgical intervention .  The patient's history has been reviewed, patient examined, no change in status, stable for surgery.  I have reviewed the patient's chart and labs.  Questions were answered to the patient's satisfaction.     [name]

## 2017-05-24 NOTE — Patient Instructions (Addendum)
Medication Instructions:  Your physician recommends that you continue on your current medications as directed. Please refer to the Current Medication list given to you today.   Labwork: None ordered   Testing/Procedures: LINQ implant today---Go to the [employer] and proceed to Short Stay  Follow-Up: Your physician recommends that you schedule a follow-up appointment in: 7-10 days if device clinic for wound check

## 2017-05-24 NOTE — Progress Notes (Signed)
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  Since last being seen in our clinic, the patient reports doing very well.  She has occasional palpitations of unclear significance.  Today, she denies symptoms of chest pain, shortness of breath,  lower extremity edema, dizziness, presyncope, or syncope.  The patient is otherwise without complaint today.   Past Medical History:  Diagnosis Date  . Acquired renal cyst of right kidney   . Anxiety   . Atrial fibrillation and flutter ([place])    s/p PVI and CTI by Dr Rayann Heman 04/2013  . Blood transfusion   . Cataract   . CHEST WALL PAIN, HX OF 07/28/2007   Qualifier: Diagnosis of  By: Julien Girt CMA, [name]    . Diverticulosis of colon   . DJD (degenerative joint disease) 10/18/2011  . Erysipelas   . Fibromyalgia   . GERD (gastroesophageal reflux disease)   . Hiatal hernia   . Hot flashes - on low dose HRT with gynecologist, Dr. Nori Riis 03/31/2014  . Hx of colonic polyps   . Hyperlipidemia   . MITRAL VALVE PROLAPSE 07/28/2007   Qualifier: Diagnosis of  By: Julien Girt CMA, [name]    . Supraventricular tachycardia ([place])    AV nodal reentry  s/p RFCA 2001  . Thyroid disease    Past Surgical History:  Procedure Laterality Date  . ABDOMINAL HYSTERECTOMY    . ATRIAL FIBRILLATION ABLATION N/A 04/30/2013   Procedure: ATRIAL FIBRILLATION ABLATION;  Surgeon: [name], MD;  Location: [facility] CATH LAB;  Service: Cardiovascular;  Laterality: N/A;PVI and CTI by Dr Rayann Heman   . BREAST BIOPSY Left    benign  . BREAST BIOPSY  2011   benign  . CATARACT EXTRACTION BILATERAL W/ ANTERIOR VITRECTOMY    . COLONOSCOPY    . COLONOSCOPY    . ESOPHAGOGASTRODUODENOSCOPY    . KNEE ARTHROSCOPY  2012  . OOPHORECTOMY    . ROTATOR CUFF REPAIR  08/2001   Dr. Gladstone Lighter  . TEE WITHOUT CARDIOVERSION N/A 04/29/2013   Procedure: TRANSESOPHAGEAL ECHOCARDIOGRAM (TEE);  Surgeon: Jettie Booze, MD;  Location: [facility];  Service: Cardiovascular;  Laterality: N/A;  . UPPER GASTROINTESTINAL ENDOSCOPY      ROS- all systems are reviewed and negatives except as per HPI above  Current Outpatient Prescriptions  Medication Sig Dispense Refill  . Cholecalciferol (VITAMIN D) 1000 UNITS capsule Take 1,000 Units by mouth daily.      . Cyanocobalamin (VITAMIN B-12 PO) Take by mouth daily.    [name] Kitchen docusate sodium (COLACE) 100 MG capsule Take 100 mg by mouth every evening.     . edoxaban (SAVAYSA) 60 MG TABS tablet Take 60 mg by mouth daily. 30 tablet 11  . estradiol (VIVELLE-DOT) 0.05 MG/24HR Place 1 patch onto the skin 2 (two) times a week.      . levothyroxine (SYNTHROID, LEVOTHROID) 88 MCG tablet Take 1 tablet (88 mcg total) by mouth daily before breakfast. On empty stomach 1 hour before other meds and food. 90 tablet 1  . levothyroxine (SYNTHROID, LEVOTHROID) 88 MCG tablet TAKE 88MCG BY MOUTH 2 DAYS PER WEEK, TAKE 75 MCG ON ALL OTHER DAYS 90 tablet 1  . Multiple Vitamins-Minerals (MULTIVITAMIN WITH MINERALS) tablet Take 1 tablet by mouth daily.      . psyllium (METAMUCIL) 58.6 % powder Take 1 packet by mouth daily as needed (fiber).     [name] Kitchen  verapamil (CALAN-SR) 120 MG CR tablet TAKE ONE TABLET BY MOUTH ONCE DAILY AS NEEDED FOR  HEART  PALPITATIONS 30 tablet 6   No current facility-administered medications for this visit.     Physical Exam: Vitals:   05/24/17 1348  BP: 130/74  Pulse: 78  Weight: 144 lb 9.6 oz (65.6 kg)  Height: 5' 2.25" (1.581 m)    GEN- The patient is well appearing, alert and oriented x 3 today.   Head- normocephalic, atraumatic Eyes-  Sclera clear, conjunctiva pink Ears- hearing intact Oropharynx- clear Lungs- Clear to ausculation bilaterally, normal work of breathing Heart- Regular rate and rhythm, no murmurs, rubs or gallops, PMI not laterally displaced GI- soft, NT, ND, + BS Extremities- no clubbing, cyanosis, or edema  EKG tracing ordered today  is personally reviewed and shows sinus rhythm 78 bpm, PR 150 msec, QRS 70 msec, Qtc 442  Assessment and Plan:  1. afib She has done well for several years post ablation off of AAD therapy chads2vasc score is 3.  She is on savaysa She has palpitations of unclear significance. Risks and benefits to ILR implantation were discussed with her today.  She understands risks and wishes to proceed with ILR implant for further evaluation of palpitations and afib management post ablation. If she is found to have afib then we will consider AAD therapy vs repeat ablation at that time.   [name] MD, [facility] 05/24/2017 2:10 PM

## 2017-05-24 NOTE — H&P (View-Only) (Signed)
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  Since last being seen in our clinic, the patient reports doing very well.  She has occasional palpitations of unclear significance.  Today, she denies symptoms of chest pain, shortness of breath,  lower extremity edema, dizziness, presyncope, or syncope.  The patient is otherwise without complaint today.   Past Medical History:  Diagnosis Date  . Acquired renal cyst of right kidney   . Anxiety   . Atrial fibrillation and flutter ([place])    s/p PVI and CTI by Dr Rayann Heman 04/2013  . Blood transfusion   . Cataract   . CHEST WALL PAIN, HX OF 07/28/2007   Qualifier: Diagnosis of  By: Julien Girt CMA, [name]    . Diverticulosis of colon   . DJD (degenerative joint disease) 10/18/2011  . Erysipelas   . Fibromyalgia   . GERD (gastroesophageal reflux disease)   . Hiatal hernia   . Hot flashes - on low dose HRT with gynecologist, Dr. Nori Riis 03/31/2014  . Hx of colonic polyps   . Hyperlipidemia   . MITRAL VALVE PROLAPSE 07/28/2007   Qualifier: Diagnosis of  By: Julien Girt CMA, [name]    . Supraventricular tachycardia ([place])    AV nodal reentry  s/p RFCA 2001  . Thyroid disease    Past Surgical History:  Procedure Laterality Date  . ABDOMINAL HYSTERECTOMY    . ATRIAL FIBRILLATION ABLATION N/A 04/30/2013   Procedure: ATRIAL FIBRILLATION ABLATION;  Surgeon: [name], MD;  Location: [facility] CATH LAB;  Service: Cardiovascular;  Laterality: N/A;PVI and CTI by Dr Rayann Heman   . BREAST BIOPSY Left    benign  . BREAST BIOPSY  2011   benign  . CATARACT EXTRACTION BILATERAL W/ ANTERIOR VITRECTOMY    . COLONOSCOPY    . COLONOSCOPY    . ESOPHAGOGASTRODUODENOSCOPY    . KNEE ARTHROSCOPY  2012  . OOPHORECTOMY    . ROTATOR CUFF REPAIR  08/2001   Dr. Gladstone Lighter  . TEE WITHOUT CARDIOVERSION N/A 04/29/2013   Procedure: TRANSESOPHAGEAL ECHOCARDIOGRAM (TEE);  Surgeon: Jettie Booze, MD;  Location: [place];  Service: Cardiovascular;  Laterality: N/A;  . UPPER GASTROINTESTINAL ENDOSCOPY      ROS- all systems are reviewed and negatives except as per HPI above  Current Outpatient Prescriptions  Medication Sig Dispense Refill  . Cholecalciferol (VITAMIN D) 1000 UNITS capsule Take 1,000 Units by mouth daily.      . Cyanocobalamin (VITAMIN B-12 PO) Take by mouth daily.    [name] Kitchen docusate sodium (COLACE) 100 MG capsule Take 100 mg by mouth every evening.     . edoxaban (SAVAYSA) 60 MG TABS tablet Take 60 mg by mouth daily. 30 tablet 11  . estradiol (VIVELLE-DOT) 0.05 MG/24HR Place 1 patch onto the skin 2 (two) times a week.      . levothyroxine (SYNTHROID, LEVOTHROID) 88 MCG tablet Take 1 tablet (88 mcg total) by mouth daily before breakfast. On empty stomach 1 hour before other meds and food. 90 tablet 1  . levothyroxine (SYNTHROID, LEVOTHROID) 88 MCG tablet TAKE 88MCG BY MOUTH 2 DAYS PER WEEK, TAKE 75 MCG ON ALL OTHER DAYS 90 tablet 1  . Multiple Vitamins-Minerals (MULTIVITAMIN WITH MINERALS) tablet Take 1 tablet by mouth daily.      . psyllium (METAMUCIL) 58.6 % powder Take 1 packet by mouth daily as needed (fiber).     [name] Kitchen  verapamil (CALAN-SR) 120 MG CR tablet TAKE ONE TABLET BY MOUTH ONCE DAILY AS NEEDED FOR  HEART  PALPITATIONS 30 tablet 6   No current facility-administered medications for this visit.     Physical Exam: Vitals:   05/24/17 1348  BP: 130/74  Pulse: 78  Weight: 144 lb 9.6 oz (65.6 kg)  Height: 5' 2.25" (1.581 m)    GEN- The patient is well appearing, alert and oriented x 3 today.   Head- normocephalic, atraumatic Eyes-  Sclera clear, conjunctiva pink Ears- hearing intact Oropharynx- clear Lungs- Clear to ausculation bilaterally, normal work of breathing Heart- Regular rate and rhythm, no murmurs, rubs or gallops, PMI not laterally displaced GI- soft, NT, ND, + BS Extremities- no clubbing, cyanosis, or edema  EKG tracing ordered today  is personally reviewed and shows sinus rhythm 78 bpm, PR 150 msec, QRS 70 msec, Qtc 442  Assessment and Plan:  1. afib She has done well for several years post ablation off of AAD therapy chads2vasc score is 3.  She is on savaysa She has palpitations of unclear significance. Risks and benefits to ILR implantation were discussed with her today.  She understands risks and wishes to proceed with ILR implant for further evaluation of palpitations and afib management post ablation. If she is found to have afib then we will consider AAD therapy vs repeat ablation at that time.   [name] MD, [facility] 05/24/2017 2:10 PM

## 2017-05-25 ENCOUNTER — Encounter (HOSPITAL_COMMUNITY): Payer: Self-pay | Admitting: Internal Medicine

## 2017-05-25 ENCOUNTER — Telehealth: Payer: Self-pay | Admitting: *Deleted

## 2017-05-25 DIAGNOSIS — D485 Neoplasm of uncertain behavior of skin: Secondary | ICD-10-CM | POA: Diagnosis not present

## 2017-05-25 DIAGNOSIS — L309 Dermatitis, unspecified: Secondary | ICD-10-CM | POA: Diagnosis not present

## 2017-05-25 NOTE — Telephone Encounter (Signed)
Spoke with patient regarding sending manual transmission. Manual transmission successful. 2 tachy episodes, longest was 13 minutes 57 seconds, Avg V rate 158 bpm.  Patient states she felt her heart racing around 5-5:30 05/24/17. Advised patient will review with Dr. Rayann Heman and call back if further recommendations. Patient verbalized understanding.  Education done on Carelink home monitor and symptom activator. Patient verbalized understanding and appreciation.

## 2017-05-29 NOTE — Progress Notes (Signed)
HPI:  Marissa Hart is a pleasant 82 y.o. here for follow up. Chronic medical problems summarized below were reviewed for changes. Doing well. No complaints today. She had a heart monitor implanted earlier this week. She also had a biopsy of a skin lesion on her face with her dermatologist. She reports she is doing well following both of these minor procedures. Denies CP, SOB, DOE, treatment intolerance or new symptoms. Due for flu shot, labs (tsh, cbc, bmp)  Hypothyroidism: -on levothyroxine 91mcg daily  -doing well  Hot Flashes: -on vivelle dot -sees gynecologist for this, on very low dose of transdermal estrogen, s/p hysterectomy  Dyslipidemia: -low hdl -she does not want to check further - reports would never take medications for this -doing healthy lifestyle  PAF, atrial flutter, PACs: -followed by cardiology/electrophysiology  -s/p ablation in 2014 -on edoxaban, takes verapamil as needed for palpitations - did take some last night; sometimes does make her BP drop a bit  Bereavment: -husband passed - grieving but doing well -anniversaries and holidays are tough -she is in a widows group at church and this has been very helpful, feels is coping well   ROS: See pertinent positives and negatives per HPI.  Past Medical History:  Diagnosis Date  . Acquired renal cyst of right kidney   . Anxiety   . Atrial fibrillation and flutter ([place])    s/p PVI and CTI by Dr Rayann Heman 04/2013  . Blood transfusion   . Cataract   . CHEST WALL PAIN, HX OF 07/28/2007   Qualifier: Diagnosis of  By: Julien Girt CMA, [name]    . Diverticulosis of colon   . DJD (degenerative joint disease) 10/18/2011  . Erysipelas   . Fibromyalgia   . GERD (gastroesophageal reflux disease)   . Hiatal hernia   . Hot flashes - on low dose HRT with gynecologist, Dr. Nori Riis 03/31/2014  . Hx of colonic polyps   . Hyperlipidemia   . MITRAL VALVE PROLAPSE 07/28/2007   Qualifier: Diagnosis of  By: Julien Girt CMA, [name]     . Supraventricular tachycardia ([place])    AV nodal reentry  s/p RFCA 2001  . Thyroid disease     Past Surgical History:  Procedure Laterality Date  . ABDOMINAL HYSTERECTOMY    . ATRIAL FIBRILLATION ABLATION N/A 04/30/2013   Procedure: ATRIAL FIBRILLATION ABLATION;  Surgeon: [name], MD;  Location: [facility] CATH LAB;  Service: Cardiovascular;  Laterality: N/A;PVI and CTI by Dr Rayann Heman   . BREAST BIOPSY Left    benign  . BREAST BIOPSY  2011   benign  . CATARACT EXTRACTION BILATERAL W/ ANTERIOR VITRECTOMY    . COLONOSCOPY    . COLONOSCOPY    . ESOPHAGOGASTRODUODENOSCOPY    . KNEE ARTHROSCOPY  2012  . LOOP RECORDER INSERTION N/A 05/24/2017   Procedure: LOOP RECORDER INSERTION;  Surgeon: [name], MD;  Location: [place] CV LAB;  Service: Cardiovascular;  Laterality: N/A;  . OOPHORECTOMY    . ROTATOR CUFF REPAIR  08/2001   Dr. Gladstone Lighter  . TEE WITHOUT CARDIOVERSION N/A 04/29/2013   Procedure: TRANSESOPHAGEAL ECHOCARDIOGRAM (TEE);  Surgeon: Jettie Booze, MD;  Location: [place];  Service: Cardiovascular;  Laterality: N/A;  . UPPER GASTROINTESTINAL ENDOSCOPY      No family history on file.  Social History   Social History  . Marital status: Married    Spouse name: N/A  . Number of children: 1  . Years of education: N/A   Occupational History  .  Retired  Social History Main Topics  . Smoking status: Former Smoker    Packs/day: 0.50    Years: 3.00    Types: Cigarettes    Quit date: 09/19/1966  . Smokeless tobacco: Never Used  . Alcohol use 1.8 oz/week    3 Glasses of wine per week     Comment: wine weekly  . Drug use: No  . Sexual activity: Not Asked   Other Topics Concern  . None   Social History Narrative   Work or School: none      Home Situation: lives with husband      Spiritual Beliefs: Christian      Lifestyle:no regular exercise; diet so so              Current Outpatient Prescriptions:  .  Cholecalciferol (VITAMIN D) 1000 UNITS  capsule, Take 1,000 Units by mouth daily.  , Disp: , Rfl:  .  Cyanocobalamin (VITAMIN B-12 PO), Take by mouth daily., Disp: , Rfl:  .  docusate sodium (COLACE) 100 MG capsule, Take 100 mg by mouth every evening. , Disp: , Rfl:  .  edoxaban (SAVAYSA) 60 MG TABS tablet, Take 60 mg by mouth daily., Disp: 30 tablet, Rfl: 11 .  estradiol (VIVELLE-DOT) 0.05 MG/24HR, Place 1 patch onto the skin 2 (two) times a week.  , Disp: , Rfl:  .  levothyroxine (SYNTHROID, LEVOTHROID) 88 MCG tablet, Take 1 tablet (88 mcg total) by mouth daily before breakfast. On empty stomach 1 hour before other meds and food., Disp: 90 tablet, Rfl: 1 .  levothyroxine (SYNTHROID, LEVOTHROID) 88 MCG tablet, TAKE 88MCG BY MOUTH 2 DAYS PER WEEK, TAKE 75 MCG ON ALL OTHER DAYS, Disp: 90 tablet, Rfl: 1 .  Multiple Vitamins-Minerals (MULTIVITAMIN WITH MINERALS) tablet, Take 1 tablet by mouth daily.  , Disp: , Rfl:  .  psyllium (METAMUCIL) 58.6 % powder, Take 1 packet by mouth daily as needed (fiber). , Disp: , Rfl:  .  verapamil (CALAN-SR) 120 MG CR tablet, TAKE ONE TABLET BY MOUTH ONCE DAILY AS NEEDED FOR  HEART  PALPITATIONS, Disp: 30 tablet, Rfl: 6  EXAM:  Vitals:   05/30/17 0938  BP: 122/64  Pulse: 79  Temp: 98.6 F (37 C)    Body mass index is 26.09 kg/m.  GENERAL: vitals reviewed and listed above, alert, oriented, appears well hydrated and in no acute distress  HEENT: atraumatic, conjunttiva clear, no obvious abnormalities on inspection of external nose and ears  NECK: no obvious masses on inspection  LUNGS: clear to auscultation bilaterally, no wheezes, rales or rhonchi, good air movement  CV: HRRR, no peripheral edema  MS: moves all extremities without noticeable abnormality  PSYCH: pleasant and cooperative, no obvious depression or anxiety  ASSESSMENT AND PLAN:  Discussed the following assessment and plan:  Hypothyroidism, unspecified type - Plan: TSH  Paroxysmal atrial fibrillation ([place]) - Plan: Basic  metabolic panel, CBC  Hyperlipidemia, unspecified hyperlipidemia type  -labs, lifestyle recs, she reminds me she does not want to monitor cholesterol any longer -flu shot -follow up 4-6 months -Patient advised to return or notify a doctor immediately if symptoms worsen or persist or new concerns arise.  Patient Instructions  BEFORE YOU LEAVE: -flu shot -follow up: 4 months in January (after 9th) for AWV with susan and follow up with Dr. Maudie Mercury - same day -labs  We have ordered labs or studies at this visit. It can take up to 1-2 weeks for results and processing. IF results require follow up  or explanation, we will call you with instructions. Clinically stable results will be released to your [facility]. If you have not heard from [place] or cannot find your results in [facility] in 2 weeks please contact our office at [phone].  If you are not yet signed up for [facility], please consider signing up.  Advise regular aerobic exercise (at least 150 minutes per week of sweaty exercise) and a healthy diet. Try to eat at least 5-9 servings of vegetables and fruits per day (not corn, potatoes or bananas.) Avoid sweets, red meat, pork, butter, fried foods, fast food, processed food, excessive dairy, eggs and coconut. Replace bad fats with good fats - fish, nuts and seeds, canola oil, olive oil.   WE NOW OFFER   [facility] FAST TRACK!!!  SAME DAY Appointments for ACUTE CARE  Such as: Sprains, Injuries, cuts, abrasions, rashes, muscle pain, joint pain, back pain Colds, flu, sore throats, headache, allergies, cough, fever  Ear pain, sinus and eye infections Abdominal pain, nausea, vomiting, diarrhea, upset stomach Animal/insect bites  3 Easy Ways to Schedule: Walk-In Scheduling Call in scheduling Mychart Sign-up: [email or web address]                 Colin Benton R., DO

## 2017-05-30 ENCOUNTER — Other Ambulatory Visit: Payer: Self-pay | Admitting: Family Medicine

## 2017-05-30 ENCOUNTER — Ambulatory Visit (INDEPENDENT_AMBULATORY_CARE_PROVIDER_SITE_OTHER): Payer: Medicare Other | Admitting: Family Medicine

## 2017-05-30 ENCOUNTER — Encounter: Payer: Self-pay | Admitting: Family Medicine

## 2017-05-30 VITALS — BP 122/64 | HR 79 | Temp 98.6°F | Ht 62.25 in | Wt 143.8 lb

## 2017-05-30 DIAGNOSIS — E785 Hyperlipidemia, unspecified: Secondary | ICD-10-CM | POA: Diagnosis not present

## 2017-05-30 DIAGNOSIS — Z23 Encounter for immunization: Secondary | ICD-10-CM

## 2017-05-30 DIAGNOSIS — E039 Hypothyroidism, unspecified: Secondary | ICD-10-CM

## 2017-05-30 DIAGNOSIS — I480 Paroxysmal atrial fibrillation: Secondary | ICD-10-CM

## 2017-05-30 LAB — BASIC METABOLIC PANEL
BUN: 10 mg/dL (ref 6–23)
CO2: 29 mEq/L (ref 19–32)
Calcium: 9.2 mg/dL (ref 8.4–10.5)
Chloride: 99 mEq/L (ref 96–112)
Creatinine, Ser: 0.74 mg/dL (ref 0.40–1.20)
GFR: 80.16 mL/min (ref >60.00)
Glucose, Bld: 86 mg/dL (ref 70–99)
POTASSIUM: 4.5 meq/L (ref 3.5–5.1)
SODIUM: 137 meq/L (ref 135–145)

## 2017-05-30 LAB — TSH: TSH: 0.24 u[IU]/mL — ABNORMAL LOW (ref 0.35–4.50)

## 2017-05-30 LAB — CBC
HCT: 41.4 % (ref 36.0–46.0)
Hemoglobin: 13.9 g/dL (ref 12.0–15.0)
MCHC: 33.7 g/dL (ref 30.0–36.0)
MCV: 95.8 fl (ref 78.0–100.0)
Platelets: 236 10*3/uL (ref 150.0–400.0)
RBC: 4.33 Mil/uL (ref 3.87–5.11)
RDW: 13.1 % (ref 11.5–15.5)
WBC: 7.5 10*3/uL (ref 4.0–10.5)

## 2017-05-30 MED ORDER — LEVOTHYROXINE SODIUM 75 MCG PO TABS: ORAL_TABLET | ORAL | 3 refills | Status: DC

## 2017-05-30 NOTE — Patient Instructions (Addendum)
BEFORE YOU LEAVE: -flu shot -follow up: 4 months in January (after 9th) for AWV with susan and follow up with Dr. Maudie Mercury - same day -labs  We have ordered labs or studies at this visit. It can take up to 1-2 weeks for results and processing. IF results require follow up or explanation, we will call you with instructions. Clinically stable results will be released to your [facility]. If you have not heard from [place] or cannot find your results in [facility] in 2 weeks please contact our office at [phone].  If you are not yet signed up for [facility], please consider signing up.  Advise regular aerobic exercise (at least 150 minutes per week of sweaty exercise) and a healthy diet. Try to eat at least 5-9 servings of vegetables and fruits per day (not corn, potatoes or bananas.) Avoid sweets, red meat, pork, butter, fried foods, fast food, processed food, excessive dairy, eggs and coconut. Replace bad fats with good fats - fish, nuts and seeds, canola oil, olive oil.   WE NOW OFFER   [facility] FAST TRACK!!!  SAME DAY Appointments for ACUTE CARE  Such as: Sprains, Injuries, cuts, abrasions, rashes, muscle pain, joint pain, back pain Colds, flu, sore throats, headache, allergies, cough, fever  Ear pain, sinus and eye infections Abdominal pain, nausea, vomiting, diarrhea, upset stomach Animal/insect bites  3 Easy Ways to Schedule: Walk-In Scheduling Call in scheduling Mychart Sign-up: [email or web address]

## 2017-05-30 NOTE — Telephone Encounter (Signed)
Manual transmission received and ECGs printed for review by Dr. Rayann Heman.

## 2017-05-30 NOTE — Addendum Note (Signed)
Addended by: Lahoma Crocker A on: 05/30/2017 10:20 AM   Modules accepted: Orders

## 2017-05-30 NOTE — Telephone Encounter (Signed)
LMOM requesting manual Carelink transmission for review.  [facility] phone number for questions/concerns.  Received alert for 39 tachy episodes, available ECGs show SVT, similar to previous ECGs reviewed by Dr. Rayann Heman.  Will determine if patient is symptomatic with episodes and taking PRN verapamil as instructed.

## 2017-05-30 NOTE — Addendum Note (Signed)
Addended by: Agnes Lawrence on: 05/30/2017 05:21 PM   Modules accepted: Orders

## 2017-05-30 NOTE — Telephone Encounter (Signed)
Spoke w/ pt and instructed her how to send a manual transmission w/ her home monitor. Informed her that if the reading is abnormal a Chief Operating Officer will call her back. Otherwise, no news is good news. Pt verbalized understanding.

## 2017-05-31 ENCOUNTER — Other Ambulatory Visit: Payer: Self-pay | Admitting: Family Medicine

## 2017-06-02 NOTE — Telephone Encounter (Signed)
Spoke with patient, advised that Dr. Rayann Heman recommended f/u with him next available.  Patient is agreeable to appointment on 06/21/17 at 11:15am.  She is aware she should also keep her wound check appointment on 06/05/17 at 12:00pm.  Patient used symptom activator yesterday, 06/01/17.  Symptom episode correlates with tachy episode.  Patient reports she was shopping and felt tachy palpitations, then progressively felt more lightheaded.  Her symptoms resolved shortly thereafter.  Per LINQ, SVT episode was 66min 7sec.  Patient reports she is taking her verapamil as prescribed.  Advised patient that she should contact us or seek emergency medical attention for any new or worsening symptoms prior to her appointment.  Patient verbalizes understanding and appreciation.  She denies additional questions or concerns at this time.

## 2017-06-05 ENCOUNTER — Ambulatory Visit (INDEPENDENT_AMBULATORY_CARE_PROVIDER_SITE_OTHER): Payer: Self-pay | Admitting: *Deleted

## 2017-06-05 DIAGNOSIS — I480 Paroxysmal atrial fibrillation: Secondary | ICD-10-CM

## 2017-06-05 LAB — CUP PACEART INCLINIC DEVICE CHECK
Date Time Interrogation Session: 20180917134334
Implantable Pulse Generator Implant Date: 20180905

## 2017-06-05 NOTE — Progress Notes (Signed)
Wound Loop check in clinic. Steri-Strips removed. Wound well healed. Incision edges approximated. Healing stages of bruising noted. Noted that pt c/o of site being sore~ irritation from pts bra. Asked pt to wear a wireless bra and keep some gauze over device site to help with irritation, pt stated that she would. Pt educated to call if site becomes red, swollen or drainage. Battery status: Good. R-waves 0.63mV. 1 symptom episode ~ previously addressed in phone note.  102 tachy episodes, Pause and brady off at implant. 0 AF episodes. Pt stated that most days she has a lightheadedness episode but it usually passes when she sits down. Pt stated that exercise seems to exacerbate the symptoms. ECG of Tachy episodes indicate SVT. Pt stated the worst episode was one she pressed the symptom activator on 9/13. Symptom episode and tachy episodes discussed w/ JA per phone note, pt to f/u with JA on 06/21/2017 to discuss episodes.

## 2017-06-08 ENCOUNTER — Encounter: Payer: Self-pay | Admitting: Family Medicine

## 2017-06-13 ENCOUNTER — Other Ambulatory Visit: Payer: Self-pay | Admitting: Internal Medicine

## 2017-06-21 ENCOUNTER — Ambulatory Visit (INDEPENDENT_AMBULATORY_CARE_PROVIDER_SITE_OTHER): Payer: Medicare Other | Admitting: Internal Medicine

## 2017-06-21 ENCOUNTER — Encounter: Payer: Self-pay | Admitting: Internal Medicine

## 2017-06-21 VITALS — BP 126/72 | HR 91 | Ht 62.25 in | Wt 143.8 lb

## 2017-06-21 DIAGNOSIS — I471 Supraventricular tachycardia: Secondary | ICD-10-CM | POA: Diagnosis not present

## 2017-06-21 DIAGNOSIS — I480 Paroxysmal atrial fibrillation: Secondary | ICD-10-CM

## 2017-06-21 NOTE — Progress Notes (Signed)
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  Since last being seen in our clinic, the patient reports doing reasonably well.  She continues to have abrupt onset/ offset tachypalpitations.  This is worse with activity.  Today, she denies symptoms of palpitations, chest pain, shortness of breath,  lower extremity edema, dizziness, presyncope, or syncope.  The patient is otherwise without complaint today.   Past Medical History:  Diagnosis Date  . Acquired renal cyst of right kidney   . Anxiety   . Atrial fibrillation and flutter ([place])    s/p PVI and CTI by Dr Rayann Heman 04/2013  . Blood transfusion   . Cataract   . CHEST WALL PAIN, HX OF 07/28/2007   Qualifier: Diagnosis of  By: Julien Girt CMA, [name]    . Diverticulosis of colon   . DJD (degenerative joint disease) 10/18/2011  . Erysipelas   . Fibromyalgia   . GERD (gastroesophageal reflux disease)   . Hiatal hernia   . Hot flashes - on low dose HRT with gynecologist, Dr. Nori Riis 03/31/2014  . Hx of colonic polyps   . Hyperlipidemia   . MITRAL VALVE PROLAPSE 07/28/2007   Qualifier: Diagnosis of  By: Julien Girt CMA, [name]    . Supraventricular tachycardia ([place])    AV nodal reentry  s/p RFCA 2001  . Thyroid disease    Past Surgical History:  Procedure Laterality Date  . ABDOMINAL HYSTERECTOMY    . ATRIAL FIBRILLATION ABLATION N/A 04/30/2013   Procedure: ATRIAL FIBRILLATION ABLATION;  Surgeon: [name], MD;  Location: [facility] CATH LAB;  Service: Cardiovascular;  Laterality: N/A;PVI and CTI by Dr Rayann Heman   . BREAST BIOPSY Left    benign  . BREAST BIOPSY  2011   benign  . CATARACT EXTRACTION BILATERAL W/ ANTERIOR VITRECTOMY    . COLONOSCOPY    . COLONOSCOPY    . ESOPHAGOGASTRODUODENOSCOPY    . KNEE ARTHROSCOPY  2012  . LOOP RECORDER INSERTION N/A 05/24/2017   Procedure: LOOP RECORDER INSERTION;  Surgeon: [name], MD;  Location: [place] CV  LAB;  Service: Cardiovascular;  Laterality: N/A;  . OOPHORECTOMY    . ROTATOR CUFF REPAIR  08/2001   Dr. Gladstone Lighter  . TEE WITHOUT CARDIOVERSION N/A 04/29/2013   Procedure: TRANSESOPHAGEAL ECHOCARDIOGRAM (TEE);  Surgeon: Jettie Booze, MD;  Location: [place];  Service: Cardiovascular;  Laterality: N/A;  . UPPER GASTROINTESTINAL ENDOSCOPY      ROS- all systems are reviewed and negatives except as per HPI above  Current Outpatient Prescriptions  Medication Sig Dispense Refill  . Cholecalciferol (VITAMIN D) 1000 UNITS capsule Take 1,000 Units by mouth daily.      . Cyanocobalamin (VITAMIN B-12 PO) Take by mouth daily.    [name] Kitchen docusate sodium (COLACE) 100 MG capsule Take 100 mg by mouth every evening.     . Doxycycline Hyclate (DOXY-CAPS PO) Take 50 mg by mouth 2 (two) times daily.    [name] Kitchen edoxaban (SAVAYSA) 60 MG TABS tablet Take 60 mg by mouth daily. 30 tablet 11  . estradiol (VIVELLE-DOT) 0.05 MG/24HR Place 1 patch onto the skin 2 (two) times a week.      . levothyroxine (SYNTHROID, LEVOTHROID) 75 MCG tablet Take 1 tablet 4 days a week 16 tablet 3  . levothyroxine (SYNTHROID, LEVOTHROID) 88 MCG tablet TAKE 88MCG BY MOUTH 2 DAYS PER WEEK AND TAKE 75MCG ON ALL OTHER DAYS 90  tablet 1  . Multiple Vitamins-Minerals (MULTIVITAMIN WITH MINERALS) tablet Take 1 tablet by mouth daily.      . psyllium (METAMUCIL) 58.6 % powder Take 1 packet by mouth daily as needed (fiber).     . verapamil (CALAN-SR) 120 MG CR tablet TAKE ONE TABLET BY MOUTH ONCE DAILY AS NEEDED FOR  HEART  PALPITATIONS 30 tablet 6   No current facility-administered medications for this visit.     Physical Exam: Vitals:   06/21/17 1125  BP: 126/72  Pulse: 91  SpO2: 99%  Weight: 143 lb 12.8 oz (65.2 kg)  Height: 5' 2.25" (1.581 m)    GEN- The patient is well appearing, alert and oriented x 3 today.   Head- normocephalic, atraumatic Eyes-  Sclera clear, conjunctiva pink Ears- hearing intact Oropharynx- clear Lungs-  Clear to ausculation bilaterally, normal work of breathing Heart- Regular rate and rhythm, no murmurs, rubs or gallops, PMI not laterally displaced GI- soft, NT, ND, + BS Extremities- no clubbing, cyanosis, or edema  ILR interrogation is personally reviewed and shows multiple episodes of SVT  EPS 04/30/13 is reviewed.  She did have frequent multifocal PACs, multiple atypical atrial flutter circuits (nonsustained), and atrial tachycardia not amenable to ablation also noted.  Assessment and Plan:  1. SVT The patient has symptomatic recurrent SVT documented on ILR. Possibly atach (as per prior EPS).  Appears somewhat short RP by ILR however.  She does not tolerate AAD well. Therapeutic strategies for supraventricular tachycardia including medicine and ablation were discussed in detail with the patient today. Risk, benefits, and alternatives to EP study and radiofrequency ablation were also discussed in detail today. These risks include but are not limited to stroke, bleeding, vascular damage, tamponade, perforation, damage to the heart and other structures, AV block requiring pacemaker, worsening renal function, and death. The patient understands these risk and wishes to proceed.  We will therefore proceed with catheter ablation at the next available time. Carto, Ice, and anesthesia are requested.  2. afib No afib on ILR interrogation chads2vasc score is 3.  She is on [place] MD, [facility] 06/21/2017 11:42 AM

## 2017-06-21 NOTE — Patient Instructions (Addendum)
Medication Instructions:  Your physician recommends that you continue on your current medications as directed. Please refer to the Current Medication list given to you today.   Labwork: Your physician recommends that you return for lab work on 10/24 at [facility] given. BMP/CBC   Testing/Procedures: Your physician has recommended that you have an ablation. Catheter ablation is a medical procedure used to treat some cardiac arrhythmias (irregular heartbeats). During catheter ablation, a long, thin, flexible tube is put into a blood vessel in your groin (upper thigh), or neck. This tube is called an ablation catheter. It is then guided to your heart through the blood vessel. Radio frequency waves destroy small areas of heart tissue where abnormal heartbeats may cause an arrhythmia to start. Please see the instruction sheet given to you today.---07/20/2017  Please arrive at [facility] at 5:30AM. Do not eat or drink after midnight the night prior to the procedure Do not take any medications the morning of the test HOLD Verapamil 48hrs prior to procedure.  Last dose on 10/29. Plan for one night stay Will need someone to drive you home at discharge         Follow-Up: Your physician recommends that you schedule a follow-up appointment in: 4 weeks with Dr. Rayann Heman     Any Other Special Instructions Will Be Listed Below (If Applicable).     If you need a refill on your cardiac medications before your next appointment, please call your pharmacy.

## 2017-06-23 ENCOUNTER — Ambulatory Visit (INDEPENDENT_AMBULATORY_CARE_PROVIDER_SITE_OTHER): Payer: Medicare Other | Admitting: *Deleted

## 2017-06-23 DIAGNOSIS — I480 Paroxysmal atrial fibrillation: Secondary | ICD-10-CM | POA: Diagnosis not present

## 2017-06-27 ENCOUNTER — Telehealth: Payer: Self-pay | Admitting: Internal Medicine

## 2017-06-27 NOTE — Telephone Encounter (Signed)
Spoke with patient and confirmed that home monitor transmissions are automatic - she does not need to come into the office. Patient verbalized understanding.

## 2017-06-27 NOTE — Telephone Encounter (Signed)
Follow Up:     Returning your call,pt said she had to leave home at 1:30.

## 2017-06-27 NOTE — Telephone Encounter (Signed)
New Message  Pt call to speak with device about sending a remote transmission. Please call back to discuss

## 2017-06-28 NOTE — Progress Notes (Signed)
Carelink Summary Report / Loop Recorder 

## 2017-06-29 DIAGNOSIS — L309 Dermatitis, unspecified: Secondary | ICD-10-CM | POA: Diagnosis not present

## 2017-06-29 LAB — CUP PACEART REMOTE DEVICE CHECK
MDC IDC PG IMPLANT DT: 20180905
MDC IDC SESS DTM: 20181011074145

## 2017-07-04 DIAGNOSIS — M25511 Pain in right shoulder: Secondary | ICD-10-CM | POA: Diagnosis not present

## 2017-07-08 LAB — CUP PACEART INCLINIC DEVICE CHECK
MDC IDC PG IMPLANT DT: 20180905
MDC IDC SESS DTM: 20181003153652

## 2017-07-12 ENCOUNTER — Other Ambulatory Visit (INDEPENDENT_AMBULATORY_CARE_PROVIDER_SITE_OTHER): Payer: Medicare Other

## 2017-07-12 ENCOUNTER — Other Ambulatory Visit: Payer: Medicare Other | Admitting: *Deleted

## 2017-07-12 DIAGNOSIS — I471 Supraventricular tachycardia: Secondary | ICD-10-CM

## 2017-07-12 DIAGNOSIS — E039 Hypothyroidism, unspecified: Secondary | ICD-10-CM | POA: Diagnosis not present

## 2017-07-12 LAB — TSH: TSH: 1.47 u[IU]/mL (ref 0.35–4.50)

## 2017-07-12 NOTE — Progress Notes (Signed)
005 

## 2017-07-12 NOTE — Addendum Note (Signed)
Addended by: Tomi Likens on: 07/12/2017 08:25 AM   Modules accepted: Orders

## 2017-07-13 LAB — CBC WITH DIFFERENTIAL/PLATELET
Basophils Absolute: 0 10*3/uL (ref 0.0–0.2)
Basos: 0 %
EOS (ABSOLUTE): 0.2 10*3/uL (ref 0.0–0.4)
Eos: 2 %
HEMATOCRIT: 41.9 % (ref 34.0–46.6)
Hemoglobin: 13.9 g/dL (ref 11.1–15.9)
Immature Grans (Abs): 0.1 10*3/uL (ref 0.0–0.1)
Immature Granulocytes: 1 %
LYMPHS: 18 %
Lymphocytes Absolute: 2.1 10*3/uL (ref 0.7–3.1)
MCH: 31.5 pg (ref 26.6–33.0)
MCHC: 33.2 g/dL (ref 31.5–35.7)
MCV: 95 fL (ref 79–97)
MONOCYTES: 8 %
Monocytes Absolute: 1 10*3/uL — ABNORMAL HIGH (ref 0.1–0.9)
NEUTROS ABS: 8.3 10*3/uL — AB (ref 1.4–7.0)
Neutrophils: 71 %
PLATELETS: 263 10*3/uL (ref 150–379)
RBC: 4.41 x10E6/uL (ref 3.77–5.28)
RDW: 13.9 % (ref 12.3–15.4)
WBC: 11.8 10*3/uL — ABNORMAL HIGH (ref 3.4–10.8)

## 2017-07-13 LAB — BASIC METABOLIC PANEL
BUN / CREAT RATIO: 17 (ref 12–28)
BUN: 13 mg/dL (ref 8–27)
CHLORIDE: 99 mmol/L (ref 96–106)
CO2: 27 mmol/L (ref 20–29)
Calcium: 9.5 mg/dL (ref 8.7–10.3)
Creatinine, Ser: 0.77 mg/dL (ref 0.57–1.00)
GFR calc non Af Amer: 73 mL/min/{1.73_m2} (ref >59)
GFR, EST AFRICAN AMERICAN: 84 mL/min/{1.73_m2} (ref >59)
Glucose: 105 mg/dL — ABNORMAL HIGH (ref 65–99)
POTASSIUM: 4 mmol/L (ref 3.5–5.2)
SODIUM: 142 mmol/L (ref 134–144)

## 2017-07-20 ENCOUNTER — Encounter (HOSPITAL_COMMUNITY): Payer: Self-pay | Admitting: Internal Medicine

## 2017-07-20 ENCOUNTER — Ambulatory Visit (HOSPITAL_COMMUNITY): Payer: Medicare Other | Admitting: Certified Registered Nurse Anesthetist

## 2017-07-20 ENCOUNTER — Ambulatory Visit (HOSPITAL_COMMUNITY)
Admission: RE | Admit: 2017-07-20 | Discharge: 2017-07-20 | Disposition: A | Discharge status: Home / Self Care | Payer: Medicare Other | Source: Ambulatory Visit | Attending: Internal Medicine | Admitting: Internal Medicine

## 2017-07-20 ENCOUNTER — Encounter (HOSPITAL_COMMUNITY)
Admission: RE | Disposition: A | Discharge status: Home / Self Care | Payer: Self-pay | Source: Ambulatory Visit | Attending: Internal Medicine

## 2017-07-20 DIAGNOSIS — K219 Gastro-esophageal reflux disease without esophagitis: Secondary | ICD-10-CM | POA: Diagnosis not present

## 2017-07-20 DIAGNOSIS — M1990 Unspecified osteoarthritis, unspecified site: Secondary | ICD-10-CM | POA: Insufficient documentation

## 2017-07-20 DIAGNOSIS — I491 Atrial premature depolarization: Secondary | ICD-10-CM | POA: Diagnosis not present

## 2017-07-20 DIAGNOSIS — I341 Nonrheumatic mitral (valve) prolapse: Secondary | ICD-10-CM | POA: Insufficient documentation

## 2017-07-20 DIAGNOSIS — Z87891 Personal history of nicotine dependence: Secondary | ICD-10-CM | POA: Diagnosis not present

## 2017-07-20 DIAGNOSIS — I4891 Unspecified atrial fibrillation: Secondary | ICD-10-CM | POA: Diagnosis not present

## 2017-07-20 DIAGNOSIS — E785 Hyperlipidemia, unspecified: Secondary | ICD-10-CM | POA: Diagnosis not present

## 2017-07-20 DIAGNOSIS — I471 Supraventricular tachycardia: Secondary | ICD-10-CM | POA: Diagnosis not present

## 2017-07-20 DIAGNOSIS — E039 Hypothyroidism, unspecified: Secondary | ICD-10-CM | POA: Insufficient documentation

## 2017-07-20 DIAGNOSIS — F419 Anxiety disorder, unspecified: Secondary | ICD-10-CM | POA: Insufficient documentation

## 2017-07-20 DIAGNOSIS — I4892 Unspecified atrial flutter: Secondary | ICD-10-CM | POA: Diagnosis not present

## 2017-07-20 DIAGNOSIS — Z7901 Long term (current) use of anticoagulants: Secondary | ICD-10-CM | POA: Insufficient documentation

## 2017-07-20 DIAGNOSIS — M797 Fibromyalgia: Secondary | ICD-10-CM | POA: Diagnosis not present

## 2017-07-20 HISTORY — PX: SVT ABLATION: EP1225

## 2017-07-20 SURGERY — SVT ABLATION
Anesthesia: Monitor Anesthesia Care

## 2017-07-20 MED ORDER — SODIUM CHLORIDE 0.9 % IV SOLN
INTRAVENOUS | Status: DC | PRN
  Administered 2017-07-20: 07:00:00 via INTRAVENOUS

## 2017-07-20 MED ORDER — SODIUM CHLORIDE 0.9% FLUSH: 3.0000 mL | Freq: Two times a day (BID) | INTRAVENOUS | Status: DC

## 2017-07-20 MED ORDER — MIDAZOLAM HCL 5 MG/5ML IJ SOLN
INTRAMUSCULAR | Status: DC | PRN
  Administered 2017-07-20: 1 mg via INTRAVENOUS

## 2017-07-20 MED ORDER — MEPERIDINE HCL 25 MG/ML IJ SOLN: 6.2500 mg | INTRAMUSCULAR | Status: DC | PRN

## 2017-07-20 MED ORDER — SODIUM CHLORIDE 0.9 % IV SOLN
250.0000 mL | INTRAVENOUS | Status: DC | PRN
Start: 2017-07-20 — End: 2017-07-20

## 2017-07-20 MED ORDER — ISOPROTERENOL HCL 0.2 MG/ML IJ SOLN
INTRAVENOUS | Status: DC | PRN
  Administered 2017-07-20: 4 ug/min via INTRAVENOUS

## 2017-07-20 MED ORDER — HEPARIN SODIUM (PORCINE) 1000 UNIT/ML IJ SOLN
INTRAMUSCULAR | Status: AC
  Filled 2017-07-20: qty 1

## 2017-07-20 MED ORDER — ONDANSETRON HCL 4 MG/2ML IJ SOLN: 4.0000 mg | Freq: Four times a day (QID) | INTRAMUSCULAR | Status: DC | PRN

## 2017-07-20 MED ORDER — BUPIVACAINE HCL (PF) 0.25 % IJ SOLN
INTRAMUSCULAR | Status: AC
  Filled 2017-07-20: qty 30

## 2017-07-20 MED ORDER — SODIUM CHLORIDE 0.9% FLUSH: 3.0000 mL | INTRAVENOUS | Status: DC | PRN

## 2017-07-20 MED ORDER — MIDAZOLAM HCL 2 MG/2ML IJ SOLN: 0.5000 mg | Freq: Once | INTRAMUSCULAR | Status: DC | PRN

## 2017-07-20 MED ORDER — ACETAMINOPHEN 325 MG PO TABS
325.0000 mg | ORAL_TABLET | ORAL | Status: DC | PRN
  Filled 2017-07-20: qty 2

## 2017-07-20 MED ORDER — BUPIVACAINE HCL (PF) 0.25 % IJ SOLN
INTRAMUSCULAR | Status: DC | PRN
  Administered 2017-07-20: 30 mL

## 2017-07-20 MED ORDER — ISOPROTERENOL HCL 0.2 MG/ML IJ SOLN
INTRAMUSCULAR | Status: AC
  Filled 2017-07-20: qty 5

## 2017-07-20 MED ORDER — PROPOFOL 500 MG/50ML IV EMUL
INTRAVENOUS | Status: DC | PRN
  Administered 2017-07-20: 50 ug/kg/min via INTRAVENOUS

## 2017-07-20 MED ORDER — FENTANYL CITRATE (PF) 100 MCG/2ML IJ SOLN
INTRAMUSCULAR | Status: DC | PRN
  Administered 2017-07-20: 25 ug via INTRAVENOUS

## 2017-07-20 MED ORDER — SODIUM CHLORIDE 0.9 % IV SOLN
INTRAVENOUS | Status: DC
  Administered 2017-07-20: 06:00:00 via INTRAVENOUS

## 2017-07-20 MED ORDER — PROMETHAZINE HCL 25 MG/ML IJ SOLN: 6.2500 mg | INTRAMUSCULAR | Status: DC | PRN

## 2017-07-20 SURGICAL SUPPLY — 10 items
BAG SNAP BAND KOVER 36X36 (MISCELLANEOUS) ×2 IMPLANT
BLANKET WARM UNDERBOD FULL ACC (MISCELLANEOUS) ×2 IMPLANT
CATH JOSEPHSON QUAD-ALLRED 6FR (CATHETERS) ×4 IMPLANT
CATH WEBSTER BI DIR CS D-F CRV (CATHETERS) ×2 IMPLANT
PACK EP LATEX FREE (CUSTOM PROCEDURE TRAY) ×1
PACK EP LF (CUSTOM PROCEDURE TRAY) ×1 IMPLANT
PAD DEFIB LIFELINK (PAD) ×2 IMPLANT
PATCH CARTO3 (PAD) ×2 IMPLANT
SHEATH PINNACLE 7F 10CM (SHEATH) ×4 IMPLANT
SHEATH PINNACLE 8F 10CM (SHEATH) ×2 IMPLANT

## 2017-07-20 NOTE — Progress Notes (Signed)
Receive report pt accepted

## 2017-07-20 NOTE — H&P (View-Only) (Signed)
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  Since last being seen in our clinic, the patient reports doing reasonably well.  She continues to have abrupt onset/ offset tachypalpitations.  This is worse with activity.  Today, she denies symptoms of palpitations, chest pain, shortness of breath,  lower extremity edema, dizziness, presyncope, or syncope.  The patient is otherwise without complaint today.   Past Medical History:  Diagnosis Date  . Acquired renal cyst of right kidney   . Anxiety   . Atrial fibrillation and flutter ([place])    s/p PVI and CTI by Dr Rayann Heman 04/2013  . Blood transfusion   . Cataract   . CHEST WALL PAIN, HX OF 07/28/2007   Qualifier: Diagnosis of  By: Julien Girt CMA, [name]    . Diverticulosis of colon   . DJD (degenerative joint disease) 10/18/2011  . Erysipelas   . Fibromyalgia   . GERD (gastroesophageal reflux disease)   . Hiatal hernia   . Hot flashes - on low dose HRT with gynecologist, Dr. Nori Riis 03/31/2014  . Hx of colonic polyps   . Hyperlipidemia   . MITRAL VALVE PROLAPSE 07/28/2007   Qualifier: Diagnosis of  By: Julien Girt CMA, [name]    . Supraventricular tachycardia ([place])    AV nodal reentry  s/p RFCA 2001  . Thyroid disease    Past Surgical History:  Procedure Laterality Date  . ABDOMINAL HYSTERECTOMY    . ATRIAL FIBRILLATION ABLATION N/A 04/30/2013   Procedure: ATRIAL FIBRILLATION ABLATION;  Surgeon: [name], MD;  Location: [facility] CATH LAB;  Service: Cardiovascular;  Laterality: N/A;PVI and CTI by Dr Rayann Heman   . BREAST BIOPSY Left    benign  . BREAST BIOPSY  2011   benign  . CATARACT EXTRACTION BILATERAL W/ ANTERIOR VITRECTOMY    . COLONOSCOPY    . COLONOSCOPY    . ESOPHAGOGASTRODUODENOSCOPY    . KNEE ARTHROSCOPY  2012  . LOOP RECORDER INSERTION N/A 05/24/2017   Procedure: LOOP RECORDER INSERTION;  Surgeon: [name], MD;  Location: [facility]  LAB;  Service: Cardiovascular;  Laterality: N/A;  . OOPHORECTOMY    . ROTATOR CUFF REPAIR  08/2001   Dr. Gladstone Lighter  . TEE WITHOUT CARDIOVERSION N/A 04/29/2013   Procedure: TRANSESOPHAGEAL ECHOCARDIOGRAM (TEE);  Surgeon: Jettie Booze, MD;  Location: [facility];  Service: Cardiovascular;  Laterality: N/A;  . UPPER GASTROINTESTINAL ENDOSCOPY      ROS- all systems are reviewed and negatives except as per HPI above  Current Outpatient Prescriptions  Medication Sig Dispense Refill  . Cholecalciferol (VITAMIN D) 1000 UNITS capsule Take 1,000 Units by mouth daily.      . Cyanocobalamin (VITAMIN B-12 PO) Take by mouth daily.    [name] Kitchen docusate sodium (COLACE) 100 MG capsule Take 100 mg by mouth every evening.     . Doxycycline Hyclate (DOXY-CAPS PO) Take 50 mg by mouth 2 (two) times daily.    [name] Kitchen edoxaban (SAVAYSA) 60 MG TABS tablet Take 60 mg by mouth daily. 30 tablet 11  . estradiol (VIVELLE-DOT) 0.05 MG/24HR Place 1 patch onto the skin 2 (two) times a week.      . levothyroxine (SYNTHROID, LEVOTHROID) 75 MCG tablet Take 1 tablet 4 days a week 16 tablet 3  . levothyroxine (SYNTHROID, LEVOTHROID) 88 MCG tablet TAKE 88MCG BY MOUTH 2 DAYS PER WEEK AND TAKE 75MCG ON ALL OTHER DAYS 90  tablet 1  . Multiple Vitamins-Minerals (MULTIVITAMIN WITH MINERALS) tablet Take 1 tablet by mouth daily.      . psyllium (METAMUCIL) 58.6 % powder Take 1 packet by mouth daily as needed (fiber).     . verapamil (CALAN-SR) 120 MG CR tablet TAKE ONE TABLET BY MOUTH ONCE DAILY AS NEEDED FOR  HEART  PALPITATIONS 30 tablet 6   No current facility-administered medications for this visit.     Physical Exam: Vitals:   06/21/17 1125  BP: 126/72  Pulse: 91  SpO2: 99%  Weight: 143 lb 12.8 oz (65.2 kg)  Height: 5' 2.25" (1.581 m)    GEN- The patient is well appearing, alert and oriented x 3 today.   Head- normocephalic, atraumatic Eyes-  Sclera clear, conjunctiva pink Ears- hearing intact Oropharynx- clear Lungs-  Clear to ausculation bilaterally, normal work of breathing Heart- Regular rate and rhythm, no murmurs, rubs or gallops, PMI not laterally displaced GI- soft, NT, ND, + BS Extremities- no clubbing, cyanosis, or edema  ILR interrogation is personally reviewed and shows multiple episodes of SVT  EPS 04/30/13 is reviewed.  She did have frequent multifocal PACs, multiple atypical atrial flutter circuits (nonsustained), and atrial tachycardia not amenable to ablation also noted.  Assessment and Plan:  1. SVT The patient has symptomatic recurrent SVT documented on ILR. Possibly atach (as per prior EPS).  Appears somewhat short RP by ILR however.  She does not tolerate AAD well. Therapeutic strategies for supraventricular tachycardia including medicine and ablation were discussed in detail with the patient today. Risk, benefits, and alternatives to EP study and radiofrequency ablation were also discussed in detail today. These risks include but are not limited to stroke, bleeding, vascular damage, tamponade, perforation, damage to the heart and other structures, AV block requiring pacemaker, worsening renal function, and death. The patient understands these risk and wishes to proceed.  We will therefore proceed with catheter ablation at the next available time. Carto, Ice, and anesthesia are requested.  2. afib No afib on ILR interrogation chads2vasc score is 3.  She is on [place] MD, [facility] 06/21/2017 11:42 AM

## 2017-07-20 NOTE — Interval H&P Note (Signed)
History and Physical Interval Note:  07/20/2017 7:25 AM  Marissa Hart  has presented today for surgery, with the diagnosis of svt  The various methods of treatment have been discussed with the patient and family. After consideration of risks, benefits and other options for treatment, the patient has consented to  Procedure(s): SVT ABLATION (N/A) as a surgical intervention .  The patient's history has been reviewed, patient examined, no change in status, stable for surgery.  I have reviewed the patient's chart and labs.  Questions were answered to the patient's satisfaction.     [name]

## 2017-07-20 NOTE — Anesthesia Postprocedure Evaluation (Signed)
Anesthesia Post Note  Patient: Marissa Hart  Procedure(s) Performed: SVT ABLATION (N/A )     Patient location during evaluation: Cath Lab Anesthesia Type: MAC Level of consciousness: awake and alert, patient cooperative and oriented Pain management: pain level controlled Vital Signs Assessment: post-procedure vital signs reviewed and stable Respiratory status: spontaneous breathing, nonlabored ventilation and respiratory function stable Cardiovascular status: blood pressure returned to baseline and stable Postop Assessment: no apparent nausea or vomiting Anesthetic complications: no    Last Vitals:  Vitals:   07/20/17 1130 07/20/17 1200  BP: (!) 150/56 (!) 147/42  Pulse: 73 70  Resp:    Temp:    SpO2: 99% 96%    Last Pain:  Vitals:   07/20/17 1003  TempSrc: Temporal                 Aneesa Romey,E. Traniya Prichett

## 2017-07-20 NOTE — Anesthesia Procedure Notes (Signed)
Procedure Name: MAC Date/Time: 07/20/2017 8:18 AM Performed by: Carney Living Pre-anesthesia Checklist: Patient identified, Suction available, Emergency Drugs available, Patient being monitored and Timeout performed Patient Re-evaluated:Patient Re-evaluated prior to induction Oxygen Delivery Method: Simple face mask Preoxygenation: Pre-oxygenation with 100% oxygen Induction Type: IV induction

## 2017-07-20 NOTE — Progress Notes (Signed)
Site area: 3 right fv sheaths Site Prior to Removal:  Level 0 Pressure Applied For:  20 minutes Manual:   yes Patient Status During Pull:  stable Post Pull Site:  Level  0 Post Pull Instructions Given:  yes Post Pull Pulses Present:  palpable Dressing Applied:  Gauze and tegaderm Bedrest begins @ 1000 Comments:  IV saline locked

## 2017-07-20 NOTE — Discharge Instructions (Signed)
Femoral Site Care Refer to this sheet in the next few weeks. These instructions provide you with information about caring for yourself after your procedure. Your health care provider may also give you more specific instructions. Your treatment has been planned according to current medical practices, but problems sometimes occur. Call your health care provider if you have any problems or questions after your procedure. What can I expect after the procedure? After your procedure, it is typical to have the following:  Bruising at the site that usually fades within 1-2 weeks.  Blood collecting in the tissue (hematoma) that may be painful to the touch. It should usually decrease in size and tenderness within 1-2 weeks.  Follow these instructions at home:  Take medicines only as directed by your health care provider.  You may shower 24-48 hours after the procedure or as directed by your health care provider. Remove the bandage (dressing) and gently wash the site with plain soap and water. Pat the area dry with a clean towel. Do not rub the site, because this may cause bleeding.  Do not take baths, swim, or use a hot tub until your health care provider approves.  Check your insertion site every day for redness, swelling, or drainage.  Do not apply powder or lotion to the site.  Limit use of stairs to twice a day for the first 2-3 days or as directed by your health care provider.  Do not squat for the first 2-3 days or as directed by your health care provider.  Do not lift over 10 lb (4.5 kg) for 5 days after your procedure or as directed by your health care provider.  Ask your health care provider when it is okay to: ? Return to work or school. ? Resume usual physical activities or sports. ? Resume sexual activity.  Do not drive home if you are discharged the same day as the procedure. Have someone else drive you.  You may drive 24 hours after the procedure unless otherwise instructed by  your health care provider.  Do not operate machinery or power tools for 24 hours after the procedure or as directed by your health care provider.  If your procedure was done as an outpatient procedure, which means that you went home the same day as your procedure, a responsible adult should be with you for the first 24 hours after you arrive home.  Keep all follow-up visits as directed by your health care provider. This is important. Contact a health care provider if:  You have a fever.  You have chills.  You have increased bleeding from the site. Hold pressure on the site. Get help right away if:  You have unusual pain at the site.  You have redness, warmth, or swelling at the site.  You have drainage (other than a small amount of blood on the dressing) from the site.  The site is bleeding, and the bleeding does not stop after 30 minutes of holding steady pressure on the site.  Your leg or foot becomes pale, cool, tingly, or numb. This information is not intended to replace advice given to you by your health care provider. Make sure you discuss any questions you have with your health care provider. Document Released: 05/09/2014 Document Revised: 02/11/2016 Document Reviewed: 03/25/2014 Elsevier Interactive Patient Education  2018 Elsevier I      Moderate Conscious Sedation, Adult, Care After These instructions provide you with information about caring for yourself after your procedure. Your health care provider  may also give you more specific instructions. Your treatment has been planned according to current medical practices, but problems sometimes occur. Call your health care provider if you have any problems or questions after your procedure. What can I expect after the procedure? After your procedure, it is common:  To feel sleepy for several hours.  To feel clumsy and have poor balance for several hours.  To have poor judgment for several hours.  To vomit if you eat  too soon.  Follow these instructions at home: For at least 24 hours after the procedure:   Do not: ? Participate in activities where you could fall or become injured. ? Drive. ? Use heavy machinery. ? Drink alcohol. ? Take sleeping pills or medicines that cause drowsiness. ? Make important decisions or sign legal documents. ? Take care of children on your own.  Rest. Eating and drinking  Follow the diet recommended by your health care provider.  If you vomit: ? Drink water, juice, or soup when you can drink without vomiting. ? Make sure you have little or no nausea before eating solid foods. General instructions  Have a responsible adult stay with you until you are awake and alert.  Take over-the-counter and prescription medicines only as told by your health care provider.  If you smoke, do not smoke without supervision.  Keep all follow-up visits as told by your health care provider. This is important. Contact a health care provider if:  You keep feeling nauseous or you keep vomiting.  You feel light-headed.  You develop a rash.  You have a fever. Get help right away if:  You have trouble breathing. This information is not intended to replace advice given to you by your health care provider. Make sure you discuss any questions you have with your health care provider. Document Released: 06/26/2013 Document Revised: 02/08/2016 Document Reviewed: 12/26/2015 Elsevier Interactive Patient Education  2018 [employer].     No driving for 3 days. No lifting over 5 lbs for 1 week. No sexual activity for 1 week. You may return to work in 1 week. Keep procedure site clean & dry. If you notice increased pain, swelling, bleeding or pus, call/return!  You may shower, but no soaking baths/hot tubs/pools for 1 week.

## 2017-07-20 NOTE — Transfer of Care (Signed)
Immediate Anesthesia Transfer of Care Note  Patient: Marissa Hart  Procedure(s) Performed: SVT ABLATION (N/A )  Patient Location: Cath Lab  Anesthesia Type:MAC  Level of Consciousness: awake, alert , oriented and patient cooperative  Airway & Oxygen Therapy: Patient Spontanous Breathing and Patient connected to nasal cannula oxygen  Post-op Assessment: Report given to RN, Post -op Vital signs reviewed and stable and Patient moving all extremities X 4  Post vital signs: Reviewed and stable  Last Vitals:  Vitals:   07/20/17 1030 07/20/17 1045  BP: (!) 164/56 (!) 168/48  Pulse: 66 70  Resp: 13   Temp:    SpO2: 99% 98%    Last Pain:  Vitals:   07/20/17 1003  TempSrc: Temporal         Complications: No apparent anesthesia complications

## 2017-07-20 NOTE — Anesthesia Preprocedure Evaluation (Addendum)
Anesthesia Evaluation  Patient identified by MRN, date of birth, ID band Patient awake    Reviewed: Allergy & Precautions, NPO status , Patient's Chart, lab work & pertinent test results  History of Anesthesia Complications Negative for: history of anesthetic complications  Airway Mallampati: II  TM Distance: >3 FB Neck ROM: Full    Dental  (+) Teeth Intact, Dental Advisory Given, Caps   Pulmonary former smoker (quit 1968),    breath sounds clear to auscultation       Cardiovascular (-) angina+ dysrhythmias (s/p Afib ablation) Atrial Fibrillation and Supra Ventricular Tachycardia  Rhythm:Regular Rate:Normal  '14 ECHO: EF 60-65%, Trivial AI, Mild MR.   Neuro/Psych Anxiety negative neurological ROS     GI/Hepatic Neg liver ROS, GERD  Controlled,  Endo/Other  Hypothyroidism   Renal/GU negative Renal ROS     Musculoskeletal  (+) Arthritis , Osteoarthritis,  Fibromyalgia -  Abdominal   Peds  Hematology negative hematology ROS (+)   Anesthesia Other Findings   Reproductive/Obstetrics                           Anesthesia Physical Anesthesia Plan  ASA: III  Anesthesia Plan: MAC   Post-op Pain Management:    Induction: Intravenous  PONV Risk Score and Plan: 2 and Ondansetron and Dexamethasone  Airway Management Planned: Simple Face Mask and Natural Airway  Additional Equipment:   Intra-op Plan:   Post-operative Plan:   Informed Consent: I have reviewed the patients History and Physical, chart, labs and discussed the procedure including the risks, benefits and alternatives for the proposed anesthesia with the patient or authorized representative who has indicated his/her understanding and acceptance.   Dental advisory given  Plan Discussed with: CRNA, Anesthesiologist and Surgeon  Anesthesia Plan Comments: (Plan routine monitors, MAC)       Anesthesia Quick Evaluation

## 2017-07-20 NOTE — Progress Notes (Signed)
When pt stood up to get dressed, R groin began to bleed. Pressure applied for 15 min. New dressing: clean, dry, intact. Dr. Rayann Heman added 2 hours to bedrest. D/C time is now 1600.

## 2017-07-21 ENCOUNTER — Telehealth: Payer: Self-pay | Admitting: *Deleted

## 2017-07-21 NOTE — Telephone Encounter (Signed)
Spoke with patient regarding sending manual transmission to get Full Report s/p SVT ablation 07/20/17. Manual transmission successful. Device was cleared on 07/20/17, 2 AF episodes--no ECGs available. Follow-up scheduled with Dr. Rayann Heman 08/14/17 at 1:45.

## 2017-07-24 ENCOUNTER — Ambulatory Visit (INDEPENDENT_AMBULATORY_CARE_PROVIDER_SITE_OTHER): Payer: Medicare Other | Admitting: *Deleted

## 2017-07-24 DIAGNOSIS — I480 Paroxysmal atrial fibrillation: Secondary | ICD-10-CM

## 2017-07-25 ENCOUNTER — Other Ambulatory Visit: Payer: Self-pay | Admitting: Internal Medicine

## 2017-07-25 NOTE — Progress Notes (Signed)
Carelink Summary Report / Loop Recorder 

## 2017-07-26 ENCOUNTER — Other Ambulatory Visit: Payer: Self-pay | Admitting: Internal Medicine

## 2017-07-27 LAB — CUP PACEART REMOTE DEVICE CHECK
Date Time Interrogation Session: 20181104224025
MDC IDC PG IMPLANT DT: 20180905

## 2017-08-14 ENCOUNTER — Ambulatory Visit: Payer: Medicare Other | Admitting: Internal Medicine

## 2017-08-14 ENCOUNTER — Encounter: Payer: Self-pay | Admitting: Internal Medicine

## 2017-08-14 VITALS — BP 138/80 | HR 71 | Ht 62.5 in | Wt 146.4 lb

## 2017-08-14 DIAGNOSIS — I480 Paroxysmal atrial fibrillation: Secondary | ICD-10-CM

## 2017-08-14 DIAGNOSIS — I471 Supraventricular tachycardia: Secondary | ICD-10-CM | POA: Diagnosis not present

## 2017-08-14 LAB — CUP PACEART INCLINIC DEVICE CHECK
Implantable Pulse Generator Implant Date: 20180905
MDC IDC SESS DTM: 20181126143535

## 2017-08-14 MED ORDER — VERAPAMIL HCL ER 120 MG PO TBCR: 120.0000 mg | EXTENDED_RELEASE_TABLET | Freq: Every day | ORAL | 3 refills | Status: DC

## 2017-08-14 NOTE — Progress Notes (Signed)
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 female who presents today for routine electrophysiology followup.  Since her recent EPS, the patient reports doing very well.  Denies complications.  Feels that her SVT is well controlled.  Today, she denies symptoms of palpitations, chest pain, shortness of breath,  lower extremity edema, dizziness, presyncope, or syncope.  The patient is otherwise without complaint today.   Past Medical History:  Diagnosis Date  . Acquired renal cyst of right kidney   . Anxiety   . Atrial fibrillation and flutter ([name])    s/p PVI and CTI by Dr Rayann Heman 04/2013  . Blood transfusion   . Cataract   . CHEST WALL PAIN, HX OF 07/28/2007   Qualifier: Diagnosis of  By: Julien Girt CMA, [name]    . Diverticulosis of colon   . DJD (degenerative joint disease) 10/18/2011  . Erysipelas   . Fibromyalgia   . GERD (gastroesophageal reflux disease)   . Hiatal hernia   . Hot flashes - on low dose HRT with gynecologist, Dr. Nori Riis 03/31/2014  . Hx of colonic polyps   . Hyperlipidemia   . MITRAL VALVE PROLAPSE 07/28/2007   Qualifier: Diagnosis of  By: Julien Girt CMA, [name]    . Supraventricular tachycardia ([name])    AV nodal reentry  s/p RFCA 2001  . Thyroid disease    Past Surgical History:  Procedure Laterality Date  . ABDOMINAL HYSTERECTOMY    . ATRIAL FIBRILLATION ABLATION N/A 04/30/2013   Procedure: ATRIAL FIBRILLATION ABLATION;  Surgeon: [name], MD;  Location: [facility] CATH LAB;  Service: Cardiovascular;  Laterality: N/A;PVI and CTI by Dr Rayann Heman   . BREAST BIOPSY Left    benign  . BREAST BIOPSY  2011   benign  . CATARACT EXTRACTION BILATERAL W/ ANTERIOR VITRECTOMY    . COLONOSCOPY    . COLONOSCOPY    . ESOPHAGOGASTRODUODENOSCOPY    . KNEE ARTHROSCOPY  2012  . LOOP RECORDER INSERTION N/A 05/24/2017   Procedure: LOOP RECORDER INSERTION;  Surgeon: [name], MD;  Location: [place] CV LAB;  Service: Cardiovascular;  Laterality: N/A;  .  OOPHORECTOMY    . ROTATOR CUFF REPAIR  08/2001   Dr. Gladstone Lighter  . SVT ABLATION N/A 07/20/2017   Procedure: SVT ABLATION;  Surgeon: [name], MD;  Location: [place] CV LAB;  Service: Cardiovascular;  Laterality: N/A;  . TEE WITHOUT CARDIOVERSION N/A 04/29/2013   Procedure: TRANSESOPHAGEAL ECHOCARDIOGRAM (TEE);  Surgeon: Jettie Booze, MD;  Location: [place];  Service: Cardiovascular;  Laterality: N/A;  . UPPER GASTROINTESTINAL ENDOSCOPY      ROS- all systems are reviewed and negatives except as per HPI above  Current Outpatient Medications  Medication Sig Dispense Refill  . acetaminophen (TYLENOL) 500 MG tablet Take 500 mg by mouth daily as needed for moderate pain or headache.    . Cholecalciferol (VITAMIN D) 1000 UNITS capsule Take 1,000 Units by mouth daily.      . Cyanocobalamin (B-12) 5000 MCG SUBL Place 5,000 mcg under the tongue daily.    [name] Kitchen docusate sodium (COLACE) 100 MG capsule Take 100 mg by mouth every evening.     . Doxycycline Hyclate 50 MG TABS Take 50 mg by mouth daily.    [name] Kitchen edoxaban (SAVAYSA) 60 MG TABS tablet Take 60 mg by mouth daily. 30 tablet 11  . estradiol (VIVELLE-DOT) 0.05 MG/24HR Place 0.5 patches onto the skin 2 (two) times a week. Wed  and Sat    . Hypromellose (ARTIFICIAL TEARS OP) Apply 1 drop to eye daily as needed (dry eyes).    [name] Kitchen levothyroxine (SYNTHROID, LEVOTHROID) 75 MCG tablet Take 1 tablet 4 days a week (Patient taking differently: Take 77 mcg daily on Sun, Mon, Wed, and Fri) 16 tablet 3  . levothyroxine (SYNTHROID, LEVOTHROID) 88 MCG tablet TAKE 88MCG BY MOUTH 2 DAYS PER WEEK AND TAKE 75MCG ON ALL OTHER DAYS (Patient taking differently: Take 52mcg daily on Tues, Thurs, and Sat) 90 tablet 1  . Multiple Vitamins-Minerals (MULTIVITAMIN WITH MINERALS) tablet Take 1 tablet by mouth daily.      . psyllium (METAMUCIL) 58.6 % powder Take 1 packet by mouth daily as needed (fiber).     . tacrolimus (PROTOPIC) 0.1 % ointment Apply 1 application  topically 2 (two) times daily.    . verapamil (CALAN-SR) 120 MG CR tablet TAKE ONE TABLET BY MOUTH ONCE DAILY AS NEEDED FOR  HEART  PALPITATIONS 30 tablet 6   No current facility-administered medications for this visit.     Physical Exam: Vitals:   08/14/17 1348  BP: 138/80  Pulse: 71  SpO2: 99%  Weight: 146 lb 6.4 oz (66.4 kg)  Height: 5' 2.5" (1.588 m)    GEN- The patient is well appearing, alert and oriented x 3 today.   Head- normocephalic, atraumatic Eyes-  Sclera clear, conjunctiva pink Ears- hearing intact Oropharynx- clear Lungs- Clear to ausculation bilaterally, normal work of breathing Heart- Regular rate and rhythm, no murmurs, rubs or gallops, PMI not laterally displaced GI- soft, NT, ND, + BS Extremities- no clubbing, cyanosis, or edema  EKG tracing ordered today is personally reviewed and shows sinus rhythm  Assessment and Plan:  1. SVT Recent EPS reveals multiple atach circuits, not suitable for mapping or ablation She is doing well currently ILR report today is personally reviewed and reveals that her arrhythmia has been quiescent since her EPS with verapamil 120mg  daily She wishes to make no changes today  2. afib Well controlled chads2vasc score is 3.  She is on savaysa  Return in 6 months [name] MD, [facility] 08/14/2017 2:03 PM

## 2017-08-14 NOTE — Patient Instructions (Addendum)
Medication Instructions:  Your physician has recommended you make the following change in your medication:  1) Take your Verapamil 120 mg daily    Labwork: None ordered   Testing/Procedures: None ordered   Follow-Up: Your physician wants you to follow-up in: 6 months with Dr Rayann Heman Dennis Bast will receive a reminder letter in the mail two months in advance. If you don't receive a letter, please call our office to schedule the follow-up appointment.   Any Other Special Instructions Will Be Listed Below (If Applicable).     If you need a refill on your cardiac medications before your next appointment, please call your pharmacy.

## 2017-08-22 ENCOUNTER — Ambulatory Visit (INDEPENDENT_AMBULATORY_CARE_PROVIDER_SITE_OTHER): Payer: Medicare Other | Admitting: *Deleted

## 2017-08-22 DIAGNOSIS — I480 Paroxysmal atrial fibrillation: Secondary | ICD-10-CM | POA: Diagnosis not present

## 2017-08-23 NOTE — Progress Notes (Signed)
Carelink Summary Report / Loop Recorder 

## 2017-09-01 LAB — CUP PACEART REMOTE DEVICE CHECK
Implantable Pulse Generator Implant Date: 20180905
MDC IDC SESS DTM: 20181204224156

## 2017-09-20 ENCOUNTER — Other Ambulatory Visit: Payer: Self-pay | Admitting: Family Medicine

## 2017-09-21 ENCOUNTER — Ambulatory Visit (INDEPENDENT_AMBULATORY_CARE_PROVIDER_SITE_OTHER): Payer: Medicare Other | Admitting: *Deleted

## 2017-09-21 ENCOUNTER — Other Ambulatory Visit: Payer: Self-pay | Admitting: *Deleted

## 2017-09-21 DIAGNOSIS — I480 Paroxysmal atrial fibrillation: Secondary | ICD-10-CM

## 2017-09-22 NOTE — Progress Notes (Signed)
Carelink Summary Report / Loop Recorder 

## 2017-09-26 ENCOUNTER — Ambulatory Visit: Payer: Medicare Other

## 2017-09-28 ENCOUNTER — Encounter: Payer: Self-pay | Admitting: Family Medicine

## 2017-09-29 ENCOUNTER — Ambulatory Visit: Payer: Medicare Other

## 2017-09-29 VITALS — BP 140/80 | HR 72 | Ht 62.0 in | Wt 146.0 lb

## 2017-09-29 DIAGNOSIS — Z0000 Encounter for general adult medical examination without abnormal findings: Secondary | ICD-10-CM

## 2017-09-29 NOTE — Progress Notes (Signed)
Subjective:   Marissa Hart is a 83 y.o. female who presents for Medicare Annual (Subsequent) preventive examination.  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 Maudie Mercury Jan 24th    Diet Chol 209; ratio 5; trig 151; hdl 40 ldl 138 Eats 2 meals a day  10:30 breakfast 5 in the afternoon; with a snack inbetween Tries to eat healthy Does not eat fast foods Chicken and salmon Try to beans and gets frozen vegetables   Exercise Does stretching at least 6 days a week Pedal bike; trying to use this 20 minutes x 3 times a week  Gave up tennis when her spouse became ill.  Has thought about playing again. Misses the social aspects    ETOH wine weekly 3 glasses  Tobacco use, quit in 1968 - 1.5 pack years   There are no preventive care reminders to display for this patient.     Colonoscopy 11/2012 - no more colonoscopy's per Dr. Sharlett Iles Mammogram 12/2016 Dexa was postponed-  Will discuss with GYN apt soon ( Dr. Nori Riis )    Zoster 10/2006;  Educated regarding shingrix      Objective:     Vitals: BP 140/80   Pulse 72   Ht 5\' 2"  (1.575 m)   Wt 146 lb (66.2 kg)   SpO2 95%   BMI 26.70 kg/m   Body mass index is 26.7 kg/m.  Advanced Directives 07/20/2017 05/24/2017 01/24/2017 07/29/2014 04/30/2013 04/30/2013 04/29/2013  Does Patient Have a Medical Advance Directive? Yes Yes Yes Yes Patient has advance directive, copy in  chart Patient has advance directive, copy not in chart Patient has advance directive, copy not in chart  Type of Advance Directive [place];Living will [place];Living will [place];Living will;Out of facility DNR (pink MOST or yellow form) Living will [place];Living will - [place];Living will  Does patient want to make changes to medical advance directive? - No - Patient declined No - Patient declined - - - -  Copy of [place] in Chart? - No - copy requested Yes No - copy requested - - -  Pre-existing out of facility DNR order (yellow form or pink MOST form) - - Yellow form placed in chart (order not valid for inpatient use) - No - -    Tobacco Social History   Tobacco Use  Smoking Status Former Smoker  . Packs/day: 0.50  . Years: 3.00  . Pack years: 1.50  . Types: Cigarettes  . Last attempt to quit: 09/19/1966  . Years since quitting: 51.0  Smokeless Tobacco Never Used     Counseling given: Yes   Clinical Intake:   Past Medical History:  Diagnosis Date  . Acquired renal  cyst of right kidney   . Anxiety   . Atrial fibrillation and flutter ([place])    s/p PVI and CTI by Dr Rayann Heman 04/2013  . Blood transfusion   . Cataract   . CHEST WALL PAIN, HX OF 07/28/2007   Qualifier: Diagnosis of  By: Julien Girt CMA, [name]    . Diverticulosis of colon   . DJD (degenerative joint disease) 10/18/2011  . Erysipelas   . Fibromyalgia   . GERD (gastroesophageal reflux disease)   . Hiatal hernia   . Hot flashes - on low dose HRT with gynecologist, Dr. Nori Riis 03/31/2014  . Hx of colonic polyps   . Hyperlipidemia   . MITRAL VALVE PROLAPSE 07/28/2007   Qualifier: Diagnosis of  By: Julien Girt CMA, [name]    . Supraventricular tachycardia ([place])    AV nodal reentry  s/p RFCA 2001  . Thyroid disease    Past Surgical History:  Procedure Laterality Date  . ABDOMINAL HYSTERECTOMY    . ATRIAL  FIBRILLATION ABLATION N/A 04/30/2013   Procedure: ATRIAL FIBRILLATION ABLATION;  Surgeon: [name], MD;  Location: [facility] CATH LAB;  Service: Cardiovascular;  Laterality: N/A;PVI and CTI by Dr Rayann Heman   . BREAST BIOPSY Left    benign  . BREAST BIOPSY  2011   benign  . CATARACT EXTRACTION BILATERAL W/ ANTERIOR VITRECTOMY    . COLONOSCOPY    . COLONOSCOPY    . ESOPHAGOGASTRODUODENOSCOPY    . KNEE ARTHROSCOPY  2012  . LOOP RECORDER INSERTION N/A 05/24/2017   Procedure: LOOP RECORDER INSERTION;  Surgeon: [name], MD;  Location: [place] CV LAB;  Service: Cardiovascular;  Laterality: N/A;  . OOPHORECTOMY    . ROTATOR CUFF REPAIR  08/2001   Dr. Gladstone Lighter  . SVT ABLATION N/A 07/20/2017   Procedure: SVT ABLATION;  Surgeon: [name], MD;  Location: [place] CV LAB;  Service: Cardiovascular;  Laterality: N/A;  . TEE WITHOUT CARDIOVERSION N/A 04/29/2013   Procedure: TRANSESOPHAGEAL ECHOCARDIOGRAM (TEE);  Surgeon: Jettie Booze, MD;  Location: [place];  Service: Cardiovascular;  Laterality: N/A;  . UPPER GASTROINTESTINAL ENDOSCOPY     No family history on file. Social History   Socioeconomic History  . Marital status: Widowed    Spouse name: Not on file  . Number of children: 1  . Years of education: Not on file  . Highest education level: Not on file  Social Needs  . Financial resource strain: Not on file  . Food insecurity - worry: Not on file  . Food insecurity - inability: Not on file  . Transportation needs - medical: Not on file  . Transportation needs - non-medical: Not on file  Occupational History    Employer: RETIRED  Tobacco Use  . Smoking status: Former Smoker    Packs/day: 0.50    Years: 3.00    Pack years: 1.50    Types: Cigarettes    Last attempt to quit: 09/19/1966    Years since quitting: 51.0  . Smokeless tobacco: Never Used  Substance and Sexual Activity  . Alcohol use: Yes    Alcohol/week: 1.8 oz    Types: 3 Glasses of wine per week     Comment: wine weekly  . Drug use: No  . Sexual activity: Not on file  Other Topics Concern  . Not on file  Social History Narrative   Work or School: none      Home Situation: lives with husband      Spiritual Beliefs: Darrick Meigs  Lifestyle:no regular exercise; diet so so             Outpatient Encounter Medications as of 09/29/2017  Medication Sig  . acetaminophen (TYLENOL) 500 MG tablet Take 500 mg by mouth daily as needed for moderate pain or headache.  . Cholecalciferol (VITAMIN D) 1000 UNITS capsule Take 1,000 Units by mouth daily.    . Cyanocobalamin (B-12) 5000 MCG SUBL Place 5,000 mcg under the tongue daily.  [name] Kitchen docusate sodium (COLACE) 100 MG capsule Take 100 mg by mouth every evening.   . edoxaban (SAVAYSA) 60 MG TABS tablet Take 60 mg by mouth daily.  [name] Kitchen estradiol (VIVELLE-DOT) 0.05 MG/24HR Place 0.5 patches onto the skin 2 (two) times a week. Wed and Sat  . Hypromellose (ARTIFICIAL TEARS OP) Apply 1 drop to eye daily as needed (dry eyes).  [name] Kitchen levothyroxine (SYNTHROID, LEVOTHROID) 75 MCG tablet TAKE 1 TABLET BY MOUTH 4 DAYS PER WEEK  . levothyroxine (SYNTHROID, LEVOTHROID) 88 MCG tablet TAKE 88MCG BY MOUTH 2 DAYS PER WEEK AND TAKE 75MCG ON ALL OTHER DAYS (Patient taking differently: Take 29mcg daily on Tues, Thurs, and Sat)  . Multiple Vitamins-Minerals (MULTIVITAMIN WITH MINERALS) tablet Take 1 tablet by mouth daily.    . psyllium (METAMUCIL) 58.6 % powder Take 1 packet by mouth daily as needed (fiber).   . verapamil (CALAN-SR) 120 MG CR tablet Take 1 tablet (120 mg total) by mouth daily.  . [DISCONTINUED] Doxycycline Hyclate 50 MG TABS Take 50 mg by mouth daily.  . [DISCONTINUED] tacrolimus (PROTOPIC) 0.1 % ointment Apply 1 application topically 2 (two) times daily.   No facility-administered encounter medications on file as of 09/29/2017.     Activities of Daily Living In your present state of health, do you have any difficulty performing the following activities:  07/20/2017 05/24/2017  Hearing? N N  Vision? N N  Difficulty concentrating or making decisions? N N  Walking or climbing stairs? N N  Dressing or bathing? N N  Some recent data might be hidden    Patient Care Team: Lucretia Kern, DO as PCP - General (Family Medicine) Maisie Fus, MD as Consulting Physician (Obstetrics and Gynecology) [name], MD as Consulting Physician (Cardiology)    Assessment:   This is a routine wellness examination for Ginger.  Exercise Activities and Dietary recommendations    Goals    . Patient Stated     Start walking again         Fall Risk Fall Risk  09/27/2016 09/23/2015 07/29/2014 07/29/2014  Falls in the past year? No No No No   Safety in the home   Timed Get Up and Go performed:   Depression Screen PHQ 2/9 Scores 09/27/2016 09/23/2015 07/29/2014 07/29/2014  PHQ - 2 Score 0 1 1 1     Trying to bring some Joy back to her life from losing her spouse Was used to taking care of the financial issues Still misses him   Cognitive Function Ad8 score reviewed for issues:  Issues making decisions:  Less interest in hobbies / activities:  Repeats questions, stories (family complaining):  Trouble using ordinary gadgets (microwave, computer, phone):  Forgets the month or year:   Mismanaging finances:   Remembering appts:  Daily problems with thinking and/or memory: Ad8 score is=0           Immunization History  Administered Date(s) Administered  . H1N1 09/09/2008  . Influenza Split 07/02/2011  . Influenza Whole 07/05/2010, 07/18/2012  . Influenza, High Dose  Seasonal PF 06/09/2015, 06/09/2016, 05/30/2017  . Influenza,inj,Quad PF,6+ Mos 06/17/2013, 06/13/2014  . Pneumococcal Conjugate-13 07/29/2014  . Pneumococcal Polysaccharide-23 07/22/2002  . Tdap 11/30/2011  . Zoster 11/14/2006     Screening Tests Health Maintenance  Topic Date Due  . DEXA SCAN  11/27/2019 (Originally 12/07/2001)  . COLONOSCOPY  12/10/2017  .  TETANUS/TDAP  11/29/2021  . INFLUENZA VACCINE  Completed         Plan:      PCP Notes   Health Maintenance Mammogram every year Dexa followed by Dr. Nori Riis; will discuss this year  Dr. Sharlett Iles noted on colonoscopy report she is no longer a candidate for further screenings unless she has medical issues   Abnormal Screens:  BP was elevated 150/80 but down to 140 /80 Asked to check her BP several times a week and will discuss with Dr. Maudie Mercury when she sees her in 2 weeks.  States there BP is generally lower and will bring her readings   Referrals  None   Patient concerns; Recent difficulty this past summer with Atrial fib but better now Medication is helping  Had some GI issues last week; not sure if this was a virus; no temp Took Kefir (probiotic)because she felt bloated and tender in abd in LLQ Lasted one week; had BM normally  States she was told she has diverticulosis Pharmacist told her to try omeprazole and this did help her  Has been taking x 12 days and will complete 2 more days and let Dr. Maudie Mercury know if her symptoms come back.  Apt in 2 weeks   Nurse Concerns; Declines depression but still grieves over spouse at times.    Next PCP apt 10/12/2017   I have personally reviewed and noted the following in the patient's chart:   . Medical and social history . Use of alcohol, tobacco or illicit drugs  . Current medications and supplements . Functional ability and status . Nutritional status . Physical activity . Advanced directives . List of other physicians . Hospitalizations, surgeries, and ER visits in previous 12 months . Vitals . Screenings to include cognitive, depression, and falls . Referrals and appointments  In addition, I have reviewed and discussed with patient certain preventive protocols, quality metrics, and best practice recommendations. A written personalized care plan for preventive services as well as general preventive health recommendations  were provided to patient.     Wynetta Fines, RN  09/29/2017

## 2017-09-29 NOTE — Patient Instructions (Addendum)
Marissa Hart , Thank you for taking time to come for your Medicare Wellness Visit. I appreciate your ongoing commitment to your health goals. Please review the following plan we discussed and let me know if I can assist you in the future.   To check BP a several days a week at different times and bring reading in to Dr. Maudie Mercury   Do discuss the omeprazole for your abd   Will discuss repeat DEXA (bone density) with Dr. Nori Riis  [facility] [place] - can assist with hearing aid x 1  No reviews  [facility]  [place] [id number]  [phone]  [email or web address]    These are the goals we discussed: Goals    . Patient Stated     Start walking again         This is a list of the screening recommended for you and due dates:  Health Maintenance  Topic Date Due  . DEXA scan (bone density measurement)  11/27/2019*  . Colon Cancer Screening  12/10/2017  . Tetanus Vaccine  11/29/2021  . Flu Shot  Completed  *Topic was postponed. The date shown is not the original due date.    Health Maintenance for Postmenopausal Women Menopause is a normal process in which your reproductive ability comes to an end. This process happens gradually over a span of months to years, usually between the ages of 42 and 90. Menopause is complete when you have missed 12 consecutive menstrual periods. It is important to talk with your health care provider about some of the most common conditions that affect postmenopausal women, such as heart disease, cancer, and bone loss (osteoporosis). Adopting a healthy lifestyle and getting preventive care can help to promote your health and wellness. Those actions can also lower your chances of developing some of these common conditions. What should I know about menopause? During menopause, you may experience a number of symptoms, such  as:  Moderate-to-severe hot flashes.  Night sweats.  Decrease in sex drive.  Mood swings.  Headaches.  Tiredness.  Irritability.  Memory problems.  Insomnia.  Choosing to treat or not to treat menopausal changes is an individual decision that you make with your health care provider. What should I know about hormone replacement therapy and supplements? Hormone therapy products are effective for treating symptoms that are associated with menopause, such as hot flashes and night sweats. Hormone replacement carries certain risks, especially as you become older. If you are thinking about using estrogen or estrogen with progestin treatments, discuss the benefits and risks with your health care provider. What should I know about heart disease and stroke? Heart disease, heart attack, and stroke become more likely as you age. This may be due, in part, to the hormonal changes that your body experiences during menopause. These can affect how your body processes dietary fats, triglycerides, and cholesterol. Heart attack and stroke are both medical emergencies. There are many things that you can do to help prevent heart disease and stroke:  Have your blood pressure checked at least every 1-2 years. High blood pressure causes heart disease and increases the risk of stroke.  If you are 20-40 years old, ask your health care provider if you should take aspirin to prevent a heart attack or a stroke.  Do not use any tobacco products, including cigarettes, chewing tobacco, or electronic cigarettes. If you need help quitting, ask your health care provider.  It is important to eat a  healthy diet and maintain a healthy weight. ? Be sure to include plenty of vegetables, fruits, low-fat dairy products, and lean protein. ? Avoid eating foods that are high in solid fats, added sugars, or salt (sodium).  Get regular exercise. This is one of the most important things that you can do for your health. ? Try  to exercise for at least 150 minutes each week. The type of exercise that you do should increase your heart rate and make you sweat. This is known as moderate-intensity exercise. ? Try to do strengthening exercises at least twice each week. Do these in addition to the moderate-intensity exercise.  Know your numbers.Ask your health care provider to check your cholesterol and your blood glucose. Continue to have your blood tested as directed by your health care provider.  What should I know about cancer screening? There are several types of cancer. Take the following steps to reduce your risk and to catch any cancer development as early as possible. Breast Cancer  Practice breast self-awareness. ? This means understanding how your breasts normally appear and feel. ? It also means doing regular breast self-exams. Let your health care provider know about any changes, no matter how small.  If you are 32 or older, have a clinician do a breast exam (clinical breast exam or CBE) every year. Depending on your age, family history, and medical history, it may be recommended that you also have a yearly breast X-ray (mammogram).  If you have a family history of breast cancer, talk with your health care provider about genetic screening.  If you are at high risk for breast cancer, talk with your health care provider about having an MRI and a mammogram every year.  Breast cancer (BRCA) gene test is recommended for women who have family members with BRCA-related cancers. Results of the assessment will determine the need for genetic counseling and BRCA1 and for BRCA2 testing. BRCA-related cancers include these types: ? Breast. This occurs in males or females. ? Ovarian. ? Tubal. This may also be called fallopian tube cancer. ? Cancer of the abdominal or pelvic lining (peritoneal cancer). ? Prostate. ? Pancreatic.  Cervical, Uterine, and Ovarian Cancer Your health care provider may recommend that you be  screened regularly for cancer of the pelvic organs. These include your ovaries, uterus, and vagina. This screening involves a pelvic exam, which includes checking for microscopic changes to the surface of your cervix (Pap test).  For women ages 21-65, health care providers may recommend a pelvic exam and a Pap test every three years. For women ages 12-65, they may recommend the Pap test and pelvic exam, combined with testing for human papilloma virus (HPV), every five years. Some types of HPV increase your risk of cervical cancer. Testing for HPV may also be done on women of any age who have unclear Pap test results.  Other health care providers may not recommend any screening for nonpregnant women who are considered low risk for pelvic cancer and have no symptoms. Ask your health care provider if a screening pelvic exam is right for you.  If you have had past treatment for cervical cancer or a condition that could lead to cancer, you need Pap tests and screening for cancer for at least 20 years after your treatment. If Pap tests have been discontinued for you, your risk factors (such as having a new sexual partner) need to be reassessed to determine if you should start having screenings again. Some women have medical problems  that increase the chance of getting cervical cancer. In these cases, your health care provider may recommend that you have screening and Pap tests more often.  If you have a family history of uterine cancer or ovarian cancer, talk with your health care provider about genetic screening.  If you have vaginal bleeding after reaching menopause, tell your health care provider.  There are currently no reliable tests available to screen for ovarian cancer.  Lung Cancer Lung cancer screening is recommended for adults 39-56 years old who are at high risk for lung cancer because of a history of smoking. A yearly low-dose CT scan of the lungs is recommended if you:  Currently  smoke.  Have a history of at least 30 pack-years of smoking and you currently smoke or have quit within the past 15 years. A pack-year is smoking an average of one pack of cigarettes per day for one year.  Yearly screening should:  Continue until it has been 15 years since you quit.  Stop if you develop a health problem that would prevent you from having lung cancer treatment.  Colorectal Cancer  This type of cancer can be detected and can often be prevented.  Routine colorectal cancer screening usually begins at age 28 and continues through age 29.  If you have risk factors for colon cancer, your health care provider may recommend that you be screened at an earlier age.  If you have a family history of colorectal cancer, talk with your health care provider about genetic screening.  Your health care provider may also recommend using home test kits to check for hidden blood in your stool.  A small camera at the end of a tube can be used to examine your colon directly (sigmoidoscopy or colonoscopy). This is done to check for the earliest forms of colorectal cancer.  Direct examination of the colon should be repeated every 5-10 years until age 72. However, if early forms of precancerous polyps or small growths are found or if you have a family history or genetic risk for colorectal cancer, you may need to be screened more often.  Skin Cancer  Check your skin from head to toe regularly.  Monitor any moles. Be sure to tell your health care provider: ? About any new moles or changes in moles, especially if there is a change in a mole's shape or color. ? If you have a mole that is larger than the size of a pencil eraser.  If any of your family members has a history of skin cancer, especially at a young age, talk with your health care provider about genetic screening.  Always use sunscreen. Apply sunscreen liberally and repeatedly throughout the day.  Whenever you are outside, protect  yourself by wearing long sleeves, pants, a wide-brimmed hat, and sunglasses.  What should I know about osteoporosis? Osteoporosis is a condition in which bone destruction happens more quickly than new bone creation. After menopause, you may be at an increased risk for osteoporosis. To help prevent osteoporosis or the bone fractures that can happen because of osteoporosis, the following is recommended:  If you are 13-73 years old, get at least 1,000 mg of calcium and at least 600 mg of vitamin D per day.  If you are older than age 89 but younger than age 65, get at least 1,200 mg of calcium and at least 600 mg of vitamin D per day.  If you are older than age 34, get at least 1,200 mg of  calcium and at least 800 mg of vitamin D per day.  Smoking and excessive alcohol intake increase the risk of osteoporosis. Eat foods that are rich in calcium and vitamin D, and do weight-bearing exercises several times each week as directed by your health care provider. What should I know about how menopause affects my mental health? Depression may occur at any age, but it is more common as you become older. Common symptoms of depression include:  Low or sad mood.  Changes in sleep patterns.  Changes in appetite or eating patterns.  Feeling an overall lack of motivation or enjoyment of activities that you previously enjoyed.  Frequent crying spells.  Talk with your health care provider if you think that you are experiencing depression. What should I know about immunizations? It is important that you get and maintain your immunizations. These include:  Tetanus, diphtheria, and pertussis (Tdap) booster vaccine.  Influenza every year before the flu season begins.  Pneumonia vaccine.  Shingles vaccine.  Your health care provider may also recommend other immunizations. This information is not intended to replace advice given to you by your health care provider. Make sure you discuss any questions you  have with your health care provider. Document Released: 10/28/2005 Document Revised: 03/25/2016 Document Reviewed: 06/09/2015 Elsevier Interactive Patient Education  2018 [place] in the Home Falls can cause injuries and can affect people from all age groups. There are many simple things that you can do to make your home safe and to help prevent falls. What can I do on the outside of my home?  Regularly repair the edges of walkways and driveways and fix any cracks.  Remove high doorway thresholds.  Trim any shrubbery on the main path into your home.  Use bright outdoor lighting.  Clear walkways of debris and clutter, including tools and rocks.  Regularly check that handrails are securely fastened and in good repair. Both sides of any steps should have handrails.  Install guardrails along the edges of any raised decks or porches.  Have leaves, snow, and ice cleared regularly.  Use sand or salt on walkways during winter months.  In the garage, clean up any spills right away, including grease or oil spills. What can I do in the bathroom?  Use night lights.  Install grab bars by the toilet and in the tub and shower. Do not use towel bars as grab bars.  Use non-skid mats or decals on the floor of the tub or shower.  If you need to sit down while you are in the shower, use a plastic, non-slip stool.  Keep the floor dry. Immediately clean up any water that spills on the floor.  Remove soap buildup in the tub or shower on a regular basis.  Attach bath mats securely with double-sided non-slip rug tape.  Remove throw rugs and other tripping hazards from the floor. What can I do in the bedroom?  Use night lights.  Make sure that a bedside light is easy to reach.  Do not use oversized bedding that drapes onto the floor.  Have a firm chair that has side arms to use for getting dressed.  Remove throw rugs and other tripping hazards from the  floor. What can I do in the kitchen?  Clean up any spills right away.  Avoid walking on wet floors.  Place frequently used items in easy-to-reach places.  If you need to reach for something above you, use a sturdy step  stool that has a grab bar.  Keep electrical cables out of the way.  Do not use floor polish or wax that makes floors slippery. If you have to use wax, make sure that it is non-skid floor wax.  Remove throw rugs and other tripping hazards from the floor. What can I do in the stairways?  Do not leave any items on the stairs.  Make sure that there are handrails on both sides of the stairs. Fix handrails that are broken or loose. Make sure that handrails are as long as the stairways.  Check any carpeting to make sure that it is firmly attached to the stairs. Fix any carpet that is loose or worn.  Avoid having throw rugs at the top or bottom of stairways, or secure the rugs with carpet tape to prevent them from moving.  Make sure that you have a light switch at the top of the stairs and the bottom of the stairs. If you do not have them, have them installed. What are some other fall prevention tips?  Wear closed-toe shoes that fit well and support your feet. Wear shoes that have rubber soles or low heels.  When you use a stepladder, make sure that it is completely opened and that the sides are firmly locked. Have someone hold the ladder while you are using it. Do not climb a closed stepladder.  Add color or contrast paint or tape to grab bars and handrails in your home. Place contrasting color strips on the first and last steps.  Use mobility aids as needed, such as canes, walkers, scooters, and crutches.  Turn on lights if it is dark. Replace any light bulbs that burn out.  Set up furniture so that there are clear paths. Keep the furniture in the same spot.  Fix any uneven floor surfaces.  Choose a carpet design that does not hide the edge of steps of a  stairway.  Be aware of any and all pets.  Review your medicines with your healthcare provider. Some medicines can cause dizziness or changes in blood pressure, which increase your risk of falling. Talk with your health care provider about other ways that you can decrease your risk of falls. This may include working with a physical therapist or trainer to improve your strength, balance, and endurance. This information is not intended to replace advice given to you by your health care provider. Make sure you discuss any questions you have with your health care provider. Document Released: 08/26/2002 Document Revised: 02/02/2016 Document Reviewed: 10/10/2014 Elsevier Interactive Patient Education  [employer].

## 2017-09-30 NOTE — Progress Notes (Signed)
Marissa Centner R., DO  

## 2017-10-02 ENCOUNTER — Encounter: Payer: Medicare Other | Admitting: Family Medicine

## 2017-10-04 LAB — CUP PACEART REMOTE DEVICE CHECK
Date Time Interrogation Session: 20190103224127
MDC IDC PG IMPLANT DT: 20180905

## 2017-10-10 NOTE — Progress Notes (Signed)
HPI:  Here for CPE:  -Concerns and/or follow up today:  Marissa Hart is Marissa pleasant 83 y.o. here for follow up. Chronic medical problems summarized below were reviewed for changes and stability and were updated as needed below. These issues and their treatment remain stable for the most part.  Overall is doing well.  He had an attempted ablation for her heart.  She is wearing an implanted monitor.  Reports she is actually been doing much better since cardiologist had her take the verapamil daily.  Her blood pressure has tolerated this okay.  Blood pressures ranged in the 1 teens to 120s over 60s-70s at home. Denies CP, palpitations, dizziness, SOB, DOE, treatment intolerance or new symptoms. Sees her gynecologist for breast exams, pelvic exams and bone density tests. Sees dermatologist for skin exams.  AWV with Marissa Hart 09/29/17 Hypothyroidism: -on levothyroxine 16mg daily  -doing well  Hot Flashes: -on vivelle dot -sees gynecologist for this, on very low dose of transdermal estrogen, s/p hysterectomy  Dyslipidemia: -low hdl -she does not want to check further - reports would never take medications for this -doing healthy lifestyle  PAF, atrial flutter, PACs: -followed by cardiology/electrophysiology  -s/p ablation in 2014, attempted in 2018, implanted monitor in 2018 -on edoxaban and now on verapamil daily and doing better  Bereavment: -husband passed - grieving but doing well -anniversaries and holidays are tough -she is in Marissa widows group at church and this has been very helpful, feels is coping well  -Diet: variety of foods, balance and well rounded, larger portion sizes -Exercise: no regular exercise -Taking folic acid, vitamin D or calcium: no -Diabetes and Dyslipidemia Screening: fasting labs due -Vaccines: see vaccine section EPIC -pap history:  -FDLMP: see nursing notes -sexual activity: yes, female partner, no new partners -wants STI testing (Hep C if born  138-65: no -FH breast, colon or ovarian ca: see FH Last mammogram: sees gyn Last colon cancer screening: done, utd Breast Ca Risk Assessment: see family history and pt history DEXA (>/= 62: sees gyn for this per her report  -Alcohol, Tobacco, drug use: see social history  Review of Systems - no fevers, unintentional weight loss, vision loss, hearing loss, chest pain, sob, hemoptysis, melena, hematochezia, hematuria, genital discharge, changing or concerning skin lesions, bleeding, bruising, loc, thoughts of self harm or SI  Past Medical History:  Diagnosis Date  . Acquired renal cyst of right kidney   . Anxiety   . At[place] and [place] ([place]    s/p PVI and CTI by Marissa ARayann Heman8/2014  . Blood transfusion   . Cataract   . CHEST WALL PAIN, HX OF 07/28/2007   Qualifier: Diagnosis of  By: AJulien Hart, Marissa Hart   . Diverticulosis of colon   . DJD (degenerative joint disease) 10/18/2011  . Erysipelas   . Fibromyalgia   . GERD (gastroesophageal reflux disease)   . Hiatal hernia   . Hot flashes - on low dose HRT with gynecologist, Marissa. NNori Riis7/13/2015  . Hx of colonic polyps   . Hyperlipidemia   . MITRAL VALVE PROLAPSE 07/28/2007   Qualifier: Diagnosis of  By: AJulien Hart, Marissa Hart   . [place]cardia ([place]    AV nodal reentry  s/p RFCA 2001  . Thyroid disease     Past Surgical History:  Procedure Laterality Date  . ABDOMINAL HYSTERECTOMY    . ATRIAL FIBRILLATION ABLATION N/Marissa 04/30/2013   Procedure: ATRIAL FIBRILLATION ABLATION;  Surgeon: JThompson Grayer MD;  [facility];  Service: Cardiovascular;  Laterality: N/Marissa;PVI and CTI by Marissa Rayann Hart   . BREAST BIOPSY Left    benign  . BREAST BIOPSY  2011   benign  . CATARACT EXTRACTION BILATERAL W/ ANTERIOR VITRECTOMY    . COLONOSCOPY    . COLONOSCOPY    . ESOPHAGOGASTRODUODENOSCOPY    . KNEE ARTHROSCOPY  2012  . LOOP RECORDER INSERTION N/Marissa 05/24/2017   Procedure: LOOP RECORDER INSERTION;  Surgeon: Marissa Grayer, MD;   Location: [place] CV LAB;  Service: Cardiovascular;  Laterality: N/Marissa;  . OOPHORECTOMY    . ROTATOR CUFF REPAIR  08/2001   Marissa. Gladstone Hart  . SVT ABLATION N/Marissa 07/20/2017   Procedure: SVT ABLATION;  Surgeon: Marissa Grayer, MD;  Location: [place] CV LAB;  Service: Cardiovascular;  Laterality: N/Marissa;  . TEE WITHOUT CARDIOVERSION N/Marissa 04/29/2013   Procedure: TRANSESOPHAGEAL ECHOCARDIOGRAM (TEE);  Surgeon: Marissa Booze, MD;  Location: [place];  Service: Cardiovascular;  Laterality: N/Marissa;  . UPPER GASTROINTESTINAL ENDOSCOPY      History reviewed. No pertinent family history.  Social History   Socioeconomic History  . Marital status: Widowed    Spouse name: None  . Number of children: 1  . Years of education: None  . Highest education level: None  Social Needs  . Financial resource strain: None  . Food insecurity - worry: None  . Food insecurity - inability: None  . Transportation needs - medical: None  . Transportation needs - non-medical: None  Occupational History    Employer: RETIRED  Tobacco Use  . Smoking status: Former Smoker    Packs/day: 0.50    Years: 3.00    Pack years: 1.50    Types: Cigarettes    Last attempt to quit: 09/19/1966    Years since quitting: 51.0  . Smokeless tobacco: Never Used  Substance and Sexual Activity  . Alcohol use: Yes    Alcohol/week: 1.8 oz    Types: 3 Glasses of wine per week    Comment: wine weekly  . Drug use: No  . Sexual activity: None  Other Topics Concern  . 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  acetaminophen (TYLENOL) 500 MG tablet, Take 500 mg by mouth daily as needed for moderate pain or headache., Disp: , Rfl:  .  Cholecalciferol (VITAMIN D) 1000 UNITS capsule, Take 1,000 Units by mouth daily.  , Disp: , Rfl:  .  Cyanocobalamin (B-12) 5000 MCG SUBL, Place 5,000 mcg under the tongue daily., Disp: , Rfl:  .  docusate sodium (COLACE) 100 MG capsule, Take 100 mg by mouth every evening. , Disp: , Rfl:  .  edoxaban (SAVAYSA) 60 MG TABS tablet, Take 60 mg by mouth daily., Disp: 30 tablet, Rfl: 11 .  estradiol (VIVELLE-DOT) 0.05 MG/24HR, Place 0.5 patches onto  the skin 2 (two) times Marissa week. Wed and Sat, Disp: , Rfl:  .  Hypromellose (ARTIFICIAL TEARS OP), Apply 1 drop to eye daily as needed (dry eyes)., Disp: , Rfl:  .  levothyroxine (SYNTHROID, LEVOTHROID) 75 MCG tablet, TAKE 1 TABLET BY MOUTH 4 DAYS PER WEEK (Patient taking differently: TAKE 1 TABLET BY MOUTH 3 DAYS PER WEEK), Disp: 48 tablet, Rfl: 1 .  levothyroxine (SYNTHROID, LEVOTHROID) 88 MCG tablet, TAKE 88MCG BY MOUTH 2 DAYS PER WEEK AND TAKE 75MCG ON ALL OTHER DAYS (Patient taking differently: Take 72mg by mouth 3 days per week and take 725m on all other days), Disp: 90 tablet, Rfl: 1 .  Multiple Vitamins-Minerals (MULTIVITAMIN WITH MINERALS) tablet, Take 1 tablet by mouth daily.  , Disp: , Rfl:  .  psyllium (METAMUCIL) 58.6 % powder, Take 1 packet by mouth daily as needed (fiber). , Disp: , Rfl:  .  verapamil (CALAN-SR) 120 MG CR tablet, Take 1 tablet (120 mg total) by mouth daily., Disp: 90 tablet, Rfl: 3  EXAM:  Vitals:   10/12/17 0807  BP: 102/72  Pulse: 78  Temp: 97.7 F (36.5 C)    GENERAL: vitals reviewed and listed below, alert, oriented, appears well hydrated and in no acute distress  HEENT: head atraumatic, PERRLA, normal appearance of eyes, ears, nose and mouth. moist mucus membranes.  NECK: supple, no masses or lymphadenopathy  LUNGS: clear to auscultation  bilaterally, no rales, rhonchi or wheeze  CV: HRRR, no peripheral edema or cyanosis, normal pedal pulses  ABDOMEN: bowel sounds normal, soft, non tender to palpation, no masses, no rebound or guarding  GU/BREAST: declined, sees gyn  SKIN: no rash or abnormal lesions  MS: normal gait, moves all extremities normally  NEURO: normal gait, speech and thought processing grossly intact, muscle tone grossly intact throughout  PSYCH: normal affect, pleasant and cooperative  ASSESSMENT AND PLAN:  Discussed the following assessment and plan:  PREVENTIVE EXAM/CPE: -Discussed and advised all [place] health task force level Marissa and B recommendations for age, sex and risks. -Advised at least 150 minutes of exercise per week and Marissa healthy diet with avoidance of (less then 1 serving per week) processed foods, white starches, red meat, fast foods and sweets and consisting of: * 5-9 servings of fresh fruits and vegetables (not corn or potatoes) *nuts and seeds, beans *olives and olive oil *lean meats such as fish and white chicken  *whole grains -labs, studies and vaccines per orders this encounter   2. Hypothyroidism, unspecified type -cont current treatment, adjust if needed pending labs - TSH  3. [facility]-se[facility] for management - Basic metabolic panel - CBC  4. Dyslipidemia - Lipid panel  5. Hot flashes - on low dose HRT with gynecologist, Marissa. NeNori RiisPatient advised to return to clinic immediately if symptoms worsen or persist or new concerns.  Patient Instructions  BEFORE YOU LEAVE: -labs -follow up: 6 months   We recommend the following healthy lifestyle for LIFE: 1) Small portions. But, make sure to get regular (at least 3 per day), healthy meals and small healthy snacks if needed.  2) Eat Marissa healthy clean diet.   TRY TO EAT: -at least 5-7 servings of low sugar, colorful, and nutrient rich vegetables per day (not corn,  potatoes or bananas.) -berries are the best choice if you wish to eat fruit (only eat small amounts if trying to reduce weight)  -lean meets (fish, white meat of chicken or  [place]) -vegan proteins for some meals - beans or tofu, whole grains, nuts and seeds -Replace bad fats with good fats - good fats include: fish, nuts and seeds, canola oil, olive oil -small amounts of low fat or non fat dairy -small amounts of100 % whole grains - check the lables -drink plenty of water  AVOID: -SUGAR, sweets, anything with added sugar, corn syrup or sweeteners - must read labels as even foods advertised as "healthy" often are loaded with sugar -if you must have Marissa sweetener, small amounts of stevia may be best -sweetened beverages and artificially sweetened beverages -simple starches (rice, bread, potatoes, pasta, chips, etc - small amounts of 100% whole grains are ok) -red meat, pork, butter -fried foods, fast food, processed food, excessive dairy, eggs and coconut.  3)Get at least 150 minutes of sweaty aerobic exercise per week.  4)Reduce stress - consider counseling, meditation and relaxation to balance other aspects of your life.   Preventive Care 20 Years and Older, Female Preventive care refers to lifestyle choices and visits with your health care provider that can promote health and wellness. What does preventive care include?  Marissa yearly physical exam. This is also called an annual well check.  Dental exams once or twice Marissa year.  Routine eye exams. Ask your health care provider how often you should have your eyes checked.  Personal lifestyle choices, including: ? Daily care of your teeth and gums. ? Regular physical activity. ? Eating Marissa healthy diet. ? Avoiding tobacco and drug use. ? Limiting alcohol use. ? Practicing safe sex. ? Taking low-dose aspirin every day. ? Taking vitamin and mineral supplements as recommended by your health care provider. What happens during an annual well  check? The services and screenings done by your health care provider during your annual well check will depend on your age, overall health, lifestyle risk factors, and family history of disease. Counseling Your health care provider may ask you questions about your:  Alcohol use.  Tobacco use.  Drug use.  Emotional well-being.  Home and relationship well-being.  Sexual activity.  Eating habits.  History of falls.  Memory and ability to understand (cognition).  Work and work Statistician.  Reproductive health.  Screening You may have the following tests or measurements:  Height, weight, and BMI.  Blood pressure.  Lipid and cholesterol levels. These may be checked every 5 years, or more frequently if you are over 51 years old.  Skin check.  Lung cancer screening. You may have this screening every year starting at age 63 if you have Marissa 30-pack-year history of smoking and currently smoke or have quit within the past 15 years.  Fecal occult blood test (FOBT) of the stool. You may have this test every year starting at age 79.  Flexible sigmoidoscopy or colonoscopy. You may have Marissa sigmoidoscopy every 5 years or Marissa colonoscopy every 10 years starting at age 18.  Hepatitis C blood test.  Hepatitis B blood test.  Sexually transmitted disease (STD) testing.  Diabetes screening. This is done by checking your blood sugar (glucose) after you have not eaten for Marissa while (fasting). You may have this done every 1-3 years.  Bone density scan. This is done to screen for osteoporosis. You may have this done starting at age 20.  Mammogram. This may be done every 1-2 years. Talk to your health care provider about how often you should have regular mammograms.  Talk with your health care provider about your test results, treatment options,  and if necessary, the need for more tests. Vaccines Your health care provider may recommend certain vaccines, such as:  Influenza vaccine. This is  recommended every year.  Tetanus, diphtheria, and acellular pertussis (Tdap, Td) vaccine. You may need Marissa Td booster every 10 years.  Varicella vaccine. You may need this if you have not been vaccinated.  Zoster vaccine. You may need this after age 80.  Measles, mumps, and rubella (MMR) vaccine. You may need at least one dose of MMR if you were born in 1957 or later. You may also need Marissa second dose.  Pneumococcal 13-valent conjugate (PCV13) vaccine. One dose is recommended after age 63.  Pneumococcal polysaccharide (PPSV23) vaccine. One dose is recommended after age 57.  Meningococcal vaccine. You may need this if you have certain conditions.  Hepatitis Marissa vaccine. You may need this if you have certain conditions or if you travel or work in places where you may be exposed to hepatitis Marissa.  Hepatitis B vaccine. You may need this if you have certain conditions or if you travel or work in places where you may be exposed to hepatitis B.  Haemophilus influenzae type b (Hib) vaccine. You may need this if you have certain conditions.  Talk to your health care provider about which screenings and vaccines you need and how often you need them. This information is not intended to replace advice given to you by your health care provider. Make sure you discuss any questions you have with your health care provider. Document Released: 10/02/2015 Document Revised: 05/25/2016 Document Reviewed: 07/07/2015 Elsevier Interactive Patient Education  2018 [employer].     No Follow-up on file.  Lucretia Kern, DO

## 2017-10-12 ENCOUNTER — Encounter: Payer: Self-pay | Admitting: Family Medicine

## 2017-10-12 ENCOUNTER — Ambulatory Visit (INDEPENDENT_AMBULATORY_CARE_PROVIDER_SITE_OTHER): Payer: Medicare Other | Admitting: Family Medicine

## 2017-10-12 VITALS — BP 102/72 | HR 78 | Temp 97.7°F | Ht 62.25 in | Wt 144.2 lb

## 2017-10-12 DIAGNOSIS — Z0000 Encounter for general adult medical examination without abnormal findings: Secondary | ICD-10-CM | POA: Diagnosis not present

## 2017-10-12 DIAGNOSIS — E785 Hyperlipidemia, unspecified: Secondary | ICD-10-CM

## 2017-10-12 DIAGNOSIS — E039 Hypothyroidism, unspecified: Secondary | ICD-10-CM

## 2017-10-12 DIAGNOSIS — R232 Flushing: Secondary | ICD-10-CM

## 2017-10-12 DIAGNOSIS — I480 Paroxysmal atrial fibrillation: Secondary | ICD-10-CM

## 2017-10-12 LAB — BASIC METABOLIC PANEL
BUN: 14 mg/dL (ref 6–23)
CALCIUM: 9.2 mg/dL (ref 8.4–10.5)
CO2: 31 mEq/L (ref 19–32)
CREATININE: 0.73 mg/dL (ref 0.40–1.20)
Chloride: 101 mEq/L (ref 96–112)
GFR: 81.36 mL/min (ref >60.00)
Glucose, Bld: 93 mg/dL (ref 70–99)
Potassium: 4.4 mEq/L (ref 3.5–5.1)
Sodium: 139 mEq/L (ref 135–145)

## 2017-10-12 LAB — LIPID PANEL
Cholesterol: 254 mg/dL — ABNORMAL HIGH (ref 0–200)
HDL: 39.1 mg/dL (ref >39.00)
LDL Cholesterol: 178 mg/dL — ABNORMAL HIGH (ref 0–99)
NONHDL: 214.73
Total CHOL/HDL Ratio: 6
Triglycerides: 182 mg/dL — ABNORMAL HIGH (ref 0.0–149.0)
VLDL: 36.4 mg/dL (ref 0.0–40.0)

## 2017-10-12 LAB — CBC
HCT: 40.9 % (ref 36.0–46.0)
Hemoglobin: 13.9 g/dL (ref 12.0–15.0)
MCHC: 33.9 g/dL (ref 30.0–36.0)
MCV: 96.1 fl (ref 78.0–100.0)
Platelets: 263 10*3/uL (ref 150.0–400.0)
RBC: 4.25 Mil/uL (ref 3.87–5.11)
RDW: 13.4 % (ref 11.5–15.5)
WBC: 7.3 10*3/uL (ref 4.0–10.5)

## 2017-10-12 LAB — TSH: TSH: 5.27 u[IU]/mL — AB (ref 0.35–4.50)

## 2017-10-12 NOTE — Patient Instructions (Addendum)
BEFORE YOU LEAVE: -labs -follow up: 6 months   We recommend the following healthy lifestyle for LIFE: 1) Small portions. But, make sure to get regular (at least 3 per day), healthy meals and small healthy snacks if needed.  2) Eat a healthy clean diet.   TRY TO EAT: -at least 5-7 servings of low sugar, colorful, and nutrient rich vegetables per day (not corn, potatoes or bananas.) -berries are the best choice if you wish to eat fruit (only eat small amounts if trying to reduce weight)  -lean meets (fish, white meat of chicken or [place]) -vegan proteins for some meals - beans or tofu, whole grains, nuts and seeds -Replace bad fats with good fats - good fats include: fish, nuts and seeds, canola oil, olive oil -small amounts of low fat or non fat dairy -small amounts of100 % whole grains - check the lables -drink plenty of water  AVOID: -SUGAR, sweets, anything with added sugar, corn syrup or sweeteners - must read labels as even foods advertised as "healthy" often are loaded with sugar -if you must have a sweetener, small amounts of stevia may be best -sweetened beverages and artificially sweetened beverages -simple starches (rice, bread, potatoes, pasta, chips, etc - small amounts of 100% whole grains are ok) -red meat, pork, butter -fried foods, fast food, processed food, excessive dairy, eggs and coconut.  3)Get at least 150 minutes of sweaty aerobic exercise per week.  4)Reduce stress - consider counseling, meditation and relaxation to balance other aspects of your life.   Preventive Care 20 Years and Older, Female Preventive care refers to lifestyle choices and visits with your health care provider that can promote health and wellness. What does preventive care include?  A yearly physical exam. This is also called an annual well check.  Dental exams once or twice a year.  Routine eye exams. Ask your health care provider how often you should have your eyes  checked.  Personal lifestyle choices, including: ? Daily care of your teeth and gums. ? Regular physical activity. ? Eating a healthy diet. ? Avoiding tobacco and drug use. ? Limiting alcohol use. ? Practicing safe sex. ? Taking low-dose aspirin every day. ? Taking vitamin and mineral supplements as recommended by your health care provider. What happens during an annual well check? The services and screenings done by your health care provider during your annual well check will depend on your age, overall health, lifestyle risk factors, and family history of disease. Counseling Your health care provider may ask you questions about your:  Alcohol use.  Tobacco use.  Drug use.  Emotional well-being.  Home and relationship well-being.  Sexual activity.  Eating habits.  History of falls.  Memory and ability to understand (cognition).  Work and work Statistician.  Reproductive health.  Screening You may have the following tests or measurements:  Height, weight, and BMI.  Blood pressure.  Lipid and cholesterol levels. These may be checked every 5 years, or more frequently if you are over 34 years old.  Skin check.  Lung cancer screening. You may have this screening every year starting at age 36 if you have a 30-pack-year history of smoking and currently smoke or have quit within the past 15 years.  Fecal occult blood test (FOBT) of the stool. You may have this test every year starting at age 67.  Flexible sigmoidoscopy or colonoscopy. You may have a sigmoidoscopy every 5 years or a colonoscopy every 10 years starting at age 35.  Hepatitis C blood test.  Hepatitis B blood test.  Sexually transmitted disease (STD) testing.  Diabetes screening. This is done by checking your blood sugar (glucose) after you have not eaten for a while (fasting). You may have this done every 1-3 years.  Bone density scan. This is done to screen for osteoporosis. You may have this done  starting at age 13.  Mammogram. This may be done every 1-2 years. Talk to your health care provider about how often you should have regular mammograms.  Talk with your health care provider about your test results, treatment options, and if necessary, the need for more tests. Vaccines Your health care provider may recommend certain vaccines, such as:  Influenza vaccine. This is recommended every year.  Tetanus, diphtheria, and acellular pertussis (Tdap, Td) vaccine. You may need a Td booster every 10 years.  Varicella vaccine. You may need this if you have not been vaccinated.  Zoster vaccine. You may need this after age 4.  Measles, mumps, and rubella (MMR) vaccine. You may need at least one dose of MMR if you were born in 1957 or later. You may also need a second dose.  Pneumococcal 13-valent conjugate (PCV13) vaccine. One dose is recommended after age 57.  Pneumococcal polysaccharide (PPSV23) vaccine. One dose is recommended after age 14.  Meningococcal vaccine. You may need this if you have certain conditions.  Hepatitis A vaccine. You may need this if you have certain conditions or if you travel or work in places where you may be exposed to hepatitis A.  Hepatitis B vaccine. You may need this if you have certain conditions or if you travel or work in places where you may be exposed to hepatitis B.  Haemophilus influenzae type b (Hib) vaccine. You may need this if you have certain conditions.  Talk to your health care provider about which screenings and vaccines you need and how often you need them. This information is not intended to replace advice given to you by your health care provider. Make sure you discuss any questions you have with your health care provider. Document Released: 10/02/2015 Document Revised: 05/25/2016 Document Reviewed: 07/07/2015 Elsevier Interactive Patient Education  [employer].

## 2017-10-17 NOTE — Addendum Note (Signed)
Addended by: Agnes Lawrence on: 10/17/2017 04:09 PM   Modules accepted: Orders

## 2017-10-23 ENCOUNTER — Ambulatory Visit (INDEPENDENT_AMBULATORY_CARE_PROVIDER_SITE_OTHER): Payer: Medicare Other | Admitting: *Deleted

## 2017-10-23 DIAGNOSIS — I480 Paroxysmal atrial fibrillation: Secondary | ICD-10-CM | POA: Diagnosis not present

## 2017-10-23 NOTE — Progress Notes (Signed)
Carelink Summary Reprot / Loop Recorder 

## 2017-10-27 ENCOUNTER — Encounter: Payer: Self-pay | Admitting: Gastroenterology

## 2017-11-14 LAB — CUP PACEART REMOTE DEVICE CHECK
MDC IDC PG IMPLANT DT: 20180905
MDC IDC SESS DTM: 20190202231203

## 2017-11-20 ENCOUNTER — Telehealth: Payer: Self-pay | Admitting: *Deleted

## 2017-11-20 NOTE — Telephone Encounter (Signed)
Spoke with patient regarding tachy episodes noted on LINQ.  ECGs appear AT, consistent with previous episodes.  Patient reports that she feels the episodes are infrequent and short duration.  She occasionally notes transient dizziness and fluttering associated with tachycardia, but feels she is doing much better overall and feels that her current medication regimen is working well.  Patient agrees to call our office with new, increased, or worsening symptoms.  She is appreciative of call and denies additional questions or concerns at this time.

## 2017-11-23 ENCOUNTER — Ambulatory Visit (INDEPENDENT_AMBULATORY_CARE_PROVIDER_SITE_OTHER): Payer: Medicare Other | Admitting: *Deleted

## 2017-11-23 DIAGNOSIS — I480 Paroxysmal atrial fibrillation: Secondary | ICD-10-CM | POA: Diagnosis not present

## 2017-11-24 NOTE — Progress Notes (Signed)
Carelink Summary Report / Loop Recorder 

## 2017-11-27 ENCOUNTER — Encounter: Payer: Self-pay | Admitting: Family Medicine

## 2017-11-29 ENCOUNTER — Other Ambulatory Visit (INDEPENDENT_AMBULATORY_CARE_PROVIDER_SITE_OTHER): Payer: Medicare Other

## 2017-11-29 DIAGNOSIS — E039 Hypothyroidism, unspecified: Secondary | ICD-10-CM

## 2017-11-29 LAB — TSH: TSH: 0.82 u[IU]/mL (ref 0.35–4.50)

## 2017-12-11 ENCOUNTER — Other Ambulatory Visit: Payer: Self-pay | Admitting: Internal Medicine

## 2017-12-14 DIAGNOSIS — N958 Other specified menopausal and perimenopausal disorders: Secondary | ICD-10-CM | POA: Diagnosis not present

## 2017-12-14 DIAGNOSIS — Z01419 Encounter for gynecological examination (general) (routine) without abnormal findings: Secondary | ICD-10-CM | POA: Diagnosis not present

## 2017-12-14 DIAGNOSIS — Z13820 Encounter for screening for osteoporosis: Secondary | ICD-10-CM | POA: Diagnosis not present

## 2017-12-14 DIAGNOSIS — Z1272 Encounter for screening for malignant neoplasm of vagina: Secondary | ICD-10-CM | POA: Diagnosis not present

## 2017-12-14 LAB — HM DEXA SCAN

## 2017-12-15 ENCOUNTER — Encounter: Payer: Self-pay | Admitting: Gastroenterology

## 2017-12-16 ENCOUNTER — Encounter: Payer: Self-pay | Admitting: Gastroenterology

## 2017-12-26 ENCOUNTER — Ambulatory Visit (INDEPENDENT_AMBULATORY_CARE_PROVIDER_SITE_OTHER): Payer: Medicare Other | Admitting: *Deleted

## 2017-12-26 DIAGNOSIS — I480 Paroxysmal atrial fibrillation: Secondary | ICD-10-CM

## 2017-12-27 NOTE — Progress Notes (Signed)
Carelink Summary Report / Loop Recorder 

## 2017-12-28 DIAGNOSIS — Z1231 Encounter for screening mammogram for malignant neoplasm of breast: Secondary | ICD-10-CM | POA: Diagnosis not present

## 2017-12-28 LAB — HM MAMMOGRAPHY

## 2018-01-04 LAB — CUP PACEART REMOTE DEVICE CHECK
Date Time Interrogation Session: 20190307234013
Implantable Pulse Generator Implant Date: 20180905

## 2018-01-12 ENCOUNTER — Ambulatory Visit: Payer: Medicare Other | Admitting: Gastroenterology

## 2018-01-29 ENCOUNTER — Ambulatory Visit (INDEPENDENT_AMBULATORY_CARE_PROVIDER_SITE_OTHER): Payer: Medicare Other | Admitting: *Deleted

## 2018-01-29 DIAGNOSIS — I480 Paroxysmal atrial fibrillation: Secondary | ICD-10-CM | POA: Diagnosis not present

## 2018-01-29 LAB — CUP PACEART REMOTE DEVICE CHECK
Date Time Interrogation Session: 20190410000617
Implantable Pulse Generator Implant Date: 20180905

## 2018-01-30 NOTE — Progress Notes (Signed)
Carelink Summary Report / Loop Recorder 

## 2018-02-14 ENCOUNTER — Ambulatory Visit: Payer: Medicare Other | Admitting: Internal Medicine

## 2018-02-14 ENCOUNTER — Encounter: Payer: Self-pay | Admitting: Internal Medicine

## 2018-02-14 VITALS — BP 126/80 | HR 76 | Ht 62.25 in | Wt 144.0 lb

## 2018-02-14 DIAGNOSIS — I471 Supraventricular tachycardia: Secondary | ICD-10-CM

## 2018-02-14 DIAGNOSIS — I480 Paroxysmal atrial fibrillation: Secondary | ICD-10-CM

## 2018-02-14 DIAGNOSIS — R000 Tachycardia, unspecified: Secondary | ICD-10-CM | POA: Insufficient documentation

## 2018-02-14 LAB — CUP PACEART INCLINIC DEVICE CHECK
Implantable Pulse Generator Implant Date: 20180905
MDC IDC SESS DTM: 20190529133113

## 2018-02-14 NOTE — Patient Instructions (Signed)
Medication Instructions:  Your physician recommends that you continue on your current medications as directed. Please refer to the Current Medication list given to you today.   Labwork: None ordered   Testing/Procedures: None ordered   Follow-Up: Your physician wants you to follow-up in: 6 months with Amber Seiler, NP You will receive a reminder letter in the mail two months in advance. If you don't receive a letter, please call our office to schedule the follow-up appointment.   Any Other Special Instructions Will Be Listed Below (If Applicable).     If you need a refill on your cardiac medications before your next appointment, please call your pharmacy.   

## 2018-02-14 NOTE — Progress Notes (Signed)
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 female who presents today for routine electrophysiology followup.  Since last being seen in our clinic, the patient reports doing very well. She has occasional palpitations but does not feel that this is adversely impacting her quality of life.  She does not wish to make medicine changes.  She is active.  She stretches and rides a stationary bike without difficulty.  Today, she denies symptoms of chest pain, shortness of breath,  lower extremity edema, dizziness, presyncope, or syncope.  The patient is otherwise without complaint today.   Past Medical History:  Diagnosis Date  . Acquired renal cyst of right kidney   . Anxiety   . Atrial fibrillation and flutter ([place])    s/p PVI and CTI by Dr Rayann Heman 04/2013  . Blood transfusion   . Cataract   . CHEST WALL PAIN, HX OF 07/28/2007   Qualifier: Diagnosis of  By: Julien Girt CMA, [name]    . Diverticulosis of colon   . DJD (degenerative joint disease) 10/18/2011  . Erysipelas   . Fibromyalgia   . GERD (gastroesophageal reflux disease)   . Hiatal hernia   . Hot flashes - on low dose HRT with gynecologist, Dr. Nori Riis 03/31/2014  . Hx of colonic polyps   . Hyperlipidemia   . MITRAL VALVE PROLAPSE 07/28/2007   Qualifier: Diagnosis of  By: Julien Girt CMA, [name]    . Supraventricular tachycardia ([place])    AV nodal reentry  s/p RFCA 2001  . Thyroid disease    Past Surgical History:  Procedure Laterality Date  . ABDOMINAL HYSTERECTOMY    . ATRIAL FIBRILLATION ABLATION N/A 04/30/2013   Procedure: ATRIAL FIBRILLATION ABLATION;  Surgeon: [name], MD;  Location: [facility] CATH LAB;  Service: Cardiovascular;  Laterality: N/A;PVI and CTI by Dr Rayann Heman   . BREAST BIOPSY Left    benign  . BREAST BIOPSY  2011   benign  . CATARACT EXTRACTION BILATERAL W/ ANTERIOR VITRECTOMY    . COLONOSCOPY    . COLONOSCOPY    . ESOPHAGOGASTRODUODENOSCOPY    . KNEE ARTHROSCOPY  2012  . LOOP RECORDER  INSERTION N/A 05/24/2017   Procedure: LOOP RECORDER INSERTION;  Surgeon: [name], MD;  Location: [place] CV LAB;  Service: Cardiovascular;  Laterality: N/A;  . OOPHORECTOMY    . ROTATOR CUFF REPAIR  08/2001   Dr. Gladstone Lighter  . SVT ABLATION N/A 07/20/2017   Procedure: SVT ABLATION;  Surgeon: [name], MD;  Location: [place] CV LAB;  Service: Cardiovascular;  Laterality: N/A;  . TEE WITHOUT CARDIOVERSION N/A 04/29/2013   Procedure: TRANSESOPHAGEAL ECHOCARDIOGRAM (TEE);  Surgeon: Jettie Booze, MD;  Location: [place];  Service: Cardiovascular;  Laterality: N/A;  . UPPER GASTROINTESTINAL ENDOSCOPY      ROS- all systems are reviewed and negatives except as per HPI above  Current Outpatient Medications  Medication Sig Dispense Refill  . acetaminophen (TYLENOL) 500 MG tablet Take 500 mg by mouth daily as needed for moderate pain or headache.    . Cyanocobalamin (B-12) 5000 MCG SUBL Place 5,000 mcg under the tongue daily.    [name] Kitchen docusate sodium (COLACE) 100 MG capsule Take 100 mg by mouth every evening.     . edoxaban (SAVAYSA) 60 MG TABS tablet Take 60 mg by mouth daily. 30 tablet 11  . estradiol (VIVELLE-DOT) 0.05 MG/24HR Place 0.5 patches onto the skin 2 (two) times a week. Wed and Sat    .  Hypromellose (ARTIFICIAL TEARS OP) Apply 1 drop to eye daily as needed (dry eyes).    [name] Kitchen levothyroxine (SYNTHROID, LEVOTHROID) 75 MCG tablet TAKE 1 TABLET BY MOUTH 4 DAYS PER WEEK (Patient taking differently: TAKE 1 TABLET BY MOUTH 3 DAYS PER WEEK) 48 tablet 1  . levothyroxine (SYNTHROID, LEVOTHROID) 88 MCG tablet TAKE 88MCG BY MOUTH 2 DAYS PER WEEK AND TAKE 75MCG ON ALL OTHER DAYS (Patient taking differently: Take 29mcg by mouth 3 days per week and take 32mcg on all other days) 90 tablet 1  . psyllium (METAMUCIL) 58.6 % powder Take 1 packet by mouth daily as needed (fiber).     . verapamil (CALAN-SR) 120 MG CR tablet Take 1 tablet (120 mg total) by mouth daily. 90 tablet 3  . VITAMIN D,  CHOLECALCIFEROL, PO Take 4,000 Units by mouth daily.     No current facility-administered medications for this visit.     Physical Exam: Vitals:   02/14/18 1209  BP: 126/80  Pulse: 76  Weight: 144 lb (65.3 kg)  Height: 5' 2.25" (1.581 m)    GEN- The patient is well appearing, alert and oriented x 3 today.   Head- normocephalic, atraumatic Eyes-  Sclera clear, conjunctiva pink Ears- hearing intact Oropharynx- clear Lungs- Clear to ausculation bilaterally, normal work of breathing Heart- Regular rate and rhythm, no murmurs, rubs or gallops, PMI not laterally displaced GI- soft, NT, ND, + BS Extremities- no clubbing, cyanosis, or edema  Wt Readings from Last 3 Encounters:  02/14/18 144 lb (65.3 kg)  10/12/17 144 lb 3.2 oz (65.4 kg)  09/29/17 146 lb (66.2 kg)    EKG tracing ordered today is personally reviewed and shows sinus rhythm 76 bpm, PR 178 msec, QRS 70 msec, QTc 450 msec  Assessment and Plan:  1. SVT Multiple atrial tachycardias not suitable for mapping on prior EPS Medical therapy long term She has had more episodes over the past few months but is not interested in medicine changes Could consider multaq if her arrhythmia progresses.  2. afib Well controlled chads2vasc score is 3.  She is doing well with savaysa  [facility] Return to see EP NP in 6 months  [name] MD, [facility] 02/14/2018 1:01 PM

## 2018-02-21 LAB — CUP PACEART REMOTE DEVICE CHECK
Implantable Pulse Generator Implant Date: 20180905
MDC IDC SESS DTM: 20190513001049

## 2018-02-22 ENCOUNTER — Other Ambulatory Visit: Payer: Self-pay | Admitting: Internal Medicine

## 2018-02-22 ENCOUNTER — Other Ambulatory Visit: Payer: Self-pay | Admitting: Family Medicine

## 2018-02-23 NOTE — Telephone Encounter (Addendum)
Savaysa 60mg  refill request received; pt is 83 yrs old, wt-65.3kg, Crea-0.73 on 10/12/17, last seen by Dr. Rayann Heman on 02/14/18, CrCl- 62.70ml/min, will send in refill to requested pharmacy.

## 2018-03-01 ENCOUNTER — Ambulatory Visit (INDEPENDENT_AMBULATORY_CARE_PROVIDER_SITE_OTHER): Payer: Medicare Other | Admitting: *Deleted

## 2018-03-01 DIAGNOSIS — I480 Paroxysmal atrial fibrillation: Secondary | ICD-10-CM

## 2018-03-01 NOTE — Progress Notes (Signed)
Carelink Summary Report / Loop Recorder 

## 2018-03-05 ENCOUNTER — Ambulatory Visit: Payer: Medicare Other | Admitting: *Deleted

## 2018-03-05 DIAGNOSIS — I480 Paroxysmal atrial fibrillation: Secondary | ICD-10-CM | POA: Diagnosis not present

## 2018-03-05 NOTE — Progress Notes (Signed)
Carelink Summary Report / Loop Recorder 

## 2018-04-03 DIAGNOSIS — M25512 Pain in left shoulder: Secondary | ICD-10-CM | POA: Diagnosis not present

## 2018-04-03 DIAGNOSIS — M25561 Pain in right knee: Secondary | ICD-10-CM | POA: Diagnosis not present

## 2018-04-04 ENCOUNTER — Ambulatory Visit (INDEPENDENT_AMBULATORY_CARE_PROVIDER_SITE_OTHER): Payer: Medicare Other | Admitting: *Deleted

## 2018-04-04 DIAGNOSIS — I480 Paroxysmal atrial fibrillation: Secondary | ICD-10-CM | POA: Diagnosis not present

## 2018-04-05 NOTE — Progress Notes (Signed)
Carelink Summary Report / Loop Recorder 

## 2018-04-06 LAB — CUP PACEART REMOTE DEVICE CHECK
Implantable Pulse Generator Implant Date: 20180905
MDC IDC SESS DTM: 20190615000750

## 2018-04-12 ENCOUNTER — Ambulatory Visit: Payer: Medicare Other | Admitting: Family Medicine

## 2018-04-16 NOTE — Progress Notes (Signed)
HPI:  Using dictation device. Unfortunately this device frequently misinterprets words/phrases.  Marissa Hart is a pleasant 83 y.o. here for follow up. Chronic medical problems summarized below were reviewed for changes and stability and were updated as needed below. These issues and their treatment remain stable for the most part.  Reports she is doing well.  No complaints today.  The dosing on her thyroid medication said to take the lower dose 4 days/week and the higher dose 2 days/week.  She has been taking the lower dose, 75 mcg 5 days/week and the 88 mcg 2 days/week. Denies CP, palpitations SOB, DOE, treatment intolerance or new symptoms. Due for labs  AWV/CPE 10/12/17  Hypothyroidism: -on levothyroxine -doing well  Hot Flashes: -on vivelle dot -sees gynecologist for this, on very low dose of transdermal estrogen, s/p hysterectomy  Dyslipidemia: -low hdl -she does not want to check further - reports would never take medications for this -doing healthy lifestyle  PAF, atrial flutter, PACs: -followed by cardiology/electrophysiology  -s/p ablation in 2014, attempted in 2018, implanted monitor in 2018 -on edoxaban (savaysa) and now on verapamil daily and doing better  Bereavment: -husband passed - grieving but doing well -anniversaries and holidays are tough -she is in a widows group at church and this has been very helpful, feels is coping well   ROS: See pertinent positives and negatives per HPI.  Past Medical History:  Diagnosis Date  . Acquired renal cyst of right kidney   . Anxiety   . Atrial fibrillation and flutter ([place])    s/p PVI and CTI by Dr Rayann Heman 04/2013  . Blood transfusion   . Cataract   . CHEST WALL PAIN, HX OF 07/28/2007   Qualifier: Diagnosis of  By: Julien Girt CMA, [name]    . Diverticulosis of colon   . DJD (degenerative joint disease) 10/18/2011  . Erysipelas   . Fibromyalgia   . GERD (gastroesophageal reflux disease)   . Hiatal hernia   .  Hot flashes - on low dose HRT with gynecologist, Dr. Nori Riis 03/31/2014  . Hx of colonic polyps   . Hyperlipidemia   . MITRAL VALVE PROLAPSE 07/28/2007   Qualifier: Diagnosis of  By: Julien Girt CMA, [name]    . Supraventricular tachycardia ([place])    AV nodal reentry  s/p RFCA 2001  . Thyroid disease     Past Surgical History:  Procedure Laterality Date  . ABDOMINAL HYSTERECTOMY    . ATRIAL FIBRILLATION ABLATION N/A 04/30/2013   Procedure: ATRIAL FIBRILLATION ABLATION;  Surgeon: [name], MD;  Location: [facility] CATH LAB;  Service: Cardiovascular;  Laterality: N/A;PVI and CTI by Dr Rayann Heman   . BREAST BIOPSY Left    benign  . BREAST BIOPSY  2011   benign  . CATARACT EXTRACTION BILATERAL W/ ANTERIOR VITRECTOMY    . COLONOSCOPY    . COLONOSCOPY    . ESOPHAGOGASTRODUODENOSCOPY    . KNEE ARTHROSCOPY  2012  . LOOP RECORDER INSERTION N/A 05/24/2017   Procedure: LOOP RECORDER INSERTION;  Surgeon: [name], MD;  Location: [place] CV LAB;  Service: Cardiovascular;  Laterality: N/A;  . OOPHORECTOMY    . ROTATOR CUFF REPAIR  08/2001   Dr. Gladstone Lighter  . SVT ABLATION N/A 07/20/2017   Procedure: SVT ABLATION;  Surgeon: [name], MD;  Location: [place] CV LAB;  Service: Cardiovascular;  Laterality: N/A;  . TEE WITHOUT CARDIOVERSION N/A 04/29/2013   Procedure: TRANSESOPHAGEAL ECHOCARDIOGRAM (TEE);  Surgeon: Jettie Booze, MD;  Location: [place];  Service:  Cardiovascular;  Laterality: N/A;  . UPPER GASTROINTESTINAL ENDOSCOPY      History reviewed. No pertinent family history.  SOCIAL HX: See HPI   Current Outpatient Medications:  .  acetaminophen (TYLENOL) 500 MG tablet, Take 500 mg by mouth daily as needed for moderate pain or headache., Disp: , Rfl:  .  Cyanocobalamin (B-12) 5000 MCG SUBL, Place 5,000 mcg under the tongue daily., Disp: , Rfl:  .  docusate sodium (COLACE) 100 MG capsule, Take 100 mg by mouth every evening. , Disp: , Rfl:  .  estradiol (VIVELLE-DOT) 0.05 MG/24HR,  Place 0.5 patches onto the skin 2 (two) times a week. Wed and Sat, Disp: , Rfl:  .  Hypromellose (ARTIFICIAL TEARS OP), Apply 1 drop to eye daily as needed (dry eyes)., Disp: , Rfl:  .  levothyroxine (SYNTHROID, LEVOTHROID) 75 MCG tablet, TAKE 1 TABLET BY MOUTH 4 DAYS PER WEEK, Disp: 48 tablet, Rfl: 1 .  levothyroxine (SYNTHROID, LEVOTHROID) 88 MCG tablet, TAKE 88MCG BY MOUTH 2 DAYS PER WEEK AND TAKE 75MCG ON ALL OTHER DAYS (Patient taking differently: Take 15mcg by mouth 3 days per week and take 66mcg on all other days), Disp: 90 tablet, Rfl: 1 .  psyllium (METAMUCIL) 58.6 % powder, Take 1 packet by mouth daily as needed (fiber). , Disp: , Rfl:  .  SAVAYSA 60 MG TABS tablet, TAKE ONE TABLET BY MOUTH ONCE DAILY, Disp: 30 tablet, Rfl: 11 .  verapamil (CALAN-SR) 120 MG CR tablet, Take 1 tablet (120 mg total) by mouth daily., Disp: 90 tablet, Rfl: 3 .  VITAMIN D, CHOLECALCIFEROL, PO, Take 4,000 Units by mouth daily., Disp: , Rfl:   EXAM:  Vitals:   04/17/18 1009  BP: 124/70  Pulse: 77  Temp: 97.9 F (36.6 C)    Body mass index is 26.13 kg/m.  GENERAL: vitals reviewed and listed above, alert, oriented, appears well hydrated and in no acute distress  HEENT: atraumatic, conjunttiva clear, no obvious abnormalities on inspection of external nose and ears  NECK: no obvious masses on inspection  LUNGS: clear to auscultation bilaterally, no wheezes, rales or rhonchi, good air movement  CV: HRRR, no peripheral edema  MS: moves all extremities without noticeable abnormality  PSYCH: pleasant and cooperative, no obvious depression or anxiety  ASSESSMENT AND PLAN:  Discussed the following assessment and plan:  Paroxysmal atrial fibrillation (HCC) - Plan: Basic metabolic panel, CBC  Hypothyroidism, unspecified type - Plan: TSH  Hyperlipidemia, unspecified hyperlipidemia type  -Labs today -Lifestyle recommendations -Adjust thyroid medication as needed pending lab results -Follow-up 4  months  Patient Instructions  BEFORE YOU LEAVE: -labs -follow up: 3-4 months  We have ordered labs or studies at this visit. It can take up to 1-2 weeks for results and processing. IF results require follow up or explanation, we will call you with instructions. Clinically stable results will be released to your [facility]. If you have not heard from [place] or cannot find your results in [facility] in 2 weeks please contact our office at [phone].  If you are not yet signed up for [facility], please consider signing up.           Lucretia Kern, DO

## 2018-04-17 ENCOUNTER — Encounter: Payer: Self-pay | Admitting: Family Medicine

## 2018-04-17 ENCOUNTER — Ambulatory Visit: Payer: Medicare Other | Admitting: Family Medicine

## 2018-04-17 VITALS — BP 124/70 | HR 77 | Temp 97.9°F | Ht 62.25 in | Wt 144.0 lb

## 2018-04-17 DIAGNOSIS — D72829 Elevated white blood cell count, unspecified: Secondary | ICD-10-CM | POA: Diagnosis not present

## 2018-04-17 DIAGNOSIS — E785 Hyperlipidemia, unspecified: Secondary | ICD-10-CM | POA: Diagnosis not present

## 2018-04-17 DIAGNOSIS — I480 Paroxysmal atrial fibrillation: Secondary | ICD-10-CM | POA: Diagnosis not present

## 2018-04-17 DIAGNOSIS — E039 Hypothyroidism, unspecified: Secondary | ICD-10-CM | POA: Diagnosis not present

## 2018-04-17 LAB — BASIC METABOLIC PANEL
BUN: 11 mg/dL (ref 6–23)
CALCIUM: 9.2 mg/dL (ref 8.4–10.5)
CO2: 33 meq/L — AB (ref 19–32)
CREATININE: 0.83 mg/dL (ref 0.40–1.20)
Chloride: 98 mEq/L (ref 96–112)
GFR: 70.06 mL/min (ref >60.00)
Glucose, Bld: 77 mg/dL (ref 70–99)
Potassium: 4.7 mEq/L (ref 3.5–5.1)
Sodium: 137 mEq/L (ref 135–145)

## 2018-04-17 LAB — CBC
HCT: 41.6 % (ref 36.0–46.0)
Hemoglobin: 14.1 g/dL (ref 12.0–15.0)
MCHC: 33.9 g/dL (ref 30.0–36.0)
MCV: 96.5 fl (ref 78.0–100.0)
Platelets: 215 10*3/uL (ref 150.0–400.0)
RBC: 4.31 Mil/uL (ref 3.87–5.11)
RDW: 13.5 % (ref 11.5–15.5)
WBC: 11.3 10*3/uL — ABNORMAL HIGH (ref 4.0–10.5)

## 2018-04-17 LAB — TSH: TSH: 1.76 u[IU]/mL (ref 0.35–4.50)

## 2018-04-17 NOTE — Patient Instructions (Signed)
BEFORE YOU LEAVE: -labs -follow up: 3-4 months  We have ordered labs or studies at this visit. It can take up to 1-2 weeks for results and processing. IF results require follow up or explanation, we will call you with instructions. Clinically stable results will be released to your [facility]. If you have not heard from [place] or cannot find your results in [facility] in 2 weeks please contact our office at [phone].  If you are not yet signed up for [facility], please consider signing up.

## 2018-04-23 MED ORDER — LEVOTHYROXINE SODIUM 88 MCG PO TABS: ORAL_TABLET | ORAL | 1 refills | Status: DC

## 2018-04-23 MED ORDER — LEVOTHYROXINE SODIUM 75 MCG PO TABS: ORAL_TABLET | ORAL | 1 refills | Status: DC

## 2018-04-23 NOTE — Addendum Note (Signed)
Addended by: Agnes Lawrence on: 04/23/2018 01:31 PM   Modules accepted: Orders

## 2018-04-23 NOTE — Addendum Note (Signed)
Addended by: Agnes Lawrence on: 04/23/2018 01:36 PM   Modules accepted: Orders

## 2018-05-07 ENCOUNTER — Encounter: Payer: Self-pay | Admitting: Family Medicine

## 2018-05-07 ENCOUNTER — Ambulatory Visit (INDEPENDENT_AMBULATORY_CARE_PROVIDER_SITE_OTHER): Payer: Medicare Other | Admitting: *Deleted

## 2018-05-07 DIAGNOSIS — I480 Paroxysmal atrial fibrillation: Secondary | ICD-10-CM

## 2018-05-08 NOTE — Progress Notes (Signed)
Carelink Summary Report / Loop Recorder 

## 2018-05-22 LAB — CUP PACEART REMOTE DEVICE CHECK
Implantable Pulse Generator Implant Date: 20180905
MDC IDC SESS DTM: 20190718003545

## 2018-05-30 ENCOUNTER — Other Ambulatory Visit (INDEPENDENT_AMBULATORY_CARE_PROVIDER_SITE_OTHER): Payer: Medicare Other

## 2018-05-30 DIAGNOSIS — D72829 Elevated white blood cell count, unspecified: Secondary | ICD-10-CM | POA: Diagnosis not present

## 2018-05-30 LAB — CBC WITH DIFFERENTIAL/PLATELET
BASOS PCT: 0.8 % (ref 0.0–3.0)
Basophils Absolute: 0.1 10*3/uL (ref 0.0–0.1)
Eosinophils Absolute: 0.2 10*3/uL (ref 0.0–0.7)
Eosinophils Relative: 3 % (ref 0.0–5.0)
HCT: 39.1 % (ref 36.0–46.0)
Hemoglobin: 13.4 g/dL (ref 12.0–15.0)
LYMPHS ABS: 1.8 10*3/uL (ref 0.7–4.0)
Lymphocytes Relative: 25.4 % (ref 12.0–46.0)
MCHC: 34.3 g/dL (ref 30.0–36.0)
MCV: 95.7 fl (ref 78.0–100.0)
MONO ABS: 0.7 10*3/uL (ref 0.1–1.0)
Monocytes Relative: 9.8 % (ref 3.0–12.0)
NEUTROS PCT: 61 % (ref 43.0–77.0)
Neutro Abs: 4.3 10*3/uL (ref 1.4–7.7)
Platelets: 205 10*3/uL (ref 150.0–400.0)
RBC: 4.09 Mil/uL (ref 3.87–5.11)
RDW: 13.5 % (ref 11.5–15.5)
WBC: 7 10*3/uL (ref 4.0–10.5)

## 2018-06-05 ENCOUNTER — Ambulatory Visit: Payer: Medicare Other | Admitting: Family Medicine

## 2018-06-05 ENCOUNTER — Encounter: Payer: Self-pay | Admitting: Family Medicine

## 2018-06-05 VITALS — BP 110/70 | HR 78 | Temp 98.4°F | Ht 62.25 in

## 2018-06-05 DIAGNOSIS — H00012 Hordeolum externum right lower eyelid: Secondary | ICD-10-CM | POA: Diagnosis not present

## 2018-06-05 DIAGNOSIS — Z23 Encounter for immunization: Secondary | ICD-10-CM | POA: Diagnosis not present

## 2018-06-05 MED ORDER — ERYTHROMYCIN 5 MG/GM OP OINT: 1.0000 "application " | TOPICAL_OINTMENT | Freq: Every day | OPHTHALMIC | 0 refills | Status: DC

## 2018-06-05 NOTE — Patient Instructions (Addendum)
?   Flu shot  Start the erythromycin ointment before bed and in the morning if able for 5 days  Compresses a few times per day  See your opthomologist if worsening or not resolving over the next 5-7 days

## 2018-06-05 NOTE — Addendum Note (Signed)
Addended by: Agnes Lawrence on: 06/05/2018 03:42 PM   Modules accepted: Orders

## 2018-06-05 NOTE — Progress Notes (Signed)
HPI:  Using dictation device. Unfortunately this device frequently misinterprets words/phrases.  Acute visit for stye: -started a few days ago -bump on lower R eye -tried to treat with otc stye tx -has not resolved and had a little pus from it -no fevers, malaise, swelling elsewhere, vision disturbance  ROS: See pertinent positives and negatives per HPI.  Past Medical History:  Diagnosis Date  . Acquired renal cyst of right kidney   . Anxiety   . Atrial fibrillation and flutter ([place])    s/p PVI and CTI by Dr Rayann Heman 04/2013  . Blood transfusion   . Cataract   . CHEST WALL PAIN, HX OF 07/28/2007   Qualifier: Diagnosis of  By: Julien Girt CMA, [name]    . Diverticulosis of colon   . DJD (degenerative joint disease) 10/18/2011  . Erysipelas   . Fibromyalgia   . GERD (gastroesophageal reflux disease)   . Hiatal hernia   . Hot flashes - on low dose HRT with gynecologist, Dr. Nori Riis 03/31/2014  . Hx of colonic polyps   . Hyperlipidemia   . MITRAL VALVE PROLAPSE 07/28/2007   Qualifier: Diagnosis of  By: Julien Girt CMA, [name]    . Supraventricular tachycardia ([place])    AV nodal reentry  s/p RFCA 2001  . Thyroid disease     Past Surgical History:  Procedure Laterality Date  . ABDOMINAL HYSTERECTOMY    . ATRIAL FIBRILLATION ABLATION N/A 04/30/2013   Procedure: ATRIAL FIBRILLATION ABLATION;  Surgeon: [name], MD;  Location: [facility] CATH LAB;  Service: Cardiovascular;  Laterality: N/A;PVI and CTI by Dr Rayann Heman   . BREAST BIOPSY Left    benign  . BREAST BIOPSY  2011   benign  . CATARACT EXTRACTION BILATERAL W/ ANTERIOR VITRECTOMY    . COLONOSCOPY    . COLONOSCOPY    . ESOPHAGOGASTRODUODENOSCOPY    . KNEE ARTHROSCOPY  2012  . LOOP RECORDER INSERTION N/A 05/24/2017   Procedure: LOOP RECORDER INSERTION;  Surgeon: [name], MD;  Location: [place] CV LAB;  Service: Cardiovascular;  Laterality: N/A;  . OOPHORECTOMY    . ROTATOR CUFF REPAIR  08/2001   Dr. Gladstone Lighter  . SVT ABLATION N/A  07/20/2017   Procedure: SVT ABLATION;  Surgeon: [name], MD;  Location: [place] CV LAB;  Service: Cardiovascular;  Laterality: N/A;  . TEE WITHOUT CARDIOVERSION N/A 04/29/2013   Procedure: TRANSESOPHAGEAL ECHOCARDIOGRAM (TEE);  Surgeon: Jettie Booze, MD;  Location: [place];  Service: Cardiovascular;  Laterality: N/A;  . UPPER GASTROINTESTINAL ENDOSCOPY      History reviewed. No pertinent family history.  SOCIAL HX: see hpi   Current Outpatient Medications:  .  acetaminophen (TYLENOL) 500 MG tablet, Take 500 mg by mouth daily as needed for moderate pain or headache., Disp: , Rfl:  .  Cyanocobalamin (B-12) 5000 MCG SUBL, Place 5,000 mcg under the tongue daily., Disp: , Rfl:  .  docusate sodium (COLACE) 100 MG capsule, Take 100 mg by mouth every evening. , Disp: , Rfl:  .  estradiol (VIVELLE-DOT) 0.05 MG/24HR, Place 0.5 patches onto the skin 2 (two) times a week. Wed and Sat, Disp: , Rfl:  .  Hypromellose (ARTIFICIAL TEARS OP), Apply 1 drop to eye daily as needed (dry eyes)., Disp: , Rfl:  .  levothyroxine (SYNTHROID, LEVOTHROID) 75 MCG tablet, TAKE 1 TABLET BY MOUTH 4 DAYS PER WEEK, Disp: 48 tablet, Rfl: 1 .  levothyroxine (SYNTHROID, LEVOTHROID) 88 MCG tablet, Take 61mcg by mouth 3 days per week and take 96mcg  on all other days, Disp: 36 tablet, Rfl: 1 .  psyllium (METAMUCIL) 58.6 % powder, Take 1 packet by mouth daily as needed (fiber). , Disp: , Rfl:  .  SAVAYSA 60 MG TABS tablet, TAKE ONE TABLET BY MOUTH ONCE DAILY, Disp: 30 tablet, Rfl: 11 .  verapamil (CALAN-SR) 120 MG CR tablet, Take 1 tablet (120 mg total) by mouth daily., Disp: 90 tablet, Rfl: 3 .  VITAMIN D, CHOLECALCIFEROL, PO, Take 4,000 Units by mouth daily., Disp: , Rfl:  .  erythromycin ophthalmic ointment, Place 1 application into the right eye at bedtime., Disp: 3.5 g, Rfl: 0  EXAM:  Vitals:   06/05/18 1502  BP: 110/70  Pulse: 78  Temp: 98.4 F (36.9 C)    Body mass index is 26.13  kg/m.  GENERAL: vitals reviewed and listed above, alert, oriented, appears well hydrated and in no acute distress  HEENT: atraumatic, conjunttiva clear, no obvious abnormalities on inspection of external nose and ears, small erythematous papule R lower eyelid with some mild focal surrounding erythema of the lid   NECK: no obvious masses on inspection  MS: moves all extremities without noticeable abnormality  PSYCH: pleasant and cooperative, no obvious depression or anxiety  ASSESSMENT AND PLAN:  Discussed the following assessment and plan:  Hordeolum externum of right lower eyelid  -opted for adding erythro optho ointment for 5 days, compresses and optho eval if worsening or persists (she has opthomologist and agrees to contact them if needed)   Patient Instructions  Start the erythromycin ointment before bed and in the morning if able for 5 days  Compresses a few times per day  See your opthomologist if worsening or not resolving over the next 5-7 days   Lucretia Kern, DO

## 2018-06-11 ENCOUNTER — Ambulatory Visit (INDEPENDENT_AMBULATORY_CARE_PROVIDER_SITE_OTHER): Payer: Medicare Other | Admitting: *Deleted

## 2018-06-11 DIAGNOSIS — I480 Paroxysmal atrial fibrillation: Secondary | ICD-10-CM

## 2018-06-11 LAB — CUP PACEART REMOTE DEVICE CHECK
Date Time Interrogation Session: 20190820013540
Implantable Pulse Generator Implant Date: 20180905

## 2018-06-11 NOTE — Progress Notes (Signed)
Carelink Summary Report / Loop Recorder 

## 2018-06-18 LAB — CUP PACEART REMOTE DEVICE CHECK
Date Time Interrogation Session: 20190922014147
Implantable Pulse Generator Implant Date: 20180905

## 2018-06-19 ENCOUNTER — Other Ambulatory Visit: Payer: Medicare Other

## 2018-06-19 DIAGNOSIS — M25561 Pain in right knee: Secondary | ICD-10-CM | POA: Diagnosis not present

## 2018-06-19 DIAGNOSIS — M25569 Pain in unspecified knee: Secondary | ICD-10-CM | POA: Diagnosis not present

## 2018-06-19 DIAGNOSIS — M1711 Unilateral primary osteoarthritis, right knee: Secondary | ICD-10-CM | POA: Diagnosis not present

## 2018-06-19 DIAGNOSIS — M7542 Impingement syndrome of left shoulder: Secondary | ICD-10-CM | POA: Diagnosis not present

## 2018-07-09 DIAGNOSIS — M25561 Pain in right knee: Secondary | ICD-10-CM | POA: Diagnosis not present

## 2018-07-12 ENCOUNTER — Ambulatory Visit (INDEPENDENT_AMBULATORY_CARE_PROVIDER_SITE_OTHER): Payer: Medicare Other | Admitting: *Deleted

## 2018-07-12 DIAGNOSIS — I480 Paroxysmal atrial fibrillation: Secondary | ICD-10-CM | POA: Diagnosis not present

## 2018-07-13 NOTE — Progress Notes (Signed)
Carelink Summary Report / Loop Recorder 

## 2018-07-16 NOTE — Progress Notes (Signed)
HPI:  Using dictation device. Unfortunately this device frequently misinterprets words/phrases.  Marissa Hart is a pleasant 83 y.o. here for follow up. Chronic medical problems summarized below were reviewed for changes and stability and were updated as needed below. These issues and their treatment remain stable for the most part.  Doing well. No complaints. Denies CP, SOB, DOE, treatment intolerance or new symptoms.  AWV/CPE 10/12/17  Hypothyroidism: -on levothyroxine: 50mcg 5 days, 24mcg 2 days -doing well  Hot Flashes: -on vivelle dot -sees gynecologist for this, on very low dose of transdermal estrogen, s/p hysterectomy  Dyslipidemia: -low hdl -she does not want to check further - reports would never take medications for this -doing healthy lifestyle  PAF, atrial flutter, PACs: -followed by cardiology/electrophysiology  -s/p ablation in 2014,attempted in 2018, implanted monitor in 2018 -on edoxaban (savaysa)and now on verapamil daily and doing better  Bereavment: -husband passed - grieving but doing well -anniversaries and holidays are tough -she is in a widows group at church and this has been very helpful, feels is coping well    ROS: See pertinent positives and negatives per HPI.  Past Medical History:  Diagnosis Date  . Acquired renal cyst of right kidney   . Anxiety   . Atrial fibrillation and flutter ([place])    s/p PVI and CTI by Dr Rayann Heman 04/2013  . Blood transfusion   . Cataract   . CHEST WALL PAIN, HX OF 07/28/2007   Qualifier: Diagnosis of  By: Julien Girt CMA, [name]    . Diverticulosis of colon   . DJD (degenerative joint disease) 10/18/2011  . Erysipelas   . Fibromyalgia   . GERD (gastroesophageal reflux disease)   . Hiatal hernia   . Hot flashes - on low dose HRT with gynecologist, Dr. Nori Riis 03/31/2014  . Hx of colonic polyps   . Hyperlipidemia   . MITRAL VALVE PROLAPSE 07/28/2007   Qualifier: Diagnosis of  By: Julien Girt CMA, [name]    .  Supraventricular tachycardia ([place])    AV nodal reentry  s/p RFCA 2001  . Thyroid disease     Past Surgical History:  Procedure Laterality Date  . ABDOMINAL HYSTERECTOMY    . ATRIAL FIBRILLATION ABLATION N/A 04/30/2013   Procedure: ATRIAL FIBRILLATION ABLATION;  Surgeon: [name], MD;  Location: [facility] CATH LAB;  Service: Cardiovascular;  Laterality: N/A;PVI and CTI by Dr Rayann Heman   . BREAST BIOPSY Left    benign  . BREAST BIOPSY  2011   benign  . CATARACT EXTRACTION BILATERAL W/ ANTERIOR VITRECTOMY    . COLONOSCOPY    . COLONOSCOPY    . ESOPHAGOGASTRODUODENOSCOPY    . KNEE ARTHROSCOPY  2012  . LOOP RECORDER INSERTION N/A 05/24/2017   Procedure: LOOP RECORDER INSERTION;  Surgeon: [name], MD;  Location: [facility] LAB;  Service: Cardiovascular;  Laterality: N/A;  . OOPHORECTOMY    . ROTATOR CUFF REPAIR  08/2001   Dr. Gladstone Lighter  . SVT ABLATION N/A 07/20/2017   Procedure: SVT ABLATION;  Surgeon: [name], MD;  Location: [facility];  Service: Cardiovascular;  Laterality: N/A;  . TEE WITHOUT CARDIOVERSION N/A 04/29/2013   Procedure: TRANSESOPHAGEAL ECHOCARDIOGRAM (TEE);  Surgeon: Jettie Booze, MD;  Location: [facility];  Service: Cardiovascular;  Laterality: N/A;  . UPPER GASTROINTESTINAL ENDOSCOPY      History reviewed. No pertinent family history.  SOCIAL HX: see hpi   Current Outpatient Medications:  .  acetaminophen (TYLENOL) 500 MG tablet, Take 500 mg by mouth  daily as needed for moderate pain or headache., Disp: , Rfl:  .  Cyanocobalamin (B-12) 5000 MCG SUBL, Place 5,000 mcg under the tongue daily., Disp: , Rfl:  .  docusate sodium (COLACE) 100 MG capsule, Take 100 mg by mouth every evening. , Disp: , Rfl:  .  erythromycin ophthalmic ointment, Place 1 application into the right eye at bedtime., Disp: 3.5 g, Rfl: 0 .  estradiol (VIVELLE-DOT) 0.05 MG/24HR, Place 0.5 patches onto the skin 2 (two) times a week. Wed and Sat, Disp: , Rfl:  .  Hypromellose  (ARTIFICIAL TEARS OP), Apply 1 drop to eye daily as needed (dry eyes)., Disp: , Rfl:  .  levothyroxine (SYNTHROID, LEVOTHROID) 75 MCG tablet, TAKE 1 TABLET BY MOUTH 4 DAYS PER WEEK, Disp: 48 tablet, Rfl: 1 .  levothyroxine (SYNTHROID, LEVOTHROID) 88 MCG tablet, Take 20mcg by mouth 3 days per week and take 69mcg on all other days, Disp: 36 tablet, Rfl: 1 .  psyllium (METAMUCIL) 58.6 % powder, Take 1 packet by mouth daily as needed (fiber). , Disp: , Rfl:  .  SAVAYSA 60 MG TABS tablet, TAKE ONE TABLET BY MOUTH ONCE DAILY, Disp: 30 tablet, Rfl: 11 .  verapamil (CALAN-SR) 120 MG CR tablet, Take 1 tablet (120 mg total) by mouth daily., Disp: 90 tablet, Rfl: 3 .  VITAMIN D, CHOLECALCIFEROL, PO, Take 4,000 Units by mouth daily., Disp: , Rfl:   EXAM:  Vitals:   07/17/18 1106  BP: 118/68  Pulse: 74  Temp: 98.3 F (36.8 C)    Body mass index is 26.11 kg/m.  GENERAL: vitals reviewed and listed above, alert, oriented, appears well hydrated and in no acute distress  HEENT: atraumatic, conjunttiva clear, no obvious abnormalities on inspection of external nose and ears  NECK: no obvious masses on inspection  LUNGS: clear to auscultation bilaterally, no wheezes, rales or rhonchi, good air movement  CV: HRRR, no peripheral edema  MS: moves all extremities without noticeable abnormality  PSYCH: pleasant and cooperative, no obvious depression or anxiety  ASSESSMENT AND PLAN:  Discussed the following assessment and plan:  Paroxysmal atrial fibrillation (HCC)  Hypothyroidism, unspecified type  Tachyarrhythmia  -doing well -labs good recent check and she opted to recheck at physical -follow up [place] in Feb with labs then -lifestyle recs per handout -follow up sooner as needed  Patient Instructions  BEFORE YOU LEAVE: -follow up: AWV with [name] and follow up with Dr. Maudie Mercury in February   We recommend the following healthy lifestyle for LIFE: 1) Small portions. But, make sure to get  regular (at least 3 per day), healthy meals and small healthy snacks if needed.  2) Eat a healthy clean diet.   TRY TO EAT: -at least 5-7 servings of low sugar, colorful, and nutrient rich vegetables per day (not corn, potatoes or bananas.) -berries are the best choice if you wish to eat fruit (only eat small amounts if trying to reduce weight)  -lean meets (fish, white meat of chicken or [place]) -vegan proteins for some meals - beans or tofu, whole grains, nuts and seeds -Replace bad fats with good fats - good fats include: fish, nuts and seeds, canola oil, olive oil -small amounts of low fat or non fat dairy -small amounts of100 % whole grains - check the lables -drink plenty of water  AVOID: -SUGAR, sweets, anything with added sugar, corn syrup or sweeteners - must read labels as even foods advertised as "healthy" often are loaded with sugar -if you must  have a sweetener, small amounts of stevia may be best -sweetened beverages and artificially sweetened beverages -simple starches (rice, bread, potatoes, pasta, chips, etc - small amounts of 100% whole grains are ok) -red meat, pork, butter -fried foods, fast food, processed food, excessive dairy, eggs and coconut.  3)Get at least 150 minutes of sweaty aerobic exercise per week.  4)Reduce stress - consider counseling, meditation and relaxation to balance other aspects of your life.     Lucretia Kern, DO

## 2018-07-17 ENCOUNTER — Encounter: Payer: Self-pay | Admitting: Family Medicine

## 2018-07-17 ENCOUNTER — Ambulatory Visit: Payer: Medicare Other | Admitting: Family Medicine

## 2018-07-17 VITALS — BP 118/68 | HR 74 | Temp 98.3°F | Ht 62.25 in | Wt 143.9 lb

## 2018-07-17 DIAGNOSIS — R000 Tachycardia, unspecified: Secondary | ICD-10-CM | POA: Diagnosis not present

## 2018-07-17 DIAGNOSIS — I480 Paroxysmal atrial fibrillation: Secondary | ICD-10-CM

## 2018-07-17 DIAGNOSIS — E039 Hypothyroidism, unspecified: Secondary | ICD-10-CM | POA: Diagnosis not present

## 2018-07-17 NOTE — Patient Instructions (Signed)
BEFORE YOU LEAVE: -follow up: AWV with [name] and follow up with Dr. Maudie Mercury in February   We recommend the following healthy lifestyle for LIFE: 1) Small portions. But, make sure to get regular (at least 3 per day), healthy meals and small healthy snacks if needed.  2) Eat a healthy clean diet.   TRY TO EAT: -at least 5-7 servings of low sugar, colorful, and nutrient rich vegetables per day (not corn, potatoes or bananas.) -berries are the best choice if you wish to eat fruit (only eat small amounts if trying to reduce weight)  -lean meets (fish, white meat of chicken or [place]) -vegan proteins for some meals - beans or tofu, whole grains, nuts and seeds -Replace bad fats with good fats - good fats include: fish, nuts and seeds, canola oil, olive oil -small amounts of low fat or non fat dairy -small amounts of100 % whole grains - check the lables -drink plenty of water  AVOID: -SUGAR, sweets, anything with added sugar, corn syrup or sweeteners - must read labels as even foods advertised as "healthy" often are loaded with sugar -if you must have a sweetener, small amounts of stevia may be best -sweetened beverages and artificially sweetened beverages -simple starches (rice, bread, potatoes, pasta, chips, etc - small amounts of 100% whole grains are ok) -red meat, pork, butter -fried foods, fast food, processed food, excessive dairy, eggs and coconut.  3)Get at least 150 minutes of sweaty aerobic exercise per week.  4)Reduce stress - consider counseling, meditation and relaxation to balance other aspects of your life.

## 2018-07-27 LAB — CUP PACEART REMOTE DEVICE CHECK
Date Time Interrogation Session: 20191025023907
MDC IDC PG IMPLANT DT: 20180905

## 2018-07-31 DIAGNOSIS — M25561 Pain in right knee: Secondary | ICD-10-CM | POA: Diagnosis not present

## 2018-08-06 DIAGNOSIS — M25561 Pain in right knee: Secondary | ICD-10-CM | POA: Diagnosis not present

## 2018-08-06 IMAGING — US US SOFT TISSUE HEAD/NECK
1 series · 13 of 25 positions shown · non-contrast
Comparison: None.

CLINICAL DATA: 81-year-old female with thyroid fullness on physical
exam

EXAM:
THYROID ULTRASOUND
TECHNIQUE: Ultrasound examination of the thyroid gland and adjacent soft
tissues was performed.

[Series 1: us soft tissue head/neck · 0.04mm/px · 13 of 36 slices shown]
[im 1/36]
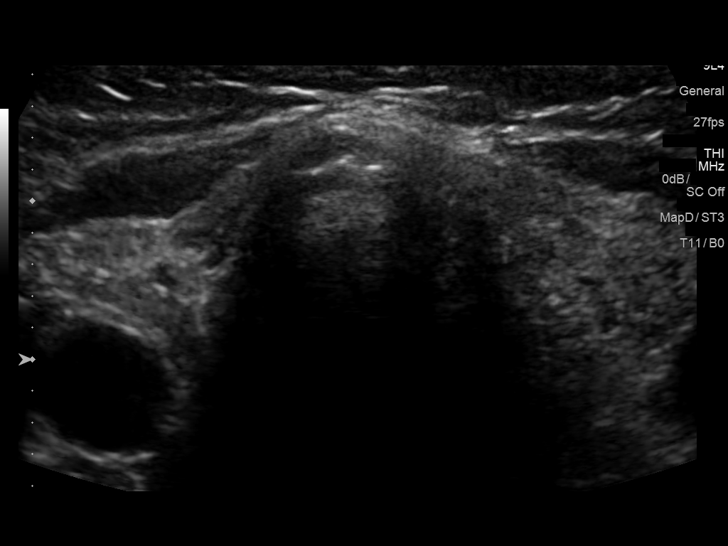
[im 3/36]
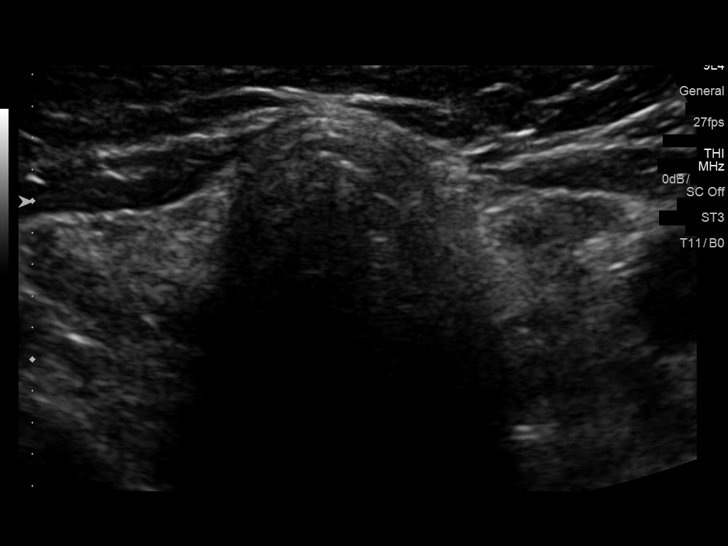
[im 6/36]
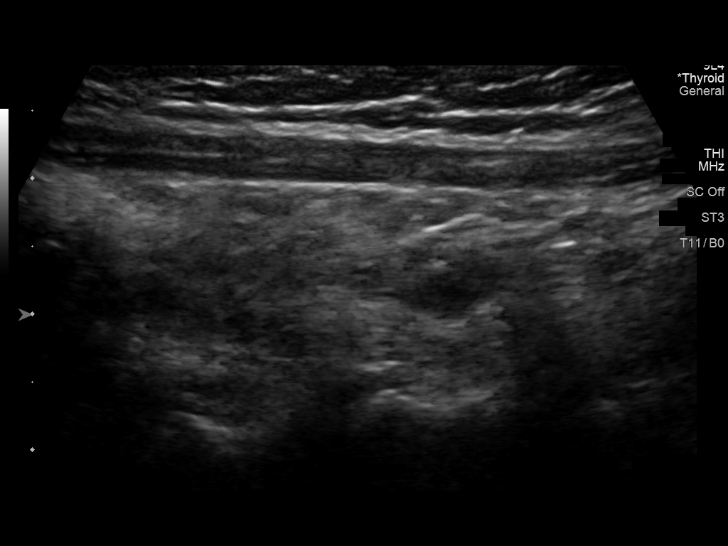
[im 9/36]
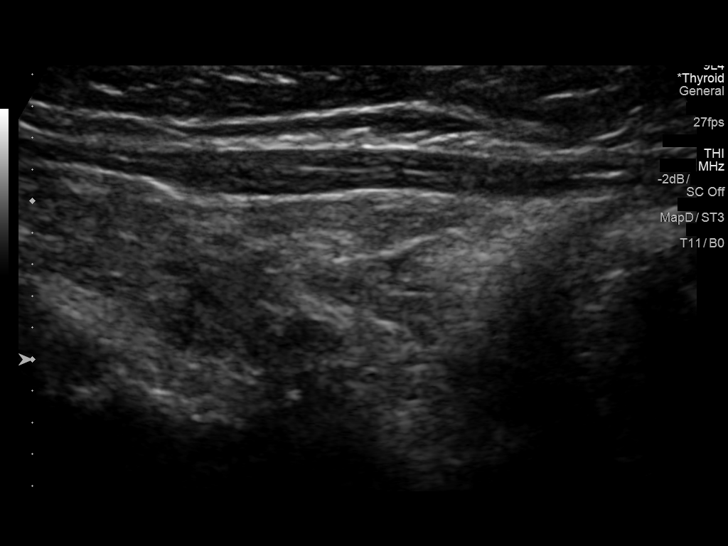
[im 12/36]
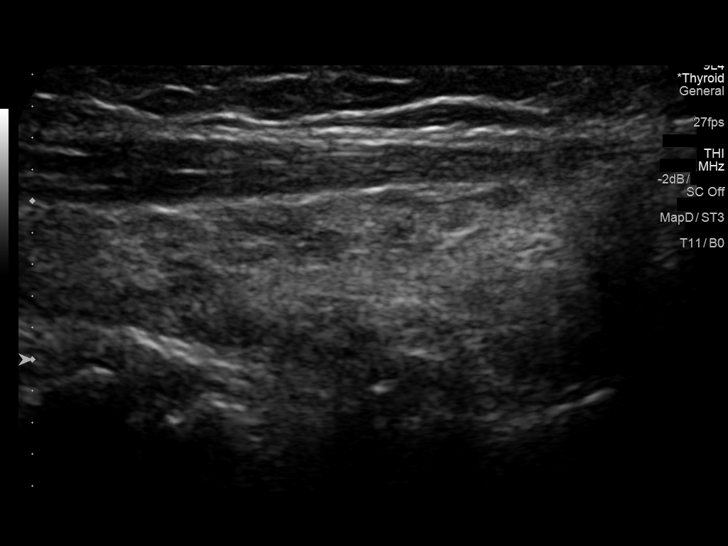
[im 15/36]
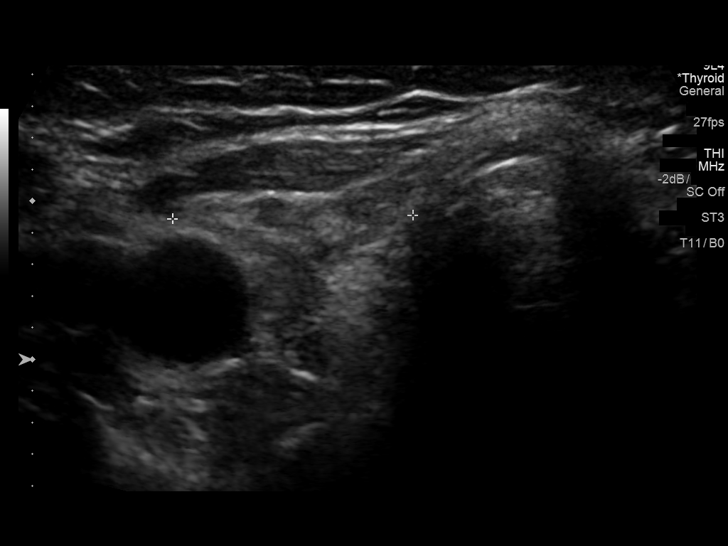
[im 18/36]
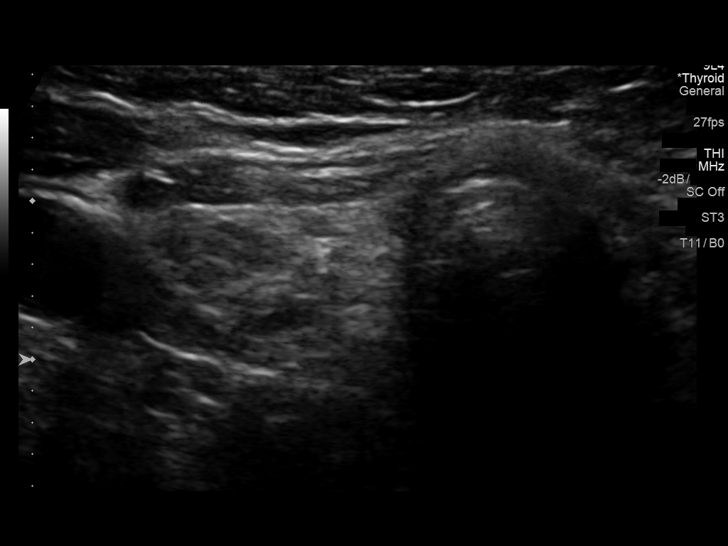
[im 21/36]
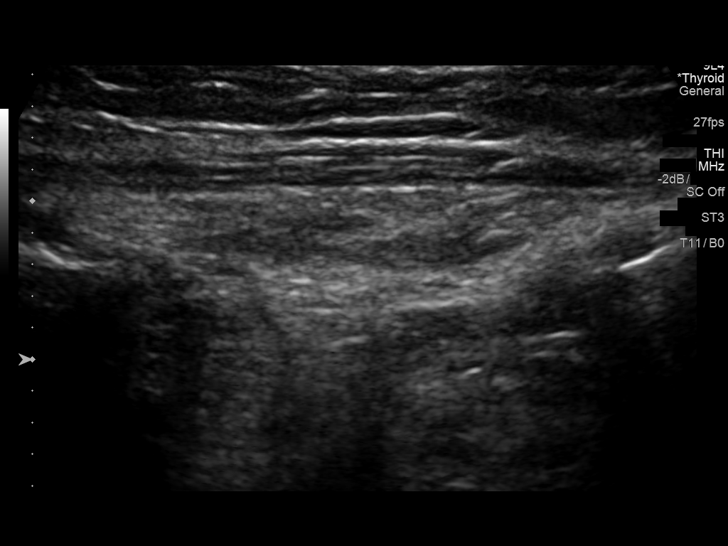
[im 24/36]
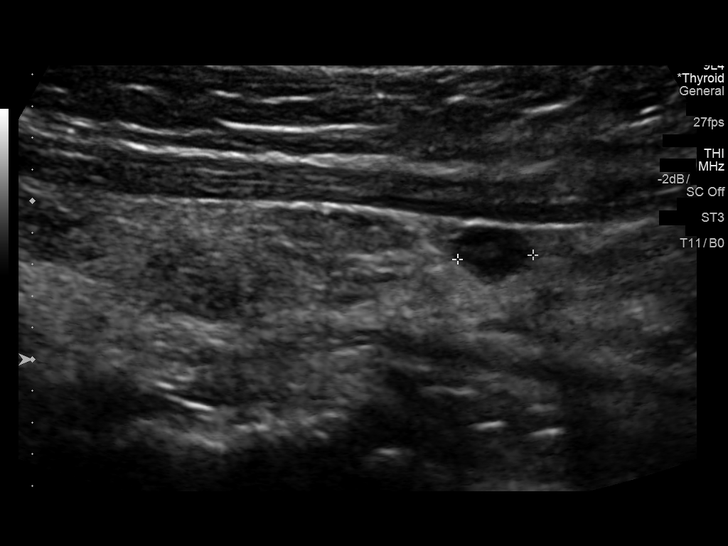
[im 27/36]
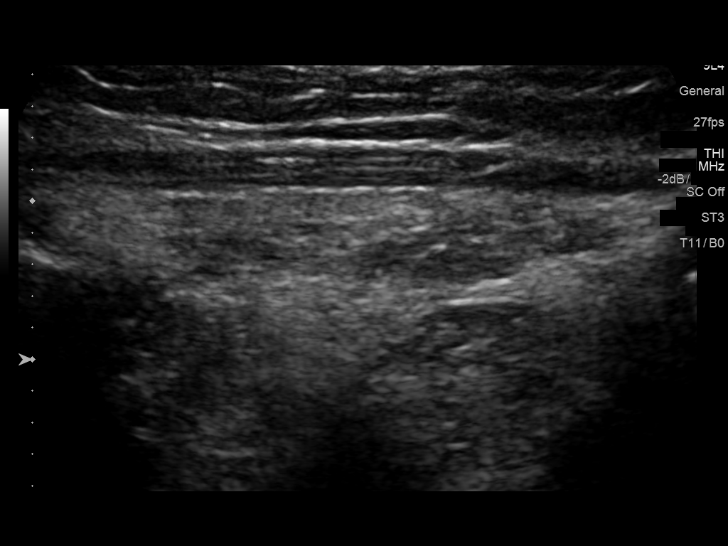
[im 30/36]
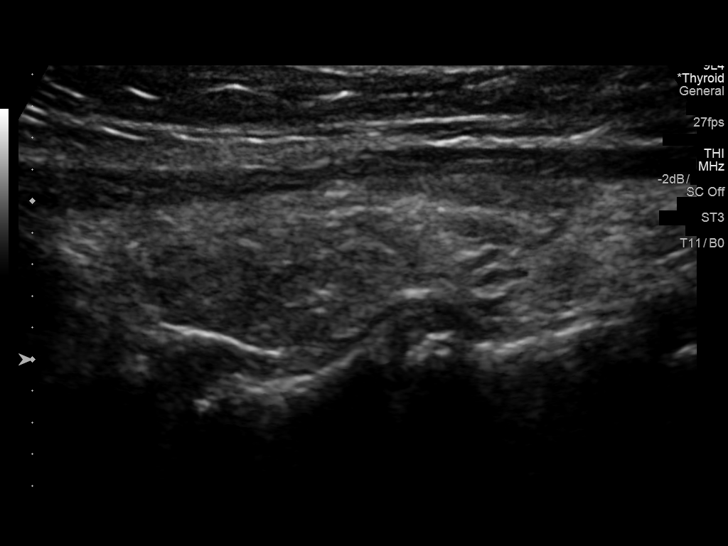
[im 33/36]
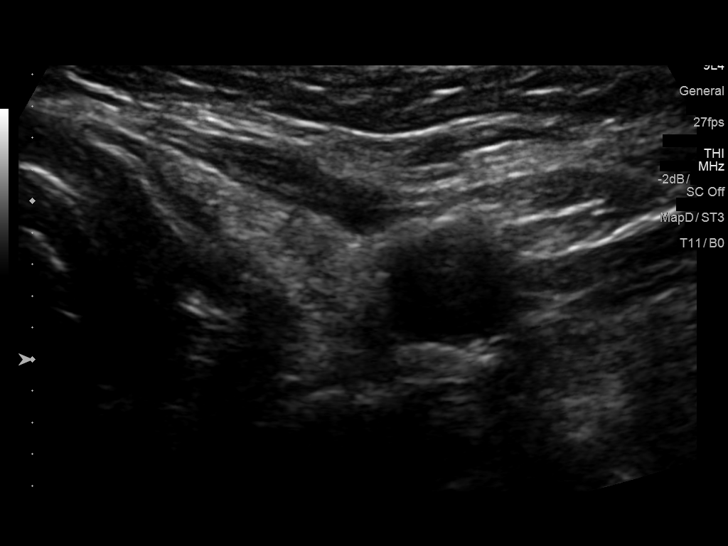
[im 36/36]
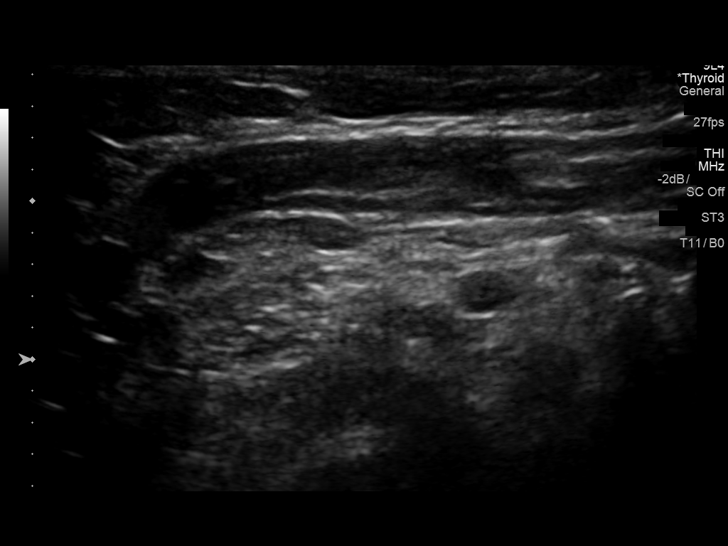

[13 of 25 positions shown; findings below may reference images not displayed]

FINDINGS: Parenchymal Echotexture: Markedly heterogenous

Isthmus: 0.3 cm

Right lobe: 4.7 x 1.7 x 1.5 cm

Left lobe: 3.9 x 1.1 x 1.5 cm

_________________________________________________________

Estimated total number of nodules >/= 1 cm: 0

Number of spongiform nodules >/=  2 cm not described below (TR1): 0

Number of mixed cystic and solid nodules >/= 1.5 cm not described
below (TR2): 0

_________________________________________________________

Diffusely heterogeneous thyroid parenchyma. There is a solitary
anechoic cyst measuring 0.5 cm in the left lower pole. This cyst
does not meet criteria for either dedicated imaging follow-up or
biopsy.
IMPRESSION: 1. Diffusely heterogeneous and mildly enlarged thyroid gland.
Imaging findings suggest chronic thyroiditis or thyroid hormone
replacement therapy.
2. There are no discrete nodules which require imaging surveillance
or biopsy.

The above is in keeping with the ACR TI-RADS recommendations - [HOSPITAL] 2556;[DATE].

## 2018-08-08 ENCOUNTER — Telehealth: Payer: Self-pay

## 2018-08-08 NOTE — Telephone Encounter (Signed)
   [facility] Pre-operative Risk Assessment    Request for surgical clearance:  1. What type of surgery is being performed? RIGHT KNEE ARTHROSCOPY, LATERAL MENISECTOMY   2. When is this surgery scheduled?  TBD   3. What type of clearance is required (medical clearance vs. Pharmacy clearance to hold med vs. Both)?  MEDICAL  4. Are there any medications that need to be held prior to surgery and how long?    5. Practice name and name of physician performing surgery?  [facility]/ Dr Gladstone Lighter   6. What is your office phone number [phone]    7.   What is your office fax number [phone]  8.   Anesthesia type (None, local, MAC, general) ? general   Frederik Schmidt 08/08/2018, 1:33 PM  _________________________________________________________________   (provider comments below)

## 2018-08-09 NOTE — Telephone Encounter (Signed)
LMTCB

## 2018-08-10 ENCOUNTER — Telehealth: Payer: Self-pay | Admitting: Cardiology

## 2018-08-10 NOTE — Telephone Encounter (Signed)
Follow up  ° ° °Patient is returning call.  °

## 2018-08-10 NOTE — Telephone Encounter (Signed)
New Message           Patient returned your call would like a call back at 4 or later. She has gone to a [employer].

## 2018-08-10 NOTE — Telephone Encounter (Signed)
Patient with diagnosis of atrial fibrillaton on Savaysa for anticoagulation.    Procedure: right knee arthroscopy, lateral menisectomy Date of procedure: TBD  CHADS2-VASc score of  3 (, AGE, , AGE, female)  CrCl 54.8 Platelet count 205  Per office protocol, patient can hold Savaysa for 3 days prior to procedure.    If not bridging, patient should restart Savaysa on the evening of procedure or day after, at discretion of procedure MD  For orthopedic procedures please be sure to resume therapeutic (not prophylactic) dosing.

## 2018-08-10 NOTE — Telephone Encounter (Signed)
   Primary Cardiologist: [name], MD  Chart reviewed as part of pre-operative protocol coverage. Patient was contacted 08/10/2018 in reference to pre-operative risk assessment for pending surgery as outlined below.  Marissa Hart was last seen on 02/14/18 by Dr. Rayann Heman.  Since that day, Marissa Hart has done very well. Her linq recorder recently showed one brief episode of SVT 0.1% Afib burden. She says that she has really not been bothered by it and has had no of the activity intolerance that she previously had since medically controlled.   Therefore, based on ACC/AHA guidelines, the patient would be at acceptable risk for the planned procedure without further cardiovascular testing.   Anticoagulation: Patient with diagnosis of atrial fibrillaton on Savaysa for anticoagulation.    Procedure: right knee arthroscopy, lateral menisectomy Date of procedure: TBD  CHADS2-VASc score of  3 (, AGE, , AGE, female) CrCl 54.8 Platelet count 205  Per office protocol, patient can hold Savaysa for 3 days prior to procedure.    If not bridging, patient should restart Savaysa on the evening of procedure or day after, at discretion of procedure MD  For orthopedic procedures please be sure to resume therapeutic (not prophylactic) dosing.   I will route this recommendation to the requesting party via Epic fax function and remove from pre-op pool.  Please call with questions.  Daune Perch, NP 08/10/2018, 4:38 PM

## 2018-08-10 NOTE — Telephone Encounter (Signed)
   Primary Cardiologist: [name], MD  Chart reviewed as part of pre-operative protocol coverage. Patient was contacted 08/10/2018 in reference to pre-operative risk assessment for pending surgery as outlined below.  Marissa Hart was last seen on 02/14/18 by Dr. Rayann Heman.  Since that day, Marissa Hart has done very well. Her linq recorder recently showed one brief episode of SVT 0.1% Afib burden. She says that she has really not been bothered by it and has had no of the activity intolerance that she previously had since medically controlled.   Therefore, based on ACC/AHA guidelines, the patient would be at acceptable risk for the planned procedure without further cardiovascular testing.   I will obtain input by our pharmacist on anticoagulation and then will route this recommendation to the requesting party via Epic fax function and remove from pre-op pool.  Please call with questions.  Daune Perch, NP 08/10/2018, 4:38 PM

## 2018-08-14 ENCOUNTER — Ambulatory Visit (INDEPENDENT_AMBULATORY_CARE_PROVIDER_SITE_OTHER): Payer: Medicare Other

## 2018-08-14 DIAGNOSIS — I480 Paroxysmal atrial fibrillation: Secondary | ICD-10-CM

## 2018-08-15 NOTE — Progress Notes (Signed)
Carelink Summary Report / Loop Recorder 

## 2018-08-21 ENCOUNTER — Ambulatory Visit: Payer: Medicare Other | Admitting: Family Medicine

## 2018-08-21 ENCOUNTER — Other Ambulatory Visit: Payer: Self-pay | Admitting: Family Medicine

## 2018-08-21 NOTE — Telephone Encounter (Signed)
Pt states she is having surgery tomorrow and was hoping to get this Rx levothyroxine (SYNTHROID, LEVOTHROID) 88 MCG tablet Because she will not be able to go back out for several days. [place], [place] [phone] (Phone) [phone] (Fax)

## 2018-08-21 NOTE — Telephone Encounter (Signed)
Rx done. 

## 2018-08-22 DIAGNOSIS — G8918 Other acute postprocedural pain: Secondary | ICD-10-CM | POA: Diagnosis not present

## 2018-08-22 DIAGNOSIS — S83281A Other tear of lateral meniscus, current injury, right knee, initial encounter: Secondary | ICD-10-CM | POA: Diagnosis not present

## 2018-08-22 DIAGNOSIS — S83271A Complex tear of lateral meniscus, current injury, right knee, initial encounter: Secondary | ICD-10-CM | POA: Diagnosis not present

## 2018-08-22 DIAGNOSIS — M659 Synovitis and tenosynovitis, unspecified: Secondary | ICD-10-CM | POA: Diagnosis not present

## 2018-08-22 DIAGNOSIS — M1711 Unilateral primary osteoarthritis, right knee: Secondary | ICD-10-CM | POA: Diagnosis not present

## 2018-08-22 HISTORY — PX: KNEE SURGERY: SHX244

## 2018-09-04 ENCOUNTER — Telehealth: Payer: Self-pay | Admitting: Cardiology

## 2018-09-04 NOTE — Telephone Encounter (Signed)
Spoke w/ pt and requested that she send a manual transmission b/c her home monitor has not updated in at least 14 days.   

## 2018-09-17 ENCOUNTER — Ambulatory Visit (INDEPENDENT_AMBULATORY_CARE_PROVIDER_SITE_OTHER): Payer: Medicare Other

## 2018-09-17 DIAGNOSIS — R002 Palpitations: Secondary | ICD-10-CM | POA: Diagnosis not present

## 2018-09-17 NOTE — Progress Notes (Signed)
Carelink Summary Report / Loop Recorder 

## 2018-09-18 LAB — CUP PACEART REMOTE DEVICE CHECK
MDC IDC PG IMPLANT DT: 20180905
MDC IDC SESS DTM: 20191230030548

## 2018-09-20 ENCOUNTER — Encounter: Payer: Self-pay | Admitting: Nurse Practitioner

## 2018-09-20 ENCOUNTER — Ambulatory Visit: Payer: Medicare Other | Admitting: Nurse Practitioner

## 2018-09-20 VITALS — BP 132/64 | HR 85 | Ht 62.25 in | Wt 141.4 lb

## 2018-09-20 DIAGNOSIS — I471 Supraventricular tachycardia: Secondary | ICD-10-CM | POA: Diagnosis not present

## 2018-09-20 DIAGNOSIS — I480 Paroxysmal atrial fibrillation: Secondary | ICD-10-CM

## 2018-09-20 NOTE — Patient Instructions (Signed)
Medication Instructions:  none If you need a refill on your cardiac medications before your next appointment, please call your pharmacy.   Lab work: none If you have labs (blood work) drawn today and your tests are completely normal, you will receive your results only by: [name] Kitchen MyChart Message (if you have MyChart) OR . A paper copy in the mail If you have any lab test that is abnormal or we need to change your treatment, we will call you to review the results.  Testing/Procedures: none  Follow-Up: Administrator, arts 6 months At [facility], you and your health needs are our priority.  As part of our continuing mission to provide you with exceptional heart care, we have created designated Provider Care Teams.  These Care Teams include your primary Cardiologist (physician) and Advanced Practice Providers (APPs -  Physician Assistants and Nurse Practitioners) who all work together to provide you with the care you need, when you need it. .   Any Other Special Instructions Will Be Listed Below (If Applicable). Remote monitoring is used to monitor your Pacemaker from home. This monitoring reduces the number of office visits required to check your device to one time per year. It allows [place] to keep an eye on the functioning of your device to ensure it is working properly. You are scheduled for a device check from home on 10/19/18. You may send your transmission at any time that day. If you have a wireless device, the transmission will be sent automatically. After your physician reviews your transmission, you will receive a postcard with your next transmission date.

## 2018-09-20 NOTE — Progress Notes (Signed)
Electrophysiology Office Note Date: 09/20/2018  ID:  Marissa Hart, DOB [birth year], MRN [id number]  PCP: Lucretia Kern, DO Electrophysiologist: Allred  CC: AF follow up  Marissa Hart is a 84 y.o. female seen today for Dr Rayann Heman.  She presents today for routine electrophysiology followup.  Since last being seen in our clinic, the patient reports doing relatively well.  She had recent knee replacement surgery and had issues with constipation afterwards. During this, she had recurrent atrial arrhythmias that have since calmed down.  She is overall doing well.  She denies chest pain, dyspnea, PND, orthopnea, nausea, vomiting, dizziness, syncope, edema, weight gain, or early satiety.  Past Medical History:  Diagnosis Date  . Acquired renal cyst of right kidney   . Anxiety   . Atrial fibrillation and flutter ([place])    s/p PVI and CTI by Dr Rayann Heman 04/2013  . Blood transfusion   . Cataract   . CHEST WALL PAIN, HX OF 07/28/2007   Qualifier: Diagnosis of  By: Julien Girt CMA, [name]    . Diverticulosis of colon   . DJD (degenerative joint disease) 10/18/2011  . Erysipelas   . Fibromyalgia   . GERD (gastroesophageal reflux disease)   . Hiatal hernia   . Hot flashes - on low dose HRT with gynecologist, Dr. Nori Riis 03/31/2014  . Hx of colonic polyps   . Hyperlipidemia   . MITRAL VALVE PROLAPSE 07/28/2007   Qualifier: Diagnosis of  By: Julien Girt CMA, [name]    . Supraventricular tachycardia ([place])    AV nodal reentry  s/p RFCA 2001  . Thyroid disease    Past Surgical History:  Procedure Laterality Date  . ABDOMINAL HYSTERECTOMY    . ATRIAL FIBRILLATION ABLATION N/A 04/30/2013   Procedure: ATRIAL FIBRILLATION ABLATION;  Surgeon: [name], MD;  Location: [facility] CATH LAB;  Service: Cardiovascular;  Laterality: N/A;PVI and CTI by Dr Rayann Heman   . BREAST BIOPSY Left    benign  . BREAST BIOPSY  2011   benign  . CATARACT EXTRACTION BILATERAL W/ ANTERIOR VITRECTOMY    . COLONOSCOPY    . COLONOSCOPY      . ESOPHAGOGASTRODUODENOSCOPY    . KNEE ARTHROSCOPY  2012  . LOOP RECORDER INSERTION N/A 05/24/2017   Procedure: LOOP RECORDER INSERTION;  Surgeon: [name], MD;  Location: [facility] CV LAB;  Service: Cardiovascular;  Laterality: N/A;  . OOPHORECTOMY    . ROTATOR CUFF REPAIR  08/2001   Dr. Gladstone Lighter  . SVT ABLATION N/A 07/20/2017   Procedure: SVT ABLATION;  Surgeon: [name], MD;  Location: [place] CV LAB;  Service: Cardiovascular;  Laterality: N/A;  . TEE WITHOUT CARDIOVERSION N/A 04/29/2013   Procedure: TRANSESOPHAGEAL ECHOCARDIOGRAM (TEE);  Surgeon: Jettie Booze, MD;  Location: [place];  Service: Cardiovascular;  Laterality: N/A;  . UPPER GASTROINTESTINAL ENDOSCOPY      Current Outpatient Medications  Medication Sig Dispense Refill  . acetaminophen (TYLENOL) 500 MG tablet Take 500 mg by mouth daily as needed for moderate pain or headache.    . Cyanocobalamin (B-12) 5000 MCG SUBL Place 5,000 mcg under the tongue daily.    [name] Kitchen docusate sodium (COLACE) 100 MG capsule Take 100 mg by mouth every evening.     [name] Kitchen erythromycin ophthalmic ointment Place 1 application into the right eye at bedtime. 3.5 g 0  . estradiol (VIVELLE-DOT) 0.05 MG/24HR Place 0.5 patches onto the skin 2 (two) times a week. Wed and Sat    . Hypromellose (ARTIFICIAL  TEARS OP) Apply 1 drop to eye daily as needed (dry eyes).    [name] Kitchen levothyroxine (SYNTHROID, LEVOTHROID) 75 MCG tablet TAKE 1 TABLET BY MOUTH 4 DAYS PER WEEK 48 tablet 1  . levothyroxine (SYNTHROID, LEVOTHROID) 88 MCG tablet TAKE 88 MCG BY MOUTH 3 DAYS PER WEEK AND TAKE 75 MCG ON ALL OTHER DAYS 36 tablet 1  . psyllium (METAMUCIL) 58.6 % powder Take 1 packet by mouth daily as needed (fiber).     [name] Kitchen SAVAYSA 60 MG TABS tablet TAKE ONE TABLET BY MOUTH ONCE DAILY 30 tablet 11  . verapamil (CALAN-SR) 120 MG CR tablet Take 1 tablet (120 mg total) by mouth daily. 90 tablet 3  . VITAMIN D, CHOLECALCIFEROL, PO Take 4,000 Units by mouth daily.     No  current facility-administered medications for this visit.     Allergies:   Propafenone; Codeine; and Tramadol hcl   Social History: Social History   Socioeconomic History  . Marital status: Widowed    Spouse name: Not on file  . Number of children: 1  . Years of education: Not on file  . Highest education level: Not on file  Occupational History    Employer: RETIRED  Social Needs  . Financial resource strain: Not on file  . Food insecurity:    Worry: Not on file    Inability: Not on file  . Transportation needs:    Medical: Not on file    Non-medical: Not on file  Tobacco Use  . Smoking status: Former Smoker    Packs/day: 0.50    Years: 3.00    Pack years: 1.50    Types: Cigarettes    Last attempt to quit: 09/19/1966    Years since quitting: 52.0  . Smokeless tobacco: Never Used  Substance and Sexual Activity  . Alcohol use: Yes    Alcohol/week: 3.0 standard drinks    Types: 3 Glasses of wine per week    Comment: wine weekly  . Drug use: No  . Sexual activity: Not on file  Lifestyle  . Physical activity:    Days per week: Not on file    Minutes per session: Not on file  . Stress: Not on file  Relationships  . Social connections:    Talks on phone: Not on file    Gets together: Not on file    Attends religious service: Not on file    Active member of club or organization: Not on file    Attends meetings of clubs or organizations: Not on file    Relationship status: Not on file  . Intimate partner violence:    Fear of current or ex partner: Not on file    Emotionally abused: Not on file    Physically abused: Not on file    Forced sexual activity: Not on file  Other Topics Concern  . 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  Review of Systems: All other systems reviewed and are otherwise negative except as noted above.   Physical Exam: VS:  There were no vitals taken for this visit. , BMI There is no height or weight on file to calculate BMI. Wt Readings from Last 3 Encounters:  07/17/18 143 lb 14.4 oz (65.3 kg)  04/17/18 144 lb (65.3 kg)  02/14/18 144 lb (65.3 kg)    GEN- The patient is well appearing, alert and oriented x 3 today.   HEENT: normocephalic, atraumatic; sclera clear, conjunctiva pink; hearing intact; oropharynx clear; neck supple Lungs- Clear to ausculation bilaterally, normal work of breathing.  No wheezes, rales, rhonchi Heart- Regular rate and rhythm GI- soft, non-tender, non-distended, bowel sounds present, no hepatosplenomegaly Extremities- no clubbing, cyanosis, or edema MS- no significant deformity or atrophy Skin- warm and dry, no rash or lesion  Psych- euthymic mood, full affect Neuro- strength and sensation are intact   EKG:  EKG is not ordered today.  Recent Labs: 04/17/2018: BUN 11; Creatinine, Ser 0.83; Potassium 4.7; Sodium 137; TSH 1.76 05/30/2018: Hemoglobin 13.4; Platelets 205.0    Other studies Reviewed: Additional studies/ records that were reviewed today include: Dr Jackalyn Lombard office notes   Assessment and Plan:  1.  SVT Prior EPS with multiple atrial tachycardias Symptoms stable today ILR interrogation from 12/29 demonstrates multiple short NCT tachycardias as seen previously  2. Paroxysmal atrial fibrillation Burden very low Continue Sayvasa for CHADS2VASC of 3   Current medicines are reviewed at length with the patient today.   The patient does not have concerns regarding her medicines.  The following changes were  made today:  none  Labs/ tests ordered today include: none No orders of the defined types were placed in this encounter.    Disposition:   Follow up with me in 6 months     Signed, Chanetta Marshall, NP 09/20/2018 8:07 AM   [place] [place] [address] [phone] (office) [phone] (fax)

## 2018-09-26 ENCOUNTER — Other Ambulatory Visit: Payer: Self-pay | Admitting: Internal Medicine

## 2018-09-30 LAB — CUP PACEART REMOTE DEVICE CHECK
Implantable Pulse Generator Implant Date: 20180905
MDC IDC SESS DTM: 20191127024121

## 2018-10-01 NOTE — Progress Notes (Signed)
Subjective:   Marissa Hart is a 84 y.o. female who presents for Medicare Annual (Subsequent) preventive examination.  Review of Systems: No ROS.  Medicare Wellness Visit. Additional risk factors are reflected in the social history. Cardiac Risk Factors include: advanced age (>109men, >60 women);Other (see comment), Risk factor comments: a-fib Sleep patterns: has interrupted sleep and feels rested on waking.    Home Safety/Smoke Alarms: Feels safe in home. Smoke alarms in place.  Living environment; residence and Firearm Safety: 1-story house/ trailer, apartment, equipment: Radio producer, Type: Single Point [place] and Walkers, Type: Conservation officer, nature. Seat Belt Safety/Bike Helmet: Wears seat belt.   Female:   Pap- N/A      Mammo- N/A      Dexa scan-        CCS- N/A      Objective:     Vitals: BP 134/76 (BP Location: Right Arm, Patient Position: Sitting, Cuff Size: Normal)   Pulse 73 Comment: irregular  Ht 5' 2.5" (1.588 m)   Wt 140 lb (63.5 kg)   SpO2 96%   BMI 25.20 kg/m   Body mass index is 25.2 kg/m.  Advanced Directives 09/29/2017 07/20/2017 05/24/2017 01/24/2017 07/29/2014 04/30/2013 04/30/2013  Does Patient Have a Medical Advance Directive? Yes Yes Yes Yes Yes Patient has advance directive, copy in chart Patient has advance directive, copy not in chart  Type of Advance Directive - [place];Living will [place];Living will [place];Living will;Out of facility DNR (pink MOST or yellow form) Living will [place];Living will -  Does patient want to make changes to medical advance directive? - - No - Patient declined No - Patient declined - - -  Copy of [place] in Chart? - - No - copy requested Yes No - copy requested - -  Pre-existing out of facility DNR order (yellow form or pink MOST form) - - - Yellow form placed in chart (order not valid for inpatient use) - No -    Tobacco Social  History   Tobacco Use  Smoking Status Former Smoker  . Packs/day: 0.50  . Years: 3.00  . Pack years: 1.50  . Types: Cigarettes  . Last attempt to quit: 09/19/1966  . Years since quitting: 52.0  Smokeless Tobacco Never Used     Counseling given: Not Answered   Past Medical History:  Diagnosis Date  . Acquired renal cyst of right kidney   . Anxiety   . Atrial fibrillation and flutter ([place])    s/p PVI and CTI by Dr Rayann Heman 04/2013  . Blood transfusion   . Cataract   . CHEST WALL PAIN, HX OF 07/28/2007   Qualifier: Diagnosis of  By: Julien Girt CMA, [name]    . Diverticulosis of colon   . DJD (degenerative joint disease) 10/18/2011  . Erysipelas   . Fibromyalgia   . GERD (gastroesophageal reflux disease)   . Hiatal hernia   . Hot flashes - on low dose HRT with gynecologist, Dr. Nori Riis 03/31/2014  . Hx of colonic polyps   . Hyperlipidemia   . MITRAL VALVE PROLAPSE 07/28/2007   Qualifier: Diagnosis of  By: Julien Girt CMA, [name]    . Supraventricular tachycardia ([place])    AV nodal reentry  s/p RFCA 2001  . Thyroid disease    Past Surgical History:  Procedure Laterality Date  . ABDOMINAL HYSTERECTOMY    . ATRIAL FIBRILLATION ABLATION N/A 04/30/2013   Procedure: ATRIAL FIBRILLATION ABLATION;  Surgeon:  [name], MD;  Location: [facility] CATH LAB;  Service: Cardiovascular;  Laterality: N/A;PVI and CTI by Dr Rayann Heman   . BREAST BIOPSY Left    benign  . BREAST BIOPSY  2011   benign  . CATARACT EXTRACTION BILATERAL W/ ANTERIOR VITRECTOMY    . COLONOSCOPY    . COLONOSCOPY    . ESOPHAGOGASTRODUODENOSCOPY    . KNEE ARTHROSCOPY  2012  . KNEE SURGERY Right 08/22/2018  . LOOP RECORDER INSERTION N/A 05/24/2017   Procedure: LOOP RECORDER INSERTION;  Surgeon: [name], MD;  Location: [place] CV LAB;  Service: Cardiovascular;  Laterality: N/A;  . OOPHORECTOMY    . ROTATOR CUFF REPAIR  08/2001   Dr. Gladstone Lighter  . SVT ABLATION N/A 07/20/2017   Procedure: SVT ABLATION;  Surgeon: [name], MD;   Location: [place] CV LAB;  Service: Cardiovascular;  Laterality: N/A;  . TEE WITHOUT CARDIOVERSION N/A 04/29/2013   Procedure: TRANSESOPHAGEAL ECHOCARDIOGRAM (TEE);  Surgeon: Jettie Booze, MD;  Location: [employer] [facility];  Service: Cardiovascular;  Laterality: N/A;  . UPPER GASTROINTESTINAL ENDOSCOPY     History reviewed. No pertinent family history. Social History   Socioeconomic History  . Marital status: Widowed    Spouse name: Not on file  . Number of children: 1  . Years of education: Not on file  . Highest education level: Not on file  Occupational History    Employer: RETIRED  Social Needs  . Financial resource strain: Not hard at all  . Food insecurity:    Worry: Never true    Inability: Never true  . Transportation needs:    Medical: No    Non-medical: No  Tobacco Use  . Smoking status: Former Smoker    Packs/day: 0.50    Years: 3.00    Pack years: 1.50    Types: Cigarettes    Last attempt to quit: 09/19/1966    Years since quitting: 52.0  . Smokeless tobacco: Never Used  Substance and Sexual Activity  . Alcohol use: Yes    Alcohol/week: 3.0 standard drinks    Types: 3 Glasses of wine per week    Comment: wine weekly  . Drug use: No  . Sexual activity: Not on file  Lifestyle  . Physical activity:    Days per week: 0 days    Minutes per session: Not on file  . Stress: Only a little  Relationships  . Social connections:    Talks on phone: More than three times a week    Gets together: Three times a week    Attends religious service: More than 4 times per year    Active member of club or organization: Yes    Attends meetings of clubs or organizations: More than 4 times per year    Relationship status: Widowed  Other Topics Concern  . Not on file  Social History Narrative   Work or School: none      Home Situation: lives alone, widowed      Spiritual Beliefs: Christian      Lifestyle: cycles on stationary bike, calisthenics, active with church,  family, friends         2017/10/26   Spouse died 3 years ago at 15 and dx with liver cancer    Children 1 dtr    2 grands dtr   5 great grands; 5 and 6 and the oldest one 86    Adopted a son from [place]   Children spoke [place] and [place];  dtr was [employer] with spouse in [place] for 8 years     Outpatient Encounter Medications as of 10/02/2018  Medication Sig  . acetaminophen (TYLENOL) 500 MG tablet Take 500 mg by mouth daily as needed for moderate pain or headache.  [name] Kitchen aspirin 325 MG tablet Take 325 mg by mouth daily as needed.  . Cyanocobalamin (B-12) 5000 MCG SUBL Place 5,000 mcg under the tongue daily.  [name] Kitchen docusate sodium (COLACE) 100 MG capsule Take 100 mg by mouth daily as needed for mild constipation.  [name] Kitchen estradiol (VIVELLE-DOT) 0.05 MG/24HR Place 0.5 patches onto the skin 2 (two) times a week. Wed and Sat  . Hypromellose (ARTIFICIAL TEARS OP) Apply 1 drop to eye daily as needed (dry eyes).  [name] Kitchen levothyroxine (SYNTHROID, LEVOTHROID) 75 MCG tablet TAKE 1 TABLET BY MOUTH 4 DAYS PER WEEK  . levothyroxine (SYNTHROID, LEVOTHROID) 88 MCG tablet TAKE 88 MCG BY MOUTH 3 DAYS PER WEEK AND TAKE 75 MCG ON ALL OTHER DAYS  . psyllium (METAMUCIL) 58.6 % powder Take 1 packet by mouth daily as needed (fiber).   [name] Kitchen SAVAYSA 60 MG TABS tablet TAKE ONE TABLET BY MOUTH ONCE DAILY  . verapamil (CALAN-SR) 120 MG CR tablet Take 1 tablet (120 mg total) by mouth daily.  [name] Kitchen VITAMIN D, CHOLECALCIFEROL, PO Take 4,000 Units by mouth daily.  . [DISCONTINUED] docusate sodium (COLACE) 100 MG capsule Take 100 mg by mouth every evening.    No facility-administered encounter medications on file as of 10/02/2018.     Activities of Daily Living In your present state of health, do you have any difficulty performing the following activities: 10/02/2018  Hearing? N  Vision? N  Difficulty concentrating or making decisions? N  Walking or climbing stairs? Y  Comment RA, recent knee surgery  Dressing or bathing? N  Doing  errands, shopping? N  Preparing Food and eating ? N  Using the Toilet? N  In the past six months, have you accidently leaked urine? N  Do you have problems with loss of bowel control? N  Managing your Medications? N  Managing your Finances? N  Housekeeping or managing your Housekeeping? N  Some recent data might be hidden    Patient Care Team: Lucretia Kern, DO as PCP - General (Family Medicine) [name], MD as PCP - Cardiology (Cardiology) Maisie Fus, MD as Consulting Physician (Obstetrics and Gynecology) [name], MD as Consulting Physician (Cardiology)    Assessment:   This is a routine wellness examination for Zaylei. Physical assessment deferred to PCP.   Exercise Activities and Dietary recommendations Current Exercise Habits: Home exercise routine, Type of exercise: calisthenics;strength training/weights;stretching;walking, Time (Minutes): 30, Frequency (Times/Week): 5, Weekly Exercise (Minutes/Week): 150, Intensity: Moderate, Exercise limited by: cardiac condition(s)(reviewed breathing techniques to do while exerting self in exercise/around the house)  Diet (meal preparation, eat out, water intake, caffeinated beverages, dairy products, fruits and vegetables): in general, a "healthy" diet  , on average, 2 meals per day, with snacks.     Goals    . Patient Stated     Start walking again      . Patient Stated     Continue maintaining your exercise routine and social life!       Fall Risk Fall Risk  10/02/2018 09/29/2017 09/27/2016 09/23/2015 07/29/2014  Falls in the past year? 0 No No No No  Follow up Falls evaluation completed;Education provided - - - -    Depression Screen [facility] 2/9 Scores 10/02/2018 09/29/2017 09/27/2016 09/23/2015  PHQ - 2 Score 0 0 0 1  PHQ- 9 Score 1 - - -     Cognitive Function MMSE - Mini Mental State Exam 09/29/2017  Not completed: (No Data)        Immunization History  Administered Date(s) Administered  . H1N1 09/09/2008  .  Influenza Split 07/02/2011  . Influenza Whole 07/05/2010, 07/18/2012  . Influenza, High Dose Seasonal PF 06/09/2015, 06/09/2016, 05/30/2017, 06/05/2018  . Influenza,inj,Quad PF,6+ Mos 06/17/2013, 06/13/2014  . Pneumococcal Conjugate-13 07/29/2014  . Pneumococcal Polysaccharide-23 07/22/2002  . Tdap 11/30/2011  . Zoster 11/14/2006    Qualifies for Shingles Vaccine? Yes, discussed recommendation to receive shingrix at local pharmacy.  Screening Tests Health Maintenance  Topic Date Due  . DEXA SCAN  11/27/2019 (Originally 12/07/2001)  . TETANUS/TDAP  11/29/2021  . INFLUENZA VACCINE  Completed       Plan:    We will follow up with your DEXA and mammogram results--great job keeping up to date on all of your health maintenance!  Take it slow physically as you continue to recover from knee surgery. Continue with your low-impact exercises.   Keep relaxation/breathing techniques in mind when you have tachycardic episodes, as well as to prevent them! See handout provided.   I have personally reviewed and noted the following in the patient's chart:   . Medical and social history . Use of alcohol, tobacco or illicit drugs  . Current medications and supplements . Functional ability and status . Nutritional status . Physical activity . Advanced directives . List of other physicians . Hospitalizations, surgeries, and ER visits in previous 12 months . Vitals . Screenings to include cognitive, depression, and falls . Referrals and appointments  In addition, I have reviewed and discussed with patient certain preventive protocols, quality metrics, and best practice recommendations. A written personalized care plan for preventive services as well as general preventive health recommendations were provided to patient.     Alphia Moh, RN  10/02/2018

## 2018-10-02 ENCOUNTER — Ambulatory Visit (INDEPENDENT_AMBULATORY_CARE_PROVIDER_SITE_OTHER): Payer: Medicare Other

## 2018-10-02 ENCOUNTER — Ambulatory Visit: Payer: Medicare Other

## 2018-10-02 VITALS — BP 134/76 | HR 73 | Ht 62.5 in | Wt 140.0 lb

## 2018-10-02 DIAGNOSIS — Z0000 Encounter for general adult medical examination without abnormal findings: Secondary | ICD-10-CM | POA: Diagnosis not present

## 2018-10-02 NOTE — Progress Notes (Signed)
Hannah R Kim, DO  

## 2018-10-02 NOTE — Patient Instructions (Addendum)
We will follow up with your DEXA and mammogram results--great job keeping up to date on all of your health maintenance!  Take it slow physically as you continue to recover from knee surgery. Continue with your low-impact exercises.   Keep relaxation/breathing techniques in mind when you have tachycardic episodes, as well as to prevent them! See handout provided.   Marissa Hart , Thank you for taking time to come for your Medicare Wellness Visit. I appreciate your ongoing commitment to your health goals. Please review the following plan we discussed and let me know if I can assist you in the future.   These are the goals we discussed: Goals    . Patient Stated     Start walking again      . Patient Stated     Continue maintaining your exercise routine and social life!       This is a list of the screening recommended for you and due dates:  Health Maintenance  Topic Date Due  . DEXA scan (bone density measurement)  11/27/2019*  . Tetanus Vaccine  11/29/2021  . Flu Shot  Completed  *Topic was postponed. The date shown is not the original due date.      Health Maintenance, Female Adopting a healthy lifestyle and getting preventive care can go a long way to promote health and wellness. Talk with your health care provider about what schedule of regular examinations is right for you. This is a good chance for you to check in with your provider about disease prevention and staying healthy. In between checkups, there are plenty of things you can do on your own. Experts have done a lot of research about which lifestyle changes and preventive measures are most likely to keep you healthy. Ask your health care provider for more information. Weight and diet Eat a healthy diet  Be sure to include plenty of vegetables, fruits, low-fat dairy products, and lean protein.  Do not eat a lot of foods high in solid fats, added sugars, or salt.  Get regular exercise. This is one of the most  important things you can do for your health. ? Most adults should exercise for at least 150 minutes each week. The exercise should increase your heart rate and make you sweat (moderate-intensity exercise). ? Most adults should also do strengthening exercises at least twice a week. This is in addition to the moderate-intensity exercise. Maintain a healthy weight  Body mass index (BMI) is a measurement that can be used to identify possible weight problems. It estimates body fat based on height and weight. Your health care provider can help determine your BMI and help you achieve or maintain a healthy weight.  For females 6 years of age and older: ? A BMI below 18.5 is considered underweight. ? A BMI of 18.5 to 24.9 is normal. ? A BMI of 25 to 29.9 is considered overweight. ? A BMI of 30 and above is considered obese. Watch levels of cholesterol and blood lipids  You should start having your blood tested for lipids and cholesterol at 82 years of age, then have this test every 5 years.  You may need to have your cholesterol levels checked more often if: ? Your lipid or cholesterol levels are high. ? You are older than 82 years of age. ? You are at high risk for heart disease. Cancer screening Lung Cancer  Lung cancer screening is recommended for adults 70-47 years old who are at high risk for lung  cancer because of a history of smoking.  A yearly low-dose CT scan of the lungs is recommended for people who: ? Currently smoke. ? Have quit within the past 15 years. ? Have at least a 30-pack-year history of smoking. A pack year is smoking an average of one pack of cigarettes a day for 1 year.  Yearly screening should continue until it has been 15 years since you quit.  Yearly screening should stop if you develop a health problem that would prevent you from having lung cancer treatment. Breast Cancer  Practice breast self-awareness. This means understanding how your breasts normally appear  and feel.  It also means doing regular breast self-exams. Let your health care provider know about any changes, no matter how small.  If you are in your 20s or 30s, you should have a clinical breast exam (CBE) by a health care provider every 1-3 years as part of a regular health exam.  If you are 48 or older, have a CBE every year. Also consider having a breast X-ray (mammogram) every year.  If you have a family history of breast cancer, talk to your health care provider about genetic screening.  If you are at high risk for breast cancer, talk to your health care provider about having an MRI and a mammogram every year.  Breast cancer gene (BRCA) assessment is recommended for women who have family members with BRCA-related cancers. BRCA-related cancers include: ? Breast. ? Ovarian. ? Tubal. ? Peritoneal cancers.  Results of the assessment will determine the need for genetic counseling and BRCA1 and BRCA2 testing. Cervical Cancer Your health care provider may recommend that you be screened regularly for cancer of the pelvic organs (ovaries, uterus, and vagina). This screening involves a pelvic examination, including checking for microscopic changes to the surface of your cervix (Pap test). You may be encouraged to have this screening done every 3 years, beginning at age 25.  For women ages 10-65, health care providers may recommend pelvic exams and Pap testing every 3 years, or they may recommend the Pap and pelvic exam, combined with testing for human papilloma virus (HPV), every 5 years. Some types of HPV increase your risk of cervical cancer. Testing for HPV may also be done on women of any age with unclear Pap test results.  Other health care providers may not recommend any screening for nonpregnant women who are considered low risk for pelvic cancer and who do not have symptoms. Ask your health care provider if a screening pelvic exam is right for you.  If you have had past treatment for  cervical cancer or a condition that could lead to cancer, you need Pap tests and screening for cancer for at least 20 years after your treatment. If Pap tests have been discontinued, your risk factors (such as having a new sexual partner) need to be reassessed to determine if screening should resume. Some women have medical problems that increase the chance of getting cervical cancer. In these cases, your health care provider may recommend more frequent screening and Pap tests. Colorectal Cancer  This type of cancer can be detected and often prevented.  Routine colorectal cancer screening usually begins at 82 years of age and continues through 82 years of age.  Your health care provider may recommend screening at an earlier age if you have risk factors for colon cancer.  Your health care provider may also recommend using home test kits to check for hidden blood in the stool.  A  small camera at the end of a tube can be used to examine your colon directly (sigmoidoscopy or colonoscopy). This is done to check for the earliest forms of colorectal cancer.  Routine screening usually begins at age 88.  Direct examination of the colon should be repeated every 5-10 years through 82 years of age. However, you may need to be screened more often if early forms of precancerous polyps or small growths are found. Skin Cancer  Check your skin from head to toe regularly.  Tell your health care provider about any new moles or changes in moles, especially if there is a change in a mole's shape or color.  Also tell your health care provider if you have a mole that is larger than the size of a pencil eraser.  Always use sunscreen. Apply sunscreen liberally and repeatedly throughout the day.  Protect yourself by wearing long sleeves, pants, a wide-brimmed hat, and sunglasses whenever you are outside. Heart disease, diabetes, and high blood pressure  High blood pressure causes heart disease and increases the  risk of stroke. High blood pressure is more likely to develop in: ? People who have blood pressure in the high end of the normal range (130-139/85-89 mm Hg). ? People who are overweight or obese. ? People who are African American.  If you are 41-12 years of age, have your blood pressure checked every 3-5 years. If you are 49 years of age or older, have your blood pressure checked every year. You should have your blood pressure measured twice-once when you are at a hospital or clinic, and once when you are not at a hospital or clinic. Record the average of the two measurements. To check your blood pressure when you are not at a hospital or clinic, you can use: ? An automated blood pressure machine at a pharmacy. ? A home blood pressure monitor.  If you are between 57 years and 24 years old, ask your health care provider if you should take aspirin to prevent strokes.  Have regular diabetes screenings. This involves taking a blood sample to check your fasting blood sugar level. ? If you are at a normal weight and have a low risk for diabetes, have this test once every three years after 82 years of age. ? If you are overweight and have a high risk for diabetes, consider being tested at a younger age or more often. Preventing infection Hepatitis B  If you have a higher risk for hepatitis B, you should be screened for this virus. You are considered at high risk for hepatitis B if: ? You were born in a country where hepatitis B is common. Ask your health care provider which countries are considered high risk. ? Your parents were born in a high-risk country, and you have not been immunized against hepatitis B (hepatitis B vaccine). ? You have HIV or AIDS. ? You use needles to inject street drugs. ? You live with someone who has hepatitis B. ? You have had sex with someone who has hepatitis B. ? You get hemodialysis treatment. ? You take certain medicines for conditions, including cancer, organ  transplantation, and autoimmune conditions. Hepatitis C  Blood testing is recommended for: ? Everyone born from [birth year] through 1963. ? Anyone with known risk factors for hepatitis C. Sexually transmitted infections (STIs)  You should be screened for sexually transmitted infections (STIs) including gonorrhea and chlamydia if: ? You are sexually active and are younger than 82 years of age. ? You  are older than 82 years of age and your health care provider tells you that you are at risk for this type of infection. ? Your sexual activity has changed since you were last screened and you are at an increased risk for chlamydia or gonorrhea. Ask your health care provider if you are at risk.  If you do not have HIV, but are at risk, it may be recommended that you take a prescription medicine daily to prevent HIV infection. This is called pre-exposure prophylaxis (PrEP). You are considered at risk if: ? You are sexually active and do not regularly use condoms or know the HIV status of your partner(s). ? You take drugs by injection. ? You are sexually active with a partner who has HIV. Talk with your health care provider about whether you are at high risk of being infected with HIV. If you choose to begin PrEP, you should first be tested for HIV. You should then be tested every 3 months for as long as you are taking PrEP. Pregnancy  If you are premenopausal and you may become pregnant, ask your health care provider about preconception counseling.  If you may become pregnant, take 400 to 800 micrograms (mcg) of folic acid every day.  If you want to prevent pregnancy, talk to your health care provider about birth control (contraception). Osteoporosis and menopause  Osteoporosis is a disease in which the bones lose minerals and strength with aging. This can result in serious bone fractures. Your risk for osteoporosis can be identified using a bone density scan.  If you are 7 years of age or older, or  if you are at risk for osteoporosis and fractures, ask your health care provider if you should be screened.  Ask your health care provider whether you should take a calcium or vitamin D supplement to lower your risk for osteoporosis.  Menopause may have certain physical symptoms and risks.  Hormone replacement therapy may reduce some of these symptoms and risks. Talk to your health care provider about whether hormone replacement therapy is right for you. Follow these instructions at home:  Schedule regular health, dental, and eye exams.  Stay current with your immunizations.  Do not use any tobacco products including cigarettes, chewing tobacco, or electronic cigarettes.  If you are pregnant, do not drink alcohol.  If you are breastfeeding, limit how much and how often you drink alcohol.  Limit alcohol intake to no more than 1 drink per day for nonpregnant women. One drink equals 12 ounces of beer, 5 ounces of wine, or 1 ounces of hard liquor.  Do not use street drugs.  Do not share needles.  Ask your health care provider for help if you need support or information about quitting drugs.  Tell your health care provider if you often feel depressed.  Tell your health care provider if you have ever been abused or do not feel safe at home. This information is not intended to replace advice given to you by your health care provider. Make sure you discuss any questions you have with your health care provider. Document Released: 03/21/2011 Document Revised: 02/11/2016 Document Reviewed: 06/09/2015 Elsevier Interactive Patient Education  2019 [employer].

## 2018-10-10 ENCOUNTER — Telehealth: Payer: Self-pay

## 2018-10-10 NOTE — Telephone Encounter (Signed)
Request for DEXA scan results from 11/2017 and Mammogram results from 12/2017 [facility]. Awaiting results.

## 2018-10-17 ENCOUNTER — Other Ambulatory Visit: Payer: Self-pay | Admitting: Internal Medicine

## 2018-10-19 ENCOUNTER — Ambulatory Visit (INDEPENDENT_AMBULATORY_CARE_PROVIDER_SITE_OTHER): Payer: Medicare Other

## 2018-10-19 DIAGNOSIS — I480 Paroxysmal atrial fibrillation: Secondary | ICD-10-CM

## 2018-10-19 DIAGNOSIS — M25561 Pain in right knee: Secondary | ICD-10-CM | POA: Diagnosis not present

## 2018-10-19 DIAGNOSIS — Z5189 Encounter for other specified aftercare: Secondary | ICD-10-CM | POA: Diagnosis not present

## 2018-10-24 LAB — CUP PACEART REMOTE DEVICE CHECK
Date Time Interrogation Session: 20200201092058
Implantable Pulse Generator Implant Date: 20180905

## 2018-10-26 NOTE — Progress Notes (Signed)
Carelink Summary Report / Loop Recorder 

## 2018-11-06 ENCOUNTER — Encounter: Payer: Medicare Other | Admitting: Family Medicine

## 2018-11-19 NOTE — Progress Notes (Signed)
HPI:  Using dictation device. Unfortunately this device frequently misinterprets words/phrases.  Marissa Hart is a pleasant 84 y.o. here for follow up. Chronic medical problems summarized below were reviewed for changes and stability and were updated as needed below. These issues and their treatment remain stable for the most part.  Reports diong well for the most part. stuggles with knee pain. Seeing specialist and reports will be starting Synvisc injections. Denies CP, SOB, DOE, treatment intolerance or new symptoms. Reports she sees gyn for her physical and also sees a dermatologist.  AWV1/14/20 with Raquel Sarna  Hypothyroidism: -on levothyroxine: 76mcg 5 days, 22mcg 2 days -doing well  Hot Flashes: -on vivelle dot -sees gynecologist for this, on very low dose of transdermal estrogen, s/p hysterectomy  Dyslipidemia: -sees cardiology -she does not want to check further - refuses medications for this -doing healthy lifestyle  PAF, atrial flutter, PACs: -followed by cardiology/electrophysiology  -s/p ablation in 2014,attempted in 2018, implanted monitor in 2018 -on edoxaban(savaysa)and now on verapamil daily and doing better  Bereavment: -husband passed - grieving but doing well -anniversaries and holidays are tough -she is in a widows group at church and this has been very helpful, feels is coping well  ROS: See pertinent positives and negatives per HPI.  Past Medical History:  Diagnosis Date  . Acquired renal cyst of right kidney   . Anxiety   . Atrial fibrillation and flutter ([place])    s/p PVI and CTI by Dr Rayann Heman 04/2013  . Blood transfusion   . Cataract   . CHEST WALL PAIN, HX OF 07/28/2007   Qualifier: Diagnosis of  By: Julien Girt CMA, [name]    . Diverticulosis of colon   . DJD (degenerative joint disease) 10/18/2011  . Erysipelas   . Fibromyalgia   . GERD (gastroesophageal reflux disease)   . Hiatal hernia   . Hot flashes - on low dose HRT with  gynecologist, Dr. Nori Riis 03/31/2014  . Hx of colonic polyps   . Hyperlipidemia   . MITRAL VALVE PROLAPSE 07/28/2007   Qualifier: Diagnosis of  By: Julien Girt CMA, [name]    . Supraventricular tachycardia ([place])    AV nodal reentry  s/p RFCA 2001  . Thyroid disease     Past Surgical History:  Procedure Laterality Date  . ABDOMINAL HYSTERECTOMY    . ATRIAL FIBRILLATION ABLATION N/A 04/30/2013   Procedure: ATRIAL FIBRILLATION ABLATION;  Surgeon: [name], MD;  Location: [facility] CATH LAB;  Service: Cardiovascular;  Laterality: N/A;PVI and CTI by Dr Rayann Heman   . BREAST BIOPSY Left    benign  . BREAST BIOPSY  2011   benign  . CATARACT EXTRACTION BILATERAL W/ ANTERIOR VITRECTOMY    . COLONOSCOPY    . COLONOSCOPY    . ESOPHAGOGASTRODUODENOSCOPY    . KNEE ARTHROSCOPY  2012  . KNEE SURGERY Right 08/22/2018  . LOOP RECORDER INSERTION N/A 05/24/2017   Procedure: LOOP RECORDER INSERTION;  Surgeon: [name], MD;  Location: [facility] CV LAB;  Service: Cardiovascular;  Laterality: N/A;  . OOPHORECTOMY    . ROTATOR CUFF REPAIR  08/2001   Dr. Gladstone Lighter  . SVT ABLATION N/A 07/20/2017   Procedure: SVT ABLATION;  Surgeon: [name], MD;  Location: [place] CV LAB;  Service: Cardiovascular;  Laterality: N/A;  . TEE WITHOUT CARDIOVERSION N/A 04/29/2013   Procedure: TRANSESOPHAGEAL ECHOCARDIOGRAM (TEE);  Surgeon: Jettie Booze, MD;  Location: [facility];  Service: Cardiovascular;  Laterality: N/A;  . UPPER GASTROINTESTINAL ENDOSCOPY  History reviewed. No pertinent family history.  SOCIAL HX: see hpi   Current Outpatient Medications:  .  acetaminophen (TYLENOL) 500 MG tablet, Take 500 mg by mouth daily as needed for moderate pain or headache., Disp: , Rfl:  .  aspirin 325 MG tablet, Take 325 mg by mouth daily as needed., Disp: , Rfl:  .  Cyanocobalamin (B-12) 5000 MCG SUBL, Place 5,000 mcg under the tongue daily., Disp: , Rfl:  .  docusate sodium (COLACE) 100 MG capsule, Take 100 mg by  mouth daily as needed for mild constipation., Disp: , Rfl:  .  estradiol (VIVELLE-DOT) 0.05 MG/24HR, Place 0.5 patches onto the skin 2 (two) times a week. Wed and Sat, Disp: , Rfl:  .  Hypromellose (ARTIFICIAL TEARS OP), Apply 1 drop to eye daily as needed (dry eyes)., Disp: , Rfl:  .  levothyroxine (SYNTHROID, LEVOTHROID) 75 MCG tablet, TAKE 1 TABLET BY MOUTH 4 DAYS PER WEEK, Disp: 48 tablet, Rfl: 1 .  levothyroxine (SYNTHROID, LEVOTHROID) 88 MCG tablet, TAKE 88 MCG BY MOUTH 3 DAYS PER WEEK AND TAKE 75 MCG ON ALL OTHER DAYS, Disp: 36 tablet, Rfl: 1 .  psyllium (METAMUCIL) 58.6 % powder, Take 1 packet by mouth daily as needed (fiber). , Disp: , Rfl:  .  SAVAYSA 60 MG TABS tablet, TAKE ONE TABLET BY MOUTH ONCE DAILY, Disp: 30 tablet, Rfl: 11 .  verapamil (CALAN-SR) 120 MG CR tablet, TAKE 1 TABLET BY MOUTH ONCE DAILY, Disp: 90 tablet, Rfl: 0 .  VITAMIN D, CHOLECALCIFEROL, PO, Take 4,000 Units by mouth daily., Disp: , Rfl:   EXAM:  Vitals:   11/20/18 1014  BP: 120/80  Pulse: 73  Temp: 97.9 F (36.6 C)    Body mass index is 25.64 kg/m.  GENERAL: vitals reviewed and listed above, alert, oriented, appears well hydrated and in no acute distress  HEENT: atraumatic, conjunttiva clear, no obvious abnormalities on inspection of external nose and ears  NECK: no obvious masses on inspection  LUNGS: clear to auscultation bilaterally, no wheezes, rales or rhonchi, good air movement  CV: HRRR, no peripheral edema  MS: moves all extremities without noticeable abnormality except for antalgic gait  PSYCH: pleasant and cooperative, no obvious depression or anxiety  ASSESSMENT AND PLAN:  Discussed the following assessment and plan:  Hypothyroidism, unspecified type - Plan: TSH  Tachyarrhythmia - Plan: Basic metabolic panel, CBC  Paroxysmal atrial fibrillation (HCC)  Dyslipidemia  -labs per orders -refuses any treatment or further testing for hyperlipidemia and is aware of CV risks -sees  cardiology - -Patient advised to return or notify a doctor immediately if symptoms worsen or persist or new concerns arise.  Patient Instructions  BEFORE YOU LEAVE: -labs -follow up: 4-6 months  We have ordered labs or studies at this visit. It can take up to 1-2 weeks for results and processing. IF results require follow up or explanation, we will call you with instructions. Clinically stable results will be released to your [facility]. If you have not heard from [place] or cannot find your results in [facility] in 2 weeks please contact our office at [phone].  If you are not yet signed up for [facility], please consider signing up.           Lucretia Kern, DO

## 2018-11-20 ENCOUNTER — Encounter: Payer: Self-pay | Admitting: Family Medicine

## 2018-11-20 ENCOUNTER — Ambulatory Visit (INDEPENDENT_AMBULATORY_CARE_PROVIDER_SITE_OTHER): Payer: Medicare Other | Admitting: Family Medicine

## 2018-11-20 VITALS — BP 120/80 | HR 73 | Temp 97.9°F | Ht 62.0 in | Wt 140.2 lb

## 2018-11-20 DIAGNOSIS — E039 Hypothyroidism, unspecified: Secondary | ICD-10-CM

## 2018-11-20 DIAGNOSIS — R000 Tachycardia, unspecified: Secondary | ICD-10-CM

## 2018-11-20 DIAGNOSIS — I480 Paroxysmal atrial fibrillation: Secondary | ICD-10-CM

## 2018-11-20 DIAGNOSIS — E785 Hyperlipidemia, unspecified: Secondary | ICD-10-CM

## 2018-11-20 LAB — CBC
HCT: 40.5 % (ref 36.0–46.0)
Hemoglobin: 13.6 g/dL (ref 12.0–15.0)
MCHC: 33.5 g/dL (ref 30.0–36.0)
MCV: 94.8 fl (ref 78.0–100.0)
Platelets: 225 10*3/uL (ref 150.0–400.0)
RBC: 4.27 Mil/uL (ref 3.87–5.11)
RDW: 13.7 % (ref 11.5–15.5)
WBC: 7.2 10*3/uL (ref 4.0–10.5)

## 2018-11-20 LAB — TSH: TSH: 2.34 u[IU]/mL (ref 0.35–4.50)

## 2018-11-20 LAB — BASIC METABOLIC PANEL
BUN: 15 mg/dL (ref 6–23)
CO2: 29 mEq/L (ref 19–32)
Calcium: 9.1 mg/dL (ref 8.4–10.5)
Chloride: 100 mEq/L (ref 96–112)
Creatinine, Ser: 0.69 mg/dL (ref 0.40–1.20)
GFR: 81.46 mL/min (ref >60.00)
GLUCOSE: 86 mg/dL (ref 70–99)
Potassium: 4.8 mEq/L (ref 3.5–5.1)
Sodium: 137 mEq/L (ref 135–145)

## 2018-11-20 NOTE — Patient Instructions (Signed)
BEFORE YOU LEAVE: -labs -follow up: 4-6 months  We have ordered labs or studies at this visit. It can take up to 1-2 weeks for results and processing. IF results require follow up or explanation, we will call you with instructions. Clinically stable results will be released to your [facility]. If you have not heard from [place] or cannot find your results in [facility] in 2 weeks please contact our office at [phone].  If you are not yet signed up for [facility], please consider signing up.

## 2018-11-21 ENCOUNTER — Ambulatory Visit (INDEPENDENT_AMBULATORY_CARE_PROVIDER_SITE_OTHER): Payer: Medicare Other | Admitting: *Deleted

## 2018-11-21 DIAGNOSIS — I480 Paroxysmal atrial fibrillation: Secondary | ICD-10-CM | POA: Diagnosis not present

## 2018-11-22 ENCOUNTER — Encounter: Payer: Self-pay | Admitting: Family Medicine

## 2018-11-22 LAB — CUP PACEART REMOTE DEVICE CHECK
Date Time Interrogation Session: 20200305104155
Implantable Pulse Generator Implant Date: 20180905

## 2018-11-28 DIAGNOSIS — Z961 Presence of intraocular lens: Secondary | ICD-10-CM | POA: Diagnosis not present

## 2018-11-28 DIAGNOSIS — H02834 Dermatochalasis of left upper eyelid: Secondary | ICD-10-CM | POA: Diagnosis not present

## 2018-11-28 DIAGNOSIS — H02831 Dermatochalasis of right upper eyelid: Secondary | ICD-10-CM | POA: Diagnosis not present

## 2018-11-28 DIAGNOSIS — H04123 Dry eye syndrome of bilateral lacrimal glands: Secondary | ICD-10-CM | POA: Diagnosis not present

## 2018-11-29 NOTE — Progress Notes (Signed)
Carelink Summary Report / Loop Recorder 

## 2018-12-03 DIAGNOSIS — M1711 Unilateral primary osteoarthritis, right knee: Secondary | ICD-10-CM | POA: Diagnosis not present

## 2018-12-03 DIAGNOSIS — M25561 Pain in right knee: Secondary | ICD-10-CM | POA: Diagnosis not present

## 2018-12-04 ENCOUNTER — Other Ambulatory Visit: Payer: Self-pay | Admitting: Internal Medicine

## 2018-12-05 DIAGNOSIS — L718 Other rosacea: Secondary | ICD-10-CM | POA: Diagnosis not present

## 2018-12-10 DIAGNOSIS — M1711 Unilateral primary osteoarthritis, right knee: Secondary | ICD-10-CM | POA: Diagnosis not present

## 2018-12-17 DIAGNOSIS — M1711 Unilateral primary osteoarthritis, right knee: Secondary | ICD-10-CM | POA: Diagnosis not present

## 2018-12-25 ENCOUNTER — Ambulatory Visit (INDEPENDENT_AMBULATORY_CARE_PROVIDER_SITE_OTHER): Payer: Medicare Other | Admitting: *Deleted

## 2018-12-25 ENCOUNTER — Other Ambulatory Visit: Payer: Self-pay

## 2018-12-25 DIAGNOSIS — I480 Paroxysmal atrial fibrillation: Secondary | ICD-10-CM | POA: Diagnosis not present

## 2018-12-25 LAB — CUP PACEART REMOTE DEVICE CHECK
Date Time Interrogation Session: 20200407114102
Implantable Pulse Generator Implant Date: 20180905

## 2019-01-03 NOTE — Progress Notes (Signed)
Carelink Summary Report / Loop Recorder 

## 2019-01-16 ENCOUNTER — Other Ambulatory Visit: Payer: Self-pay | Admitting: Family Medicine

## 2019-01-16 ENCOUNTER — Other Ambulatory Visit: Payer: Self-pay | Admitting: Internal Medicine

## 2019-01-28 ENCOUNTER — Other Ambulatory Visit: Payer: Self-pay

## 2019-01-28 ENCOUNTER — Ambulatory Visit (INDEPENDENT_AMBULATORY_CARE_PROVIDER_SITE_OTHER): Payer: Medicare Other | Admitting: *Deleted

## 2019-01-28 DIAGNOSIS — I480 Paroxysmal atrial fibrillation: Secondary | ICD-10-CM | POA: Diagnosis not present

## 2019-01-28 DIAGNOSIS — R002 Palpitations: Secondary | ICD-10-CM

## 2019-01-28 DIAGNOSIS — M1711 Unilateral primary osteoarthritis, right knee: Secondary | ICD-10-CM | POA: Diagnosis not present

## 2019-01-28 LAB — CUP PACEART REMOTE DEVICE CHECK
Date Time Interrogation Session: 20200510124101
Implantable Pulse Generator Implant Date: 20180905

## 2019-02-07 NOTE — Progress Notes (Signed)
Carelink Summary Report / Loop Recorder 

## 2019-03-01 ENCOUNTER — Ambulatory Visit (INDEPENDENT_AMBULATORY_CARE_PROVIDER_SITE_OTHER): Payer: Medicare Other | Admitting: *Deleted

## 2019-03-01 DIAGNOSIS — I480 Paroxysmal atrial fibrillation: Secondary | ICD-10-CM | POA: Diagnosis not present

## 2019-03-01 LAB — CUP PACEART REMOTE DEVICE CHECK
Date Time Interrogation Session: 20200612133820
Implantable Pulse Generator Implant Date: 20180905

## 2019-03-04 NOTE — Progress Notes (Signed)
Carelink Summary Report / Loop Recorder 

## 2019-03-18 DIAGNOSIS — Z1231 Encounter for screening mammogram for malignant neoplasm of breast: Secondary | ICD-10-CM | POA: Diagnosis not present

## 2019-03-18 LAB — HM MAMMOGRAPHY

## 2019-04-03 ENCOUNTER — Ambulatory Visit (INDEPENDENT_AMBULATORY_CARE_PROVIDER_SITE_OTHER): Payer: Medicare Other | Admitting: *Deleted

## 2019-04-03 DIAGNOSIS — I480 Paroxysmal atrial fibrillation: Secondary | ICD-10-CM | POA: Diagnosis not present

## 2019-04-03 LAB — CUP PACEART REMOTE DEVICE CHECK
Date Time Interrogation Session: 20200715144216
Implantable Pulse Generator Implant Date: 20180905

## 2019-04-12 ENCOUNTER — Other Ambulatory Visit: Payer: Self-pay | Admitting: Internal Medicine

## 2019-04-13 NOTE — Progress Notes (Signed)
Carelink Summary Report / Loop Recorder 

## 2019-04-25 ENCOUNTER — Telehealth: Payer: Self-pay

## 2019-04-25 NOTE — Telephone Encounter (Signed)
Spoke with pt regarding appt on 04/29/19. Pt stated she will get her daughter to help her get ready for her visit. Pt was advise to call me back if she has any questions.

## 2019-04-29 ENCOUNTER — Encounter: Payer: Self-pay | Admitting: Internal Medicine

## 2019-04-29 ENCOUNTER — Telehealth (INDEPENDENT_AMBULATORY_CARE_PROVIDER_SITE_OTHER): Payer: Medicare Other | Admitting: Internal Medicine

## 2019-04-29 VITALS — BP 128/62 | HR 75 | Ht 62.0 in | Wt 141.0 lb

## 2019-04-29 DIAGNOSIS — I471 Supraventricular tachycardia: Secondary | ICD-10-CM | POA: Diagnosis not present

## 2019-04-29 DIAGNOSIS — Z9889 Other specified postprocedural states: Secondary | ICD-10-CM

## 2019-04-29 DIAGNOSIS — I480 Paroxysmal atrial fibrillation: Secondary | ICD-10-CM | POA: Diagnosis not present

## 2019-04-29 DIAGNOSIS — R002 Palpitations: Secondary | ICD-10-CM

## 2019-04-29 NOTE — Progress Notes (Signed)
Electrophysiology TeleHealth Note   Due to national recommendations of social distancing due to COVID 19, an audio/video telehealth visit is felt to be most appropriate for this patient at this time.  See MyChart message from today for the patient's consent to telehealth for [facility].   Date:  04/29/2019   ID:  Marissa Hart, DOB 06-Aug-1935, MRN [id number]  Location: patient's home  Provider location:  [facility]  Evaluation Performed: Follow-up visit  PCP:  Lucretia Kern, DO   Electrophysiologist:  Dr Rayann Heman  Chief Complaint:  palpitations  History of Present Illness:    Marissa Hart is a 84 y.o. female who presents via telehealth conferencing today.  Since last being seen in our clinic, the patient reports doing very well.  Today, she denies symptoms of palpitations, chest pain, shortness of breath,  lower extremity edema, dizziness, presyncope, or syncope.  Her primary concern is with knee pain.  She is considering knee surgery.  The patient is otherwise without complaint today.  The patient denies symptoms of fevers, chills, cough, or new SOB worrisome for COVID 19.  Past Medical History:  Diagnosis Date  . Acquired renal cyst of right kidney   . Anxiety   . Atrial fibrillation and flutter ([place])    s/p PVI and CTI by Dr Rayann Heman 04/2013  . Blood transfusion   . Cataract   . CHEST WALL PAIN, HX OF 07/28/2007   Qualifier: Diagnosis of  By: Julien Girt CMA, [name]    . Diverticulosis of colon   . DJD (degenerative joint disease) 10/18/2011  . Erysipelas   . Fibromyalgia   . GERD (gastroesophageal reflux disease)   . Hiatal hernia   . Hot flashes - on low dose HRT with gynecologist, Dr. Nori Riis 03/31/2014  . Hx of colonic polyps   . Hyperlipidemia   . MITRAL VALVE PROLAPSE 07/28/2007   Qualifier: Diagnosis of  By: Julien Girt CMA, [name]    . Supraventricular tachycardia ([place])    AV nodal reentry  s/p RFCA 2001  . Thyroid disease     Past Surgical History:  Procedure  Laterality Date  . ABDOMINAL HYSTERECTOMY    . ATRIAL FIBRILLATION ABLATION N/A 04/30/2013   Procedure: ATRIAL FIBRILLATION ABLATION;  Surgeon: [name], MD;  Location: [facility] CATH LAB;  Service: Cardiovascular;  Laterality: N/A;PVI and CTI by Dr Rayann Heman   . BREAST BIOPSY Left    benign  . BREAST BIOPSY  2011   benign  . CATARACT EXTRACTION BILATERAL W/ ANTERIOR VITRECTOMY    . COLONOSCOPY    . COLONOSCOPY    . ESOPHAGOGASTRODUODENOSCOPY    . KNEE ARTHROSCOPY  2012  . KNEE SURGERY Right 08/22/2018  . LOOP RECORDER INSERTION N/A 05/24/2017   Procedure: LOOP RECORDER INSERTION;  Surgeon: [name], MD;  Location: [place] CV LAB;  Service: Cardiovascular;  Laterality: N/A;  . OOPHORECTOMY    . ROTATOR CUFF REPAIR  08/2001   Dr. Gladstone Lighter  . SVT ABLATION N/A 07/20/2017   Procedure: SVT ABLATION;  Surgeon: [name], MD;  Location: [place] CV LAB;  Service: Cardiovascular;  Laterality: N/A;  . TEE WITHOUT CARDIOVERSION N/A 04/29/2013   Procedure: TRANSESOPHAGEAL ECHOCARDIOGRAM (TEE);  Surgeon: Jettie Booze, MD;  Location: [place];  Service: Cardiovascular;  Laterality: N/A;  . UPPER GASTROINTESTINAL ENDOSCOPY      Current Outpatient Medications  Medication Sig Dispense Refill  . acetaminophen (TYLENOL) 500 MG tablet Take 500 mg by mouth daily as needed for  moderate pain or headache.    [name] Kitchen aspirin 325 MG tablet Take 325 mg by mouth daily as needed.    . Cyanocobalamin (B-12) 5000 MCG SUBL Place 5,000 mcg under the tongue daily.    [name] Kitchen docusate sodium (COLACE) 100 MG capsule Take 100 mg by mouth daily as needed for mild constipation.    [name] Kitchen estradiol (VIVELLE-DOT) 0.05 MG/24HR Place 0.5 patches onto the skin 2 (two) times a week. Wed and Sat    . Hypromellose (ARTIFICIAL TEARS OP) Apply 1 drop to eye daily as needed (dry eyes).    [name] Kitchen levothyroxine (SYNTHROID) 75 MCG tablet TAKE 1 TABLET BY MOUTH 4 DAYS PER WEEK 48 tablet 1  . levothyroxine (SYNTHROID) 88 MCG tablet TAKE 88  MCG BY MOUTH 3 DAYS PER WEEK AND TAKE 75 MCG ON ALL OTHER DAYS 36 tablet 1  . psyllium (METAMUCIL) 58.6 % powder Take 1 packet by mouth daily as needed (fiber).     [name] Kitchen SAVAYSA 60 MG TABS tablet TAKE ONE TABLET BY MOUTH ONCE DAILY 30 tablet 11  . verapamil (CALAN-SR) 120 MG CR tablet Take 1 tablet by mouth once daily 90 tablet 1  . VITAMIN D, CHOLECALCIFEROL, PO Take 4,000 Units by mouth daily.     No current facility-administered medications for this visit.     Allergies:   Propafenone, Codeine, and Tramadol hcl   Social History:  The patient  reports that she quit smoking about 52 years ago. Her smoking use included cigarettes. She has a 1.50 pack-year smoking history. She has never used smokeless tobacco. She reports current alcohol use of about 3.0 standard drinks of alcohol per week. She reports that she does not use drugs.   Family History:  + HTN  ROS:  Please see the history of present illness.   All other systems are personally reviewed and negative.    Exam:    Vital Signs:  BP 128/62   Pulse 75   Ht 5\' 2"  (1.575 m)   Wt 141 lb (64 kg)   SpO2 97%   BMI 25.79 kg/m   Well sounding and appearing, alert and conversant, regular work of breathing,  good skin color Eyes- anicteric, neuro- grossly intact, skin- no apparent rash or lesions or cyanosis, mouth- oral mucosa is pink  Labs/Other Tests and Data Reviewed:    Recent Labs: 11/20/2018: BUN 15; Creatinine, Ser 0.69; Hemoglobin 13.6; Platelets 225.0; Potassium 4.8; Sodium 137; TSH 2.34   Wt Readings from Last 3 Encounters:  04/29/19 141 lb (64 kg)  11/20/18 140 lb 3.2 oz (63.6 kg)  10/02/18 140 lb (63.5 kg)     Last device remote is reviewed from [place] PDF which reveals normal device function, very low arrhythmia burden   ASSESSMENT & PLAN:    1.  SVT Prior EPS revealed multiple atach circuits not amenable to ablation Doing well ILR reveals stable rhythm  2. Paroxysmal atrial fibrillation Low AF burden (<1%)  chads2vasc score is 3. On savaysa  3. preop Ok to proceed with knee surgery if medically indicated without further CV testing Ok to hold savaysa 48 hours prior to the procedure  Follow-up:  EP NP in 12 months   Patient Risk:  after full review of this patients clinical status, I feel that they are at moderate risk at this time.  Today, I have spent 15 minutes with the patient with telehealth technology discussing arrhythmia management .    Signed, [name], MD  04/29/2019 11:16 AM  [place] [address] [phone] (office) [phone] (fax)

## 2019-05-02 ENCOUNTER — Encounter: Payer: Self-pay | Admitting: Family Medicine

## 2019-05-02 DIAGNOSIS — M25561 Pain in right knee: Secondary | ICD-10-CM | POA: Diagnosis not present

## 2019-05-03 ENCOUNTER — Telehealth: Payer: Self-pay | Admitting: *Deleted

## 2019-05-03 NOTE — Telephone Encounter (Signed)
    [facility] Pre-operative Risk Assessment    Request for surgical clearance:  1. What type of surgery is being performed? RIGHT TOTAL KNEE ARTHROPLASTY   2. When is this surgery scheduled? 06/10/19   3. What type of clearance is required (medical clearance vs. Pharmacy clearance to hold med vs. Both)? MEDICAL  4. Are there any medications that need to be held prior to surgery and how long? ASA AND SAVAYSA   5. Practice name and name of physician performing surgery? [facility]; DR. Wynelle Link   6. What is your office phone number [phone]    7.   What is your office fax number [phone]  8.   Anesthesia type (None, local, MAC, general) ? CHOICE    Marissa Hart 05/03/2019, 1:36 PM  _________________________________________________________________   (provider comments below)

## 2019-05-03 NOTE — Telephone Encounter (Signed)
   Primary Cardiologist: [name], MD  Chart reviewed as part of pre-operative protocol coverage. Pt was just interviewed by Dr Rayann Heman 04/29/2019 and was cleared for surgery. Given past medical history and time since last visit, based on ACC/AHA guidelines, Marissa Hart would be at acceptable risk for the planned procedure without further cardiovascular testing.   OK to hold aspirin and Savaysa 3 days pre op.  I will route this recommendation to the requesting party via Epic fax function and remove from pre-op pool.  Please call with questions.  Kerin Ransom, PA-C 05/03/2019, 2:53 PM

## 2019-05-03 NOTE — Telephone Encounter (Signed)
Patient with diagnosis of afib on Savaysa for anticoagulation.    Procedure: RIGHT TOTAL KNEE ARTHROPLASTY  Date of procedure: 06/10/2019  CHADS2-VASc score of  3 ( AGE, AGE, female)  CrCl 43ml/min  Per office protocol, patient can hold Savaysa for 3 days prior to procedure.

## 2019-05-06 ENCOUNTER — Ambulatory Visit (INDEPENDENT_AMBULATORY_CARE_PROVIDER_SITE_OTHER): Payer: Medicare Other | Admitting: *Deleted

## 2019-05-06 DIAGNOSIS — I480 Paroxysmal atrial fibrillation: Secondary | ICD-10-CM | POA: Diagnosis not present

## 2019-05-06 LAB — CUP PACEART REMOTE DEVICE CHECK
Date Time Interrogation Session: 20200817135228
Implantable Pulse Generator Implant Date: 20180905

## 2019-05-15 NOTE — Progress Notes (Signed)
Carelink Summary Report / Loop Recorder 

## 2019-05-16 ENCOUNTER — Encounter: Payer: Self-pay | Admitting: Family Medicine

## 2019-05-22 ENCOUNTER — Other Ambulatory Visit: Payer: Self-pay

## 2019-05-22 ENCOUNTER — Encounter: Payer: Self-pay | Admitting: Family Medicine

## 2019-05-22 ENCOUNTER — Ambulatory Visit (INDEPENDENT_AMBULATORY_CARE_PROVIDER_SITE_OTHER): Payer: Medicare Other | Admitting: Family Medicine

## 2019-05-22 VITALS — BP 110/60 | HR 74 | Temp 98.8°F | Ht 62.0 in | Wt 145.7 lb

## 2019-05-22 DIAGNOSIS — Z23 Encounter for immunization: Secondary | ICD-10-CM

## 2019-05-22 DIAGNOSIS — E039 Hypothyroidism, unspecified: Secondary | ICD-10-CM

## 2019-05-22 DIAGNOSIS — I480 Paroxysmal atrial fibrillation: Secondary | ICD-10-CM

## 2019-05-22 LAB — TSH: TSH: 0.62 u[IU]/mL (ref 0.35–4.50)

## 2019-05-22 NOTE — Progress Notes (Signed)
Marissa Hart DOB: 08-Nov-1934 Encounter date: 05/22/2019  This is a 84 y.o. female who presents to establish care. Chief Complaint  Patient presents with  . Establish Care    History of present illness: No specific concerns today. Getting right knee replacement on 21st. Hasn't been as active due to this. Has had scope, gel injections but didn't get much relief. Seeing Dr. Wynelle Link.   Atrial fib: follows with cardiology. On savaysa. EPS revealed multiple a tach circuits; not ablation candidate. Stable rhythm. Low AF burden per Dr. Rayann Heman note 04/29/19.  Hypothyroid: thyroid dosing has fluctuated somewhat. Was on 70mcg dose and not eating well; then needed dose reduction. Does best with TSH between 1 and 2.   Fibromyalgia: states that this was very long ago. Was seeing Dr. Lenna Gilford. She states that it was not really discussed.   Dyslipidemia: doesn't want further checking of levels per [facility] last note. Does not want to take medications for this  follows with gyn Dr. Nori Riis for gyn needs: on low dose transdermal estrogen for hot flashes. Has cut down dose of this and quarters her dose. Doesn't sleep as well and doesn't feel as well without this.   Follows with derm  Last AWV 10/01/18  Past Medical History:  Diagnosis Date  . Acquired renal cyst of right kidney   . Anxiety   . Atrial fibrillation and flutter ([place])    s/p PVI and CTI by Dr Rayann Heman 04/2013  . Blood transfusion   . Cataract   . CHEST WALL PAIN, HX OF 07/28/2007   Qualifier: Diagnosis of  By: Julien Girt CMA, [name]    . Diverticulosis of colon   . DJD (degenerative joint disease) 10/18/2011  . Erysipelas   . Fibromyalgia   . FIBROMYALGIA 07/28/2007   Qualifier: Diagnosis of  By: Julien Girt CMA, [name]    . GERD (gastroesophageal reflux disease)   . Hiatal hernia   . Hot flashes - on low dose HRT with gynecologist, Dr. Nori Riis 03/31/2014  . Hx of colonic polyps   . Hyperlipidemia   . MITRAL VALVE PROLAPSE 07/28/2007   Qualifier: Diagnosis  of  By: Julien Girt CMA, [name]    . Supraventricular tachycardia ([place])    AV nodal reentry  s/p RFCA 2001  . Thyroid disease    Past Surgical History:  Procedure Laterality Date  . ABDOMINAL HYSTERECTOMY     heavy menses, prolonged periods. no cancer. cervix remained.   . ATRIAL FIBRILLATION ABLATION N/A 04/30/2013   Procedure: ATRIAL FIBRILLATION ABLATION;  Surgeon: [name], MD;  Location: [facility] CATH LAB;  Service: Cardiovascular;  Laterality: N/A;PVI and CTI by Dr Rayann Heman   . BREAST BIOPSY Left    benign  . BREAST BIOPSY  2011   benign  . CATARACT EXTRACTION BILATERAL W/ ANTERIOR VITRECTOMY    . COLONOSCOPY    . ESOPHAGOGASTRODUODENOSCOPY    . KNEE ARTHROSCOPY Right 2012  . KNEE SURGERY Right 08/22/2018  . LOOP RECORDER INSERTION N/A 05/24/2017   Procedure: LOOP RECORDER INSERTION;  Surgeon: [name], MD;  Location: [place] CV LAB;  Service: Cardiovascular;  Laterality: N/A;  . OOPHORECTOMY     done for cyst on ovaries.   [name] Kitchen ROTATOR CUFF REPAIR  08/2001   Dr. Gladstone Lighter  . SVT ABLATION N/A 07/20/2017   Procedure: SVT ABLATION;  Surgeon: [name], MD;  Location: [place] CV LAB;  Service: Cardiovascular;  Laterality: N/A;  . TEE WITHOUT CARDIOVERSION N/A 04/29/2013   Procedure: TRANSESOPHAGEAL ECHOCARDIOGRAM (TEE);  Surgeon:  Jettie Booze, MD;  Location: [facility] ENDOSCOPY;  Service: Cardiovascular;  Laterality: N/A;  . UPPER GASTROINTESTINAL ENDOSCOPY     Allergies  Allergen Reactions  . Propafenone Other (See Comments)    BP too low  . Codeine Other (See Comments)    REACTION: nausea/dizziness  . Tramadol Hcl Other (See Comments)    REACTION: nausea--dizzy   Current Meds  Medication Sig  . acetaminophen (TYLENOL) 500 MG tablet Take 500 mg by mouth daily as needed for moderate pain or headache.  [name] Kitchen aspirin 325 MG tablet Take 325 mg by mouth daily as needed.  . Cyanocobalamin (B-12) 5000 MCG SUBL Place 5,000 mcg under the tongue daily.  [name] Kitchen docusate sodium (COLACE)  100 MG capsule Take 100 mg by mouth daily as needed for mild constipation.  [name] Kitchen estradiol (VIVELLE-DOT) 0.05 MG/24HR Place 0.5 patches onto the skin 2 (two) times a week. Wed and Sat  . Hypromellose (ARTIFICIAL TEARS OP) Apply 1 drop to eye daily as needed (dry eyes).  [name] Kitchen levothyroxine (SYNTHROID) 75 MCG tablet TAKE 1 TABLET BY MOUTH 4 DAYS PER WEEK  . levothyroxine (SYNTHROID) 88 MCG tablet TAKE 88 MCG BY MOUTH 3 DAYS PER WEEK AND TAKE 75 MCG ON ALL OTHER DAYS  . psyllium (METAMUCIL) 58.6 % powder Take 1 packet by mouth daily as needed (fiber).   [name] Kitchen SAVAYSA 60 MG TABS tablet TAKE ONE TABLET BY MOUTH ONCE DAILY  . verapamil (CALAN-SR) 120 MG CR tablet Take 1 tablet by mouth once daily  . VITAMIN D, CHOLECALCIFEROL, PO Take 4,000 Units by mouth daily.   Social History   Tobacco Use  . Smoking status: Former Smoker    Packs/day: 0.50    Years: 3.00    Pack years: 1.50    Types: Cigarettes    Quit date: 09/19/1966    Years since quitting: 52.7  . Smokeless tobacco: Never Used  Substance Use Topics  . Alcohol use: Yes    Alcohol/week: 3.0 standard drinks    Types: 3 Glasses of wine per week    Comment: wine weekly   Family History  Problem Relation Age of Onset  . Heart failure Mother        died suddenly with this  . Parkinson's disease Father   . Diabetes Brother   . Healthy Daughter      Review of Systems  Constitutional: Negative for chills, fatigue and fever.  Respiratory: Negative for cough, chest tightness, shortness of breath and wheezing.   Cardiovascular: Negative for chest pain, palpitations and leg swelling.    Objective:  BP 110/60 (BP Location: Left Arm, Patient Position: Sitting, Cuff Size: Normal)   Pulse 74   Temp 98.8 F (37.1 C) (Oral)   Ht 5\' 2"  (1.575 m)   Wt 145 lb 11.2 oz (66.1 kg)   SpO2 97%   BMI 26.65 kg/m   Weight: 145 lb 11.2 oz (66.1 kg)   BP Readings from Last 3 Encounters:  05/22/19 110/60  04/29/19 128/62  11/20/18 120/80   Wt Readings  from Last 3 Encounters:  05/22/19 145 lb 11.2 oz (66.1 kg)  04/29/19 141 lb (64 kg)  11/20/18 140 lb 3.2 oz (63.6 kg)    Physical Exam Constitutional:      General: She is not in acute distress.    Appearance: She is well-developed.  Cardiovascular:     Rate and Rhythm: Normal rate and regular rhythm.     Heart sounds: Normal heart sounds. No murmur. No friction  rub.  Pulmonary:     Effort: Pulmonary effort is normal. No respiratory distress.     Breath sounds: Normal breath sounds. No wheezing or rales.  Musculoskeletal:     Right lower leg: No edema.     Left lower leg: No edema.  Neurological:     Mental Status: She is alert and oriented to person, place, and time.  Psychiatric:        Behavior: Behavior normal.     Assessment/Plan:  1. Hypothyroidism, unspecified type Will check lab today in office. Med changes pending these results if needed. Has AWV scheduled with [facility] already. Has next in office physical in March scheduled. - TSH; Future - TSH  2. Paroxysmal atrial fibrillation (HCC) Rate controlled. Following with cardiology. On savaysa.   3. Need for immunization against influenza - Flu Vaccine QUAD High Dose(Fluad)  Return for AWV with [facility] virtual in Jan; physical in 6 months in office.  Micheline Rough, MD

## 2019-05-22 NOTE — Patient Instructions (Signed)
If hospital is not able to add on thyroid testing to bloodwork, please complete before you are due for a refill of thyroid medication.

## 2019-05-23 MED ORDER — LEVOTHYROXINE SODIUM 88 MCG PO TABS: ORAL_TABLET | ORAL | 1 refills | Status: DC

## 2019-05-23 NOTE — Addendum Note (Signed)
Addended by: Agnes Lawrence on: 05/23/2019 10:12 AM   Modules accepted: Orders

## 2019-05-31 ENCOUNTER — Other Ambulatory Visit: Payer: Self-pay | Admitting: Internal Medicine

## 2019-05-31 NOTE — Telephone Encounter (Signed)
Pt c/o medication issue:  1. Name of Medication: SAVAYSA 60 mg  2. How are you currently taking this medication (dosage and times per day)? 1 daily  3. Are you having a reaction (difficulty breathing--STAT)? no  4. What is your medication issue? 30 per pt call not stat making sure she gets it on time.

## 2019-06-03 NOTE — H&P (Signed)
TOTAL KNEE ADMISSION H&P  Patient is being admitted for right total knee arthroplasty.  Subjective:  Chief Complaint:right knee pain.  HPI: Marissa Hart, 84 y.o. female, has a history of pain and functional disability in the right knee due to arthritis and has failed non-surgical conservative treatments for greater than 12 weeks to includeNSAID's and/or analgesics, corticosteriod injections, flexibility and strengthening excercises, use of assistive devices and activity modification.  Onset of symptoms was gradual, starting 3 years ago with gradually worsening course since that time. The patient noted prior procedures on the knee to include  arthroscopy and menisectomy on the right knee(s).  Patient currently rates pain in the right knee(s) at 8 out of 10 with activity. Patient has night pain, worsening of pain with activity and weight bearing, pain that interferes with activities of daily living, pain with passive range of motion, crepitus and joint swelling.  Patient has evidence of periarticular osteophytes and joint space narrowing by imaging studies.  There is no active infection.  Patient Active Problem List   Diagnosis Date Noted  . Tachyarrhythmia 02/14/2018  . Left hip pain 03/28/2016  . Atrial fibrillation ([place]) 03/31/2014  . Hot flashes - on low dose HRT with gynecologist, Dr. Nori Riis 03/31/2014  . Hypothyroidism 07/28/2007   Past Medical History:  Diagnosis Date  . Acquired renal cyst of right kidney   . Anxiety   . Atrial fibrillation and flutter ([place])    s/p PVI and CTI by Dr Rayann Heman 04/2013  . Blood transfusion   . Cataract   . CHEST WALL PAIN, HX OF 07/28/2007   Qualifier: Diagnosis of  By: Julien Girt CMA, [name]    . Diverticulosis of colon   . DJD (degenerative joint disease) 10/18/2011  . Erysipelas   . Fibromyalgia   . FIBROMYALGIA 07/28/2007   Qualifier: Diagnosis of  By: Julien Girt CMA, [name]    . GERD (gastroesophageal reflux disease)   . Hiatal hernia   . Hot flashes -  on low dose HRT with gynecologist, Dr. Nori Riis 03/31/2014  . Hx of colonic polyps   . Hyperlipidemia   . MITRAL VALVE PROLAPSE 07/28/2007   Qualifier: Diagnosis of  By: Julien Girt CMA, [name]    . Supraventricular tachycardia ([place])    AV nodal reentry  s/p RFCA 2001  . Thyroid disease     Past Surgical History:  Procedure Laterality Date  . ABDOMINAL HYSTERECTOMY     heavy menses, prolonged periods. no cancer. cervix remained.   . ATRIAL FIBRILLATION ABLATION N/A 04/30/2013   Procedure: ATRIAL FIBRILLATION ABLATION;  Surgeon: [name], MD;  Location: [facility] CATH LAB;  Service: Cardiovascular;  Laterality: N/A;PVI and CTI by Dr Rayann Heman   . BREAST BIOPSY Left    benign  . BREAST BIOPSY  2011   benign  . CATARACT EXTRACTION BILATERAL W/ ANTERIOR VITRECTOMY    . COLONOSCOPY    . ESOPHAGOGASTRODUODENOSCOPY    . KNEE ARTHROSCOPY Right 2012  . KNEE SURGERY Right 08/22/2018  . LOOP RECORDER INSERTION N/A 05/24/2017   Procedure: LOOP RECORDER INSERTION;  Surgeon: [name], MD;  Location: [place] CV LAB;  Service: Cardiovascular;  Laterality: N/A;  . OOPHORECTOMY     done for cyst on ovaries.   [name] Kitchen ROTATOR CUFF REPAIR  08/2001   Dr. Gladstone Lighter  . SVT ABLATION N/A 07/20/2017   Procedure: SVT ABLATION;  Surgeon: [name], MD;  Location: [place] CV LAB;  Service: Cardiovascular;  Laterality: N/A;  . TEE WITHOUT CARDIOVERSION N/A 04/29/2013  Procedure: TRANSESOPHAGEAL ECHOCARDIOGRAM (TEE);  Surgeon: Jettie Booze, MD;  Location: [place];  Service: Cardiovascular;  Laterality: N/A;  . UPPER GASTROINTESTINAL ENDOSCOPY         Current Outpatient Medications  Medication Sig Dispense Refill Last Dose  . acetaminophen (TYLENOL) 500 MG tablet Take 500 mg by mouth daily as needed for moderate pain or headache.     [name] Kitchen aspirin 325 MG tablet Take 325 mg by mouth daily as needed (knee piain).      . Cyanocobalamin (B-12) 5000 MCG SUBL Place 5,000 mcg under the tongue daily.     [name] Kitchen docusate  sodium (COLACE) 100 MG capsule Take 100 mg by mouth daily as needed for mild constipation.     [name] Kitchen estradiol (VIVELLE-DOT) 0.1 MG/24HR patch Place 0.25 patches onto the skin 2 (two) times a week. Wed and Sat     . Hypromellose (ARTIFICIAL TEARS OP) Apply 1 drop to eye daily as needed (dry eyes).     [name] Kitchen levothyroxine (SYNTHROID) 88 MCG tablet TAKE 88 MCG BY MOUTH 3 DAYS PER WEEK AND TAKE 75 MCG ON ALL OTHER DAYS (Patient taking differently: Take 88 mcg by mouth daily before breakfast. ) 36 tablet 1   . psyllium (METAMUCIL) 58.6 % powder Take 1 packet by mouth daily as needed (fiber).      [name] Kitchen SAVAYSA 60 MG TABS tablet Take 1 tablet by mouth once daily 90 tablet 3   . verapamil (CALAN-SR) 120 MG CR tablet Take 1 tablet by mouth once daily 90 tablet 1   . VITAMIN D, CHOLECALCIFEROL, PO Take 4,000 Units by mouth daily.      Allergies  Allergen Reactions  . Propafenone Other (See Comments)    BP too low  . Codeine Other (See Comments)    REACTION: nausea/dizziness  . Tramadol Hcl Other (See Comments)    REACTION: nausea--dizzy    Social History   Tobacco Use  . Smoking status: Former Smoker    Packs/day: 0.50    Years: 3.00    Pack years: 1.50    Types: Cigarettes    Quit date: 09/19/1966    Years since quitting: 52.7  . Smokeless tobacco: Never Used  Substance Use Topics  . Alcohol use: Yes    Alcohol/week: 3.0 standard drinks    Types: 3 Glasses of wine per week    Comment: wine weekly    Family History  Problem Relation Age of Onset  . Heart failure Mother        died suddenly with this  . Parkinson's disease Father   . Diabetes Brother   . Healthy Daughter      Review of Systems  Constitutional: Negative.   HENT: Negative.   Eyes: Negative.   Respiratory: Negative.   Cardiovascular: Negative.   Gastrointestinal: Negative.   Genitourinary: Negative.   Musculoskeletal: Positive for back pain, joint pain and myalgias. Negative for falls and neck pain.  Skin: Negative.    Neurological: Negative.   Endo/Heme/Allergies: Negative.   Psychiatric/Behavioral: Negative.     Objective:  Physical Exam  Constitutional: She is oriented to person, place, and time. She appears well-developed and well-nourished. No distress.  HENT:  Head: Normocephalic and atraumatic.  Right Ear: External ear normal.  Left Ear: External ear normal.  Nose: Nose normal.  Mouth/Throat: Oropharynx is clear and moist.  Eyes: Conjunctivae and EOM are normal.  Neck: Normal range of motion. Neck supple.  Cardiovascular: Normal rate, regular rhythm, normal heart sounds and intact  distal pulses.  No murmur heard. Respiratory: Effort normal and breath sounds normal. No respiratory distress. She has no wheezes.  GI: Soft. Bowel sounds are normal. She exhibits no distension. There is no abdominal tenderness.  Musculoskeletal:     Comments: Bilateral Hip Exam: The range of motion: Normal, without discomfort. There is no tenderness over the greater trochanteric bursa.  Left Knee Exam: No effusion present. No swelling present. The range of motion is: Normal. No crepitus on range of motion of the knee. No medial joint line tenderness. No lateral joint line tenderness. The knee is stable.  Right Knee Exam: No effusion present. No swelling present. The range of motion is: 15 to 105 degrees. Moderate crepitus on range of motion of the knee. Medial joint line tenderness. No lateral joint line tenderness. The knee is stable  Neurological: She is alert and oriented to person, place, and time. She has normal strength. No sensory deficit.  Skin: No rash noted. She is not diaphoretic. No erythema.  Psychiatric: She has a normal mood and affect. Her behavior is normal.    Vitals Ht: 5 ft 2.5 in  Wt: 142 lbs  BMI: 25.6  BP: 132/72 sitting R arm  Pulse: 72 bpm regular   Imaging Review Plain radiographs demonstrate severe degenerative joint disease of the right knee(s). The overall  alignment ismild varus. The bone quality appears to be good for age and reported activity level.    Assessment/Plan:  End stage primary osteoarthritis, right knee   The patient history, physical examination, clinical judgment of the provider and imaging studies are consistent with end stage degenerative joint disease of the right knee(s) and total knee arthroplasty is deemed medically necessary. The treatment options including medical management, injection therapy arthroscopy and arthroplasty were discussed at length. The risks and benefits of total knee arthroplasty were presented and reviewed. The risks due to aseptic loosening, infection, stiffness, patella tracking problems, thromboembolic complications and other imponderables were discussed. The patient acknowledged the explanation, agreed to proceed with the plan and consent was signed. Patient is being admitted for inpatient treatment for surgery, pain control, PT, OT, prophylactic antibiotics, VTE prophylaxis, progressive ambulation and ADL's and discharge planning. The patient is planning to be discharged home with outpatient therapy.    Anticipated LOS equal to or greater than 2 midnights due to - Age 85 and older with one or more of the following:  - Obesity  - Expected need for hospital services (PT, OT, Nursing) required for safe  discharge  - Anticipated need for postoperative skilled nursing care or inpatient rehab  - Active co-morbidities: Cardiac Arrhythmia OR   - Unanticipated findings during/Post Surgery: None  - Patient is a high risk of re-admission due to: None   Risks and benefits of the surgery were discussed with the patient and Dr.Aluisio at their previous office visit, and the patient has elected to move forward with the aforementioned surgery. Post-operative care plans were discussed with the patient today.  Therapy Plans: outpatient therapy at [facility] Disposition: Home with daughter Planned DVT prophylaxis:  resume [facility] DME needed: has equipment PCP: Dr. Micheline Rough Cardio: [name], MD  Other: needs Zofran with pain meds Hx of constipation post op  Instructed patient on meds to take prior to surgery   Ardeen Jourdain, PA-C

## 2019-06-04 NOTE — Patient Instructions (Addendum)
DUE TO COVID-19 ONLY ONE VISITOR IS ALLOWED TO COME WITH YOU AND STAY IN THE WAITING ROOM ONLY DURING PRE OP AND PROCEDURE DAY OF SURGERY. THE 1 VISITOR MAY VISIT WITH YOU AFTER SURGERY IN YOUR PRIVATE ROOM DURING VISITING HOURS ONLY!  YOU NEED TO HAVE A COVID 19 TEST ON___9-17-2020____ @___1 :00PM____, THIS TEST MUST BE DONE BEFORE SURGERY, COME  [address] , [id number].  ([place]) ONCE YOUR COVID TEST IS COMPLETED, PLEASE BEGIN THE QUARANTINE INSTRUCTIONS AS OUTLINED IN YOUR HANDOUT.                Marissa Hart    Your procedure is scheduled on: 06-10-2019   Report to [facility] Main  Entrance   Report to admitting at 6:00AM     Call this number if you have problems the morning of surgery [place], NO CHEWING GUM [place].   Do not eat food After Midnight. YOU MAY HAVE CLEAR LIQUIDS FROM MIDNIGHT UNTIL 5:20AM. At 5:20AM Please finish the prescribed Pre-Surgery ENSURE drink. Nothing by mouth after you finish the ENSURE drink !   CLEAR LIQUID DIET   Foods Allowed                                                                     Foods Excluded  Coffee and tea, regular and decaf                             liquids that you cannot  Plain Jell-O any favor except red or purple                                           see through such as: Fruit ices (not with fruit pulp)                                     milk, soups, orange juice  Iced Popsicles                                    All solid food Carbonated beverages, regular and diet                                    Cranberry, grape and apple juices Sports drinks like Gatorade Lightly seasoned clear broth or consume(fat free) Sugar, honey syrup  Sample Menu Breakfast                                Lunch                                     Supper  Cranberry juice                    Beef broth                             Chicken broth Jell-O                                     Grape juice                           Apple juice Coffee or tea                        Jell-O                                      Popsicle                                                Coffee or tea                        Coffee or tea  _____________________________________________________________________     Take these medicines the morning of surgery with A SIP OF WATER: VERAPAMIL, SYNTHROID                                 You may not have any metal on your body including hair pins and              piercings  Do not wear jewelry, make-up, lotions, powders or perfumes, deodorant             Do not wear nail polish.  Do not shave  48 hours prior to surgery.     Do not bring valuables to the hospital. [place].  Contacts, dentures or bridgework may not be worn into surgery.  YOU MAY BRING A SMALL OVERNIGHT BAG              Please read over the following fact sheets you were given: _____________________________________________________________________             [facility] - Preparing for Surgery Before surgery, you can play an important role.  Because skin is not sterile, your skin needs to be as free of germs as possible.  You can reduce the number of germs on your skin by washing with CHG (chlorahexidine gluconate) soap before surgery.  CHG is an antiseptic cleaner which kills germs and bonds with the skin to continue killing germs even after washing. Please DO NOT use if you have an allergy to CHG or antibacterial soaps.  If your skin becomes reddened/irritated stop using the CHG and inform your nurse when you arrive at Short Stay. Do not shave (including legs and underarms) for at least 48 hours prior to the first CHG shower.  You may shave your face/neck. Please follow these instructions carefully:  1.  Shower with CHG Soap the night before surgery and the   morning of Surgery.  2.  If you choose to wash your hair, wash your hair first as usual with your  normal  shampoo.  3.  After you shampoo, rinse your hair and body thoroughly to remove the  shampoo.                           4.  Use CHG as you would any other liquid soap.  You can apply chg directly  to the skin and wash                       Gently with a scrungie or clean washcloth.  5.  Apply the CHG Soap to your body ONLY FROM THE NECK DOWN.   Do not use on face/ open                           Wound or open sores. Avoid contact with eyes, ears mouth and genitals (private parts).                       Wash face,  Genitals (private parts) with your normal soap.             6.  Wash thoroughly, paying special attention to the area where your surgery  will be performed.  7.  Thoroughly rinse your body with warm water from the neck down.  8.  DO NOT shower/wash with your normal soap after using and rinsing off  the CHG Soap.                9.  Pat yourself dry with a clean towel.            10.  Wear clean pajamas.            11.  Place clean sheets on your bed the night of your first shower and do not  sleep with pets. Day of Surgery : Do not apply any lotions/deodorants the morning of surgery.  Please wear clean clothes to the hospital/surgery center.  FAILURE TO FOLLOW THESE INSTRUCTIONS MAY RESULT IN THE CANCELLATION OF YOUR SURGERY PATIENT SIGNATURE_________________________________  NURSE SIGNATURE__________________________________  ________________________________________________________________________   Marissa Hart  An incentive spirometer is a tool that can help keep your lungs clear and active. This tool measures how well you are filling your lungs with each breath. Taking long deep breaths may help reverse or decrease the chance of developing breathing (pulmonary) problems (especially infection) following:  A long period of time when you are unable to move or be  active. BEFORE THE PROCEDURE   If the spirometer includes an indicator to show your best effort, your nurse or respiratory therapist will set it to a desired goal.  If possible, sit up straight or lean slightly forward. Try not to slouch.  Hold the incentive spirometer in an upright position. INSTRUCTIONS FOR USE  1. Sit on the edge of your bed if possible, or sit up as far as you can in bed or on a chair. 2. Hold the incentive spirometer in an upright position. 3. Breathe out normally. 4. Place the mouthpiece in your mouth and seal your lips tightly around it. 5. Breathe in slowly and as deeply as possible, raising the piston or the ball toward the top of the  column. 6. Hold your breath for 3-5 seconds or for as long as possible. Allow the piston or ball to fall to the bottom of the column. 7. Remove the mouthpiece from your mouth and breathe out normally. 8. Rest for a few seconds and repeat Steps 1 through 7 at least 10 times every 1-2 hours when you are awake. Take your time and take a few normal breaths between deep breaths. 9. The spirometer may include an indicator to show your best effort. Use the indicator as a goal to work toward during each repetition. 10. After each set of 10 deep breaths, practice coughing to be sure your lungs are clear. If you have an incision (the cut made at the time of surgery), support your incision when coughing by placing a pillow or rolled up towels firmly against it. Once you are able to get out of bed, walk around indoors and cough well. You may stop using the incentive spirometer when instructed by your caregiver.  RISKS AND COMPLICATIONS  Take your time so you do not get dizzy or light-headed.  If you are in pain, you may need to take or ask for pain medication before doing incentive spirometry. It is harder to take a deep breath if you are having pain. AFTER USE  Rest and breathe slowly and easily.  It can be helpful to keep track of a log of  your progress. Your caregiver can provide you with a simple table to help with this. If you are using the spirometer at home, follow these instructions: [place] IF:   You are having difficultly using the spirometer.  You have trouble using the spirometer as often as instructed.  Your pain medication is not giving enough relief while using the spirometer.  You develop fever of 100.5 F (38.1 C) or higher. SEEK IMMEDIATE MEDICAL CARE IF:   You cough up bloody sputum that had not been present before.  You develop fever of 102 F (38.9 C) or greater.  You develop worsening pain at or near the incision site. MAKE SURE YOU:   Understand these instructions.  Will watch your condition.  Will get help right away if you are not doing well or get worse. Document Released: 01/16/2007 Document Revised: 11/28/2011 Document Reviewed: 03/19/2007 Exit[employer] Patient Information 2014 Exit[employer], [place].   ________________________________________________________________________  WHAT IS A BLOOD TRANSFUSION? Blood Transfusion Information  A transfusion is the replacement of blood or some of its parts. Blood is made up of multiple cells which provide different functions.  Red blood cells carry oxygen and are used for blood loss replacement.  White blood cells fight against infection.  Platelets control bleeding.  Plasma helps clot blood.  Other blood products are available for specialized needs, such as hemophilia or other clotting disorders. BEFORE THE TRANSFUSION  Who gives blood for transfusions?   Healthy volunteers who are fully evaluated to make sure their blood is safe. This is blood bank blood. Transfusion therapy is the safest it has ever been in the practice of medicine. Before blood is taken from a donor, a complete history is taken to make sure that person has no history of diseases nor engages in risky social behavior (examples are intravenous drug use or sexual activity  with multiple partners). The donor's travel history is screened to minimize risk of transmitting infections, such as malaria. The donated blood is tested for signs of infectious diseases, such as HIV and hepatitis. The blood is then tested to be sure  it is compatible with you in order to minimize the chance of a transfusion reaction. If you or a relative donates blood, this is often done in anticipation of surgery and is not appropriate for emergency situations. It takes many days to process the donated blood. RISKS AND COMPLICATIONS Although transfusion therapy is very safe and saves many lives, the main dangers of transfusion include:   Getting an infectious disease.  Developing a transfusion reaction. This is an allergic reaction to something in the blood you were given. Every precaution is taken to prevent this. The decision to have a blood transfusion has been considered carefully by your caregiver before blood is given. Blood is not given unless the benefits outweigh the risks. AFTER THE TRANSFUSION  Right after receiving a blood transfusion, you will usually feel much better and more energetic. This is especially true if your red blood cells have gotten low (anemic). The transfusion raises the level of the red blood cells which carry oxygen, and this usually causes an energy increase.  The nurse administering the transfusion will monitor you carefully for complications. HOME CARE INSTRUCTIONS  No special instructions are needed after a transfusion. You may find your energy is better. Speak with your caregiver about any limitations on activity for underlying diseases you may have. SEEK MEDICAL CARE IF:   Your condition is not improving after your transfusion.  You develop redness or irritation at the intravenous (IV) site. SEEK IMMEDIATE MEDICAL CARE IF:  Any of the following symptoms occur over the next 12 hours:  Shaking chills.  You have a temperature by mouth above 102 F (38.9  C), not controlled by medicine.  Chest, back, or muscle pain.  People around you feel you are not acting correctly or are confused.  Shortness of breath or difficulty breathing.  Dizziness and fainting.  You get a rash or develop hives.  You have a decrease in urine output.  Your urine turns a dark color or changes to pink, red, or brown. Any of the following symptoms occur over the next 10 days:  You have a temperature by mouth above 102 F (38.9 C), not controlled by medicine.  Shortness of breath.  Weakness after normal activity.  The white part of the eye turns yellow (jaundice).  You have a decrease in the amount of urine or are urinating less often.  Your urine turns a dark color or changes to pink, red, or brown. Document Released: 09/02/2000 Document Revised: 11/28/2011 Document Reviewed: 04/21/2008 [facility] Patient Information 2014 [place], [place].  _______________________________________________________________________

## 2019-06-05 ENCOUNTER — Encounter (HOSPITAL_COMMUNITY)
Admission: RE | Admit: 2019-06-05 | Discharge: 2019-06-05 | Disposition: A | Discharge status: Home / Self Care | Payer: Medicare Other | Source: Ambulatory Visit | Attending: Orthopedic Surgery | Admitting: Orthopedic Surgery

## 2019-06-05 ENCOUNTER — Other Ambulatory Visit: Payer: Self-pay

## 2019-06-05 ENCOUNTER — Encounter (HOSPITAL_COMMUNITY): Payer: Self-pay

## 2019-06-05 DIAGNOSIS — I10 Essential (primary) hypertension: Secondary | ICD-10-CM | POA: Diagnosis not present

## 2019-06-05 DIAGNOSIS — M1711 Unilateral primary osteoarthritis, right knee: Secondary | ICD-10-CM | POA: Insufficient documentation

## 2019-06-05 DIAGNOSIS — Z01818 Encounter for other preprocedural examination: Secondary | ICD-10-CM | POA: Insufficient documentation

## 2019-06-05 HISTORY — DX: Nausea with vomiting, unspecified: R11.2

## 2019-06-05 HISTORY — DX: Other specified postprocedural states: Z98.890

## 2019-06-05 LAB — CBC
HCT: 41.7 % (ref 36.0–46.0)
Hemoglobin: 13.9 g/dL (ref 12.0–15.0)
MCH: 32.8 pg (ref 26.0–34.0)
MCHC: 33.3 g/dL (ref 30.0–36.0)
MCV: 98.3 fL (ref 80.0–100.0)
Platelets: 246 10*3/uL (ref 150–400)
RBC: 4.24 MIL/uL (ref 3.87–5.11)
RDW: 12.8 % (ref 11.5–15.5)
WBC: 7.3 10*3/uL (ref 4.0–10.5)
nRBC: 0 % (ref 0.0–0.2)

## 2019-06-05 LAB — COMPREHENSIVE METABOLIC PANEL
ALT: 16 U/L (ref 0–44)
AST: 20 U/L (ref 15–41)
Albumin: 3.9 g/dL (ref 3.5–5.0)
Alkaline Phosphatase: 99 U/L (ref 38–126)
Anion gap: 8 (ref 5–15)
BUN: 11 mg/dL (ref 8–23)
CO2: 27 mmol/L (ref 22–32)
Calcium: 9 mg/dL (ref 8.9–10.3)
Chloride: 101 mmol/L (ref 98–111)
Creatinine, Ser: 0.64 mg/dL (ref 0.44–1.00)
GFR calc Af Amer: >60 mL/min (ref >60)
GFR calc non Af Amer: >60 mL/min (ref >60)
Glucose, Bld: 88 mg/dL (ref 70–99)
Potassium: 3.9 mmol/L (ref 3.5–5.1)
Sodium: 136 mmol/L (ref 135–145)
Total Bilirubin: 0.4 mg/dL (ref 0.3–1.2)
Total Protein: 6.5 g/dL (ref 6.5–8.1)

## 2019-06-05 LAB — ABO/RH: ABO/RH(D): O POS

## 2019-06-05 LAB — APTT: aPTT: 34 seconds (ref 24–36)

## 2019-06-05 LAB — PROTIME-INR
INR: 1.1 (ref 0.8–1.2)
Prothrombin Time: 13.7 seconds (ref 11.4–15.2)

## 2019-06-05 NOTE — Progress Notes (Signed)
PCP - Caren Macadam, MD Cardiologist - [name], MD  Chest x-ray -  EKG - done at pre-op today  Stress Test -  ECHO -  Cardiac Cath -   Sleep Study -  CPAP -   Fasting Blood Sugar -  Checks Blood Sugar _____ times a day  Blood Thinner Instructions:EDOXABAN AND ASA 325. HOLD X 3 DAYS  Aspirin Instructions: HOLD X 3 DAYS  Last Dose: edoxaban 06-05-2019, asa 325 06-03-2019  Anesthesia review:   Hx of afib . Loop recorder in place. Cardiac clearance Kerin Ransom PA-C , Dr Rayann Heman 05-13-2019 tele note epic   Patient denies shortness of breath, fever, cough and chest pain at PAT appointment   Patient verbalized understanding of instructions that were given to them at the PAT appointment. Patient was also instructed that they will need to review over the PAT instructions again at home before surgery.

## 2019-06-06 ENCOUNTER — Other Ambulatory Visit (HOSPITAL_COMMUNITY)
Admission: RE | Admit: 2019-06-06 | Discharge: 2019-06-06 | Disposition: A | Discharge status: Home / Self Care | Payer: Medicare Other | Source: Ambulatory Visit | Attending: Orthopedic Surgery | Admitting: Orthopedic Surgery

## 2019-06-06 DIAGNOSIS — Z20828 Contact with and (suspected) exposure to other viral communicable diseases: Secondary | ICD-10-CM | POA: Insufficient documentation

## 2019-06-06 DIAGNOSIS — Z01812 Encounter for preprocedural laboratory examination: Secondary | ICD-10-CM | POA: Diagnosis not present

## 2019-06-06 NOTE — Anesthesia Preprocedure Evaluation (Addendum)
Anesthesia Evaluation  Patient identified by MRN, date of birth, ID band Patient awake    Reviewed: Allergy & Precautions, H&P , NPO status , Patient's Chart, lab work & pertinent test results  History of Anesthesia Complications (+) PONV and history of anesthetic complications  Airway Mallampati: II   Neck ROM: full    Dental   Pulmonary former smoker,    breath sounds clear to auscultation       Cardiovascular + dysrhythmias Atrial Fibrillation  Rhythm:regular Rate:Normal  S/p EP ablation   Neuro/Psych PSYCHIATRIC DISORDERS Anxiety  Neuromuscular disease    GI/Hepatic hiatal hernia, GERD  ,  Endo/Other  Hypothyroidism   Renal/GU      Musculoskeletal  (+) Arthritis , Fibromyalgia -  Abdominal   Peds  Hematology   Anesthesia Other Findings   Reproductive/Obstetrics                            Anesthesia Physical Anesthesia Plan  ASA: III  Anesthesia Plan: Spinal and MAC   Post-op Pain Management:  Regional for Post-op pain   Induction: Intravenous  PONV Risk Score and Plan: 3 and Ondansetron, Dexamethasone, Propofol infusion and Treatment may vary due to age or medical condition  Airway Management Planned: Simple Face Mask  Additional Equipment:   Intra-op Plan:   Post-operative Plan:   Informed Consent: I have reviewed the patients History and Physical, chart, labs and discussed the procedure including the risks, benefits and alternatives for the proposed anesthesia with the patient or authorized representative who has indicated his/her understanding and acceptance.       Plan Discussed with: CRNA, Anesthesiologist and Surgeon  Anesthesia Plan Comments: (See PAT note 06/05/2019, Konrad Felix, PA-C)       Anesthesia Quick Evaluation

## 2019-06-06 NOTE — Progress Notes (Signed)
Anesthesia Chart Review   Case: [id number] Date/Time: 06/10/19 0805   Procedure: TOTAL KNEE ARTHROPLASTY (Right )   Anesthesia type: Choice   Pre-op diagnosis: right knee osteoarthritis   Location: [facility] ROOM 10 / [facility]   Surgeon: Gaynelle Arabian, MD      DISCUSSION:84 y.o. former smoker (1.5 pack years, quit 09/19/66) with h/o PONV, GERD, Afib (s/p PVI and CTI 2014, on Edoxaban, last dose 06/05/2019), SVT s/p RFCA 2001, hiatal hernia, HLD, right knee OA scheduled for above procedure 06/10/2019 with Dr. Gaynelle Arabian.   Pt cleared by cardiology.  Per Kerin Ransom, PA-C, "Pt was just interviewed by Dr Rayann Heman 04/29/2019 and was cleared for surgery. Given past medical history and time since last visit, based on ACC/AHA guidelines, Marissa Hart would be at acceptable risk for the planned procedure without further cardiovascular testing. OK to hold aspirin and Savaysa 3 days pre op."  Anticipate pt can proceed with planned procedure barring acute status change.   VS: BP (!) 158/67   Pulse 74   Temp 37.1 C (Oral)   Resp 18   Ht 5\' 2"  (1.575 m)   Wt 65.6 kg   SpO2 100%   BMI 26.44 kg/m   PROVIDERS: Caren Macadam, MD is PCP   [name], MD is Cardiologist  LABS: Labs reviewed: Acceptable for surgery. (all labs ordered are listed, but only abnormal results are displayed)  Labs Reviewed  SURGICAL PCR SCREEN  APTT  CBC  COMPREHENSIVE METABOLIC PANEL  PROTIME-INR  TYPE AND SCREEN  ABO/RH     IMAGES:   EKG: 06/05/2019 Rate 73 bpm Normal sinus rhythm with sinus arrhythmia Low voltage QRS Borderline ECG No significant change since last tracing   CV:  Past Medical History:  Diagnosis Date  . Acquired renal cyst of right kidney   . Anxiety   . Atrial fibrillation and flutter ([place])    s/p PVI and CTI by Dr Rayann Heman 04/2013  . Blood transfusion   . Cataract   . CHEST WALL PAIN, HX OF 07/28/2007   Qualifier: Diagnosis of  By: Julien Girt CMA, [name]    . Diverticulosis of  colon   . DJD (degenerative joint disease) 10/18/2011  . Erysipelas   . Fibromyalgia   . FIBROMYALGIA 07/28/2007   Qualifier: Diagnosis of  By: Julien Girt CMA, [name]    . GERD (gastroesophageal reflux disease)   . Hiatal hernia   . Hot flashes - on low dose HRT with gynecologist, Dr. Nori Riis 03/31/2014  . Hx of colonic polyps   . Hyperlipidemia   . MITRAL VALVE PROLAPSE 07/28/2007   Qualifier: Diagnosis of  By: Julien Girt CMA, [name]    . PONV (postoperative nausea and vomiting)   . Supraventricular tachycardia ([place])    AV nodal reentry  s/p RFCA 2001  . Thyroid disease     Past Surgical History:  Procedure Laterality Date  . ABDOMINAL HYSTERECTOMY     heavy menses, prolonged periods. no cancer. cervix remained.   . ATRIAL FIBRILLATION ABLATION N/A 04/30/2013   Procedure: ATRIAL FIBRILLATION ABLATION;  Surgeon: [name], MD;  Location: [facility] CATH LAB;  Service: Cardiovascular;  Laterality: N/A;PVI and CTI by Dr Rayann Heman   . BREAST BIOPSY Left    benign  . BREAST BIOPSY  2011   benign  . CATARACT EXTRACTION BILATERAL W/ ANTERIOR VITRECTOMY    . COLONOSCOPY    . ESOPHAGOGASTRODUODENOSCOPY    . KNEE ARTHROSCOPY Right 2012  . KNEE SURGERY Right 08/22/2018  .  LOOP RECORDER INSERTION N/A 05/24/2017   Procedure: LOOP RECORDER INSERTION;  Surgeon: [name], MD;  Location: [place] CV LAB;  Service: Cardiovascular;  Laterality: N/A;  . OOPHORECTOMY     done for cyst on ovaries.   [name] Kitchen ROTATOR CUFF REPAIR  08/2001   Dr. Gladstone Lighter  . SVT ABLATION N/A 07/20/2017   Procedure: SVT ABLATION;  Surgeon: [name], MD;  Location: [place] CV LAB;  Service: Cardiovascular;  Laterality: N/A;  . TEE WITHOUT CARDIOVERSION N/A 04/29/2013   Procedure: TRANSESOPHAGEAL ECHOCARDIOGRAM (TEE);  Surgeon: Jettie Booze, MD;  Location: [place];  Service: Cardiovascular;  Laterality: N/A;  . UPPER GASTROINTESTINAL ENDOSCOPY      MEDICATIONS: . acetaminophen (TYLENOL) 500 MG tablet  . aspirin 325 MG  tablet  . Cyanocobalamin (B-12) 5000 MCG SUBL  . docusate sodium (COLACE) 100 MG capsule  . estradiol (VIVELLE-DOT) 0.1 MG/24HR patch  . Hypromellose (ARTIFICIAL TEARS OP)  . levothyroxine (SYNTHROID) 88 MCG tablet  . psyllium (METAMUCIL) 58.6 % powder  . SAVAYSA 60 MG TABS tablet  . verapamil (CALAN-SR) 120 MG CR tablet  . VITAMIN D, CHOLECALCIFEROL, PO   No current facility-administered medications for this encounter.      Marissa Hart [facility] Pre-Surgical Testing [phone] 06/06/19  2:54 PM

## 2019-06-07 LAB — SURGICAL PCR SCREEN
MRSA, PCR: POSITIVE — AB
Staphylococcus aureus: POSITIVE — AB

## 2019-06-07 LAB — NOVEL CORONAVIRUS, NAA (HOSP ORDER, SEND-OUT TO REF LAB; TAT 18-24 HRS): SARS-CoV-2, NAA: NOT DETECTED

## 2019-06-09 MED ORDER — BUPIVACAINE LIPOSOME 1.3 % IJ SUSP
20.0000 mL | Freq: Once | INTRAMUSCULAR | Status: DC
  Filled 2019-06-09: qty 20

## 2019-06-10 ENCOUNTER — Inpatient Hospital Stay (HOSPITAL_COMMUNITY): Payer: Medicare Other | Admitting: Physician Assistant

## 2019-06-10 ENCOUNTER — Inpatient Hospital Stay (HOSPITAL_COMMUNITY): Payer: Medicare Other | Admitting: Certified Registered Nurse Anesthetist

## 2019-06-10 ENCOUNTER — Ambulatory Visit (INDEPENDENT_AMBULATORY_CARE_PROVIDER_SITE_OTHER): Payer: Medicare Other | Admitting: *Deleted

## 2019-06-10 ENCOUNTER — Inpatient Hospital Stay (HOSPITAL_COMMUNITY)
Admission: RE | Admit: 2019-06-10 | Discharge: 2019-06-11 | DRG: 470 | Disposition: A | Discharge status: Home / Self Care | Payer: Medicare Other | Attending: Orthopedic Surgery | Admitting: Orthopedic Surgery

## 2019-06-10 ENCOUNTER — Other Ambulatory Visit: Payer: Self-pay

## 2019-06-10 ENCOUNTER — Encounter (HOSPITAL_COMMUNITY): Payer: Self-pay | Admitting: Certified Registered Nurse Anesthetist

## 2019-06-10 ENCOUNTER — Encounter (HOSPITAL_COMMUNITY)
Admission: RE | Disposition: A | Discharge status: Home / Self Care | Payer: Self-pay | Source: Home / Self Care | Attending: Orthopedic Surgery

## 2019-06-10 DIAGNOSIS — K219 Gastro-esophageal reflux disease without esophagitis: Secondary | ICD-10-CM | POA: Diagnosis present

## 2019-06-10 DIAGNOSIS — Z820 Family history of epilepsy and other diseases of the nervous system: Secondary | ICD-10-CM

## 2019-06-10 DIAGNOSIS — M797 Fibromyalgia: Secondary | ICD-10-CM | POA: Diagnosis not present

## 2019-06-10 DIAGNOSIS — M1711 Unilateral primary osteoarthritis, right knee: Principal | ICD-10-CM | POA: Diagnosis present

## 2019-06-10 DIAGNOSIS — I341 Nonrheumatic mitral (valve) prolapse: Secondary | ICD-10-CM | POA: Diagnosis present

## 2019-06-10 DIAGNOSIS — Z79899 Other long term (current) drug therapy: Secondary | ICD-10-CM

## 2019-06-10 DIAGNOSIS — Z9842 Cataract extraction status, left eye: Secondary | ICD-10-CM

## 2019-06-10 DIAGNOSIS — Z9841 Cataract extraction status, right eye: Secondary | ICD-10-CM | POA: Diagnosis not present

## 2019-06-10 DIAGNOSIS — Z7982 Long term (current) use of aspirin: Secondary | ICD-10-CM | POA: Diagnosis not present

## 2019-06-10 DIAGNOSIS — Z8249 Family history of ischemic heart disease and other diseases of the circulatory system: Secondary | ICD-10-CM

## 2019-06-10 DIAGNOSIS — Z87891 Personal history of nicotine dependence: Secondary | ICD-10-CM

## 2019-06-10 DIAGNOSIS — Z90710 Acquired absence of both cervix and uterus: Secondary | ICD-10-CM

## 2019-06-10 DIAGNOSIS — Z79890 Hormone replacement therapy (postmenopausal): Secondary | ICD-10-CM | POA: Diagnosis not present

## 2019-06-10 DIAGNOSIS — I480 Paroxysmal atrial fibrillation: Secondary | ICD-10-CM | POA: Diagnosis not present

## 2019-06-10 DIAGNOSIS — G8918 Other acute postprocedural pain: Secondary | ICD-10-CM | POA: Diagnosis not present

## 2019-06-10 DIAGNOSIS — E039 Hypothyroidism, unspecified: Secondary | ICD-10-CM | POA: Diagnosis present

## 2019-06-10 DIAGNOSIS — Z86010 Personal history of colonic polyps: Secondary | ICD-10-CM

## 2019-06-10 DIAGNOSIS — Z885 Allergy status to narcotic agent status: Secondary | ICD-10-CM

## 2019-06-10 DIAGNOSIS — M1710 Unilateral primary osteoarthritis, unspecified knee: Secondary | ICD-10-CM

## 2019-06-10 DIAGNOSIS — M179 Osteoarthritis of knee, unspecified: Secondary | ICD-10-CM | POA: Diagnosis present

## 2019-06-10 DIAGNOSIS — F419 Anxiety disorder, unspecified: Secondary | ICD-10-CM | POA: Diagnosis present

## 2019-06-10 DIAGNOSIS — E785 Hyperlipidemia, unspecified: Secondary | ICD-10-CM | POA: Diagnosis present

## 2019-06-10 DIAGNOSIS — Z833 Family history of diabetes mellitus: Secondary | ICD-10-CM

## 2019-06-10 DIAGNOSIS — M25761 Osteophyte, right knee: Secondary | ICD-10-CM | POA: Diagnosis present

## 2019-06-10 DIAGNOSIS — I4891 Unspecified atrial fibrillation: Secondary | ICD-10-CM | POA: Diagnosis not present

## 2019-06-10 HISTORY — PX: TOTAL KNEE ARTHROPLASTY: SHX125

## 2019-06-10 LAB — CUP PACEART REMOTE DEVICE CHECK
Date Time Interrogation Session: 20200919153957
Implantable Pulse Generator Implant Date: 20180905

## 2019-06-10 LAB — TYPE AND SCREEN
ABO/RH(D): O POS
Antibody Screen: NEGATIVE

## 2019-06-10 SURGERY — ARTHROPLASTY, KNEE, TOTAL
Anesthesia: Monitor Anesthesia Care | Site: Knee | Laterality: Right

## 2019-06-10 MED ORDER — SODIUM CHLORIDE 0.9 % IR SOLN
Status: DC | PRN
  Administered 2019-06-10: 1000 mL

## 2019-06-10 MED ORDER — PROPOFOL 10 MG/ML IV BOLUS
INTRAVENOUS | Status: DC | PRN
  Administered 2019-06-10: 20 mg via INTRAVENOUS

## 2019-06-10 MED ORDER — METOCLOPRAMIDE HCL 5 MG PO TABS: 5.0000 mg | ORAL_TABLET | Freq: Three times a day (TID) | ORAL | Status: DC | PRN

## 2019-06-10 MED ORDER — FENTANYL CITRATE (PF) 100 MCG/2ML IJ SOLN
INTRAMUSCULAR | Status: AC
  Administered 2019-06-10: 50 ug
  Filled 2019-06-10: qty 2

## 2019-06-10 MED ORDER — LIDOCAINE 2% (20 MG/ML) 5 ML SYRINGE
INTRAMUSCULAR | Status: AC
  Filled 2019-06-10: qty 5

## 2019-06-10 MED ORDER — DIPHENHYDRAMINE HCL 12.5 MG/5ML PO ELIX: 12.5000 mg | ORAL_SOLUTION | ORAL | Status: DC | PRN

## 2019-06-10 MED ORDER — SODIUM CHLORIDE (PF) 0.9 % IJ SOLN
INTRAMUSCULAR | Status: DC | PRN
  Administered 2019-06-10: 60 mL

## 2019-06-10 MED ORDER — DEXAMETHASONE SODIUM PHOSPHATE 10 MG/ML IJ SOLN
INTRAMUSCULAR | Status: DC | PRN
  Administered 2019-06-10: 5 mg via INTRAVENOUS

## 2019-06-10 MED ORDER — ACETAMINOPHEN 10 MG/ML IV SOLN
1000.0000 mg | Freq: Four times a day (QID) | INTRAVENOUS | Status: DC
  Administered 2019-06-10: 1000 mg via INTRAVENOUS
  Filled 2019-06-10: qty 100

## 2019-06-10 MED ORDER — CEFAZOLIN SODIUM-DEXTROSE 1-4 GM/50ML-% IV SOLN
1.0000 g | Freq: Four times a day (QID) | INTRAVENOUS | Status: AC
  Administered 2019-06-10 (×2): 1 g via INTRAVENOUS
  Filled 2019-06-10 (×2): qty 50

## 2019-06-10 MED ORDER — OXYCODONE HCL 5 MG PO TABS: 5.0000 mg | ORAL_TABLET | Freq: Once | ORAL | Status: DC | PRN

## 2019-06-10 MED ORDER — VANCOMYCIN HCL IN DEXTROSE 1-5 GM/200ML-% IV SOLN
1000.0000 mg | INTRAVENOUS | Status: AC
  Administered 2019-06-10 (×2): 1000 mg via INTRAVENOUS
  Filled 2019-06-10: qty 200

## 2019-06-10 MED ORDER — METHOCARBAMOL 500 MG IVPB - SIMPLE MED
500.0000 mg | Freq: Four times a day (QID) | INTRAVENOUS | Status: DC | PRN
  Filled 2019-06-10: qty 50

## 2019-06-10 MED ORDER — LIDOCAINE 2% (20 MG/ML) 5 ML SYRINGE
INTRAMUSCULAR | Status: DC | PRN
  Administered 2019-06-10: 60 mg via INTRAVENOUS

## 2019-06-10 MED ORDER — VERAPAMIL HCL ER 120 MG PO TBCR
120.0000 mg | EXTENDED_RELEASE_TABLET | Freq: Every day | ORAL | Status: DC
  Administered 2019-06-11: 120 mg via ORAL
  Filled 2019-06-10: qty 1

## 2019-06-10 MED ORDER — SODIUM CHLORIDE (PF) 0.9 % IJ SOLN
INTRAMUSCULAR | Status: AC
  Filled 2019-06-10: qty 10

## 2019-06-10 MED ORDER — CEFAZOLIN SODIUM-DEXTROSE 2-4 GM/100ML-% IV SOLN
2.0000 g | INTRAVENOUS | Status: AC
  Administered 2019-06-10: 2 g via INTRAVENOUS
  Filled 2019-06-10: qty 100

## 2019-06-10 MED ORDER — LACTATED RINGERS IV SOLN
INTRAVENOUS | Status: DC
  Administered 2019-06-10: 07:00:00 via INTRAVENOUS

## 2019-06-10 MED ORDER — SODIUM CHLORIDE 0.9 % IV SOLN
INTRAVENOUS | Status: DC
  Administered 2019-06-10: 13:00:00 via INTRAVENOUS

## 2019-06-10 MED ORDER — PROPOFOL 10 MG/ML IV BOLUS
INTRAVENOUS | Status: AC
  Filled 2019-06-10: qty 60

## 2019-06-10 MED ORDER — PHENYLEPHRINE HCL (PRESSORS) 10 MG/ML IV SOLN
INTRAVENOUS | Status: AC
  Filled 2019-06-10: qty 1

## 2019-06-10 MED ORDER — FLEET ENEMA 7-19 GM/118ML RE ENEM: 1.0000 | ENEMA | Freq: Once | RECTAL | Status: DC | PRN

## 2019-06-10 MED ORDER — ONDANSETRON HCL 4 MG/2ML IJ SOLN
INTRAMUSCULAR | Status: DC | PRN
  Administered 2019-06-10: 4 mg via INTRAVENOUS

## 2019-06-10 MED ORDER — GABAPENTIN 100 MG PO CAPS
100.0000 mg | ORAL_CAPSULE | Freq: Three times a day (TID) | ORAL | Status: DC
  Administered 2019-06-10 – 2019-06-11 (×4): 100 mg via ORAL
  Filled 2019-06-10 (×4): qty 1

## 2019-06-10 MED ORDER — ROPIVACAINE HCL 5 MG/ML IJ SOLN
INTRAMUSCULAR | Status: DC | PRN
  Administered 2019-06-10: 20 mL via PERINEURAL

## 2019-06-10 MED ORDER — FENTANYL CITRATE (PF) 100 MCG/2ML IJ SOLN: 25.0000 ug | INTRAMUSCULAR | Status: DC | PRN

## 2019-06-10 MED ORDER — BUPIVACAINE LIPOSOME 1.3 % IJ SUSP
INTRAMUSCULAR | Status: DC | PRN
  Administered 2019-06-10: 20 mL

## 2019-06-10 MED ORDER — POLYETHYLENE GLYCOL 3350 17 G PO PACK: 17.0000 g | PACK | Freq: Every day | ORAL | Status: DC | PRN

## 2019-06-10 MED ORDER — SODIUM CHLORIDE 0.9 % IV SOLN
INTRAVENOUS | Status: DC | PRN
  Administered 2019-06-10: 40 ug/min via INTRAVENOUS

## 2019-06-10 MED ORDER — DOCUSATE SODIUM 100 MG PO CAPS
100.0000 mg | ORAL_CAPSULE | Freq: Two times a day (BID) | ORAL | Status: DC
  Administered 2019-06-10 – 2019-06-11 (×2): 100 mg via ORAL
  Filled 2019-06-10 (×2): qty 1

## 2019-06-10 MED ORDER — MENTHOL 3 MG MT LOZG: 1.0000 | LOZENGE | OROMUCOSAL | Status: DC | PRN

## 2019-06-10 MED ORDER — LEVOTHYROXINE SODIUM 88 MCG PO TABS: 88.0000 ug | ORAL_TABLET | ORAL | Status: DC

## 2019-06-10 MED ORDER — EDOXABAN TOSYLATE 30 MG PO TABS
60.0000 mg | ORAL_TABLET | Freq: Every day | ORAL | Status: DC
  Administered 2019-06-11: 60 mg via ORAL
  Filled 2019-06-10: qty 2

## 2019-06-10 MED ORDER — CHLORHEXIDINE GLUCONATE 4 % EX LIQD: 60.0000 mL | Freq: Once | CUTANEOUS | Status: DC

## 2019-06-10 MED ORDER — MORPHINE SULFATE (PF) 2 MG/ML IV SOLN: 1.0000 mg | INTRAVENOUS | Status: DC | PRN

## 2019-06-10 MED ORDER — PHENOL 1.4 % MT LIQD
1.0000 | OROMUCOSAL | Status: DC | PRN
  Filled 2019-06-10: qty 177

## 2019-06-10 MED ORDER — DEXAMETHASONE SODIUM PHOSPHATE 10 MG/ML IJ SOLN: 10.0000 mg | Freq: Once | INTRAMUSCULAR | Status: DC

## 2019-06-10 MED ORDER — ONDANSETRON HCL 4 MG/2ML IJ SOLN
INTRAMUSCULAR | Status: AC
  Filled 2019-06-10: qty 2

## 2019-06-10 MED ORDER — ONDANSETRON HCL 4 MG/2ML IJ SOLN: 4.0000 mg | Freq: Four times a day (QID) | INTRAMUSCULAR | Status: DC | PRN

## 2019-06-10 MED ORDER — ASPIRIN 81 MG PO CHEW
81.0000 mg | CHEWABLE_TABLET | Freq: Every day | ORAL | Status: DC
  Administered 2019-06-11: 81 mg via ORAL
  Filled 2019-06-10: qty 1

## 2019-06-10 MED ORDER — POVIDONE-IODINE 10 % EX SWAB
2.0000 "application " | Freq: Once | CUTANEOUS | Status: AC
  Administered 2019-06-10: 2 via TOPICAL

## 2019-06-10 MED ORDER — DEXAMETHASONE SODIUM PHOSPHATE 10 MG/ML IJ SOLN: 8.0000 mg | Freq: Once | INTRAMUSCULAR | Status: DC

## 2019-06-10 MED ORDER — METOCLOPRAMIDE HCL 5 MG/ML IJ SOLN: 5.0000 mg | Freq: Three times a day (TID) | INTRAMUSCULAR | Status: DC | PRN

## 2019-06-10 MED ORDER — TRANEXAMIC ACID-NACL 1000-0.7 MG/100ML-% IV SOLN
1000.0000 mg | INTRAVENOUS | Status: AC
  Administered 2019-06-10: 1000 mg via INTRAVENOUS
  Filled 2019-06-10: qty 100

## 2019-06-10 MED ORDER — METHOCARBAMOL 500 MG PO TABS
500.0000 mg | ORAL_TABLET | Freq: Four times a day (QID) | ORAL | Status: DC | PRN
  Administered 2019-06-10: 500 mg via ORAL
  Filled 2019-06-10: qty 1

## 2019-06-10 MED ORDER — BISACODYL 10 MG RE SUPP: 10.0000 mg | Freq: Every day | RECTAL | Status: DC | PRN

## 2019-06-10 MED ORDER — BUPIVACAINE IN DEXTROSE 0.75-8.25 % IT SOLN
INTRATHECAL | Status: DC | PRN
  Administered 2019-06-10: 1.6 mL via INTRATHECAL

## 2019-06-10 MED ORDER — PROPOFOL 500 MG/50ML IV EMUL
INTRAVENOUS | Status: DC | PRN
  Administered 2019-06-10: 75 ug/kg/min via INTRAVENOUS

## 2019-06-10 MED ORDER — HYDROCODONE-ACETAMINOPHEN 5-325 MG PO TABS
1.0000 | ORAL_TABLET | ORAL | Status: DC | PRN
  Administered 2019-06-10: 1 via ORAL
  Administered 2019-06-10 – 2019-06-11 (×6): 2 via ORAL
  Filled 2019-06-10 (×4): qty 2
  Filled 2019-06-10: qty 1
  Filled 2019-06-10 (×2): qty 2

## 2019-06-10 MED ORDER — OXYCODONE HCL 5 MG/5ML PO SOLN: 5.0000 mg | Freq: Once | ORAL | Status: DC | PRN

## 2019-06-10 MED ORDER — LEVOTHYROXINE SODIUM 75 MCG PO TABS
75.0000 ug | ORAL_TABLET | ORAL | Status: DC
  Administered 2019-06-11: 75 ug via ORAL
  Filled 2019-06-10: qty 1

## 2019-06-10 MED ORDER — ONDANSETRON HCL 4 MG PO TABS: 4.0000 mg | ORAL_TABLET | Freq: Four times a day (QID) | ORAL | Status: DC | PRN

## 2019-06-10 MED ORDER — SODIUM CHLORIDE (PF) 0.9 % IJ SOLN
INTRAMUSCULAR | Status: AC
  Filled 2019-06-10: qty 50

## 2019-06-10 MED ORDER — DEXAMETHASONE SODIUM PHOSPHATE 10 MG/ML IJ SOLN
INTRAMUSCULAR | Status: AC
  Filled 2019-06-10: qty 1

## 2019-06-10 SURGICAL SUPPLY — 52 items
BAG ZIPLOCK 12X15 (MISCELLANEOUS) ×2 IMPLANT
BLADE SAG 18X100X1.27 (BLADE) ×2 IMPLANT
BLADE SAW SGTL 11.0X1.19X90.0M (BLADE) ×2 IMPLANT
BNDG ELASTIC 6X5.8 VLCR STR LF (GAUZE/BANDAGES/DRESSINGS) ×2 IMPLANT
BOWL SMART MIX CTS (DISPOSABLE) ×2 IMPLANT
CEMENT HV SMART SET (Cement) ×4 IMPLANT
CEMENT TIBIA MBT SIZE 2.5 (Knees) IMPLANT
COVER SURGICAL LIGHT HANDLE (MISCELLANEOUS) ×2 IMPLANT
COVER WAND RF STERILE (DRAPES) IMPLANT
CUFF TOURN SGL QUICK 34 (TOURNIQUET CUFF) ×1
CUFF TRNQT CYL 34X4.125X (TOURNIQUET CUFF) ×1 IMPLANT
DECANTER SPIKE VIAL GLASS SM (MISCELLANEOUS) ×2 IMPLANT
DRAPE U-SHAPE 47X51 STRL (DRAPES) ×2 IMPLANT
DRSG ADAPTIC 3X8 NADH LF (GAUZE/BANDAGES/DRESSINGS) ×2 IMPLANT
DRSG PAD ABDOMINAL 8X10 ST (GAUZE/BANDAGES/DRESSINGS) ×2 IMPLANT
DURAPREP 26ML APPLICATOR (WOUND CARE) ×2 IMPLANT
ELECT REM PT RETURN 15FT ADLT (MISCELLANEOUS) ×2 IMPLANT
EVACUATOR 1/8 PVC DRAIN (DRAIN) ×2 IMPLANT
FEMUR SIGMA PS SZ 2.5 R (Femur) ×1 IMPLANT
GAUZE SPONGE 4X4 12PLY STRL (GAUZE/BANDAGES/DRESSINGS) ×2 IMPLANT
GLOVE BIO SURGEON STRL SZ8 (GLOVE) ×2 IMPLANT
GLOVE BIOGEL PI IND STRL 6.5 (GLOVE) ×1 IMPLANT
GLOVE BIOGEL PI IND STRL 8 (GLOVE) ×1 IMPLANT
GLOVE BIOGEL PI INDICATOR 6.5 (GLOVE) ×1
GLOVE BIOGEL PI INDICATOR 8 (GLOVE) ×1
GLOVE SURG SS PI 6.5 STRL IVOR (GLOVE) ×2 IMPLANT
GOWN STRL REUS W/TWL LRG LVL3 (GOWN DISPOSABLE) ×5 IMPLANT
HANDPIECE INTERPULSE COAX TIP (DISPOSABLE) ×1
HOLDER FOLEY CATH W/STRAP (MISCELLANEOUS) ×1 IMPLANT
IMMOBILIZER KNEE 20 (SOFTGOODS) ×2
IMMOBILIZER KNEE 20 THIGH 36 (SOFTGOODS) ×1 IMPLANT
KIT TURNOVER KIT A (KITS) IMPLANT
MANIFOLD NEPTUNE II (INSTRUMENTS) ×2 IMPLANT
NS IRRIG 1000ML POUR BTL (IV SOLUTION) ×2 IMPLANT
PACK TOTAL KNEE CUSTOM (KITS) ×2 IMPLANT
PADDING CAST COTTON 6X4 STRL (CAST SUPPLIES) ×6 IMPLANT
PATELLA DOME PFC 32MM (Knees) ×1 IMPLANT
PIN STEINMAN FIXATION KNEE (PIN) ×1 IMPLANT
PLATE ROT INSERT 10MM SIZE 2.5 (Plate) ×1 IMPLANT
PROTECTOR NERVE ULNAR (MISCELLANEOUS) ×2 IMPLANT
SET HNDPC FAN SPRY TIP SCT (DISPOSABLE) ×1 IMPLANT
STRIP CLOSURE SKIN 1/2X4 (GAUZE/BANDAGES/DRESSINGS) ×4 IMPLANT
SUT MNCRL AB 4-0 PS2 18 (SUTURE) ×2 IMPLANT
SUT STRATAFIX 0 PDS 27 VIOLET (SUTURE) ×2
SUT VIC AB 2-0 CT1 27 (SUTURE) ×3
SUT VIC AB 2-0 CT1 TAPERPNT 27 (SUTURE) ×3 IMPLANT
SUTURE STRATFX 0 PDS 27 VIOLET (SUTURE) ×1 IMPLANT
TIBIA MBT CEMENT SIZE 2.5 (Knees) ×2 IMPLANT
TRAY FOLEY MTR SLVR 14FR STAT (SET/KITS/TRAYS/PACK) ×1 IMPLANT
WATER STERILE IRR 1000ML POUR (IV SOLUTION) ×4 IMPLANT
WRAP KNEE MAXI GEL POST OP (GAUZE/BANDAGES/DRESSINGS) ×2 IMPLANT
YANKAUER SUCT BULB TIP 10FT TU (MISCELLANEOUS) ×2 IMPLANT

## 2019-06-10 NOTE — Evaluation (Signed)
Physical Therapy Evaluation Patient Details Name: Marissa Hart MRN: [id number] DOB: [birth year] Today's Date: 06/10/2019   History of Present Illness  s/p R TKA [id number], fibromyalgia  Clinical Impression  Pt is s/p TKA resulting in the deficits listed below (see PT Problem List).  PT amb 69' with RW and min assist; anticipate steady progress in acute setting. See below for further details. Continue PT in acute setting   Pt will benefit from skilled PT to increase their independence and safety with mobility to allow discharge to the venue listed below.      Follow Up Recommendations Follow surgeon's recommendation for DC plan and follow-up therapies    Equipment Recommendations  None recommended by PT    Recommendations for Other Services       Precautions / Restrictions Precautions Precautions: Knee;Fall Required Braces or Orthoses: Knee Immobilizer - Right Knee Immobilizer - Right: Discontinue once straight leg raise with < 10 degree lag Restrictions Weight Bearing Restrictions: No Other Position/Activity Restrictions: WBAT      Mobility  Bed Mobility Overal bed mobility: Needs Assistance Bed Mobility: Supine to Sit     Supine to sit: Min assist     General bed mobility comments: assist with RLE  Transfers Overall transfer level: Needs assistance Equipment used: Rolling walker (2 wheeled) Transfers: Sit to/from Stand Sit to Stand: Min assist         General transfer comment: cues for hand and RLE position  Ambulation/Gait Ambulation/Gait assistance: Min guard;Min assist Gait Distance (Feet): 60 Feet Assistive device: Rolling walker (2 wheeled) Gait Pattern/deviations: Step-to pattern;Decreased weight shift to right     General Gait Details: cues for sequence and RW position  Stairs            Wheelchair Mobility    Modified Rankin (Stroke Patients Only)       Balance                                              Pertinent Vitals/Pain Pain Assessment: 0-10 Pain Location: right knee Pain Descriptors / Indicators: Grimacing;Sore Pain Intervention(s): Limited activity within patient's tolerance;Monitored during session    [place] expects to be discharged to:: Private residence Living Arrangements: Alone Available Help at Discharge: Available 24 hours/day Type of Home: Apartment Home Access: Stairs to enter   [employer] of Steps: 2 Home Layout: One level Home Equipment: [place] - 2 wheels;Walker - 4 wheels;Grab bars - toilet;Grab bars - tub/shower;Shower seat;Cane - single point      Prior Function Level of Independence: Independent;Independent with assistive device(s)         Comments: amb with rollator,  amb with cane     Hand Dominance        Extremity/Trunk Assessment   Upper Extremity Assessment Upper Extremity Assessment: Overall WFL for tasks assessed    Lower Extremity Assessment Lower Extremity Assessment: RLE deficits/detail RLE Deficits / Details: ankle WFL, knee extension and hip extension 2+/5       Communication   Communication: No difficulties  Cognition Arousal/Alertness: Awake/alert Behavior During Therapy: WFL for tasks assessed/performed Overall Cognitive Status: Within Functional Limits for tasks assessed  General Comments      Exercises Total Joint Exercises Ankle Circles/Pumps: AROM;Both;10 reps Quad Sets: AROM;Both;5 reps   Assessment/Plan    PT Assessment Patient needs continued PT services  PT Problem List Decreased strength;Decreased range of motion;Decreased activity tolerance;Decreased mobility;Pain;Decreased knowledge of use of DME;Decreased knowledge of precautions       PT Treatment Interventions DME instruction;Gait training;Therapeutic exercise;Functional mobility training;Therapeutic activities;Patient/family education;Stair training     PT Goals (Current goals can be found in the Care Plan section)  Acute Rehab PT Goals Patient Stated Goal: home tomorrow PT Goal Formulation: With patient Time For Goal Achievement: 06/17/19 Potential to Achieve Goals: Good    Frequency 7X/week   Barriers to discharge        Co-evaluation               AM-PAC PT "6 Clicks" Mobility  Outcome Measure Help needed turning from your back to your side while in a flat bed without using bedrails?: A Little Help needed moving from lying on your back to sitting on the side of a flat bed without using bedrails?: A Little Help needed moving to and from a bed to a chair (including a wheelchair)?: A Little Help needed standing up from a chair using your arms (e.g., wheelchair or bedside chair)?: A Little Help needed to walk in hospital room?: A Little Help needed climbing 3-5 steps with a railing? : A Lot 6 Click Score: 17    End of Session Equipment Utilized During Treatment: Gait belt;Right knee immobilizer Activity Tolerance: Patient tolerated treatment well Patient left: in chair;with call bell/phone within reach;with chair alarm set;with family/visitor present   PT Visit Diagnosis: Difficulty in walking, not elsewhere classified (R26.2)    Time: [id number] PT Time Calculation (min) (ACUTE ONLY): 29 min   Charges:   PT Evaluation $PT Eval Low Complexity: 1 Low PT Treatments $Gait Training: 8-22 mins        Kenyon Ana, PT  Pager: [phone] Acute Rehab Dept [facility]): [phone]   06/10/2019   [facility] 06/10/2019, 2:14 PM

## 2019-06-10 NOTE — Interval H&P Note (Signed)
History and Physical Interval Note:  06/10/2019 6:19 AM  Marissa Hart  has presented today for surgery, with the diagnosis of right knee osteoarthritis.  The various methods of treatment have been discussed with the patient and family. After consideration of risks, benefits and other options for treatment, the patient has consented to  Procedure(s): TOTAL KNEE ARTHROPLASTY (Right) as a surgical intervention.  The patient's history has been reviewed, patient examined, no change in status, stable for surgery.  I have reviewed the patient's chart and labs.  Questions were answered to the patient's satisfaction.     Pilar Plate Aman Bonet

## 2019-06-10 NOTE — Discharge Instructions (Signed)
° °Dr. Frank Aluisio °Total Joint Specialist °[facility] °[address] °[place], [place] [address] °[phone] ° °TOTAL KNEE REPLACEMENT POSTOPERATIVE DIRECTIONS ° °Knee Rehabilitation, Guidelines Following Surgery  °Results after knee surgery are often greatly improved when you follow the exercise, range of motion and muscle strengthening exercises prescribed by your doctor. Safety measures are also important to protect the knee from further injury. Any time any of these exercises cause you to have increased pain or swelling in your knee joint, decrease the amount until you are comfortable again and slowly increase them. If you have problems or questions, call your caregiver or physical therapist for advice.  ° °HOME CARE INSTRUCTIONS  °• Remove items at home which could result in a fall. This includes throw rugs or furniture in walking pathways.  °· ICE to the affected knee every three hours for 30 minutes at a time and then as needed for pain and swelling.  Continue to use ice on the knee for pain and swelling from surgery. You may notice swelling that will progress down to the foot and ankle.  This is normal after surgery.  Elevate the leg when you are not up walking on it.   °· Continue to use the breathing machine which will help keep your temperature down.  It is common for your temperature to cycle up and down following surgery, especially at night when you are not up moving around and exerting yourself.  The breathing machine keeps your lungs expanded and your temperature down. °· Do not place pillow under knee, focus on keeping the knee straight while resting ° °DIET °You may resume your previous home diet once your are discharged from the hospital. ° °DRESSING / WOUND CARE / SHOWERING °You may shower 3 days after surgery, but keep the wounds dry during showering.  You may use an occlusive plastic wrap (Press'n Seal for example), NO SOAKING/SUBMERGING IN THE BATHTUB.  If the bandage  gets wet, change with a clean dry gauze.  If the incision gets wet, pat the wound dry with a clean towel. °You may start showering once you are discharged home but do not submerge the incision under water. Just pat the incision dry and apply a dry gauze dressing on daily. °Change the surgical dressing daily and reapply a dry dressing each time. ° °ACTIVITY °Walk with your walker as instructed. °Use walker as long as suggested by your caregivers. °Avoid periods of inactivity such as sitting longer than an hour when not asleep. This helps prevent blood clots.  °You may resume a sexual relationship in one month or when given the OK by your doctor.  °You may return to work once you are cleared by your doctor.  °Do not drive a car for 6 weeks or until released by you surgeon.  °Do not drive while taking narcotics. ° °WEIGHT BEARING °Weight bearing as tolerated with assist device (walker, cane, etc) as directed, use it as long as suggested by your surgeon or therapist, typically at least 4-6 weeks. ° °POSTOPERATIVE CONSTIPATION PROTOCOL °Constipation - defined medically as fewer than three stools per week and severe constipation as less than one stool per week. ° °One of the most common issues patients have following surgery is constipation.  Even if you have a regular bowel pattern at home, your normal regimen is likely to be disrupted due to multiple reasons following surgery.  Combination of anesthesia, postoperative narcotics, change in appetite and fluid intake all can affect your bowels.    In order to avoid complications following surgery, here are some recommendations in order to help you during your recovery period. ° °Colace (docusate) - Pick up an over-the-counter form of Colace or another stool softener and take twice a day as long as you are requiring postoperative pain medications.  Take with a full glass of water daily.  If you experience loose stools or diarrhea, hold the colace until you stool forms back  up.  If your symptoms do not get better within 1 week or if they get worse, check with your doctor. ° °Dulcolax (bisacodyl) - Pick up over-the-counter and take as directed by the product packaging as needed to assist with the movement of your bowels.  Take with a full glass of water.  Use this product as needed if not relieved by Colace only.  ° °MiraLax (polyethylene glycol) - Pick up over-the-counter to have on hand.  MiraLax is a solution that will increase the amount of water in your bowels to assist with bowel movements.  Take as directed and can mix with a glass of water, juice, soda, coffee, or tea.  Take if you go more than two days without a movement. °Do not use MiraLax more than once per day. Call your doctor if you are still constipated or irregular after using this medication for 7 days in a row. ° °If you continue to have problems with postoperative constipation, please contact the office for further assistance and recommendations.  If you experience "the worst abdominal pain ever" or develop nausea or vomiting, please contact the office immediatly for further recommendations for treatment. ° °ITCHING ° If you experience itching with your medications, try taking only a single pain pill, or even half a pain pill at a time.  You can also use Benadryl over the counter for itching or also to help with sleep.  ° °TED HOSE STOCKINGS °Wear the elastic stockings on both legs for three weeks following surgery during the day but you may remove then at night for sleeping. ° °MEDICATIONS °See your medication summary on the “After Visit Summary” that the nursing staff will review with you prior to discharge.  You may have some home medications which will be placed on hold until you complete the course of blood thinner medication.  It is important for you to complete the blood thinner medication as prescribed by your surgeon.  Continue your approved medications as instructed at time of discharge. ° °PRECAUTIONS °If  you experience chest pain or shortness of breath - call 911 immediately for transfer to the hospital emergency department.  °If you develop a fever greater that 101 F, purulent drainage from wound, increased redness or drainage from wound, foul odor from the wound/dressing, or calf pain - CONTACT YOUR SURGEON.   °                                                °FOLLOW-UP APPOINTMENTS °Make sure you keep all of your appointments after your operation with your surgeon and caregivers. You should call the office at the above phone number and make an appointment for approximately two weeks after the date of your surgery or on the date instructed by your surgeon outlined in the "After Visit Summary". ° ° °RANGE OF MOTION AND STRENGTHENING EXERCISES  °Rehabilitation of the knee is important following a knee injury or   an operation. After just a few days of immobilization, the muscles of the thigh which control the knee become weakened and shrink (atrophy). Knee exercises are designed to build up the tone and strength of the thigh muscles and to improve knee motion. Often times heat used for twenty to thirty minutes before working out will loosen up your tissues and help with improving the range of motion but do not use heat for the first two weeks following surgery. These exercises can be done on a training (exercise) mat, on the floor, on a table or on a bed. Use what ever works the best and is most comfortable for you Knee exercises include:  °• Leg Lifts - While your knee is still immobilized in a splint or cast, you can do straight leg raises. Lift the leg to 60 degrees, hold for 3 sec, and slowly lower the leg. Repeat 10-20 times 2-3 times daily. Perform this exercise against resistance later as your knee gets better.  °• Quad and Hamstring Sets - Tighten up the muscle on the front of the thigh (Quad) and hold for 5-10 sec. Repeat this 10-20 times hourly. Hamstring sets are done by pushing the foot backward against an  object and holding for 5-10 sec. Repeat as with quad sets.  °· Leg Slides: Lying on your back, slowly slide your foot toward your buttocks, bending your knee up off the floor (only go as far as is comfortable). Then slowly slide your foot back down until your leg is flat on the floor again. °· Angel Wings: Lying on your back spread your legs to the side as far apart as you can without causing discomfort.  °A rehabilitation program following serious knee injuries can speed recovery and prevent re-injury in the future due to weakened muscles. Contact your doctor or a physical therapist for more information on knee rehabilitation.  ° °IF YOU ARE TRANSFERRED TO A SKILLED REHAB FACILITY °If the patient is transferred to a skilled rehab facility following release from the hospital, a list of the current medications will be sent to the facility for the patient to continue.  When discharged from the skilled rehab facility, please have the facility set up the patient's Home Health Physical Therapy prior to being released. Also, the skilled facility will be responsible for providing the patient with their medications at time of release from the facility to include their pain medication, the muscle relaxants, and their blood thinner medication. If the patient is still at the rehab facility at time of the two week follow up appointment, the skilled rehab facility will also need to assist the patient in arranging follow up appointment in our office and any transportation needs. ° °MAKE SURE YOU:  °• Understand these instructions.  °• Get help right away if you are not doing well or get worse.  ° ° °Pick up stool softner and laxative for home use following surgery while on pain medications. °Do not submerge incision under water. °Please use good hand washing techniques while changing dressing each day. °May shower starting three days after surgery. °Please use a clean towel to pat the incision dry following showers. °Continue to  use ice for pain and swelling after surgery. °Do not use any lotions or creams on the incision until instructed by your surgeon. ° °

## 2019-06-10 NOTE — Progress Notes (Addendum)
Assisted Dr. Lolita Lenz with right, ultrasound guided, adductor canal block. Side rails up, monitors on throughout procedure. See vital signs in flow sheet. Tolerated Procedure well.

## 2019-06-10 NOTE — Anesthesia Procedure Notes (Signed)
Anesthesia Regional Block: Adductor canal block   Pre-Anesthetic Checklist: ,, timeout performed, Correct Patient, Correct Site, Correct Laterality, Correct Procedure, Correct Position, site marked, Risks and benefits discussed,  Surgical consent,  Pre-op evaluation,  At surgeon's request and post-op pain management  Laterality: Right  Prep: chloraprep       Needles:  Injection technique: Single-shot  Needle Type: Echogenic Needle     Needle Length: 9cm  Needle Gauge: 21     Additional Needles:   Narrative:  Start time: 06/10/2019 7:32 AM End time: 06/10/2019 7:37 AM Injection made incrementally with aspirations every 5 mL.  Performed by: Personally  Anesthesiologist: Albertha Ghee, MD  Additional Notes: Pt tolerated the procedure well.

## 2019-06-10 NOTE — Op Note (Signed)
OPERATIVE REPORT-TOTAL KNEE ARTHROPLASTY   Pre-operative diagnosis- Osteoarthritis  Right knee(s)  Post-operative diagnosis- Osteoarthritis Right knee(s)  Procedure-  Right  Total Knee Arthroplasty  Surgeon- Dione Plover. Jame Seelig, MD  Assistant- Ardeen Jourdain, PA-C   Anesthesia-  Adductor canal block and spinal  EBL-20 mL   Drains Hemovac  Tourniquet time-  Total Tourniquet Time Documented: Thigh (Right) - 27 minutes Total: Thigh (Right) - 27 minutes     Complications- None  Condition-PACU - hemodynamically stable.   Brief Clinical Note  Marissa Hart is a 84 y.o. year old female with end stage OA of her right knee with progressively worsening pain and dysfunction. She has constant pain, with activity and at rest and significant functional deficits with difficulties even with ADLs. She has had extensive non-op management including analgesics, injections of cortisone and viscosupplements, and home exercise program, but remains in significant pain with significant dysfunction.Radiographs show bone on bone arthritis lateral and patellofemoral. She presents now for right Total Knee Arthroplasty.    Procedure in detail---   The patient is brought into the operating room and positioned supine on the operating table. After successful administration of  Adductor canal block and spinal,   a tourniquet is placed high on the  Right thigh(s) and the lower extremity is prepped and draped in the usual sterile fashion. Time out is performed by the operating team and then the  Right lower extremity is wrapped in Esmarch, knee flexed and the tourniquet inflated to 300 mmHg.       A midline incision is made with a ten blade through the subcutaneous tissue to the level of the extensor mechanism. A fresh blade is used to make a medial parapatellar arthrotomy. Soft tissue over the proximal medial tibia is subperiosteally elevated to the joint line with a knife and into the semimembranosus bursa with  a Cobb elevator. Soft tissue over the proximal lateral tibia is elevated with attention being paid to avoiding the patellar tendon on the tibial tubercle. The patella is everted, knee flexed 90 degrees and the ACL and PCL are removed. Findings are bone on bone lateral and patellofemoral with large global osteophytes.        The drill is used to create a starting hole in the distal femur and the canal is thoroughly irrigated with sterile saline to remove the fatty contents. The 5 degree Right  valgus alignment guide is placed into the femoral canal and the distal femoral cutting block is pinned to remove 10 mm off the distal femur. Resection is made with an oscillating saw.      The tibia is subluxed forward and the menisci are removed. The extramedullary alignment guide is placed referencing proximally at the medial aspect of the tibial tubercle and distally along the second metatarsal axis and tibial crest. The block is pinned to remove 31mm off the more deficient lateral  side. Resection is made with an oscillating saw. Size 2.5is the most appropriate size for the tibia and the proximal tibia is prepared with the modular drill and keel punch for that size.      The femoral sizing guide is placed and size 2.5 is most appropriate. Rotation is marked off the epicondylar axis and confirmed by creating a rectangular flexion gap at 90 degrees. The size 2.5 cutting block is pinned in this rotation and the anterior, posterior and chamfer cuts are made with the oscillating saw. The intercondylar block is then placed and that cut is made.  Trial size 2.5 tibial component, trial size 2.5 posterior stabilized femur and a 10  mm posterior stabilized rotating platform insert trial is placed. Full extension is achieved with excellent varus/valgus and anterior/posterior balance throughout full range of motion. The patella is everted and thickness measured to be 21  mm. Free hand resection is taken to 12 mm, a 32 template  is placed, lug holes are drilled, trial patella is placed, and it tracks normally. Osteophytes are removed off the posterior femur with the trial in place. All trials are removed and the cut bone surfaces prepared with pulsatile lavage. Cement is mixed and once ready for implantation, the size 2.5 tibial implant, size  2.5 posterior stabilized femoral component, and the size 32 patella are cemented in place and the patella is held with the clamp. The trial insert is placed and the knee held in full extension. The Exparel (20 ml mixed with 60 ml saline) is injected into the extensor mechanism, posterior capsule, medial and lateral gutters and subcutaneous tissues.  All extruded cement is removed and once the cement is hard the permanent 10 mm posterior stabilized rotating platform insert is placed into the tibial tray.      The wound is copiously irrigated with saline solution and the extensor mechanism closed over a hemovac drain with #1 V-loc suture. The tourniquet is released for a total tourniquet time of 27  minutes. Flexion against gravity is 140 degrees and the patella tracks normally. Subcutaneous tissue is closed with 2.0 vicryl and subcuticular with running 4.0 Monocryl. The incision is cleaned and dried and steri-strips and a bulky sterile dressing are applied. The limb is placed into a knee immobilizer and the patient is awakened and transported to recovery in stable condition.      Please note that a surgical assistant was a medical necessity for this procedure in order to perform it in a safe and expeditious manner. Surgical assistant was necessary to retract the ligaments and vital neurovascular structures to prevent injury to them and also necessary for proper positioning of the limb to allow for anatomic placement of the prosthesis.   Dione Plover Bellina Tokarczyk, MD    06/10/2019, 9:20 AM

## 2019-06-10 NOTE — Transfer of Care (Signed)
Immediate Anesthesia Transfer of Care Note  Patient: Marissa Hart  Procedure(s) Performed: TOTAL KNEE ARTHROPLASTY (Right Knee)  Patient Location: PACU  Anesthesia Type:Spinal  Level of Consciousness: awake, alert , oriented and patient cooperative  Airway & Oxygen Therapy: Patient Spontanous Breathing and Patient connected to face mask  Post-op Assessment: Report given to RN and Post -op Vital signs reviewed and stable  Post vital signs: Reviewed and stable  Last Vitals:  Vitals Value Taken Time  BP 93/56 06/10/19 0936  Temp    Pulse 62 06/10/19 0938  Resp 15 06/10/19 0938  SpO2 99 % 06/10/19 0938  Vitals shown include unvalidated device data.  Last Pain:  Vitals:   06/10/19 0749  TempSrc:   PainSc: 0-No pain         Complications: No apparent anesthesia complications

## 2019-06-10 NOTE — Anesthesia Procedure Notes (Signed)
Spinal  Patient location during procedure: OR Start time: 06/10/2019 8:16 AM End time: 06/10/2019 8:19 AM Staffing Anesthesiologist: Albertha Ghee, MD Performed: anesthesiologist  Preanesthetic Checklist Completed: patient identified, surgical consent, pre-op evaluation, timeout performed, IV checked, risks and benefits discussed and monitors and equipment checked Spinal Block Patient position: sitting Prep: DuraPrep Patient monitoring: cardiac monitor, continuous pulse ox and blood pressure Approach: midline Location: L3-4 Injection technique: single-shot Needle Needle type: Pencan  Needle gauge: 24 G Needle length: 9 cm Assessment Sensory level: T10 Additional Notes Functioning IV was confirmed and monitors were applied. Sterile prep and drape, including hand hygiene and sterile gloves were used. The patient was positioned and the spine was prepped. The skin was anesthetized with lidocaine.  Free flow of clear CSF was obtained prior to injecting local anesthetic into the CSF.  The spinal needle aspirated freely following injection.  The needle was carefully withdrawn.  The patient tolerated the procedure well.

## 2019-06-11 ENCOUNTER — Encounter (HOSPITAL_COMMUNITY): Payer: Self-pay | Admitting: Orthopedic Surgery

## 2019-06-11 LAB — CBC
HCT: 35.3 % — ABNORMAL LOW (ref 36.0–46.0)
Hemoglobin: 11.8 g/dL — ABNORMAL LOW (ref 12.0–15.0)
MCH: 33.5 pg (ref 26.0–34.0)
MCHC: 33.4 g/dL (ref 30.0–36.0)
MCV: 100.3 fL — ABNORMAL HIGH (ref 80.0–100.0)
Platelets: 224 10*3/uL (ref 150–400)
RBC: 3.52 MIL/uL — ABNORMAL LOW (ref 3.87–5.11)
RDW: 13 % (ref 11.5–15.5)
WBC: 11.6 10*3/uL — ABNORMAL HIGH (ref 4.0–10.5)
nRBC: 0 % (ref 0.0–0.2)

## 2019-06-11 LAB — BASIC METABOLIC PANEL
Anion gap: 8 (ref 5–15)
BUN: 7 mg/dL — ABNORMAL LOW (ref 8–23)
CO2: 26 mmol/L (ref 22–32)
Calcium: 8.6 mg/dL — ABNORMAL LOW (ref 8.9–10.3)
Chloride: 104 mmol/L (ref 98–111)
Creatinine, Ser: 0.67 mg/dL (ref 0.44–1.00)
GFR calc Af Amer: >60 mL/min (ref >60)
GFR calc non Af Amer: >60 mL/min (ref >60)
Glucose, Bld: 115 mg/dL — ABNORMAL HIGH (ref 70–99)
Potassium: 3.8 mmol/L (ref 3.5–5.1)
Sodium: 138 mmol/L (ref 135–145)

## 2019-06-11 MED ORDER — HYDROCODONE-ACETAMINOPHEN 5-325 MG PO TABS: 1.0000 | ORAL_TABLET | Freq: Four times a day (QID) | ORAL | 0 refills | Status: AC | PRN

## 2019-06-11 MED ORDER — GABAPENTIN 100 MG PO CAPS: 100.0000 mg | ORAL_CAPSULE | Freq: Three times a day (TID) | ORAL | 0 refills | Status: DC

## 2019-06-11 MED ORDER — METHOCARBAMOL 500 MG PO TABS: 500.0000 mg | ORAL_TABLET | Freq: Four times a day (QID) | ORAL | 0 refills | Status: DC | PRN

## 2019-06-11 NOTE — Progress Notes (Signed)
Physical Therapy Treatment Patient Details Name: Marissa Hart MRN: [id number] DOB: 04/21/1935 Today's Date: 06/11/2019    History of Present Illness s/p R TKA [id number], fibromyalgia    PT Comments    Pt progressing with gait but slightly fatigued. Will see again in pm and should be ready for d/c after next session  Follow Up Recommendations  Follow surgeon's recommendation for DC plan and follow-up therapies     Equipment Recommendations  None recommended by PT    Recommendations for Other Services       Precautions / Restrictions Precautions Precautions: Knee;Fall Required Braces or Orthoses: Knee Immobilizer - Right Knee Immobilizer - Right: Discontinue once straight leg raise with < 10 degree lag Restrictions Weight Bearing Restrictions: No Other Position/Activity Restrictions: WBAT    Mobility  Bed Mobility Overal bed mobility: Needs Assistance       Supine to sit: Min assist     General bed mobility comments: assist with RLE  Transfers Overall transfer level: Needs assistance Equipment used: Rolling walker (2 wheeled) Transfers: Sit to/from Stand Sit to Stand: Min assist         General transfer comment: cues for hand and RLE position  Ambulation/Gait Ambulation/Gait assistance: Min guard;Min assist Gait Distance (Feet): 70 Feet Assistive device: Rolling walker (2 wheeled) Gait Pattern/deviations: Step-to pattern;Decreased weight shift to right     General Gait Details: cues for sequence and RW position   Stairs             Wheelchair Mobility    Modified Rankin (Stroke Patients Only)       Balance                                            Cognition Arousal/Alertness: Awake/alert Behavior During Therapy: WFL for tasks assessed/performed Overall Cognitive Status: Within Functional Limits for tasks assessed                                        Exercises      General  Comments        Pertinent Vitals/Pain Pain Assessment: 0-10 Pain Score: 4  Pain Location: right knee Pain Descriptors / Indicators: Grimacing;Sore Pain Intervention(s): Limited activity within patient's tolerance;Monitored during session;Ice applied;Premedicated before session;Repositioned    Home Living                      Prior Function            PT Goals (current goals can now be found in the care plan section) Acute Rehab PT Goals Patient Stated Goal: home tomorrow PT Goal Formulation: With patient Time For Goal Achievement: 06/17/19 Potential to Achieve Goals: Good Progress towards PT goals: Progressing toward goals    Frequency    7X/week      PT Plan Current plan remains appropriate    Co-evaluation              AM-PAC PT "6 Clicks" Mobility   Outcome Measure  Help needed turning from your back to your side while in a flat bed without using bedrails?: A Little Help needed moving from lying on your back to sitting on the side of a flat bed without using bedrails?: A Little Help needed moving to and from a  bed to a chair (including a wheelchair)?: A Little Help needed standing up from a chair using your arms (e.g., wheelchair or bedside chair)?: A Little Help needed to walk in hospital room?: A Little Help needed climbing 3-5 steps with a railing? : A Lot 6 Click Score: 17    End of Session Equipment Utilized During Treatment: Gait belt;Right knee immobilizer Activity Tolerance: Patient tolerated treatment well Patient left: in chair;with call bell/phone within reach;with chair alarm set   PT Visit Diagnosis: Difficulty in walking, not elsewhere classified (R26.2)     Time: 1050-1108 PT Time Calculation (min) (ACUTE ONLY): 18 min  Charges:  $Gait Training: 8-22 mins                     Kenyon Ana, PT  Pager: [phone] Acute Rehab Dept [facility]): [phone]   06/11/2019    [facility] 06/11/2019, 1:19 PM

## 2019-06-11 NOTE — Progress Notes (Signed)
Subjective: 1 Day Post-Op Procedure(s) (LRB): TOTAL KNEE ARTHROPLASTY (Right) Patient reports pain as mild.   Patient seen in rounds by Dr. Wynelle Link. Patient is well, and has had no acute complaints or problems other than discomfort in the right knee. No acute events overnight. Foley catheter removed. Patient states she is ready to go home today.  We will continue therapy today.   Objective: Vital signs in last 24 hours: Temp:  [97.4 F (36.3 C)-99.1 F (37.3 C)] 99 F (37.2 C) (09/22 0523) Pulse Rate:  [60-82] 70 (09/22 0523) Resp:  [9-17] 16 (09/22 0523) BP: (93-170)/(52-93) 147/66 (09/22 0523) SpO2:  [96 %-100 %] 100 % (09/22 0523)  Intake/Output from previous day:  Intake/Output Summary (Last 24 hours) at 06/11/2019 0710 Last data filed at 06/11/2019 0703 Gross per 24 hour  Intake 2924.2 ml  Output 2736 ml  Net 188.2 ml     Intake/Output this shift: Total I/O In: -  Out: 300 [Urine:300]  Labs: Recent Labs    06/11/19 0227  HGB 11.8*   Recent Labs    06/11/19 0227  WBC 11.6*  RBC 3.52*  HCT 35.3*  PLT 224   Recent Labs    06/11/19 0227  NA 138  K 3.8  CL 104  CO2 26  BUN 7*  CREATININE 0.67  GLUCOSE 115*  CALCIUM 8.6*   No results for input(s): LABPT, INR in the last 72 hours.  Exam: General - Patient is Alert and Oriented Extremity - Neurologically intact Sensation intact distally Intact pulses distally Dorsiflexion/Plantar flexion intact Dressing - dressing C/D/I Motor Function - intact, moving foot and toes well on exam.   Past Medical History:  Diagnosis Date  . Acquired renal cyst of right kidney   . Anxiety   . Atrial fibrillation and flutter ([place])    s/p PVI and CTI by Dr Rayann Heman 04/2013  . Blood transfusion   . Cataract   . CHEST WALL PAIN, HX OF 07/28/2007   Qualifier: Diagnosis of  By: Julien Girt CMA, [name]    . Diverticulosis of colon   . DJD (degenerative joint disease) 10/18/2011  . Erysipelas   . Fibromyalgia   .  FIBROMYALGIA 07/28/2007   Qualifier: Diagnosis of  By: Julien Girt CMA, [name]    . GERD (gastroesophageal reflux disease)   . Hiatal hernia   . Hot flashes - on low dose HRT with gynecologist, Dr. Nori Riis 03/31/2014  . Hx of colonic polyps   . Hyperlipidemia   . MITRAL VALVE PROLAPSE 07/28/2007   Qualifier: Diagnosis of  By: Julien Girt CMA, [name]    . PONV (postoperative nausea and vomiting)   . Supraventricular tachycardia ([place])    AV nodal reentry  s/p RFCA 2001  . Thyroid disease     Assessment/Plan: 1 Day Post-Op Procedure(s) (LRB): TOTAL KNEE ARTHROPLASTY (Right) Principal Problem:   OA (osteoarthritis) of knee  Estimated body mass index is 26.44 kg/m as calculated from the following:   Height as of this encounter: 5\' 2"  (1.575 m).   Weight as of this encounter: 65.6 kg. Advance diet Up with therapy D/C IV fluids   Patient's anticipated LOS is less than 2 midnights, meeting these requirements: - Lives within 1 hour of care - Has a competent adult at home to recover with post-op recover - NO history of  - Chronic pain requiring opiods  - Diabetes  - Coronary Artery Disease  - Heart failure  - Heart attack  - Stroke  - DVT/VTE  -  Respiratory Failure/COPD  - Renal failure  - Anemia  - Advanced Liver disease  DVT Prophylaxis - Aspirin and edoxaban Weight bearing as tolerated. D/C O2 and pulse ox and try on room air. Hemovac pulled without difficulty, will begin therapy today.  Plan is to go Home after hospital stay. Plan for discharge today following 1-2 sessions of PT as long as she is meeting her goals. She was able to ambulate 60 feet with PT yesterday. She is scheduled for OPPT at [facility]. Follow up in the office in 2 weeks.   Griffith Citron, PA-C Orthopedic Surgery 06/11/2019, 7:10 AM

## 2019-06-11 NOTE — Progress Notes (Signed)
Therapy Plan: OPPT@ [facility].  DME: Has RW.

## 2019-06-11 NOTE — Progress Notes (Signed)
   06/11/19 1400  PT Visit Information  Last PT Received On 06/11/19  Pt progressing well this pm, reviewed  HEP, gait, home safety; pt declined to practice 2 steps stating she would "walk around them" as she was doing prior to surgery. ready for d/c from PT standpoint  Assistance Needed +1  History of Present Illness s/p R TKA [id number], fibromyalgia  Subjective Data  Patient Stated Goal home tomorrow  Precautions  Precautions Knee;Fall  Required Braces or Orthoses Knee Immobilizer - Right  Knee Immobilizer - Right Discontinue once straight leg raise with < 10 degree lag  Restrictions  Weight Bearing Restrictions No  Other Position/Activity Restrictions WBAT  Pain Assessment  Pain Assessment 0-10  Pain Score 3  Pain Location right knee  Pain Descriptors / Indicators Grimacing;Sore  Pain Intervention(s) Limited activity within patient's tolerance;Monitored during session;Premedicated before session;Repositioned;Ice applied  Cognition  Arousal/Alertness Awake/alert  Behavior During Therapy WFL for tasks assessed/performed  Overall Cognitive Status Within Functional Limits for tasks assessed  Bed Mobility  General bed mobility comments in chair   Transfers  Overall transfer level Needs assistance  Equipment used Rolling walker (2 wheeled)  Transfers Sit to/from Stand  Sit to Stand Min guard  General transfer comment cues for hand and RLE position  Ambulation/Gait  Ambulation/Gait assistance Min guard;Supervision  Gait Distance (Feet) 100 Feet  Assistive device Rolling walker (2 wheeled)  Gait Pattern/deviations Step-to pattern;Decreased weight shift to right  General Gait Details cues for sequence and RW position  Total Joint Exercises  Ankle Circles/Pumps AROM;Both;10 reps  Quad Sets AROM;Both;10 reps  Short Arc Quad AROM;Right;10 reps  Heel Slides AROM;AAROM;Right;10 reps  Hip ABduction/ADduction AROM;AAROM;Right;10 reps  Straight Leg Raises AROM;AAROM;Right;10  reps  Goniometric ROM grossly 8 to 60 degrees right knee flexion  PT - End of Session  Equipment Utilized During Treatment Gait belt;Right knee immobilizer  Activity Tolerance Patient tolerated treatment well  Patient left in chair;with call bell/phone within reach;with chair alarm set   PT - Assessment/Plan  PT Plan Current plan remains appropriate  PT Visit Diagnosis Difficulty in walking, not elsewhere classified (R26.2)  PT Frequency (ACUTE ONLY) 7X/week  Follow Up Recommendations Follow surgeon's recommendation for DC plan and follow-up therapies  PT equipment None recommended by PT  AM-PAC PT "6 Clicks" Mobility Outcome Measure (Version 2)  Help needed turning from your back to your side while in a flat bed without using bedrails? 3  Help needed moving from lying on your back to sitting on the side of a flat bed without using bedrails? 3  Help needed moving to and from a bed to a chair (including a wheelchair)? 3  Help needed standing up from a chair using your arms (e.g., wheelchair or bedside chair)? 3  Help needed to walk in hospital room? 3  Help needed climbing 3-5 steps with a railing?  2  6 Click Score 17  Consider Recommendation of Discharge To: Home with [facility]  PT Goal Progression  Progress towards PT goals Progressing toward goals  Acute Rehab PT Goals  PT Goal Formulation With patient  Time For Goal Achievement 06/17/19  Potential to Achieve Goals Good  PT Time Calculation  PT Start Time (ACUTE ONLY) 1357  PT Stop Time (ACUTE ONLY) 1427  PT Time Calculation (min) (ACUTE ONLY) 30 min  PT General Charges  $$ ACUTE PT VISIT 1 Visit  PT Treatments  $Gait Training 8-22 mins  $Therapeutic Exercise 8-22 mins

## 2019-06-11 NOTE — Plan of Care (Signed)
Continue current POC 

## 2019-06-12 ENCOUNTER — Encounter (HOSPITAL_COMMUNITY): Payer: Self-pay | Admitting: Orthopedic Surgery

## 2019-06-12 NOTE — Discharge Summary (Signed)
Physician Discharge Summary   Patient ID: Marissa Hart MRN: [id number] DOB/AGE: 1935-07-30 84 y.o.  Admit date: 06/10/2019 Discharge date: 06/11/2019  Primary Diagnosis: Osteoarthritis Right knee(s)  Admission Diagnoses:  Past Medical History:  Diagnosis Date   Acquired renal cyst of right kidney    Anxiety    Atrial fibrillation and flutter ([place])    s/p PVI and CTI by Dr Rayann Heman 04/2013   Blood transfusion    Cataract    CHEST WALL PAIN, HX OF 07/28/2007   Qualifier: Diagnosis of  By: Julien Girt CMA, [name]     Diverticulosis of colon    DJD (degenerative joint disease) 10/18/2011   Erysipelas    Fibromyalgia    FIBROMYALGIA 07/28/2007   Qualifier: Diagnosis of  By: Julien Girt CMA, [name]     GERD (gastroesophageal reflux disease)    Hiatal hernia    Hot flashes - on low dose HRT with gynecologist, Dr. Nori Riis 03/31/2014   Hx of colonic polyps    Hyperlipidemia    MITRAL VALVE PROLAPSE 07/28/2007   Qualifier: Diagnosis of  By: Julien Girt CMA, [name]     PONV (postoperative nausea and vomiting)    Supraventricular tachycardia ([place])    AV nodal reentry  s/p RFCA 2001   Thyroid disease    Discharge Diagnoses:   Principal Problem:   OA (osteoarthritis) of knee  Estimated body mass index is 26.44 kg/m as calculated from the following:   Height as of this encounter: 5\' 2"  (1.575 m).   Weight as of this encounter: 65.6 kg.  Procedure:  Procedure(s) (LRB): TOTAL KNEE ARTHROPLASTY (Right)   Consults: None  HPI: Marissa Hart is a 84 y.o. year old female with end stage OA of her right knee with progressively worsening pain and dysfunction. She has constant pain, with activity and at rest and significant functional deficits with difficulties even with ADLs. She has had extensive non-op management including analgesics, injections of cortisone and viscosupplements, and home exercise program, but remains in significant pain with significant dysfunction.Radiographs show bone  on bone arthritis lateral and patellofemoral. She presents now for right Total Knee Arthroplasty.    Laboratory Data: Admission on 06/10/2019, Discharged on 06/11/2019  Component Date Value Ref Range Status   WBC 06/11/2019 11.6* 4.0 - 10.5 K/uL Final   RBC 06/11/2019 3.52* 3.87 - 5.11 MIL/uL Final   Hemoglobin 06/11/2019 11.8* 12.0 - 15.0 g/dL Final   HCT 06/11/2019 35.3* 36.0 - 46.0 % Final   MCV 06/11/2019 100.3* 80.0 - 100.0 fL Final   MCH 06/11/2019 33.5  26.0 - 34.0 pg Final   MCHC 06/11/2019 33.4  30.0 - 36.0 g/dL Final   RDW 06/11/2019 13.0  11.5 - 15.5 % Final   Platelets 06/11/2019 224  150 - 400 K/uL Final   nRBC 06/11/2019 0.0  0.0 - 0.2 % Final   Performed at [facility], [place] [address] [place], [place] [address]   Sodium 06/11/2019 138  135 - 145 mmol/L Final   Potassium 06/11/2019 3.8  3.5 - 5.1 mmol/L Final   Chloride 06/11/2019 104  98 - 111 mmol/L Final   CO2 06/11/2019 26  22 - 32 mmol/L Final   Glucose, Bld 06/11/2019 115* 70 - 99 mg/dL Final   BUN 06/11/2019 7* 8 - 23 mg/dL Final   Creatinine, Ser 06/11/2019 0.67  0.44 - 1.00 mg/dL Final   Calcium 06/11/2019 8.6* 8.9 - 10.3 mg/dL Final   GFR calc non Af Amer 06/11/2019 >60  >  60 mL/min Final   GFR calc Af Amer 06/11/2019 >60  >60 mL/min Final   Anion gap 06/11/2019 8  5 - 15 Final   Performed at [facility], [place] [address], [place], [place] [address]  Appointment on 06/10/2019  Component Date Value Ref Range Status   Date Time Interrogation Session 06/08/2019 [id number]   Final   Pulse Generator Manufacturer 06/08/2019 MERM   Final   Pulse Gen Model 06/08/2019 LNQ11 Reveal LINQ   Final   Pulse Gen Serial Number 06/08/2019 [id number]   Final   Clinic Name 06/08/2019 [facility]   Final   Implantable Pulse Generator Type 06/08/2019 ICM/ILR   Final   Implantable Pulse Generator Implan* 06/08/2019 [id number]   Final  Hospital Outpatient  Visit on 06/06/2019  Component Date Value Ref Range Status   SARS-CoV-2, NAA 06/06/2019 NOT DETECTED  NOT DETECTED Final   Comment: (NOTE) This nucleic acid amplification test was developed and its performance characteristics determined by [employer]. Nucleic acid amplification tests include PCR and TMA. This test has not been FDA cleared or approved. This test has been authorized by FDA under an Emergency Use Authorization (EUA). This test is only authorized for the duration of time the declaration that circumstances exist justifying the authorization of the emergency use of in vitro diagnostic tests for detection of SARS-CoV-2 virus and/or diagnosis of COVID-19 infection under section 564(b)(1) of the Act, 21 U.S.C. [id number]) (1), unless the authorization is terminated or revoked sooner. When diagnostic testing is negative, the possibility of a false negative result should be considered in the context of a patient's recent exposures and the presence of clinical signs and symptoms consistent with COVID-19. An individual without symptoms of COVID- 19 and who is not shedding SARS-CoV-2 vi                          rus would expect to have a negative (not detected) result in this assay. Performed At: [facility] [place], [place] [id number] Rush Farmer MD [id number]    Coronavirus Source 06/06/2019 NASOPHARYNGEAL   Final   Performed at [place] [address] [place],  [address]  Hospital Outpatient Visit on 06/05/2019  Component Date Value Ref Range Status   MRSA, PCR 06/05/2019 POSITIVE* NEGATIVE Final   Comment: RESULT CALLED TO, READ BACK BY AND VERIFIED WITH: S.SHOFFNER AT 1545 ON 06/07/19 BY N.THOMPSON    Staphylococcus aureus 06/05/2019 POSITIVE* NEGATIVE Final   Comment: (NOTE) The Xpert SA Assay (FDA approved for NASAL specimens in patients 29 years of age and older), is one component of a  comprehensive surveillance program. It is not intended to diagnose infection nor to guide or monitor treatment. Performed at [facility], [place] [address] [place], [place] [address]    aPTT 06/05/2019 34  24 - 36 seconds Final   Performed at [facility], [place] [address], [place], [place] [address]   WBC 06/05/2019 7.3  4.0 - 10.5 K/uL Final   RBC 06/05/2019 4.24  3.87 - 5.11 MIL/uL Final   Hemoglobin 06/05/2019 13.9  12.0 - 15.0 g/dL Final   HCT 06/05/2019 41.7  36.0 - 46.0 % Final   MCV 06/05/2019 98.3  80.0 - 100.0 fL Final   MCH 06/05/2019 32.8  26.0 - 34.0 pg Final   MCHC 06/05/2019 33.3  30.0 - 36.0 g/dL Final   RDW 06/05/2019 12.8  11.5 -  15.5 % Final   Platelets 06/05/2019 246  150 - 400 K/uL Final   nRBC 06/05/2019 0.0  0.0 - 0.2 % Final   Performed at [facility], [place] [address] [place], [place] [address]   Sodium 06/05/2019 136  135 - 145 mmol/L Final   Potassium 06/05/2019 3.9  3.5 - 5.1 mmol/L Final   Chloride 06/05/2019 101  98 - 111 mmol/L Final   CO2 06/05/2019 27  22 - 32 mmol/L Final   Glucose, Bld 06/05/2019 88  70 - 99 mg/dL Final   BUN 06/05/2019 11  8 - 23 mg/dL Final   Creatinine, Ser 06/05/2019 0.64  0.44 - 1.00 mg/dL Final   Calcium 06/05/2019 9.0  8.9 - 10.3 mg/dL Final   Total Protein 06/05/2019 6.5  6.5 - 8.1 g/dL Final   Albumin 06/05/2019 3.9  3.5 - 5.0 g/dL Final   AST 06/05/2019 20  15 - 41 U/L Final   ALT 06/05/2019 16  0 - 44 U/L Final   Alkaline Phosphatase 06/05/2019 99  38 - 126 U/L Final   Total Bilirubin 06/05/2019 0.4  0.3 - 1.2 mg/dL Final   GFR calc non Af Amer 06/05/2019 >60  >60 mL/min Final   GFR calc Af Amer 06/05/2019 >60  >60 mL/min Final   Anion gap 06/05/2019 8  5 - 15 Final   Performed at [facility], [place] [address], [place], [place] [address]   Prothrombin Time 06/05/2019 13.7  11.4 - 15.2 seconds Final   INR 06/05/2019  1.1  0.8 - 1.2 Final   Comment: (NOTE) INR goal varies based on device and disease states. Performed at [facility], [place] [address] [place], [place] [address]    ABO/RH(D) 06/05/2019 O POS   Final   Antibody Screen 06/05/2019 NEG   Final   Sample Expiration 06/05/2019 06/13/2019,2359   Final   Extend sample reason 06/05/2019    Final                   Value:NO TRANSFUSIONS OR PREGNANCY IN THE PAST 3 MONTHS Performed at [place] [address], [place], [place] [address]    ABO/RH(D) 06/05/2019    Final                   Value:O POS Performed at [facility], [place] [address] [place], [place] [address]   Office Visit on 05/22/2019  Component Date Value Ref Range Status   TSH 05/22/2019 0.62  0.35 - 4.50 uIU/mL Final  Clinical Support on 05/06/2019  Component Date Value Ref Range Status   Date Time Interrogation Session 05/06/2019 [id number]   Final   Pulse Generator Manufacturer 05/06/2019 MERM   Final   Pulse Gen Model 05/06/2019 LNQ11 Reveal LINQ   Final   Pulse Gen Serial Number 05/06/2019 [id number]   Final   Clinic Name 05/06/2019 [facility]   Final   Implantable Pulse Generator Type 05/06/2019 ICM/ILR   Final   Implantable Pulse Generator Implan* 05/06/2019 [id number]   Final  Abstract on 05/02/2019  Component Date Value Ref Range Status   HM Mammogram 03/18/2019 0-4 Bi-Rad  0-4 Bi-Rad, Self Reported Normal Final   [facility]-Bi-rads 1: negative     X-Rays:No results found.  EKG: Orders placed or performed during the hospital encounter of 06/05/19   EKG 12 lead   EKG 12 lead     Hospital Course: CHARISSE AUDELO is  a 84 y.o. who was admitted to [facility]. They were brought to the operating room on 06/10/2019 and underwent Procedure(s): TOTAL KNEE ARTHROPLASTY.  Patient tolerated the procedure well and was later transferred to the recovery room and then to the orthopaedic floor  for postoperative care. They were given PO and IV analgesics for pain control following their surgery. They were given 24 hours of postoperative antibiotics of  Anti-infectives (From admission, onward)   Start     Dose/Rate Route Frequency Ordered Stop   06/10/19 1430  ceFAZolin (ANCEF) IVPB 1 g/50 mL premix     1 g 100 mL/hr over 30 Minutes Intravenous Every 6 hours 06/10/19 1138 06/10/19 2115   06/10/19 0630  ceFAZolin (ANCEF) IVPB 2g/100 mL premix     2 g 200 mL/hr over 30 Minutes Intravenous On call to O.R. 06/10/19 0617 06/10/19 0840   06/10/19 0617  vancomycin (VANCOCIN) IVPB 1000 mg/200 mL premix     1,000 mg 200 mL/hr over 60 Minutes Intravenous 60 min pre-op 06/10/19 0617 06/10/19 0828     and started on DVT prophylaxis in the form of Aspirin and edoxaban.   PT and OT were ordered for total joint protocol. Discharge planning consulted to help with postop disposition and equipment needs.  Patient had a good night on the evening of surgery. They started to get up OOB with therapy on POD #0 and ambulated 60 feet. Pt was seen during rounds and was ready to go home pending progress with therapy. Hemovac drain was pulled without difficulty. She worked with therapy on POD #1 and was meeting her goals. Pt was discharged to home later that day in stable condition.  Diet: Regular diet Activity: WBAT Follow-up: in 2 weeks Disposition: Home Discharged Condition: good   Discharge Instructions    Call MD / Call 911   Complete by: As directed    If you experience chest pain or shortness of breath, CALL 911 and be transported to the hospital emergency room.  If you develope a fever above 101 F, pus (white drainage) or increased drainage or redness at the wound, or calf pain, call your surgeon's office.   Change dressing   Complete by: As directed    Change dressing on Wednesday, then change the dressing daily with sterile 4 x 4 inch gauze dressing and apply TED hose.   Constipation Prevention    Complete by: As directed    Drink plenty of fluids.  Prune juice may be helpful.  You may use a stool softener, such as Colace (over the counter) 100 mg twice a day.  Use MiraLax (over the counter) for constipation as needed.   Diet - low sodium heart healthy   Complete by: As directed    Discharge instructions   Complete by: As directed    Dr. Gaynelle Arabian Total Joint Specialist [facility] [address], [place], [place] [address] [phone]  TOTAL KNEE REPLACEMENT POSTOPERATIVE DIRECTIONS  Knee Rehabilitation, Guidelines Following Surgery  Results after knee surgery are often greatly improved when you follow the exercise, range of motion and muscle strengthening exercises prescribed by your doctor. Safety measures are also important to protect the knee from further injury. Any time any of these exercises cause you to have increased pain or swelling in your knee joint, decrease the amount until you are comfortable again and slowly increase them. If you have problems or questions, call your caregiver or physical therapist for advice.   HOME CARE  INSTRUCTIONS  Remove items at home which could result in a fall. This includes throw rugs or furniture in walking pathways.  ICE to the affected knee every three hours for 30 minutes at a time and then as needed for pain and swelling.  Continue to use ice on the knee for pain and swelling from surgery. You may notice swelling that will progress down to the foot and ankle.  This is normal after surgery.  Elevate the leg when you are not up walking on it.   Continue to use the breathing machine which will help keep your temperature down.  It is common for your temperature to cycle up and down following surgery, especially at night when you are not up moving around and exerting yourself.  The breathing machine keeps your lungs expanded and your temperature down. Do not place pillow under knee, focus on keeping the knee straight while  resting   DIET You may resume your previous home diet once your are discharged from the hospital.  DRESSING / WOUND CARE / SHOWERING You may change your dressing 3-5 days after surgery.  Then change the dressing every day with sterile gauze.  Please use good hand washing techniques before changing the dressing.  Do not use any lotions or creams on the incision until instructed by your surgeon. You may start showering once you are discharged home but do not submerge the incision under water. Just pat the incision dry and apply a dry gauze dressing on daily. Change the surgical dressing daily and reapply a dry dressing each time.  ACTIVITY Walk with your walker as instructed. Use walker as long as suggested by your caregivers. Avoid periods of inactivity such as sitting longer than an hour when not asleep. This helps prevent blood clots.  You may resume a sexual relationship in one month or when given the OK by your doctor.  You may return to work once you are cleared by your doctor.  Do not drive a car for 6 weeks or until released by you surgeon.  Do not drive while taking narcotics.  WEIGHT BEARING Weight bearing as tolerated with assist device (walker, cane, etc) as directed, use it as long as suggested by your surgeon or therapist, typically at least 4-6 weeks.  POSTOPERATIVE CONSTIPATION PROTOCOL Constipation - defined medically as fewer than three stools per week and severe constipation as less than one stool per week.  One of the most common issues patients have following surgery is constipation.  Even if you have a regular bowel pattern at home, your normal regimen is likely to be disrupted due to multiple reasons following surgery.  Combination of anesthesia, postoperative narcotics, change in appetite and fluid intake all can affect your bowels.  In order to avoid complications following surgery, here are some recommendations in order to help you during your recovery  period.  Colace (docusate) - Pick up an over-the-counter form of Colace or another stool softener and take twice a day as long as you are requiring postoperative pain medications.  Take with a full glass of water daily.  If you experience loose stools or diarrhea, hold the colace until you stool forms back up.  If your symptoms do not get better within 1 week or if they get worse, check with your doctor.  Dulcolax (bisacodyl) - Pick up over-the-counter and take as directed by the product packaging as needed to assist with the movement of your bowels.  Take with a full glass of water.  Use this product as needed if not relieved by Colace only.   MiraLax (polyethylene glycol) - Pick up over-the-counter to have on hand.  MiraLax is a solution that will increase the amount of water in your bowels to assist with bowel movements.  Take as directed and can mix with a glass of water, juice, soda, coffee, or tea.  Take if you go more than two days without a movement. Do not use MiraLax more than once per day. Call your doctor if you are still constipated or irregular after using this medication for 7 days in a row.  If you continue to have problems with postoperative constipation, please contact the office for further assistance and recommendations.  If you experience "the worst abdominal pain ever" or develop nausea or vomiting, please contact the office immediatly for further recommendations for treatment.  ITCHING  If you experience itching with your medications, try taking only a single pain pill, or even half a pain pill at a time.  You can also use Benadryl over the counter for itching or also to help with sleep.   TED HOSE STOCKINGS Wear the elastic stockings on both legs for three weeks following surgery during the day but you may remove then at night for sleeping.  MEDICATIONS See your medication summary on the "After Visit Summary" that the nursing staff will review with you prior to discharge.   You may have some home medications which will be placed on hold until you complete the course of blood thinner medication.  It is important for you to complete the blood thinner medication as prescribed by your surgeon.  Continue your approved medications as instructed at time of discharge.  PRECAUTIONS If you experience chest pain or shortness of breath - call 911 immediately for transfer to the hospital emergency department.  If you develop a fever greater that 101 F, purulent drainage from wound, increased redness or drainage from wound, foul odor from the wound/dressing, or calf pain - CONTACT YOUR SURGEON.                                                   FOLLOW-UP APPOINTMENTS Make sure you keep all of your appointments after your operation with your surgeon and caregivers. You should call the office at the above phone number and make an appointment for approximately two weeks after the date of your surgery or on the date instructed by your surgeon outlined in the "After Visit Summary".   RANGE OF MOTION AND STRENGTHENING EXERCISES  Rehabilitation of the knee is important following a knee injury or an operation. After just a few days of immobilization, the muscles of the thigh which control the knee become weakened and shrink (atrophy). Knee exercises are designed to build up the tone and strength of the thigh muscles and to improve knee motion. Often times heat used for twenty to thirty minutes before working out will loosen up your tissues and help with improving the range of motion but do not use heat for the first two weeks following surgery. These exercises can be done on a training (exercise) mat, on the floor, on a table or on a bed. Use what ever works the best and is most comfortable for you Knee exercises include:  Leg Lifts - While your knee is still immobilized in a splint or cast,  you can do straight leg raises. Lift the leg to 60 degrees, hold for 3 sec, and slowly lower the leg.  Repeat 10-20 times 2-3 times daily. Perform this exercise against resistance later as your knee gets better.  Quad and Hamstring Sets - Tighten up the muscle on the front of the thigh (Quad) and hold for 5-10 sec. Repeat this 10-20 times hourly. Hamstring sets are done by pushing the foot backward against an object and holding for 5-10 sec. Repeat as with quad sets.  Leg Slides: Lying on your back, slowly slide your foot toward your buttocks, bending your knee up off the floor (only go as far as is comfortable). Then slowly slide your foot back down until your leg is flat on the floor again. Angel Wings: Lying on your back spread your legs to the side as far apart as you can without causing discomfort.  A rehabilitation program following serious knee injuries can speed recovery and prevent re-injury in the future due to weakened muscles. Contact your doctor or a physical therapist for more information on knee rehabilitation.   IF YOU ARE TRANSFERRED TO A SKILLED REHAB FACILITY If the patient is transferred to a skilled rehab facility following release from the hospital, a list of the current medications will be sent to the facility for the patient to continue.  When discharged from the skilled rehab facility, please have the facility set up the patient's [place] prior to being released. Also, the skilled facility will be responsible for providing the patient with their medications at time of release from the facility to include their pain medication, the muscle relaxants, and their blood thinner medication. If the patient is still at the rehab facility at time of the two week follow up appointment, the skilled rehab facility will also need to assist the patient in arranging follow up appointment in our office and any transportation needs.  MAKE SURE YOU:  Understand these instructions.  Get help right away if you are not doing well or get worse.    Pick up stool softner and  laxative for home use following surgery while on pain medications. Do not submerge incision under water. Please use good hand washing techniques while changing dressing each day. May shower starting three days after surgery. Please use a clean towel to pat the incision dry following showers. Continue to use ice for pain and swelling after surgery. Do not use any lotions or creams on the incision until instructed by your surgeon.   Do not put a pillow under the knee. Place it under the heel.   Complete by: As directed    Driving restrictions   Complete by: As directed    No driving for two weeks   TED hose   Complete by: As directed    Use stockings (TED hose) for three weeks on both leg(s).  You may remove them at night for sleeping.   Weight bearing as tolerated   Complete by: As directed      Allergies as of 06/11/2019      Reactions   Propafenone Other (See Comments)   BP too low   Codeine Other (See Comments)   REACTION: nausea/dizziness   Tramadol Hcl Other (See Comments)   REACTION: nausea--dizzy      Medication List    STOP taking these medications   acetaminophen 500 MG tablet Commonly known as: TYLENOL     TAKE these medications   ARTIFICIAL TEARS OP Apply 1  drop to eye daily as needed (dry eyes).   aspirin 325 MG tablet Take 325 mg by mouth daily as needed (knee piain).   B-12 5000 MCG Subl Place 5,000 mcg under the tongue daily.   docusate sodium 100 MG capsule Commonly known as: COLACE Take 100 mg by mouth daily as needed for mild constipation.   estradiol 0.1 MG/24HR patch Commonly known as: VIVELLE-DOT Place 0.25 patches onto the skin 2 (two) times a week. Wed and Sat   gabapentin 100 MG capsule Commonly known as: NEURONTIN Take 1 capsule (100 mg total) by mouth 3 (three) times daily. Take a 100 mg capsule three times a day for two weeks following surgery.Then take a 100 mg capsule two times a day for two weeks. Then take a 100 mg capsule once a  day for two weeks. Then discontinue the Gabapentin.   HYDROcodone-acetaminophen 5-325 MG tablet Commonly known as: NORCO/VICODIN Take 1-2 tablets by mouth every 6 (six) hours as needed for up to 7 days for moderate pain.   levothyroxine 88 MCG tablet Commonly known as: SYNTHROID TAKE 88 MCG BY MOUTH 3 DAYS PER WEEK AND TAKE 75 MCG ON ALL OTHER DAYS What changed: See the new instructions.   methocarbamol 500 MG tablet Commonly known as: ROBAXIN Take 1 tablet (500 mg total) by mouth every 6 (six) hours as needed for muscle spasms.   psyllium 58.6 % powder Commonly known as: METAMUCIL Take 1 packet by mouth daily as needed (fiber).   Savaysa 60 MG Tabs tablet Generic drug: edoxaban Take 1 tablet by mouth once daily   verapamil 120 MG CR tablet Commonly known as: CALAN-SR Take 1 tablet by mouth once daily   VITAMIN D (CHOLECALCIFEROL) PO Take 4,000 Units by mouth daily.            Discharge Care Instructions  (From admission, onward)         Start     Ordered   06/11/19 0000  Weight bearing as tolerated     06/11/19 0725   06/11/19 0000  Change dressing    Comments: Change dressing on Wednesday, then change the dressing daily with sterile 4 x 4 inch gauze dressing and apply TED hose.   06/11/19 0725         Follow-up Information    Gaynelle Arabian, MD. Schedule an appointment as soon as possible for a visit on 06/25/2019.   Specialty: Orthopedic Surgery Contact information: [address] [place] [address] [id number]           Signed: Griffith Citron, PA-C Orthopedic Surgery 06/12/2019, 7:13 AM

## 2019-06-12 NOTE — Anesthesia Postprocedure Evaluation (Signed)
Anesthesia Post Note  Patient: Marissa Hart  Procedure(s) Performed: TOTAL KNEE ARTHROPLASTY (Right Knee)     Patient location during evaluation: PACU Anesthesia Type: MAC and Spinal Level of consciousness: oriented and awake and alert Pain management: pain level controlled Vital Signs Assessment: post-procedure vital signs reviewed and stable Respiratory status: spontaneous breathing, respiratory function stable and patient connected to nasal cannula oxygen Cardiovascular status: blood pressure returned to baseline and stable Postop Assessment: no headache, no backache and no apparent nausea or vomiting Anesthetic complications: no    Last Vitals:  Vitals:   06/11/19 1009 06/11/19 1356  BP: (!) 169/70 (!) 172/77  Pulse: 72 75  Resp: 16 15  Temp: 37 C 36.7 C  SpO2: 100% 99%    Last Pain:  Vitals:   06/11/19 1510  TempSrc:   PainSc: [place]

## 2019-06-14 ENCOUNTER — Encounter: Payer: Self-pay | Admitting: Physical Therapy

## 2019-06-14 ENCOUNTER — Ambulatory Visit: Payer: Medicare Other | Attending: Orthopedic Surgery | Admitting: Physical Therapy

## 2019-06-14 ENCOUNTER — Other Ambulatory Visit: Payer: Self-pay

## 2019-06-14 DIAGNOSIS — R2689 Other abnormalities of gait and mobility: Secondary | ICD-10-CM | POA: Diagnosis not present

## 2019-06-14 DIAGNOSIS — M25661 Stiffness of right knee, not elsewhere classified: Secondary | ICD-10-CM | POA: Diagnosis not present

## 2019-06-14 DIAGNOSIS — M25561 Pain in right knee: Secondary | ICD-10-CM | POA: Diagnosis not present

## 2019-06-14 NOTE — Therapy (Signed)
[facility] [address] [place], [place], [address] Phone: [phone]   Fax:  [phone]  Physical Therapy Evaluation  Patient Details  Name: Marissa Hart MRN: [id number] Date of Birth: 01/12/1935 Referring Provider (PT): Ardeen Jourdain, [place]   Encounter Date: 06/14/2019  PT End of Session - 06/14/19 1208    Visit Number  1    Date for PT Re-Evaluation  08/09/19    Authorization Type  BCBS Medicare    Authorization Time Period  06/14/19 to 08/09/19    PT Start Time  1115    PT Stop Time  1200    PT Time Calculation (min)  45 min    Activity Tolerance  No increased pain;Patient tolerated treatment well    Behavior During Therapy  [facility] for tasks assessed/performed       Past Medical History:  Diagnosis Date  . Acquired renal cyst of right kidney   . Anxiety   . Atrial fibrillation and flutter ([place])    s/p PVI and CTI by Dr Rayann Heman 04/2013  . Blood transfusion   . Cataract   . CHEST WALL PAIN, HX OF 07/28/2007   Qualifier: Diagnosis of  By: Julien Girt CMA, [name]    . Diverticulosis of colon   . DJD (degenerative joint disease) 10/18/2011  . Erysipelas   . Fibromyalgia   . FIBROMYALGIA 07/28/2007   Qualifier: Diagnosis of  By: Julien Girt CMA, [name]    . GERD (gastroesophageal reflux disease)   . Hiatal hernia   . Hot flashes - on low dose HRT with gynecologist, Dr. Nori Riis 03/31/2014  . Hx of colonic polyps   . Hyperlipidemia   . MITRAL VALVE PROLAPSE 07/28/2007   Qualifier: Diagnosis of  By: Julien Girt CMA, [name]    . PONV (postoperative nausea and vomiting)   . Supraventricular tachycardia ([place])    AV nodal reentry  s/p RFCA 2001  . Thyroid disease     Past Surgical History:  Procedure Laterality Date  . ABDOMINAL HYSTERECTOMY     heavy menses, prolonged periods. no cancer. cervix remained.   . ATRIAL FIBRILLATION ABLATION N/A 04/30/2013   Procedure: ATRIAL FIBRILLATION ABLATION;  Surgeon: [name], MD;   Location: [facility] CATH LAB;  Service: Cardiovascular;  Laterality: N/A;PVI and CTI by Dr Rayann Heman   . BREAST BIOPSY Left    benign  . BREAST BIOPSY  2011   benign  . CATARACT EXTRACTION BILATERAL W/ ANTERIOR VITRECTOMY    . COLONOSCOPY    . ESOPHAGOGASTRODUODENOSCOPY    . KNEE ARTHROSCOPY Right 2012  . KNEE SURGERY Right 08/22/2018  . LOOP RECORDER INSERTION N/A 05/24/2017   Procedure: LOOP RECORDER INSERTION;  Surgeon: [name], MD;  Location: [place] CV LAB;  Service: Cardiovascular;  Laterality: N/A;  . OOPHORECTOMY     done for cyst on ovaries.   [name] Kitchen ROTATOR CUFF REPAIR  08/2001   Dr. Gladstone Lighter  . SVT ABLATION N/A 07/20/2017   Procedure: SVT ABLATION;  Surgeon: [name], MD;  Location: [place] CV LAB;  Service: Cardiovascular;  Laterality: N/A;  . TEE WITHOUT CARDIOVERSION N/A 04/29/2013   Procedure: TRANSESOPHAGEAL ECHOCARDIOGRAM (TEE);  Surgeon: Jettie Booze, MD;  Location: [place];  Service: Cardiovascular;  Laterality: N/A;  . TOTAL KNEE ARTHROPLASTY Right 06/10/2019   Procedure: TOTAL KNEE ARTHROPLASTY;  Surgeon: Gaynelle Arabian, MD;  Location: [facility];  Service: Orthopedics;  Laterality: Right;  . UPPER GASTROINTESTINAL ENDOSCOPY      There were no vitals  filed for this visit.   Subjective Assessment - 06/14/19 1121    Subjective  Pt had Rt TKA on 06/10/19. She has been doing well but having difficulty with getting in/out of the chair as well as bringing her leg into the bed. She has some swelling and pain is improving. She is still taking her Rx provided by the surgeon.    Pertinent History  mitral valve prolapse, a-fib    Limitations  Walking    Patient Stated Goals  improve mobility    Currently in Pain?  Yes    Pain Score  2     Pain Orientation  Right    Pain Descriptors / Indicators  Dull;Aching    Pain Type  Surgical pain    Pain Radiating Towards  none    Pain Onset  In the past 7 days    Pain Frequency  Intermittent    Aggravating Factors    sit/standing; walking    Pain Relieving Factors  medication helps    Effect of Pain on Daily Activities  limited mobility         [facility] PT Assessment - 06/14/19 0001      Assessment   Medical Diagnosis  Rt TKA    Referring Provider (PT)  Ardeen Jourdain, PA-C    Onset Date/Surgical Date  06/10/19    Next MD Visit  beginning of October    Prior Therapy  none       Precautions   Precautions  None      Restrictions   Weight Bearing Restrictions  No      Balance Screen   Has the patient fallen in the past 6 months  No    Has the patient had a decrease in activity level because of a fear of falling?   No    Is the patient reluctant to leave their home because of a fear of falling?   No      Home Environment   Living Environment  Private residence    Additional Comments  no stairs into the home, 2-3 steps on the sidewalk up to her house no handrails       Prior Function   Leisure  using [facility] prior to surgery       Cognition   Overall Cognitive Status  Within Functional Limits for tasks assessed      Observation/Other Assessments   Observations  pt still wearing post-op bandage      Observation/Other Assessments-Edema    Edema  Circumferential   Rt knee joint: 47cm, Lt 41cm     ROM / Strength   AROM / PROM / Strength  PROM      PROM   Overall PROM Comments  Rt knee flexion: 65 deg  extension: 20 deg lacking       Transfers   Five time sit to stand comments   28 sec, UE support and weight shift Lt     Comments  Pt requires assistance with getting Rt LE onto table      Ambulation/Gait   Gait Comments  step to pattern using RW, Rt knee flexion during stance       6 minute walk test results    Endurance additional comments  TUG: 34 sec with RW                Objective measurements completed on examination: See above findings.  PT Education - 06/14/19 1218    Education Details  eval findings/POC    Person(s) Educated  Patient     Methods  Explanation    Comprehension  Verbalized understanding       PT Short Term Goals - 06/14/19 1154      PT SHORT TERM GOAL #1   Title  Pt will demo independence and understanding of her initial HEP in order to increase knee Rom and strength.    Time  4    Period  Weeks    Status  New    Target Date  07/12/19      PT SHORT TERM GOAL #2   Title  Pt will be able to complete sit to stand from a chair with or without UE support and equal weight-bearing through the LEs throughout her session.    Time  4    Period  Weeks    Status  New      PT SHORT TERM GOAL #3   Title  Pt will have atleast 90 deg of Rt knee flexion and lacking no more than 5 deg from full extension.    Time  4    Period  Weeks    Status  New      PT SHORT TERM GOAL #4   Title  Pt will have increased strength in the Rt LE evident by her ability to independently lift her leg onto the table throughout her session.    Time  4    Period  Weeks    Status  New        PT Long Term Goals - 06/14/19 1155      PT LONG TERM GOAL #1   Title  Pt will be able to complete 5x sit to stand in less than 14 sec which will reflect improvements in functional strength.    Time  8    Period  Weeks    Status  New    Target Date  08/09/19      PT LONG TERM GOAL #2   Title  Pt will be able to ascend and descend clinic steps atleast 2/3 trials with no more than 1 handrail and reciprocal pattern.    Time  8    Period  Weeks    Status  New      PT LONG TERM GOAL #3   Title  Pt will have atleast 110 deg of Rt active knee flexion full extension to improve walking mechanics and efficiency with daily tasks.    Time  8    Period  Weeks    Status  New      PT LONG TERM GOAL #4   Title  Pt will be able to complete TUG with LRAD in less than 20 sec to reflect improvements in her mobility.    Time  8    Period  Weeks    Status  New             Plan - 06/14/19 1211    Clinical Impression Statement  Pt is a pleasant  84 y.o F referred to OPPT s/p Rt TKA on 06/10/19. Pt currently ambulates with RW. She has limited Rt knee extension by 25 deg and flexion to 65 deg. She has post-operative swelling and pain. In addition, her mobility is impaired evident by her performance on sit to stand and TUG. She would benefit from skilled PT to address her limitations in ROM, strength and proprioception  in order to promote her independence with daily activity at home and in the community.    Personal Factors and Comorbidities  Age;Fitness    Examination-Activity Limitations  Squat;Stairs    Stability/Clinical Decision Making  Stable/Uncomplicated    Clinical Decision Making  Low    Rehab Potential  Good    PT Frequency  2x / week    PT Duration  8 weeks    PT Treatment/Interventions  ADLs/Self Care Home Management;Cryotherapy;Electrical Stimulation;Therapeutic activities;Therapeutic exercise;Neuromuscular re-education;Balance training;Patient/family education;Manual techniques;Taping;Dry needling;Passive range of motion;Scar mobilization    PT Next Visit Plan  Update HEP from hospital if needed; knee ROM progression; gait training; game ready end of session    PT Home Exercise Plan  currently completing HEP from hospital    Consulted and Agree with Plan of Care  Patient       Patient will benefit from skilled therapeutic intervention in order to improve the following deficits and impairments:  Abnormal gait, Decreased balance, Decreased endurance, Decreased mobility, Difficulty walking, Decreased strength, Decreased activity tolerance, Decreased range of motion, Increased edema, Pain, Improper body mechanics, Increased muscle spasms  Visit Diagnosis: Stiffness of right knee, not elsewhere classified  Acute pain of right knee  Other abnormalities of gait and mobility     Problem List Patient Active Problem List   Diagnosis Date Noted  . OA (osteoarthritis) of knee 06/10/2019  . Tachyarrhythmia 02/14/2018  . Left  hip pain 03/28/2016  . Atrial fibrillation ([place]) 03/31/2014  . Hot flashes - on low dose HRT with gynecologist, Dr. Nori Riis 03/31/2014  . Hypothyroidism 07/28/2007    12:19 PM,06/14/19 Sherol Dade PT, DPT [facility] [facility] [address] [place], [place], [address] Phone: [phone]   Fax:  [phone]  Name: KYMIAH DAUBERMAN MRN: [id number] Date of Birth: 1935-09-17

## 2019-06-17 ENCOUNTER — Other Ambulatory Visit: Payer: Self-pay

## 2019-06-17 ENCOUNTER — Ambulatory Visit: Payer: Medicare Other

## 2019-06-17 DIAGNOSIS — M25661 Stiffness of right knee, not elsewhere classified: Secondary | ICD-10-CM | POA: Diagnosis not present

## 2019-06-17 DIAGNOSIS — M25561 Pain in right knee: Secondary | ICD-10-CM | POA: Diagnosis not present

## 2019-06-17 DIAGNOSIS — R2689 Other abnormalities of gait and mobility: Secondary | ICD-10-CM

## 2019-06-17 NOTE — Therapy (Signed)
[facility] [address], [place], [place], [address] Phone: [phone]   Fax:  [phone]  Physical Therapy Treatment  Patient Details  Name: Marissa Hart MRN: [id number] Date of Birth: 04-20-1935 Referring Provider (PT): Ardeen Jourdain, [place]   Encounter Date: 06/17/2019  PT End of Session - 06/17/19 1555    Visit Number  2    Date for PT Re-Evaluation  08/09/19    Authorization Type  BCBS Medicare    PT Start Time  [id number]    PT Stop Time  [id number]    PT Time Calculation (min)  56 min    Activity Tolerance  No increased pain;Patient tolerated treatment well    Behavior During Therapy  [facility] for tasks assessed/performed       Past Medical History:  Diagnosis Date  . Acquired renal cyst of right kidney   . Anxiety   . Atrial fibrillation and flutter ([place])    s/p PVI and CTI by Dr Rayann Heman 04/2013  . Blood transfusion   . Cataract   . CHEST WALL PAIN, HX OF 07/28/2007   Qualifier: Diagnosis of  By: Julien Girt CMA, [name]    . Diverticulosis of colon   . DJD (degenerative joint disease) 10/18/2011  . Erysipelas   . Fibromyalgia   . FIBROMYALGIA 07/28/2007   Qualifier: Diagnosis of  By: Julien Girt CMA, [name]    . GERD (gastroesophageal reflux disease)   . Hiatal hernia   . Hot flashes - on low dose HRT with gynecologist, Dr. Nori Riis 03/31/2014  . Hx of colonic polyps   . Hyperlipidemia   . MITRAL VALVE PROLAPSE 07/28/2007   Qualifier: Diagnosis of  By: Julien Girt CMA, [name]    . PONV (postoperative nausea and vomiting)   . Supraventricular tachycardia ([place])    AV nodal reentry  s/p RFCA 2001  . Thyroid disease     Past Surgical History:  Procedure Laterality Date  . ABDOMINAL HYSTERECTOMY     heavy menses, prolonged periods. no cancer. cervix remained.   . ATRIAL FIBRILLATION ABLATION N/A 04/30/2013   Procedure: ATRIAL FIBRILLATION ABLATION;  Surgeon: [name], MD;  Location: [facility] CATH LAB;  Service: Cardiovascular;   Laterality: N/A;PVI and CTI by Dr Rayann Heman   . BREAST BIOPSY Left    benign  . BREAST BIOPSY  2011   benign  . CATARACT EXTRACTION BILATERAL W/ ANTERIOR VITRECTOMY    . COLONOSCOPY    . ESOPHAGOGASTRODUODENOSCOPY    . KNEE ARTHROSCOPY Right 2012  . KNEE SURGERY Right 08/22/2018  . LOOP RECORDER INSERTION N/A 05/24/2017   Procedure: LOOP RECORDER INSERTION;  Surgeon: [name], MD;  Location: [place] CV LAB;  Service: Cardiovascular;  Laterality: N/A;  . OOPHORECTOMY     done for cyst on ovaries.   [name] Kitchen ROTATOR CUFF REPAIR  08/2001   Dr. Gladstone Lighter  . SVT ABLATION N/A 07/20/2017   Procedure: SVT ABLATION;  Surgeon: [name], MD;  Location: [place] CV LAB;  Service: Cardiovascular;  Laterality: N/A;  . TEE WITHOUT CARDIOVERSION N/A 04/29/2013   Procedure: TRANSESOPHAGEAL ECHOCARDIOGRAM (TEE);  Surgeon: Jettie Booze, MD;  Location: [place];  Service: Cardiovascular;  Laterality: N/A;  . TOTAL KNEE ARTHROPLASTY Right 06/10/2019   Procedure: TOTAL KNEE ARTHROPLASTY;  Surgeon: Gaynelle Arabian, MD;  Location[facility];  Service: Orthopedics;  Laterality: Right;  . UPPER GASTROINTESTINAL ENDOSCOPY      There were no vitals filed for this visit.  Subjective Assessment - 06/17/19 1518  Subjective  I am feeling OK.  I've been doing my exercises    Currently in Pain?  Yes    Pain Score  2     Pain Location  Knee    Pain Orientation  Right    Pain Descriptors / Indicators  Dull;Aching;Tightness    Pain Type  Surgical pain         [facility] PT Assessment - 06/17/19 0001      PROM   Overall PROM   Deficits    PROM Assessment Site  Knee    Right/Left Knee  Right    Right Knee Extension  18    Right Knee Flexion  76                   [facility] Adult PT Treatment/Exercise - 06/17/19 0001      Exercises   Exercises  Knee/Hip      Knee/Hip Exercises: Stretches   Active Hamstring Stretch  3 reps;20 seconds;Right    Knee: Self-Stretch to increase Flexion   Right;10 seconds;5 reps      Knee/Hip Exercises: Aerobic   Nustep  Level 1x 6 minutes      Knee/Hip Exercises: Seated   Long Arc Quad  Strengthening;Right;2 sets;10 reps    Long Arc Quad Limitations  partial ROM due to limited strength      Knee/Hip Exercises: Supine   Quad Sets  Strengthening;Right;15 reps    Heel Slides  Strengthening;Right;10 reps   with strap     Modalities   Modalities  Vasopneumatic      Vasopneumatic   Number Minutes Vasopneumatic   15 minutes    Vasopnuematic Location   Knee    Vasopneumatic Pressure  Medium    Vasopneumatic Temperature   3 snowflakes               PT Short Term Goals - 06/14/19 1154      PT SHORT TERM GOAL #1   Title  Pt will demo independence and understanding of her initial HEP in order to increase knee Rom and strength.    Time  4    Period  Weeks    Status  New    Target Date  07/12/19      PT SHORT TERM GOAL #2   Title  Pt will be able to complete sit to stand from a chair with or without UE support and equal weight-bearing through the LEs throughout her session.    Time  4    Period  Weeks    Status  New      PT SHORT TERM GOAL #3   Title  Pt will have atleast 90 deg of Rt knee flexion and lacking no more than 5 deg from full extension.    Time  4    Period  Weeks    Status  New      PT SHORT TERM GOAL #4   Title  Pt will have increased strength in the Rt LE evident by her ability to independently lift her leg onto the table throughout her session.    Time  4    Period  Weeks    Status  New        PT Long Term Goals - 06/14/19 1155      PT LONG TERM GOAL #1   Title  Pt will be able to complete 5x sit to stand in less than 14 sec which will reflect improvements in functional strength.  Time  8    Period  Weeks    Status  New    Target Date  08/09/19      PT LONG TERM GOAL #2   Title  Pt will be able to ascend and descend clinic steps atleast 2/3 trials with no more than 1 handrail and reciprocal  pattern.    Time  8    Period  Weeks    Status  New      PT LONG TERM GOAL #3   Title  Pt will have atleast 110 deg of Rt active knee flexion full extension to improve walking mechanics and efficiency with daily tasks.    Time  8    Period  Weeks    Status  New      PT LONG TERM GOAL #4   Title  Pt will be able to complete TUG with LRAD in less than 20 sec to reflect improvements in her mobility.    Time  8    Period  Weeks    Status  New            Plan - 06/17/19 1545    Clinical Impression Statement  Pt with first time follow-up after evaluation s/p Rt TKA.  PT focused session on gentle flexibility, quad activation, gait and edema management.  PT reviewed and modified exercises issued at the hospital.  Pt with edema, limited A/ROM and reduced quad activation (fair quad set) consistent with 1 week post-op.  PT emphasized the importance of compliance with HEP and frequent A/ROM of the Rt knee and provided tactile cues to improve alignment and quad activation with exercise.  Pt will continue to benefit from skilled PT for Rt knee strength, flexibility, gait and edema management.    PT Frequency  2x / week    PT Duration  8 weeks    PT Treatment/Interventions  ADLs/Self Care Home Management;Cryotherapy;Electrical Stimulation;Therapeutic activities;Therapeutic exercise;Neuromuscular re-education;Balance training;Patient/family education;Manual techniques;Taping;Dry needling;Passive range of motion;Scar mobilization    PT Next Visit Plan  Continue Rt knee flexibility, strength, ROM and edema management.    PT Home Exercise Plan  HEP from hospital: quad sets, heel slides (seated and supine), long arc quads, ankle pumps    Consulted and Agree with Plan of Care  Patient       Patient will benefit from skilled therapeutic intervention in order to improve the following deficits and impairments:  Abnormal gait, Decreased balance, Decreased endurance, Decreased mobility, Difficulty walking,  Decreased strength, Decreased activity tolerance, Decreased range of motion, Increased edema, Pain, Improper body mechanics, Increased muscle spasms  Visit Diagnosis: Stiffness of right knee, not elsewhere classified  Acute pain of right knee  Other abnormalities of gait and mobility     Problem List Patient Active Problem List   Diagnosis Date Noted  . OA (osteoarthritis) of knee 06/10/2019  . Tachyarrhythmia 02/14/2018  . Left hip pain 03/28/2016  . Atrial fibrillation ([place]) 03/31/2014  . Hot flashes - on low dose HRT with gynecologist, Dr. Nori Riis 03/31/2014  . Hypothyroidism 07/28/2007    Sigurd Sos, PT 06/17/19 3:57 PM  [facility] [address] [place], [place], [address] Phone: [phone]   Fax:  [phone]  Name: MIAJA NORCOTT MRN: [id number] Date of Birth: Oct 07, 1934

## 2019-06-18 NOTE — Progress Notes (Signed)
Carelink Summary Report / Loop Recorder 

## 2019-06-19 ENCOUNTER — Encounter: Payer: Self-pay | Admitting: Physical Therapy

## 2019-06-19 ENCOUNTER — Other Ambulatory Visit: Payer: Self-pay

## 2019-06-19 ENCOUNTER — Ambulatory Visit: Payer: Medicare Other | Admitting: Physical Therapy

## 2019-06-19 DIAGNOSIS — M25661 Stiffness of right knee, not elsewhere classified: Secondary | ICD-10-CM | POA: Diagnosis not present

## 2019-06-19 DIAGNOSIS — M25561 Pain in right knee: Secondary | ICD-10-CM | POA: Diagnosis not present

## 2019-06-19 DIAGNOSIS — R2689 Other abnormalities of gait and mobility: Secondary | ICD-10-CM | POA: Diagnosis not present

## 2019-06-19 NOTE — Therapy (Signed)
[facility] [address] [place], [place], [address] Phone: [phone]   Fax:  [phone]  Physical Therapy Treatment  Patient Details  Name: Marissa Hart MRN: [id number] Date of Birth: [birth year] Referring Provider (PT): Ardeen Jourdain, [place]   Encounter Date: 06/19/2019  PT End of Session - 06/19/19 1230    Visit Number  3    Date for PT Re-Evaluation  08/09/19    Authorization Type  BCBS Medicare    Authorization Time Period  06/14/19 to 08/09/19    PT Start Time  1226    PT Stop Time  1315    PT Time Calculation (min)  49 min    Activity Tolerance  Patient tolerated treatment well    Behavior During Therapy  [facility] for tasks assessed/performed       Past Medical History:  Diagnosis Date  . Acquired renal cyst of right kidney   . Anxiety   . Atrial fibrillation and flutter ([place])    s/p PVI and CTI by Dr Rayann Heman 04/2013  . Blood transfusion   . Cataract   . CHEST WALL PAIN, HX OF 07/28/2007   Qualifier: Diagnosis of  By: Julien Girt CMA, [name]    . Diverticulosis of colon   . DJD (degenerative joint disease) 10/18/2011  . Erysipelas   . Fibromyalgia   . FIBROMYALGIA 07/28/2007   Qualifier: Diagnosis of  By: Julien Girt CMA, [name]    . GERD (gastroesophageal reflux disease)   . Hiatal hernia   . Hot flashes - on low dose HRT with gynecologist, Dr. Nori Riis 03/31/2014  . Hx of colonic polyps   . Hyperlipidemia   . MITRAL VALVE PROLAPSE 07/28/2007   Qualifier: Diagnosis of  By: Julien Girt CMA, [name]    . PONV (postoperative nausea and vomiting)   . Supraventricular tachycardia ([place])    AV nodal reentry  s/p RFCA 2001  . Thyroid disease     Past Surgical History:  Procedure Laterality Date  . ABDOMINAL HYSTERECTOMY     heavy menses, prolonged periods. no cancer. cervix remained.   . ATRIAL FIBRILLATION ABLATION N/A 04/30/2013   Procedure: ATRIAL FIBRILLATION ABLATION;  Surgeon: [name], MD;  Location: [facility] CATH LAB;   Service: Cardiovascular;  Laterality: N/A;PVI and CTI by Dr Rayann Heman   . BREAST BIOPSY Left    benign  . BREAST BIOPSY  2011   benign  . CATARACT EXTRACTION BILATERAL W/ ANTERIOR VITRECTOMY    . COLONOSCOPY    . ESOPHAGOGASTRODUODENOSCOPY    . KNEE ARTHROSCOPY Right 2012  . KNEE SURGERY Right 08/22/2018  . LOOP RECORDER INSERTION N/A 05/24/2017   Procedure: LOOP RECORDER INSERTION;  Surgeon: [name], MD;  Location: [place] CV LAB;  Service: Cardiovascular;  Laterality: N/A;  . OOPHORECTOMY     done for cyst on ovaries.   [name] Kitchen ROTATOR CUFF REPAIR  08/2001   Dr. Gladstone Lighter  . SVT ABLATION N/A 07/20/2017   Procedure: SVT ABLATION;  Surgeon: [name], MD;  Location: [place] CV LAB;  Service: Cardiovascular;  Laterality: N/A;  . TEE WITHOUT CARDIOVERSION N/A 04/29/2013   Procedure: TRANSESOPHAGEAL ECHOCARDIOGRAM (TEE);  Surgeon: Jettie Booze, MD;  Location: [facility];  Service: Cardiovascular;  Laterality: N/A;  . TOTAL KNEE ARTHROPLASTY Right 06/10/2019   Procedure: TOTAL KNEE ARTHROPLASTY;  Surgeon: Gaynelle Arabian, MD;  Location: [facility];  Service: Orthopedics;  Laterality: Right;  . UPPER GASTROINTESTINAL ENDOSCOPY      There were no vitals filed for  this visit.  Subjective Assessment - 06/19/19 1231    Subjective  My knee is so tight.Pain not bad, getting beter.    Currently in Pain?  Yes    Pain Score  2     Pain Location  Knee    Pain Orientation  Right    Pain Descriptors / Indicators  Sore    Multiple Pain Sites  No         [facility] PT Assessment - 06/19/19 0001      AROM   Overall AROM   --   Knee Flexion 86 degrees supine                  [facility] Adult PT Treatment/Exercise - 06/19/19 0001      Knee/Hip Exercises: Aerobic   Nustep  Level 1x 6 minutes   LE only     Knee/Hip Exercises: Standing   Hip Flexion  AROM;Both;1 set;10 reps;Knee bent    Other Standing Knee Exercises  Weight shifting standing at walker 1 min side to side       Knee/Hip Exercises: Seated   Long Arc Quad  Strengthening;Right;2 sets;10 reps    Long Arc Quad Limitations  partial ROM due to limited strength    Knee/Hip Flexion  Floor slider to assist 20x      Knee/Hip Exercises: Supine   Heel Slides  AAROM;Right;20 reps    Heel Slides Limitations  Used floor slider       Vasopneumatic   Number Minutes Vasopneumatic   15 minutes    Vasopnuematic Location   Knee    Vasopneumatic Pressure  Medium    Vasopneumatic Temperature   3 snowflakes      Manual Therapy   Soft tissue mobilization  Rt quad and posterior knee soft tissue work               PT Short Term Goals - 06/14/19 1154      PT SHORT TERM GOAL #1   Title  Pt will demo independence and understanding of her initial HEP in order to increase knee Rom and strength.    Time  4    Period  Weeks    Status  New    Target Date  07/12/19      PT SHORT TERM GOAL #2   Title  Pt will be able to complete sit to stand from a chair with or without UE support and equal weight-bearing through the LEs throughout her session.    Time  4    Period  Weeks    Status  New      PT SHORT TERM GOAL #3   Title  Pt will have atleast 90 deg of Rt knee flexion and lacking no more than 5 deg from full extension.    Time  4    Period  Weeks    Status  New      PT SHORT TERM GOAL #4   Title  Pt will have increased strength in the Rt LE evident by her ability to independently lift her leg onto the table throughout her session.    Time  4    Period  Weeks    Status  New        PT Long Term Goals - 06/14/19 1155      PT LONG TERM GOAL #1   Title  Pt will be able to complete 5x sit to stand in less than 14 sec which will reflect  improvements in functional strength.    Time  8    Period  Weeks    Status  New    Target Date  08/09/19      PT LONG TERM GOAL #2   Title  Pt will be able to ascend and descend clinic steps atleast 2/3 trials with no more than 1 handrail and reciprocal pattern.     Time  8    Period  Weeks    Status  New      PT LONG TERM GOAL #3   Title  Pt will have atleast 110 deg of Rt active knee flexion full extension to improve walking mechanics and efficiency with daily tasks.    Time  8    Period  Weeks    Status  New      PT LONG TERM GOAL #4   Title  Pt will be able to complete TUG with LRAD in less than 20 sec to reflect improvements in her mobility.    Time  8    Period  Weeks    Status  New            Plan - 06/19/19 1230    Clinical Impression Statement  Pt achieved 86 degrees AROM in supine today which was in the beginning of treatment. Ptneeds verbal cues to not contract her back muscles when she bends her knee. Posterior knee tissues are tight and knee remains with moderate edema. Pt verbally expresses swelling more limiting than pain over the last few days. She is compliant with her HEP.    Personal Factors and Comorbidities  Age;Fitness    Examination-Activity Limitations  Squat;Stairs    Stability/Clinical Decision Making  Stable/Uncomplicated    Rehab Potential  Good    PT Frequency  2x / week    PT Duration  8 weeks    PT Treatment/Interventions  ADLs/Self Care Home Management;Cryotherapy;Electrical Stimulation;Therapeutic activities;Therapeutic exercise;Neuromuscular re-education;Balance training;Patient/family education;Manual techniques;Taping;Dry needling;Passive range of motion;Scar mobilization    PT Next Visit Plan  Continue Rt knee flexibility, strength, ROM and edema management.    PT Home Exercise Plan  HEP from hospital: quad sets, heel slides (seated and supine), long arc quads, ankle pumps    Consulted and Agree with Plan of Care  Patient       Patient will benefit from skilled therapeutic intervention in order to improve the following deficits and impairments:  Abnormal gait, Decreased balance, Decreased endurance, Decreased mobility, Difficulty walking, Decreased strength, Decreased activity tolerance, Decreased range  of motion, Increased edema, Pain, Improper body mechanics, Increased muscle spasms  Visit Diagnosis: Stiffness of right knee, not elsewhere classified  Acute pain of right knee  Other abnormalities of gait and mobility     Problem List Patient Active Problem List   Diagnosis Date Noted  . OA (osteoarthritis) of knee 06/10/2019  . Tachyarrhythmia 02/14/2018  . Left hip pain 03/28/2016  . Atrial fibrillation ([place]) 03/31/2014  . Hot flashes - on low dose HRT with gynecologist, Dr. Nori Riis 03/31/2014  . Hypothyroidism 07/28/2007    Jabarie Pop, PTA 06/19/2019, 1:00 PM  [facility] [address], [place], [place], [address] Phone: [phone]   Fax:  [phone]  Name: LAMECCA BEDRICK MRN: [id number] Date of Birth: 03/31/1935

## 2019-06-21 ENCOUNTER — Ambulatory Visit: Payer: Medicare Other | Attending: Orthopedic Surgery | Admitting: Physical Therapy

## 2019-06-21 ENCOUNTER — Encounter: Payer: Self-pay | Admitting: Physical Therapy

## 2019-06-21 ENCOUNTER — Other Ambulatory Visit: Payer: Self-pay

## 2019-06-21 DIAGNOSIS — R2689 Other abnormalities of gait and mobility: Secondary | ICD-10-CM | POA: Insufficient documentation

## 2019-06-21 DIAGNOSIS — M25561 Pain in right knee: Secondary | ICD-10-CM | POA: Diagnosis not present

## 2019-06-21 DIAGNOSIS — M25661 Stiffness of right knee, not elsewhere classified: Secondary | ICD-10-CM | POA: Diagnosis not present

## 2019-06-21 NOTE — Therapy (Signed)
[facility] [address] [place], [place], [address] Phone: [phone]   Fax:  [phone]  Physical Therapy Treatment  Patient Details  Name: Marissa Hart MRN: [id number] Date of Birth: Aug 01, 1935 Referring Provider (PT): Ardeen Jourdain, [place]   Encounter Date: 06/21/2019  PT End of Session - 06/21/19 1018    Visit Number  4    Date for PT Re-Evaluation  08/09/19    Authorization Type  BCBS Medicare    Authorization Time Period  06/14/19 to 08/09/19    PT Start Time  1015    PT Stop Time  1110    PT Time Calculation (min)  55 min    Activity Tolerance  Patient tolerated treatment well    Behavior During Therapy  [facility] for tasks assessed/performed       Past Medical History:  Diagnosis Date  . Acquired renal cyst of right kidney   . Anxiety   . Atrial fibrillation and flutter ([place])    s/p PVI and CTI by Dr Rayann Heman 04/2013  . Blood transfusion   . Cataract   . CHEST WALL PAIN, HX OF 07/28/2007   Qualifier: Diagnosis of  By: Julien Girt CMA, [name]    . Diverticulosis of colon   . DJD (degenerative joint disease) 10/18/2011  . Erysipelas   . Fibromyalgia   . FIBROMYALGIA 07/28/2007   Qualifier: Diagnosis of  By: Julien Girt CMA, [name]    . GERD (gastroesophageal reflux disease)   . Hiatal hernia   . Hot flashes - on low dose HRT with gynecologist, Dr. Nori Riis 03/31/2014  . Hx of colonic polyps   . Hyperlipidemia   . MITRAL VALVE PROLAPSE 07/28/2007   Qualifier: Diagnosis of  By: Julien Girt CMA, [name]    . PONV (postoperative nausea and vomiting)   . Supraventricular tachycardia ([place])    AV nodal reentry  s/p RFCA 2001  . Thyroid disease     Past Surgical History:  Procedure Laterality Date  . ABDOMINAL HYSTERECTOMY     heavy menses, prolonged periods. no cancer. cervix remained.   . ATRIAL FIBRILLATION ABLATION N/A 04/30/2013   Procedure: ATRIAL FIBRILLATION ABLATION;  Surgeon: [name], MD;  Location: [facility] CATH LAB;   Service: Cardiovascular;  Laterality: N/A;PVI and CTI by Dr Rayann Heman   . BREAST BIOPSY Left    benign  . BREAST BIOPSY  2011   benign  . CATARACT EXTRACTION BILATERAL W/ ANTERIOR VITRECTOMY    . COLONOSCOPY    . ESOPHAGOGASTRODUODENOSCOPY    . KNEE ARTHROSCOPY Right 2012  . KNEE SURGERY Right 08/22/2018  . LOOP RECORDER INSERTION N/A 05/24/2017   Procedure: LOOP RECORDER INSERTION;  Surgeon: [name], MD;  Location: [place] [facility];  Service: Cardiovascular;  Laterality: N/A;  . OOPHORECTOMY     done for cyst on ovaries.   [name] Kitchen ROTATOR CUFF REPAIR  08/2001   Dr. Gladstone Lighter  . SVT ABLATION N/A 07/20/2017   Procedure: SVT ABLATION;  Surgeon: [name], MD;  Location: [place] [facility];  Service: Cardiovascular;  Laterality: N/A;  . TEE WITHOUT CARDIOVERSION N/A 04/29/2013   Procedure: TRANSESOPHAGEAL ECHOCARDIOGRAM (TEE);  Surgeon: Jettie Booze, MD;  Location: [facility];  Service: Cardiovascular;  Laterality: N/A;  . TOTAL KNEE ARTHROPLASTY Right 06/10/2019   Procedure: TOTAL KNEE ARTHROPLASTY;  Surgeon: Gaynelle Arabian, MD;  [facility]: [facility];  Service: Orthopedics;  Laterality: Right;  . UPPER GASTROINTESTINAL ENDOSCOPY      There were no vitals filed for  this visit.  Subjective Assessment - 06/21/19 1019    Subjective  A little pain this AM, but low. i can now get my stockings on and off by myself.    Currently in Pain?  Yes    Pain Score  2     Pain Location  Knee    Pain Orientation  Right    Pain Descriptors / Indicators  Dull;Aching    Aggravating Factors   I just get stiff    Pain Relieving Factors  Meds, ice    Multiple Pain Sites  No         [facility] PT Assessment - 06/21/19 0001      Observation/Other Assessments-Edema    Edema  Circumferential      Circumferential Edema   Circumferential - Right  43 cm       PROM   Overall PROM Comments  Pt began session with 30 degrees of knee bend     Right Knee Extension  --   18 degrees of bend post stretching                   [facility] Adult PT Treatment/Exercise - 06/21/19 0001      Knee/Hip Exercises: Aerobic   Nustep  L1 x 10 min PTA present to discuss status      Knee/Hip Exercises: Seated   Knee/Hip Flexion  Floor slider to assist 20x   Second set hold 5 sec hold, 10x     Knee/Hip Exercises: Supine   Quad Sets  AROM;Right;1 set;10 reps   Required folded towel for tactile cuing,     Vasopneumatic   Number Minutes Vasopneumatic   15 minutes    Vasopnuematic Location   Knee    Vasopneumatic Pressure  Low   Changed to low per pt request   Vasopneumatic Temperature   3 snowflakes      Manual Therapy   Soft tissue mobilization  Rt quad and posterior knee soft tissue work, knee extension stretching to tolerance   Pt with low tolerance to extension work              PT Short Term Goals - 06/14/19 1154      PT SHORT TERM GOAL #1   Title  Pt will demo independence and understanding of her initial HEP in order to increase knee Rom and strength.    Time  4    Period  Weeks    Status  New    Target Date  07/12/19      PT SHORT TERM GOAL #2   Title  Pt will be able to complete sit to stand from a chair with or without UE support and equal weight-bearing through the LEs throughout her session.    Time  4    Period  Weeks    Status  New      PT SHORT TERM GOAL #3   Title  Pt will have atleast 90 deg of Rt knee flexion and lacking no more than 5 deg from full extension.    Time  4    Period  Weeks    Status  New      PT SHORT TERM GOAL #4   Title  Pt will have increased strength in the Rt LE evident by her ability to independently lift her leg onto the table throughout her session.    Time  4    Period  Weeks    Status  New  PT Long Term Goals - 06/14/19 1155      PT LONG TERM GOAL #1   Title  Pt will be able to complete 5x sit to stand in less than 14 sec which will reflect improvements in functional strength.    Time  8    Period  Weeks     Status  New    Target Date  08/09/19      PT LONG TERM GOAL #2   Title  Pt will be able to ascend and descend clinic steps atleast 2/3 trials with no more than 1 handrail and reciprocal pattern.    Time  8    Period  Weeks    Status  New      PT LONG TERM GOAL #3   Title  Pt will have atleast 110 deg of Rt active knee flexion full extension to improve walking mechanics and efficiency with daily tasks.    Time  8    Period  Weeks    Status  New      PT LONG TERM GOAL #4   Title  Pt will be able to complete TUG with LRAD in less than 20 sec to reflect improvements in her mobility.    Time  8    Period  Weeks    Status  New            Plan - 06/21/19 1018    Clinical Impression Statement  We looked specifically at knee extension today as pt began her session with 30 degrees of knee bend. After stretching and PROm for extension PTA was able to get knee back to her original 18 degrees. PTA suggested pt do her quad set more frequently emphasizing knee to floor. PTagreed to try. Edema reduced by 4 cm since evaulation.    Personal Factors and Comorbidities  Age;Fitness    Examination-Activity Limitations  Squat;Stairs    Stability/Clinical Decision Making  Stable/Uncomplicated    Rehab Potential  Good    PT Frequency  2x / week    PT Duration  8 weeks    PT Treatment/Interventions  ADLs/Self Care Home Management;Cryotherapy;Electrical Stimulation;Therapeutic activities;Therapeutic exercise;Neuromuscular re-education;Balance training;Patient/family education;Manual techniques;Taping;Dry needling;Passive range of motion;Scar mobilization    PT Next Visit Plan  Check knee extension and work on this as well as overall ROM. Continue edema management,    PT Home Exercise Plan  HEP from hospital: quad sets, heel slides (seated and supine), long arc quads, ankle pumps    Consulted and Agree with Plan of Care  Patient       Patient will benefit from skilled therapeutic intervention in order  to improve the following deficits and impairments:  Abnormal gait, Decreased balance, Decreased endurance, Decreased mobility, Difficulty walking, Decreased strength, Decreased activity tolerance, Decreased range of motion, Increased edema, Pain, Improper body mechanics, Increased muscle spasms  Visit Diagnosis: Stiffness of right knee, not elsewhere classified  Acute pain of right knee  Other abnormalities of gait and mobility     Problem List Patient Active Problem List   Diagnosis Date Noted  . OA (osteoarthritis) of knee 06/10/2019  . Tachyarrhythmia 02/14/2018  . Left hip pain 03/28/2016  . Atrial fibrillation ([place]) 03/31/2014  . Hot flashes - on low dose HRT with gynecologist, Dr. Nori Riis 03/31/2014  . Hypothyroidism 07/28/2007    Oluwasemilore Pascuzzi, PTA 06/21/2019, 10:59 AM  [facility] [address], [place], [place], [address] Phone: [phone]   Fax:  [phone]  Name: Marissa Hart MRN: [id number] Date of Birth: 28-Jul-1935

## 2019-06-24 ENCOUNTER — Other Ambulatory Visit: Payer: Self-pay

## 2019-06-24 ENCOUNTER — Ambulatory Visit: Payer: Medicare Other | Admitting: Physical Therapy

## 2019-06-24 ENCOUNTER — Encounter: Payer: Self-pay | Admitting: Physical Therapy

## 2019-06-24 DIAGNOSIS — M25661 Stiffness of right knee, not elsewhere classified: Secondary | ICD-10-CM

## 2019-06-24 DIAGNOSIS — R2689 Other abnormalities of gait and mobility: Secondary | ICD-10-CM

## 2019-06-24 DIAGNOSIS — M25561 Pain in right knee: Secondary | ICD-10-CM | POA: Diagnosis not present

## 2019-06-24 NOTE — Therapy (Signed)
[facility] [address] [place], [place], [address] Phone: [phone]   Fax:  [phone]  Physical Therapy Treatment  Patient Details  Name: Marissa Hart MRN: [id number] Date of Birth: 1935/08/01 Referring Provider (PT): Ardeen Jourdain, [place]   Encounter Date: 06/24/2019  PT End of Session - 06/24/19 1527    Visit Number  5    Date for PT Re-Evaluation  08/09/19    Authorization Type  BCBS Medicare    Authorization Time Period  06/14/19 to 08/09/19    PT Start Time  1515    PT Stop Time  1605    PT Time Calculation (min)  50 min    Activity Tolerance  Patient tolerated treatment well    Behavior During Therapy  [facility] for tasks assessed/performed       Past Medical History:  Diagnosis Date  . Acquired renal cyst of right kidney   . Anxiety   . Atrial fibrillation and flutter ([place])    s/p PVI and CTI by Dr Rayann Heman 04/2013  . Blood transfusion   . Cataract   . CHEST WALL PAIN, HX OF 07/28/2007   Qualifier: Diagnosis of  By: Julien Girt CMA, [name]    . Diverticulosis of colon   . DJD (degenerative joint disease) 10/18/2011  . Erysipelas   . Fibromyalgia   . FIBROMYALGIA 07/28/2007   Qualifier: Diagnosis of  By: Julien Girt CMA, [name]    . GERD (gastroesophageal reflux disease)   . Hiatal hernia   . Hot flashes - on low dose HRT with gynecologist, Dr. Nori Riis 03/31/2014  . Hx of colonic polyps   . Hyperlipidemia   . MITRAL VALVE PROLAPSE 07/28/2007   Qualifier: Diagnosis of  By: Julien Girt CMA, [name]    . PONV (postoperative nausea and vomiting)   . Supraventricular tachycardia ([place])    AV nodal reentry  s/p RFCA 2001  . Thyroid disease     Past Surgical History:  Procedure Laterality Date  . ABDOMINAL HYSTERECTOMY     heavy menses, prolonged periods. no cancer. cervix remained.   . ATRIAL FIBRILLATION ABLATION N/A 04/30/2013   Procedure: ATRIAL FIBRILLATION ABLATION;  Surgeon: [name], MD;  Location: [facility] CATH LAB;   Service: Cardiovascular;  Laterality: N/A;PVI and CTI by Dr Rayann Heman   . BREAST BIOPSY Left    benign  . BREAST BIOPSY  2011   benign  . CATARACT EXTRACTION BILATERAL W/ ANTERIOR VITRECTOMY    . COLONOSCOPY    . ESOPHAGOGASTRODUODENOSCOPY    . KNEE ARTHROSCOPY Right 2012  . KNEE SURGERY Right 08/22/2018  . LOOP RECORDER INSERTION N/A 05/24/2017   Procedure: LOOP RECORDER INSERTION;  Surgeon: [name], MD;  Location: [place] CV LAB;  Service: Cardiovascular;  Laterality: N/A;  . OOPHORECTOMY     done for cyst on ovaries.   [name] Kitchen ROTATOR CUFF REPAIR  08/2001   Dr. Gladstone Lighter  . SVT ABLATION N/A 07/20/2017   Procedure: SVT ABLATION;  Surgeon: [name], MD;  Location: [place] CV LAB;  Service: Cardiovascular;  Laterality: N/A;  . TEE WITHOUT CARDIOVERSION N/A 04/29/2013   Procedure: TRANSESOPHAGEAL ECHOCARDIOGRAM (TEE);  Surgeon: Jettie Booze, MD;  Location: [place];  Service: Cardiovascular;  Laterality: N/A;  . TOTAL KNEE ARTHROPLASTY Right 06/10/2019   Procedure: TOTAL KNEE ARTHROPLASTY;  Surgeon: Gaynelle Arabian, MD;  Location: [facility];  Service: Orthopedics;  Laterality: Right;  . UPPER GASTROINTESTINAL ENDOSCOPY      There were no vitals filed for  this visit.  Subjective Assessment - 06/24/19 1528    Subjective  I was sore after last session when we worked on straightening my knee. I have most pain at night.    Pertinent History  mitral valve prolapse, a-fib    Currently in Pain?  Yes    Pain Score  1     Pain Location  Knee    Pain Orientation  Right    Pain Descriptors / Indicators  Dull    Multiple Pain Sites  No         [facility] PT Assessment - 06/24/19 0001      PROM   Overall PROM Comments  Pt began session with -20 knee extension     Right Knee Extension  10   Passive post session   Right Knee Flexion  100   Active 95 degrees in supine pre tx                  [facility] Adult PT Treatment/Exercise - 06/24/19 0001      Knee/Hip Exercises:  Aerobic   Nustep  L2 x 10 min PTA present to discuss status      Knee/Hip Exercises: Seated   Long Arc Quad  AROM;Right;2 sets;10 reps    Knee/Hip Flexion  Floor slider to assist 20x   Second set hold 5 sec hold, 10x     Vasopneumatic   Number Minutes Vasopneumatic   15 minutes    Vasopnuematic Location   Knee    Vasopneumatic Pressure  Low   Changed to low per pt request   Vasopneumatic Temperature   3 snowflakes      Manual Therapy   Soft tissue mobilization  Rt quad and posterior knee soft tissue work, knee extension stretching to tolerance   Pt with low tolerance to extension work              PT Short Term Goals - 06/14/19 1154      PT SHORT TERM GOAL #1   Title  Pt will demo independence and understanding of her initial HEP in order to increase knee Rom and strength.    Time  4    Period  Weeks    Status  New    Target Date  07/12/19      PT SHORT TERM GOAL #2   Title  Pt will be able to complete sit to stand from a chair with or without UE support and equal weight-bearing through the LEs throughout her session.    Time  4    Period  Weeks    Status  New      PT SHORT TERM GOAL #3   Title  Pt will have atleast 90 deg of Rt knee flexion and lacking no more than 5 deg from full extension.    Time  4    Period  Weeks    Status  New      PT SHORT TERM GOAL #4   Title  Pt will have increased strength in the Rt LE evident by her ability to independently lift her leg onto the table throughout her session.    Time  4    Period  Weeks    Status  New        PT Long Term Goals - 06/14/19 1155      PT LONG TERM GOAL #1   Title  Pt will be able to complete 5x sit to stand in less than 14  sec which will reflect improvements in functional strength.    Time  8    Period  Weeks    Status  New    Target Date  08/09/19      PT LONG TERM GOAL #2   Title  Pt will be able to ascend and descend clinic steps atleast 2/3 trials with no more than 1 handrail and  reciprocal pattern.    Time  8    Period  Weeks    Status  New      PT LONG TERM GOAL #3   Title  Pt will have atleast 110 deg of Rt active knee flexion full extension to improve walking mechanics and efficiency with daily tasks.    Time  8    Period  Weeks    Status  New      PT LONG TERM GOAL #4   Title  Pt will be able to complete TUG with LRAD in less than 20 sec to reflect improvements in her mobility.    Time  8    Period  Weeks    Status  New            Plan - 06/24/19 1527    Clinical Impression Statement  Pt made great progress wit hher ROM today. She arrived with 20 degrees of knee flexion, unabe to extend anymore than that. Passive was around 15 degrees. Post sessison that including alot of soft tissue work to the posterior knee and gentle but prolonged stretching into extension pt's PROM was 10-100 degrees. Pt remains with edema alhtough it is reducing nicely each week. Pt continues to walk with rolling walker but is anxious to move to the cane.    Personal Factors and Comorbidities  Age;Fitness    Examination-Activity Limitations  Squat;Stairs    Stability/Clinical Decision Making  Stable/Uncomplicated    Rehab Potential  Good    PT Frequency  2x / week    PT Duration  8 weeks    PT Treatment/Interventions  ADLs/Self Care Home Management;Electrical Stimulation;Therapeutic activities;Therapeutic exercise;Neuromuscular re-education;Balance training;Patient/family education;Manual techniques;Taping;Dry needling;Passive range of motion;Scar mobilization    PT Next Visit Plan  Check knee extension and work on this as well as overall ROM. Continue edema management, quad strength/activation.    PT Home Exercise Plan  HEP from hospital: quad sets, heel slides (seated and supine), long arc quads, ankle pumps    Consulted and Agree with Plan of Care  Patient       Patient will benefit from skilled therapeutic intervention in order to improve the following deficits and  impairments:  Abnormal gait, Decreased balance, Decreased endurance, Decreased mobility, Difficulty walking, Decreased strength, Decreased activity tolerance, Decreased range of motion, Increased edema, Pain, Improper body mechanics, Increased muscle spasms  Visit Diagnosis: Acute pain of right knee  Stiffness of right knee, not elsewhere classified  Other abnormalities of gait and mobility     Problem List Patient Active Problem List   Diagnosis Date Noted  . OA (osteoarthritis) of knee 06/10/2019  . Tachyarrhythmia 02/14/2018  . Left hip pain 03/28/2016  . Atrial fibrillation ([place]) 03/31/2014  . Hot flashes - on low dose HRT with gynecologist, Dr. Nori Riis 03/31/2014  . Hypothyroidism 07/28/2007    Marissa Hart , PTA 06/24/2019, 3:57 PM  [facility] [address] [place], [place], [address] Phone: [phone]   Fax:  [phone]  Name: Marissa Hart MRN: [id number] Date of Birth: 1934-12-12

## 2019-06-26 ENCOUNTER — Encounter: Payer: Self-pay | Admitting: Physical Therapy

## 2019-06-26 ENCOUNTER — Other Ambulatory Visit: Payer: Self-pay

## 2019-06-26 ENCOUNTER — Ambulatory Visit: Payer: Medicare Other | Admitting: Physical Therapy

## 2019-06-26 DIAGNOSIS — M25561 Pain in right knee: Secondary | ICD-10-CM

## 2019-06-26 DIAGNOSIS — R2689 Other abnormalities of gait and mobility: Secondary | ICD-10-CM | POA: Diagnosis not present

## 2019-06-26 DIAGNOSIS — M25661 Stiffness of right knee, not elsewhere classified: Secondary | ICD-10-CM

## 2019-06-26 NOTE — Therapy (Signed)
[facility] [address], [place], [place], [address] Phone: [phone]   Fax:  [phone]  Physical Therapy Treatment  Patient Details  Name: Marissa Hart MRN: [id number] Date of Birth: [birth year] Referring Provider (PT): Ardeen Jourdain, [place]   Encounter Date: 06/26/2019  PT End of Session - 06/26/19 1527    Visit Number  6    Date for PT Re-Evaluation  08/09/19    Authorization Type  BCBS Medicare    Authorization Time Period  06/14/19 to 08/09/19    PT Start Time  1515    PT Stop Time  1610    PT Time Calculation (min)  55 min    Activity Tolerance  Patient tolerated treatment well    Behavior During Therapy  [facility] for tasks assessed/performed       Past Medical History:  Diagnosis Date  . Acquired renal cyst of right kidney   . Anxiety   . Atrial fibrillation and flutter ([place])    s/p PVI and CTI by Dr Rayann Heman 04/2013  . Blood transfusion   . Cataract   . CHEST WALL PAIN, HX OF 07/28/2007   Qualifier: Diagnosis of  By: Julien Girt CMA, [name]    . Diverticulosis of colon   . DJD (degenerative joint disease) 10/18/2011  . Erysipelas   . Fibromyalgia   . FIBROMYALGIA 07/28/2007   Qualifier: Diagnosis of  By: Julien Girt CMA, [name]    . GERD (gastroesophageal reflux disease)   . Hiatal hernia   . Hot flashes - on low dose HRT with gynecologist, Dr. Nori Riis 03/31/2014  . Hx of colonic polyps   . Hyperlipidemia   . MITRAL VALVE PROLAPSE 07/28/2007   Qualifier: Diagnosis of  By: Julien Girt CMA, [name]    . PONV (postoperative nausea and vomiting)   . Supraventricular tachycardia ([place])    AV nodal reentry  s/p RFCA 2001  . Thyroid disease     Past Surgical History:  Procedure Laterality Date  . ABDOMINAL HYSTERECTOMY     heavy menses, prolonged periods. no cancer. cervix remained.   . ATRIAL FIBRILLATION ABLATION N/A 04/30/2013   Procedure: ATRIAL FIBRILLATION ABLATION;  Surgeon: [name], MD;  Location: [facility] [place] [facility] CATH LAB;   Service: Cardiovascular;  Laterality: N/A;PVI and CTI by Dr Rayann Heman   . BREAST BIOPSY Left    benign  . BREAST BIOPSY  2011   benign  . CATARACT EXTRACTION BILATERAL W/ ANTERIOR VITRECTOMY    . COLONOSCOPY    . ESOPHAGOGASTRODUODENOSCOPY    . KNEE ARTHROSCOPY Right 2012  . KNEE SURGERY Right 08/22/2018  . LOOP RECORDER INSERTION N/A 05/24/2017   Procedure: LOOP RECORDER INSERTION;  Surgeon: [name], MD;  Location: [place] [facility];  Service: Cardiovascular;  Laterality: N/A;  . OOPHORECTOMY     done for cyst on ovaries.   [name] Kitchen ROTATOR CUFF REPAIR  08/2001   Dr. Gladstone Lighter  . SVT ABLATION N/A 07/20/2017   Procedure: SVT ABLATION;  Surgeon: [name], MD;  Location: [place] [facility] LAB;  Service: Cardiovascular;  Laterality: N/A;  . TEE WITHOUT CARDIOVERSION N/A 04/29/2013   Procedure: TRANSESOPHAGEAL ECHOCARDIOGRAM (TEE);  Surgeon: Jettie Booze, MD;  Location: [place];  Service: Cardiovascular;  Laterality: N/A;  . TOTAL KNEE ARTHROPLASTY Right 06/10/2019   Procedure: TOTAL KNEE ARTHROPLASTY;  Surgeon: Gaynelle Arabian, MD;  [facility];  Service: Orthopedics;  Laterality: Right;  . UPPER GASTROINTESTINAL ENDOSCOPY      There were no vitals filed for  this visit.  Subjective Assessment - 06/26/19 1529    Subjective  Saw the doctor and received a "ggod report." keep working on straightening the knee. Stockings can come off this SUnday. medial knee tender today.    Currently in Pain?  Yes    Pain Score  4     Pain Location  Knee    Pain Orientation  Medial;Right    Pain Descriptors / Indicators  Tender    Aggravating Factors   Not sure    Pain Relieving Factors  ice    Multiple Pain Sites  No         [facility] PT Assessment - 06/26/19 0001      Observation/Other Assessments-Edema    Edema  Circumferential      Circumferential Edema   Circumferential - Right  39cm   joint line                  [facility] Adult PT Treatment/Exercise - 06/26/19 0001       Ambulation/Gait   Ambulation/Gait  Yes    Ambulation/Gait Assistance  5: Supervision    Ambulation Distance (Feet)  60 Feet    Assistive device  Straight cane      Knee/Hip Exercises: Aerobic   Nustep  L2 x 10 min PTA present to discuss status      Knee/Hip Exercises: Standing   Forward Step Up  Right;1 set;10 reps;Step Height: 2";Step Height: 6"    Forward Step Up Limitations  VC to bend knee and not circumduct hip       Knee/Hip Exercises: Seated   Long Arc Quad  AROM;Right;2 sets;10 reps    Knee/Hip Flexion  Floor slider to assist 20x   Second set hold 5 sec hold, 10x     Knee/Hip Exercises: Supine   Short Arc Quad Sets  AROM;Strengthening;Right;2 sets;10 reps    Other Supine Knee/Hip Exercises  leg press into mat 3 sec 10x, adde to HEP      Vasopneumatic   Number Minutes Vasopneumatic   15 minutes    Vasopnuematic Location   Knee    Vasopneumatic Pressure  Low   Changed to low per pt request   Vasopneumatic Temperature   3 snowflakes      Manual Therapy   Soft tissue mobilization  Rt quad and posterior knee soft tissue work, knee extension stretching to tolerance   Pt with low tolerance to extension work              PT Short Term Goals - 06/14/19 1154      PT SHORT TERM GOAL #1   Title  Pt will demo independence and understanding of her initial HEP in order to increase knee Rom and strength.    Time  4    Period  Weeks    Status  New    Target Date  07/12/19      PT SHORT TERM GOAL #2   Title  Pt will be able to complete sit to stand from a chair with or without UE support and equal weight-bearing through the LEs throughout her session.    Time  4    Period  Weeks    Status  New      PT SHORT TERM GOAL #3   Title  Pt will have atleast 90 deg of Rt knee flexion and lacking no more than 5 deg from full extension.    Time  4    Period  Weeks  Status  New      PT SHORT TERM GOAL #4   Title  Pt will have increased strength in the Rt LE evident by  her ability to independently lift her leg onto the table throughout her session.    Time  4    Period  Weeks    Status  New        PT Long Term Goals - 06/14/19 1155      PT LONG TERM GOAL #1   Title  Pt will be able to complete 5x sit to stand in less than 14 sec which will reflect improvements in functional strength.    Time  8    Period  Weeks    Status  New    Target Date  08/09/19      PT LONG TERM GOAL #2   Title  Pt will be able to ascend and descend clinic steps atleast 2/3 trials with no more than 1 handrail and reciprocal pattern.    Time  8    Period  Weeks    Status  New      PT LONG TERM GOAL #3   Title  Pt will have atleast 110 deg of Rt active knee flexion full extension to improve walking mechanics and efficiency with daily tasks.    Time  8    Period  Weeks    Status  New      PT LONG TERM GOAL #4   Title  Pt will be able to complete TUG with LRAD in less than 20 sec to reflect improvements in her mobility.    Time  8    Period  Weeks    Status  New            Plan - 06/26/19 1528    Clinical Impression Statement  Pt arrives today post MD appt. Pt pleased with pt progress. Today she was able to ambulate 60 feet with a standard cane. She owns a three prong cane and will bring with her to her next appt to practice with. We continue to work on improving her knee extension and quad activation. She was able to do a SAQ with fair quad activation ( previously she couldn't do at all.) Edema at the joint line is less by 4 cm demonstrating a reduction in her swelling.    Personal Factors and Comorbidities  Age;Fitness    Examination-Activity Limitations  Squat;Stairs    Stability/Clinical Decision Making  Stable/Uncomplicated    Rehab Potential  Good    PT Frequency  2x / week    PT Duration  8 weeks    PT Treatment/Interventions  ADLs/Self Care Home Management;Electrical Stimulation;Therapeutic activities;Therapeutic exercise;Neuromuscular  re-education;Balance training;Patient/family education;Manual techniques;Taping;Dry needling;Passive range of motion;Scar mobilization    PT Next Visit Plan  Check knee extension and work on this as well as overall ROM. Continue edema management, quad strength/activation.    PT Home Exercise Plan  HEP from hospital: quad sets, heel slides (seated and supine), long arc quads, ankle pumps    Consulted and Agree with Plan of Care  Patient       Patient will benefit from skilled therapeutic intervention in order to improve the following deficits and impairments:  Abnormal gait, Decreased balance, Decreased endurance, Decreased mobility, Difficulty walking, Decreased strength, Decreased activity tolerance, Decreased range of motion, Increased edema, Pain, Improper body mechanics, Increased muscle spasms  Visit Diagnosis: Acute pain of right knee  Stiffness of right knee, not  elsewhere classified  Other abnormalities of gait and mobility     Problem List Patient Active Problem List   Diagnosis Date Noted  . OA (osteoarthritis) of knee 06/10/2019  . Tachyarrhythmia 02/14/2018  . Left hip pain 03/28/2016  . Atrial fibrillation ([place]) 03/31/2014  . Hot flashes - on low dose HRT with gynecologist, Dr. Nori Riis 03/31/2014  . Hypothyroidism 07/28/2007    Emory Leaver, PTA 06/26/2019, 3:59 PM  [facility] [address] [place], [place], [address] Phone: [phone]   Fax:  [phone]  Name: BELLAH SCHOLLENBERGER MRN: [id number] Date of Birth: 1934-10-25

## 2019-06-28 ENCOUNTER — Encounter: Payer: Self-pay | Admitting: Physical Therapy

## 2019-06-28 ENCOUNTER — Other Ambulatory Visit: Payer: Self-pay

## 2019-06-28 ENCOUNTER — Ambulatory Visit: Payer: Medicare Other | Admitting: Physical Therapy

## 2019-06-28 DIAGNOSIS — R2689 Other abnormalities of gait and mobility: Secondary | ICD-10-CM | POA: Diagnosis not present

## 2019-06-28 DIAGNOSIS — M25561 Pain in right knee: Secondary | ICD-10-CM

## 2019-06-28 DIAGNOSIS — M25661 Stiffness of right knee, not elsewhere classified: Secondary | ICD-10-CM | POA: Diagnosis not present

## 2019-06-28 NOTE — Therapy (Signed)
[facility] [address], [place], [place], [address] Phone: [phone]   Fax:  [phone]  Physical Therapy Treatment  Patient Details  Name: Marissa Hart MRN: [id number] Date of Birth: 10/06/1934 Referring Provider (PT): Ardeen Jourdain, [place]   Encounter Date: 06/28/2019  PT End of Session - 06/28/19 0957    Visit Number  7    Date for PT Re-Evaluation  08/09/19    Authorization Type  BCBS Medicare    Authorization Time Period  06/14/19 to 08/09/19    PT Start Time  0957    PT Stop Time  1055    PT Time Calculation (min)  58 min    Activity Tolerance  Patient tolerated treatment well    Behavior During Therapy  [facility] for tasks assessed/performed       Past Medical History:  Diagnosis Date  . Acquired renal cyst of right kidney   . Anxiety   . Atrial fibrillation and flutter ([place])    s/p PVI and CTI by Dr Rayann Heman 04/2013  . Blood transfusion   . Cataract   . CHEST WALL PAIN, HX OF 07/28/2007   Qualifier: Diagnosis of  By: Julien Girt CMA, [name]    . Diverticulosis of colon   . DJD (degenerative joint disease) 10/18/2011  . Erysipelas   . Fibromyalgia   . FIBROMYALGIA 07/28/2007   Qualifier: Diagnosis of  By: Julien Girt CMA, [name]    . GERD (gastroesophageal reflux disease)   . Hiatal hernia   . Hot flashes - on low dose HRT with gynecologist, Dr. Nori Riis 03/31/2014  . Hx of colonic polyps   . Hyperlipidemia   . MITRAL VALVE PROLAPSE 07/28/2007   Qualifier: Diagnosis of  By: Julien Girt CMA, [name]    . PONV (postoperative nausea and vomiting)   . Supraventricular tachycardia ([place])    AV nodal reentry  s/p RFCA 2001  . Thyroid disease     Past Surgical History:  Procedure Laterality Date  . ABDOMINAL HYSTERECTOMY     heavy menses, prolonged periods. no cancer. cervix remained.   . ATRIAL FIBRILLATION ABLATION N/A 04/30/2013   Procedure: ATRIAL FIBRILLATION ABLATION;  Surgeon: [name], MD;  Location: [facility] CATH LAB;   Service: Cardiovascular;  Laterality: N/A;PVI and CTI by Dr Rayann Heman   . BREAST BIOPSY Left    benign  . BREAST BIOPSY  2011   benign  . CATARACT EXTRACTION BILATERAL W/ ANTERIOR VITRECTOMY    . COLONOSCOPY    . ESOPHAGOGASTRODUODENOSCOPY    . KNEE ARTHROSCOPY Right 2012  . KNEE SURGERY Right 08/22/2018  . LOOP RECORDER INSERTION N/A 05/24/2017   Procedure: LOOP RECORDER INSERTION;  Surgeon: [name], MD;  Location: [place] CV LAB;  Service: Cardiovascular;  Laterality: N/A;  . OOPHORECTOMY     done for cyst on ovaries.   [name] Kitchen ROTATOR CUFF REPAIR  08/2001   Dr. Gladstone Lighter  . SVT ABLATION N/A 07/20/2017   Procedure: SVT ABLATION;  Surgeon: [name], MD;  Location: [place] CV LAB;  Service: Cardiovascular;  Laterality: N/A;  . TEE WITHOUT CARDIOVERSION N/A 04/29/2013   Procedure: TRANSESOPHAGEAL ECHOCARDIOGRAM (TEE);  Surgeon: Jettie Booze, MD;  Location: [place];  Service: Cardiovascular;  Laterality: N/A;  . TOTAL KNEE ARTHROPLASTY Right 06/10/2019   Procedure: TOTAL KNEE ARTHROPLASTY;  Surgeon: Gaynelle Arabian, MD;  Location: [facility];  Service: Orthopedics;  Laterality: Right;  . UPPER GASTROINTESTINAL ENDOSCOPY      There were no vitals filed for  this visit.  Subjective Assessment - 06/28/19 1000    Subjective  Ok except at night, her Rt thigh ( proximal quad) really aches at night. MD gave her medication for this but it hasn't helped to much.    Currently in Pain?  Yes    Pain Score  2     Pain Location  Knee    Pain Orientation  Right    Pain Descriptors / Indicators  Dull;Aching    Multiple Pain Sites  No         [facility] PT Assessment - 06/28/19 0001      ROM / Strength   AROM / PROM / Strength  --   Active flexion 103     PROM   Right Knee Extension  13    Right Knee Flexion  --                   [facility] Adult PT Treatment/Exercise - 06/28/19 0001      Knee/Hip Exercises: Aerobic   Nustep  L2 x 10 min PTA present to discuss status       Knee/Hip Exercises: Standing   Forward Step Up  Right;2 sets;10 reps;Step Height: 2";Step Height: 6"    SLS  Rt with LTLE tapping the 4 " box no UE 2x10      Knee/Hip Exercises: Seated   Knee/Hip Flexion  Floor slider to assist    2x50     Knee/Hip Exercises: Supine   Short Arc Quad Sets  AROM;Strengthening;3 sets;10 reps    Heel Slides  AROM;Right;20 reps   VC to not use her "back" to bend her knee   Other Supine Knee/Hip Exercises  leg press into mat 5 sec 10x,      Vasopneumatic   Number Minutes Vasopneumatic   10 minutes    Vasopnuematic Location   Knee    Vasopneumatic Pressure  Low   Changed to low per pt request   Vasopneumatic Temperature   3 snowflakes      Manual Therapy   Soft tissue mobilization  Rt quad and posterior knee soft tissue work, knee extension stretching to tolerance   Pt with low tolerance to extension work              PT Short Term Goals - 06/14/19 1154      PT SHORT TERM GOAL #1   Title  Pt will demo independence and understanding of her initial HEP in order to increase knee Rom and strength.    Time  4    Period  Weeks    Status  New    Target Date  07/12/19      PT SHORT TERM GOAL #2   Title  Pt will be able to complete sit to stand from a chair with or without UE support and equal weight-bearing through the LEs throughout her session.    Time  4    Period  Weeks    Status  New      PT SHORT TERM GOAL #3   Title  Pt will have atleast 90 deg of Rt knee flexion and lacking no more than 5 deg from full extension.    Time  4    Period  Weeks    Status  New      PT SHORT TERM GOAL #4   Title  Pt will have increased strength in the Rt LE evident by her ability to independently lift her leg onto the  table throughout her session.    Time  4    Period  Weeks    Status  New        PT Long Term Goals - 06/14/19 1155      PT LONG TERM GOAL #1   Title  Pt will be able to complete 5x sit to stand in less than 14 sec which will  reflect improvements in functional strength.    Time  8    Period  Weeks    Status  New    Target Date  08/09/19      PT LONG TERM GOAL #2   Title  Pt will be able to ascend and descend clinic steps atleast 2/3 trials with no more than 1 handrail and reciprocal pattern.    Time  8    Period  Weeks    Status  New      PT LONG TERM GOAL #3   Title  Pt will have atleast 110 deg of Rt active knee flexion full extension to improve walking mechanics and efficiency with daily tasks.    Time  8    Period  Weeks    Status  New      PT LONG TERM GOAL #4   Title  Pt will be able to complete TUG with LRAD in less than 20 sec to reflect improvements in her mobility.    Time  8    Period  Weeks    Status  New            Plan - 06/28/19 0957    Clinical Impression Statement  Pt arrives today with not much pain ambulating with her RW. . We continueto practice walking with her [place]. Pt demonstrates good safety and stabilization as she walks with the cane. She reports she feels comfortable doing this in her home. She complains of night time quad pain. We discussed there might be a stroong correlation to the weakness of her quad muscle at this time. Knee flexion continues to improve gradually but active extension remains limited/unchanged. Pt does have passive extension to 13 today.    Personal Factors and Comorbidities  Age;Fitness    Examination-Activity Limitations  Squat;Stairs    Stability/Clinical Decision Making  Stable/Uncomplicated    Rehab Potential  Good    PT Frequency  2x / week    PT Duration  8 weeks    PT Treatment/Interventions  ADLs/Self Care Home Management;Electrical Stimulation;Therapeutic activities;Therapeutic exercise;Neuromuscular re-education;Balance training;Patient/family education;Manual techniques;Taping;Dry needling;Passive range of motion;Scar mobilization    PT Home Exercise Plan  HEP from hospital: quad sets, heel slides (seated and supine), long arc quads,  ankle pumps   Access Code: [email or web address]    Consulted and Agree with Plan of Care  Patient       Patient will benefit from skilled therapeutic intervention in order to improve the following deficits and impairments:  Abnormal gait, Decreased balance, Decreased endurance, Decreased mobility, Difficulty walking, Decreased strength, Decreased activity tolerance, Decreased range of motion, Increased edema, Pain, Improper body mechanics, Increased muscle spasms  Visit Diagnosis: Acute pain of right knee  Stiffness of right knee, not elsewhere classified  Other abnormalities of gait and mobility     Problem List Patient Active Problem List   Diagnosis Date Noted  . OA (osteoarthritis) of knee 06/10/2019  . Tachyarrhythmia 02/14/2018  . Left hip pain 03/28/2016  . Atrial fibrillation ([place]) 03/31/2014  . Hot flashes - on low dose HRT with gynecologist, Dr.  Nori Riis 03/31/2014  . Hypothyroidism 07/28/2007    Favor Kreh, PTA 06/28/2019, 10:54 AM  [facility] [address] [place], [place], [address] Phone: [phone]   Fax:  [phone]  Name: Marissa Hart MRN: [id number] Date of Birth: [birth year]  Access Code: [email or web address]  URL: [email or web address]  Date: 06/28/2019  Prepared by: Myrene Galas   Exercises  Seated Knee Extension Stretch with Chair - 3 reps - 60 hold - 2x daily - 7x weekly  Supine Knee Extension Stretch on Towel Roll - 10 reps - 1 sets - 30 hold - 2x daily - 7x weekly

## 2019-07-01 ENCOUNTER — Other Ambulatory Visit: Payer: Self-pay

## 2019-07-01 ENCOUNTER — Ambulatory Visit: Payer: Medicare Other | Admitting: Physical Therapy

## 2019-07-01 ENCOUNTER — Encounter: Payer: Self-pay | Admitting: Physical Therapy

## 2019-07-01 DIAGNOSIS — M25561 Pain in right knee: Secondary | ICD-10-CM | POA: Diagnosis not present

## 2019-07-01 DIAGNOSIS — R2689 Other abnormalities of gait and mobility: Secondary | ICD-10-CM

## 2019-07-01 DIAGNOSIS — M25661 Stiffness of right knee, not elsewhere classified: Secondary | ICD-10-CM

## 2019-07-01 NOTE — Therapy (Signed)
[facility] [address] [place], [place], [address] Phone: [phone]   Fax:  [phone]  Physical Therapy Treatment  Patient Details  Name: Marissa Hart MRN: [id number] Date of Birth: 11-28-1934 Referring Provider (PT): Ardeen Jourdain, [place]   Encounter Date: 07/01/2019  PT End of Session - 07/01/19 1529    Visit Number  8    Date for PT Re-Evaluation  08/09/19    Authorization Type  BCBS Medicare    Authorization Time Period  06/14/19 to 08/09/19    PT Start Time  1515    PT Stop Time  1611    PT Time Calculation (min)  56 min    Activity Tolerance  Patient tolerated treatment well    Behavior During Therapy  [facility] for tasks assessed/performed       Past Medical History:  Diagnosis Date  . Acquired renal cyst of right kidney   . Anxiety   . Atrial fibrillation and flutter ([place])    s/p PVI and CTI by Dr Rayann Heman 04/2013  . Blood transfusion   . Cataract   . CHEST WALL PAIN, HX OF 07/28/2007   Qualifier: Diagnosis of  By: Julien Girt CMA, [name]    . Diverticulosis of colon   . DJD (degenerative joint disease) 10/18/2011  . Erysipelas   . Fibromyalgia   . FIBROMYALGIA 07/28/2007   Qualifier: Diagnosis of  By: Julien Girt CMA, [name]    . GERD (gastroesophageal reflux disease)   . Hiatal hernia   . Hot flashes - on low dose HRT with gynecologist, Dr. Nori Riis 03/31/2014  . Hx of colonic polyps   . Hyperlipidemia   . MITRAL VALVE PROLAPSE 07/28/2007   Qualifier: Diagnosis of  By: Julien Girt CMA, [name]    . PONV (postoperative nausea and vomiting)   . Supraventricular tachycardia ([place])    AV nodal reentry  s/p RFCA 2001  . Thyroid disease     Past Surgical History:  Procedure Laterality Date  . ABDOMINAL HYSTERECTOMY     heavy menses, prolonged periods. no cancer. cervix remained.   . ATRIAL FIBRILLATION ABLATION N/A 04/30/2013   Procedure: ATRIAL FIBRILLATION ABLATION;  Surgeon: [name], MD;  Location: [facility] CATH LAB;   Service: Cardiovascular;  Laterality: N/A;PVI and CTI by Dr Rayann Heman   . BREAST BIOPSY Left    benign  . BREAST BIOPSY  2011   benign  . CATARACT EXTRACTION BILATERAL W/ ANTERIOR VITRECTOMY    . COLONOSCOPY    . ESOPHAGOGASTRODUODENOSCOPY    . KNEE ARTHROSCOPY Right 2012  . KNEE SURGERY Right 08/22/2018  . LOOP RECORDER INSERTION N/A 05/24/2017   Procedure: LOOP RECORDER INSERTION;  Surgeon: [name], MD;  Location: [place] CV LAB;  Service: Cardiovascular;  Laterality: N/A;  . OOPHORECTOMY     done for cyst on ovaries.   [name] Kitchen ROTATOR CUFF REPAIR  08/2001   Dr. Gladstone Lighter  . SVT ABLATION N/A 07/20/2017   Procedure: SVT ABLATION;  Surgeon: [name], MD;  Location: [place] CV LAB;  Service: Cardiovascular;  Laterality: N/A;  . TEE WITHOUT CARDIOVERSION N/A 04/29/2013   Procedure: TRANSESOPHAGEAL ECHOCARDIOGRAM (TEE);  Surgeon: Jettie Booze, MD;  Location: [place];  Service: Cardiovascular;  Laterality: N/A;  . TOTAL KNEE ARTHROPLASTY Right 06/10/2019   Procedure: TOTAL KNEE ARTHROPLASTY;  Surgeon: Gaynelle Arabian, MD;  Location: [facility];  Service: Orthopedics;  Laterality: Right;  . UPPER GASTROINTESTINAL ENDOSCOPY      There were no vitals filed for  this visit.  Subjective Assessment - 07/01/19 1530    Subjective  Not terrible after last session. I'm doing ok.    Currently in Pain?  Yes    Pain Score  2     Pain Location  Knee   Mainly at night thigh hurts   Pain Orientation  Right    Pain Descriptors / Indicators  Dull;Aching   knee   Aggravating Factors   At night    Pain Relieving Factors  ice, meds    Multiple Pain Sites  No         [facility] PT Assessment - 07/01/19 0001      AROM   Overall AROM   --   AROM post manual knee ext 13 degrees     PROM   Right Knee Extension  10   post manual work                  [employer] Adult PT Treatment/Exercise - 07/01/19 0001      Knee/Hip Exercises: Stretches   Gastroc Stretch  Right;2 reps;20  seconds    Gastroc Stretch Limitations  On slant board      Knee/Hip Exercises: Aerobic   Nustep  L2 x 6 min   PTA present     Knee/Hip Exercises: Standing   Heel Raises  Both;1 set;10 reps    Hip Abduction  AROM;Stengthening;Both;1 set;10 reps;Knee straight    SLS  Rt with LTLE tapping the 6 " box no UE 2x10      Knee/Hip Exercises: Seated   Long Arc Quad  AROM;Right;2 sets;10 reps    Knee/Hip Flexion  Floor slider to assist    2x50     Vasopneumatic   Number Minutes Vasopneumatic   10 minutes    Vasopnuematic Location   Knee    Vasopneumatic Pressure  Low   Changed to low per pt request   Vasopneumatic Temperature   3 snowflakes      Manual Therapy   Soft tissue mobilization  Rt quad and posterior knee soft tissue work, knee extension stretching to tolerance   Pt with low tolerance to extension work            PT Education - 07/01/19 1602    Education Details  HEP addition: standing hip abduction, gastroc stretch, and hip flexor stretch    Person(s) Educated  Patient    Methods  Explanation;Demonstration;Tactile cues;Verbal cues;Handout    Comprehension  Returned demonstration;Verbalized understanding       PT Short Term Goals - 07/01/19 1608      PT SHORT TERM GOAL #1   Title  Pt will demo independence and understanding of her initial HEP in order to increase knee Rom and strength.    Time  4    Period  Weeks    Status  Achieved    Target Date  07/12/19      PT SHORT TERM GOAL #3   Title  Pt will have atleast 90 deg of Rt knee flexion and lacking no more than 5 deg from full extension.    Period  Weeks    Status  Partially Met      PT SHORT TERM GOAL #4   Title  Pt will have increased strength in the Rt LE evident by her ability to independently lift her leg onto the table throughout her session.    Time  4    Period  Weeks    Status  Achieved  PT Long Term Goals - 06/14/19 1155      PT LONG TERM GOAL #1   Title  Pt will be able to  complete 5x sit to stand in less than 14 sec which will reflect improvements in functional strength.    Time  8    Period  Weeks    Status  New    Target Date  08/09/19      PT LONG TERM GOAL #2   Title  Pt will be able to ascend and descend clinic steps atleast 2/3 trials with no more than 1 handrail and reciprocal pattern.    Time  8    Period  Weeks    Status  New      PT LONG TERM GOAL #3   Title  Pt will have atleast 110 deg of Rt active knee flexion full extension to improve walking mechanics and efficiency with daily tasks.    Time  8    Period  Weeks    Status  New      PT LONG TERM GOAL #4   Title  Pt will be able to complete TUG with LRAD in less than 20 sec to reflect improvements in her mobility.    Time  8    Period  Weeks    Status  New            Plan - 07/01/19 1529    Clinical Impression Statement  Pt arrives with just a little medila knee pain. Pt was given Rt hip flexor stretch for night time thigh pain. Soft tissue work and release work seemed to help pt get her knee straighter. Pt is ready to ambulate household distances and shorter community distances with her cane as her quad is demonstrating improved strength and control.    Personal Factors and Comorbidities  Age;Fitness    Examination-Activity Limitations  Squat;Stairs    Stability/Clinical Decision Making  Stable/Uncomplicated    Rehab Potential  Good    PT Frequency  2x / week    PT Duration  8 weeks    PT Treatment/Interventions  ADLs/Self Care Home Management;Electrical Stimulation;Therapeutic activities;Therapeutic exercise;Neuromuscular re-education;Balance training;Patient/family education;Manual techniques;Taping;Dry needling;Passive range of motion;Scar mobilization    PT Next Visit Plan  RT hip flexor soft tissue work, release work, stretch into knee extension, quad strength    PT Home Exercise Plan  HEP from hospital: quad sets, heel slides (seated and supine), long arc quads, ankle pumps    Access Code: [email or web address]    Consulted and Agree with Plan of Care  Patient       Patient will benefit from skilled therapeutic intervention in order to improve the following deficits and impairments:  Abnormal gait, Decreased balance, Decreased endurance, Decreased mobility, Difficulty walking, Decreased strength, Decreased activity tolerance, Decreased range of motion, Increased edema, Pain, Improper body mechanics, Increased muscle spasms  Visit Diagnosis: Acute pain of right knee  Stiffness of right knee, not elsewhere classified  Other abnormalities of gait and mobility     Problem List Patient Active Problem List   Diagnosis Date Noted  . OA (osteoarthritis) of knee 06/10/2019  . Tachyarrhythmia 02/14/2018  . Left hip pain 03/28/2016  . Atrial fibrillation ([place]) 03/31/2014  . Hot flashes - on low dose HRT with gynecologist, Dr. Nori Riis 03/31/2014  . Hypothyroidism 07/28/2007    Gargi Berch, PTA 07/01/2019, 4:13 PM  [facility] [address], [place], [place], [address] Phone: [phone]  Fax:  [phone]  Name: JEN BENEDICT MRN: [id number] Date of Birth: November 01, 1934  Access Code: [email or web address]  URL: [email or web address]  Date: 07/01/2019  Prepared by: Myrene Galas   Exercises  Seated Knee Extension Stretch with Chair - 3 reps - 60 hold - 2x daily - 7x weekly  Supine Knee Extension Stretch on Towel Roll - 10 reps - 1 sets - 30 hold - 2x daily - 7x weekly  Standing Hip Abduction with Counter Support - 10 reps - 2 sets - 2x daily - 7x weekly  Standing Gastroc Stretch - 5 reps - 1 sets - 20 hold - 2x daily - 7x weekly  Modified Thomas Stretch - 10 reps - 3 sets - 1x daily - 7x weekly

## 2019-07-03 ENCOUNTER — Other Ambulatory Visit: Payer: Self-pay

## 2019-07-03 ENCOUNTER — Ambulatory Visit: Payer: Medicare Other

## 2019-07-03 DIAGNOSIS — M25561 Pain in right knee: Secondary | ICD-10-CM

## 2019-07-03 DIAGNOSIS — M25661 Stiffness of right knee, not elsewhere classified: Secondary | ICD-10-CM

## 2019-07-03 DIAGNOSIS — R2689 Other abnormalities of gait and mobility: Secondary | ICD-10-CM | POA: Diagnosis not present

## 2019-07-03 NOTE — Therapy (Signed)
[facility] [address], [place], [place], [address] Phone: [phone]   Fax:  [phone]  Physical Therapy Treatment  Patient Details  Name: Marissa Hart MRN: [id number] Date of Birth: 15-Jul-1935 Referring Provider (PT): Ardeen Jourdain, [place]   Encounter Date: 07/03/2019 Progress Note Reporting Period  06/14/2019 to 07/03/2019  See note below for Objective Data and Assessment of Progress/Goals.      PT End of Session - 07/03/19 1623    Visit Number  9   progress note due on visit 19   Date for PT Re-Evaluation  08/09/19    Authorization Type  BCBS Medicare    PT Start Time  1530    PT Stop Time  1624    PT Time Calculation (min)  54 min    Activity Tolerance  Patient tolerated treatment well    Behavior During Therapy  WFL for tasks assessed/performed       Past Medical History:  Diagnosis Date  . Acquired renal cyst of right kidney   . Anxiety   . Atrial fibrillation and flutter ([place])    s/p PVI and CTI by Dr Rayann Heman 04/2013  . Blood transfusion   . Cataract   . CHEST WALL PAIN, HX OF 07/28/2007   Qualifier: Diagnosis of  By: Julien Girt CMA, [name]    . Diverticulosis of colon   . DJD (degenerative joint disease) 10/18/2011  . Erysipelas   . Fibromyalgia   . FIBROMYALGIA 07/28/2007   Qualifier: Diagnosis of  By: Julien Girt CMA, [name]    . GERD (gastroesophageal reflux disease)   . Hiatal hernia   . Hot flashes - on low dose HRT with gynecologist, Dr. Nori Riis 03/31/2014  . Hx of colonic polyps   . Hyperlipidemia   . MITRAL VALVE PROLAPSE 07/28/2007   Qualifier: Diagnosis of  By: Julien Girt CMA, [name]    . PONV (postoperative nausea and vomiting)   . Supraventricular tachycardia ([place])    AV nodal reentry  s/p RFCA 2001  . Thyroid disease     Past Surgical History:  Procedure Laterality Date  . ABDOMINAL HYSTERECTOMY     heavy menses, prolonged periods. no cancer. cervix remained.   . ATRIAL FIBRILLATION  ABLATION N/A 04/30/2013   Procedure: ATRIAL FIBRILLATION ABLATION;  Surgeon: [name], MD;  Location: [facility] CATH LAB;  Service: Cardiovascular;  Laterality: N/A;PVI and CTI by Dr Rayann Heman   . BREAST BIOPSY Left    benign  . BREAST BIOPSY  2011   benign  . CATARACT EXTRACTION BILATERAL W/ ANTERIOR VITRECTOMY    . COLONOSCOPY    . ESOPHAGOGASTRODUODENOSCOPY    . KNEE ARTHROSCOPY Right 2012  . KNEE SURGERY Right 08/22/2018  . LOOP RECORDER INSERTION N/A 05/24/2017   Procedure: LOOP RECORDER INSERTION;  Surgeon: [name], MD;  Location: [place] CV LAB;  Service: Cardiovascular;  Laterality: N/A;  . OOPHORECTOMY     done for cyst on ovaries.   [name] Kitchen ROTATOR CUFF REPAIR  08/2001   Dr. Gladstone Lighter  . SVT ABLATION N/A 07/20/2017   Procedure: SVT ABLATION;  Surgeon: [name], MD;  Location: [place] CV LAB;  Service: Cardiovascular;  Laterality: N/A;  . TEE WITHOUT CARDIOVERSION N/A 04/29/2013   Procedure: TRANSESOPHAGEAL ECHOCARDIOGRAM (TEE);  Surgeon: Jettie Booze, MD;  Location: [place] [facility];  Service: Cardiovascular;  Laterality: N/A;  . TOTAL KNEE ARTHROPLASTY Right 06/10/2019   Procedure: TOTAL KNEE ARTHROPLASTY;  Surgeon: Gaynelle Arabian, MD;  Location: [facility];  Service:  Orthopedics;  Laterality: Right;  . UPPER GASTROINTESTINAL ENDOSCOPY      There were no vitals filed for this visit.  Subjective Assessment - 07/03/19 1526    Subjective  I am using the cane most of the time.    Currently in Pain?  Yes    Pain Score  2     Pain Location  Knee    Pain Orientation  Right    Pain Descriptors / Indicators  Dull;Aching    Pain Type  Surgical pain    Pain Onset  1 to 4 weeks ago    Pain Frequency  Intermittent    Aggravating Factors   when I first get up, at night, with stretching    Pain Relieving Factors  ice, medication         [facility] PT Assessment - 07/03/19 0001      Assessment   Medical Diagnosis  Rt TKA    Referring Provider (PT)  Ardeen Jourdain, PA-C    Onset  Date/Surgical Date  06/10/19      PROM   Right Knee Extension  10   post manual work   Right Knee Flexion  100      Transfers   Five time sit to stand comments   22 seconds                   [facility] Adult PT Treatment/Exercise - 07/03/19 0001      Knee/Hip Exercises: Stretches   Gastroc Stretch  Right;2 reps;20 seconds    Gastroc Stretch Limitations  On slant board      Knee/Hip Exercises: Aerobic   Stationary Bike  Level 0 for flexibility x 6 minutes    Nustep  L2 x 6 min   PT present     Knee/Hip Exercises: Standing   Heel Raises  Both;1 set;10 reps    Forward Step Up  Right;2 sets;10 reps;Step Height: 2";Step Height: 6"    Forward Step Up Limitations  verbal cues to reduce UE support    Rebounder  weightshifting 3 ways x 1 minute each      Knee/Hip Exercises: Seated   Knee/Hip Flexion  Floor slider to assist    50 reps     Vasopneumatic   Number Minutes Vasopneumatic   10 minutes    Vasopnuematic Location   Knee    Vasopneumatic Pressure  Low   Changed to low per pt request   Vasopneumatic Temperature   3 snowflakes      Manual Therapy   Soft tissue mobilization  Rt quad and posterior knee soft tissue work, knee extension stretching to tolerance   Pt with low tolerance to extension work              PT Short Term Goals - 07/03/19 1553      PT SHORT TERM GOAL #1   Title  Pt will demo independence and understanding of her initial HEP in order to increase knee Rom and strength.    Status  Achieved      PT SHORT TERM GOAL #2   Title  Pt will be able to complete sit to stand from a chair with or without UE support and equal weight-bearing through the LEs throughout her session.    Baseline  Lt>Rt weightbearing due to Lt knee flexion    Time  4    Period  Weeks    Status  On-going      PT SHORT TERM GOAL #3  Title  Pt will have atleast 90 deg of Rt knee flexion and lacking no more than 5 deg from full extension.    Baseline  -10 to 100     Time  4    Period  Weeks    Status  On-going      PT SHORT TERM GOAL #4   Title  Pt will have increased strength in the Rt LE evident by her ability to independently lift her leg onto the table throughout her session.    Status  Achieved        PT Long Term Goals - 07/03/19 1552      PT LONG TERM GOAL #1   Title  Pt will be able to complete 5x sit to stand in less than 14 sec which will reflect improvements in functional strength.    Baseline  22 seconds    Time  8    Period  Weeks    Status  On-going      PT LONG TERM GOAL #2   Title  Pt will be able to ascend and descend clinic steps atleast 2/3 trials with no more than 1 handrail and reciprocal pattern.    Baseline  moderate UE support bilateally with ascending    Time  8    Period  Weeks    Status  On-going      PT LONG TERM GOAL #3   Title  Pt will have atleast 110 deg of Rt active knee flexion full extension to improve walking mechanics and efficiency with daily tasks.    Baseline  100    Time  8    Period  Weeks    Status  On-going      PT LONG TERM GOAL #4   Title  Pt will be able to complete TUG with LRAD in less than 20 sec to reflect improvements in her mobility.    Time  8    Period  Weeks    Status  On-going            Plan - 07/03/19 1619    Clinical Impression Statement  Pt is ambulating most distances with her cane.  Pt continues to have limited extension (lacking 10 degrees actively), making heel strike and symmetry with gait difficult.  Pt requires verbal cues to reduce hip adduction with heel slides and hamstring stretches.  Pt with improved 5x sit to stand time indicating a reduced falls risk.  Pt requires moderate UE support with ascending steps.  Pt will continue to benefit from skilled PT to improve Rt knee A/ROM and strength to improve and normalize gait, improve endurance for community ambulation and to reduce edema and pain.    PT Frequency  2x / week    PT Duration  8 weeks    PT  Treatment/Interventions  ADLs/Self Care Home Management;Electrical Stimulation;Therapeutic activities;Therapeutic exercise;Neuromuscular re-education;Balance training;Patient/family education;Manual techniques;Taping;Dry needling;Passive range of motion;Scar mobilization    PT Next Visit Plan  work on Rt knee extension, gait training, flexibility and strength    Recommended Other Services  initial cert is signed    Consulted and Agree with Plan of Care  Patient       Patient will benefit from skilled therapeutic intervention in order to improve the following deficits and impairments:  Abnormal gait, Decreased balance, Decreased endurance, Decreased mobility, Difficulty walking, Decreased strength, Decreased activity tolerance, Decreased range of motion, Increased edema, Pain, Improper body mechanics, Increased muscle spasms  Visit Diagnosis: Acute  pain of right knee  Stiffness of right knee, not elsewhere classified  Other abnormalities of gait and mobility     Problem List Patient Active Problem List   Diagnosis Date Noted  . OA (osteoarthritis) of knee 06/10/2019  . Tachyarrhythmia 02/14/2018  . Left hip pain 03/28/2016  . Atrial fibrillation ([place]) 03/31/2014  . Hot flashes - on low dose HRT with gynecologist, Dr. Nori Riis 03/31/2014  . Hypothyroidism 07/28/2007     Sigurd Sos, PT 07/03/19 4:26 PM  [facility] [address] [place], [place], [address] Phone: [phone]   Fax:  [phone]  Name: Marissa Hart MRN: [id number] Date of Birth: 07-Sep-1935

## 2019-07-05 ENCOUNTER — Other Ambulatory Visit: Payer: Self-pay

## 2019-07-05 ENCOUNTER — Ambulatory Visit: Payer: Medicare Other | Admitting: Physical Therapy

## 2019-07-05 ENCOUNTER — Encounter: Payer: Self-pay | Admitting: Physical Therapy

## 2019-07-05 DIAGNOSIS — R2689 Other abnormalities of gait and mobility: Secondary | ICD-10-CM | POA: Diagnosis not present

## 2019-07-05 DIAGNOSIS — M25661 Stiffness of right knee, not elsewhere classified: Secondary | ICD-10-CM

## 2019-07-05 DIAGNOSIS — M25561 Pain in right knee: Secondary | ICD-10-CM

## 2019-07-05 NOTE — Therapy (Signed)
[facility] [address] [place], [place], [address] Phone: [phone]   Fax:  [phone]  Physical Therapy Treatment  Patient Details  Name: Marissa Hart MRN: [id number] Date of Birth: [birth year] Referring Provider (PT): Ardeen Jourdain, [place]   Encounter Date: 07/05/2019  PT End of Session - 07/05/19 1107    Visit Number  10    Date for PT Re-Evaluation  08/09/19    Authorization Type  BCBS Medicare    Authorization Time Period  06/14/19 to 08/09/19    PT Start Time  1015    PT Stop Time  1120    PT Time Calculation (min)  65 min    Activity Tolerance  Patient tolerated treatment well   Manual work is uncomfortable for pt   Behavior During Therapy  WFL for tasks assessed/performed       Past Medical History:  Diagnosis Date  . Acquired renal cyst of right kidney   . Anxiety   . Atrial fibrillation and flutter ([place])    s/p PVI and CTI by Dr Rayann Heman 04/2013  . Blood transfusion   . Cataract   . CHEST WALL PAIN, HX OF 07/28/2007   Qualifier: Diagnosis of  By: Julien Girt CMA, [name]    . Diverticulosis of colon   . DJD (degenerative joint disease) 10/18/2011  . Erysipelas   . Fibromyalgia   . FIBROMYALGIA 07/28/2007   Qualifier: Diagnosis of  By: Julien Girt CMA, [name]    . GERD (gastroesophageal reflux disease)   . Hiatal hernia   . Hot flashes - on low dose HRT with gynecologist, Dr. Nori Riis 03/31/2014  . Hx of colonic polyps   . Hyperlipidemia   . MITRAL VALVE PROLAPSE 07/28/2007   Qualifier: Diagnosis of  By: Julien Girt CMA, [name]    . PONV (postoperative nausea and vomiting)   . Supraventricular tachycardia ([place])    AV nodal reentry  s/p RFCA 2001  . Thyroid disease     Past Surgical History:  Procedure Laterality Date  . ABDOMINAL HYSTERECTOMY     heavy menses, prolonged periods. no cancer. cervix remained.   . ATRIAL FIBRILLATION ABLATION N/A 04/30/2013   Procedure: ATRIAL FIBRILLATION ABLATION;  Surgeon:  [name], MD;  Location: [facility] CATH LAB;  Service: Cardiovascular;  Laterality: N/A;PVI and CTI by Dr Rayann Heman   . BREAST BIOPSY Left    benign  . BREAST BIOPSY  2011   benign  . CATARACT EXTRACTION BILATERAL W/ ANTERIOR VITRECTOMY    . COLONOSCOPY    . ESOPHAGOGASTRODUODENOSCOPY    . KNEE ARTHROSCOPY Right 2012  . KNEE SURGERY Right 08/22/2018  . LOOP RECORDER INSERTION N/A 05/24/2017   Procedure: LOOP RECORDER INSERTION;  Surgeon: [name], MD;  Location: [place] [facility];  Service: Cardiovascular;  Laterality: N/A;  . OOPHORECTOMY     done for cyst on ovaries.   [name] Kitchen ROTATOR CUFF REPAIR  08/2001   Dr. Gladstone Lighter  . SVT ABLATION N/A 07/20/2017   Procedure: SVT ABLATION;  Surgeon: [name], MD;  Location: [place] [facility];  Service: Cardiovascular;  Laterality: N/A;  . TEE WITHOUT CARDIOVERSION N/A 04/29/2013   Procedure: TRANSESOPHAGEAL ECHOCARDIOGRAM (TEE);  Surgeon: Jettie Booze, MD;  Location: [place];  Service: Cardiovascular;  Laterality: N/A;  . TOTAL KNEE ARTHROPLASTY Right 06/10/2019   Procedure: TOTAL KNEE ARTHROPLASTY;  Surgeon: Gaynelle Arabian, MD;  Location[facility];  Service: Orthopedics;  Laterality: Right;  . UPPER GASTROINTESTINAL ENDOSCOPY  There were no vitals filed for this visit.  Subjective Assessment - 07/05/19 1026    Subjective  My only complaint is medial knee pain: intermittent    Pertinent History  mitral valve prolapse, a-fib    Currently in Pain?  Yes    Pain Score  2     Pain Location  Knee    Pain Orientation  Right;Medial    Pain Descriptors / Indicators  Sore    Multiple Pain Sites  No         [facility] PT Assessment - 07/05/19 0001      PROM   Right Knee Extension  5                   [facility] Adult PT Treatment/Exercise - 07/05/19 0001      Knee/Hip Exercises: Stretches   Active Hamstring Stretch  Right;3 reps;10 seconds    Active Hamstring Stretch Limitations  Supine with strap       Knee/Hip Exercises:  Aerobic   Nustep  L2 x 10 min   PT present     Knee/Hip Exercises: Machines for Strengthening   Cybex Leg Press  Seat 6 Bil LE40# 2x10 VC for control      Knee/Hip Exercises: Standing   Forward Step Up  Right;2 sets;10 reps;Step Height: 2";Step Height: 6"    Rebounder  weightshifting 3 ways x 1 minute each      Knee/Hip Exercises: Seated   Long Arc Quad  AROM;Right;2 sets;10 reps    Knee/Hip Flexion  Floor slider to assist    50 reps     Knee/Hip Exercises: Supine   Short Arc Quad Sets  Strengthening;Right;3 sets;10 reps      Vasopneumatic   Number Minutes Vasopneumatic   10 minutes    Vasopnuematic Location   Knee    Vasopneumatic Pressure  Low   Changed to low per pt request   Vasopneumatic Temperature   3 snowflakes      Manual Therapy   Soft tissue mobilization  Rt quad and posterior knee soft tissue work, knee extension stretching to tolerance   Pt with low tolerance to extension work              PT Short Term Goals - 07/03/19 1553      PT SHORT TERM GOAL #1   Title  Pt will demo independence and understanding of her initial HEP in order to increase knee Rom and strength.    Status  Achieved      PT SHORT TERM GOAL #2   Title  Pt will be able to complete sit to stand from a chair with or without UE support and equal weight-bearing through the LEs throughout her session.    Baseline  Lt>Rt weightbearing due to Lt knee flexion    Time  4    Period  Weeks    Status  On-going      PT SHORT TERM GOAL #3   Title  Pt will have atleast 90 deg of Rt knee flexion and lacking no more than 5 deg from full extension.    Baseline  -10 to 100    Time  4    Period  Weeks    Status  On-going      PT SHORT TERM GOAL #4   Title  Pt will have increased strength in the Rt LE evident by her ability to independently lift her leg onto the table throughout her session.  Status  Achieved        PT Long Term Goals - 07/03/19 1552      PT LONG TERM GOAL #1   Title   Pt will be able to complete 5x sit to stand in less than 14 sec which will reflect improvements in functional strength.    Baseline  22 seconds    Time  8    Period  Weeks    Status  On-going      PT LONG TERM GOAL #2   Title  Pt will be able to ascend and descend clinic steps atleast 2/3 trials with no more than 1 handrail and reciprocal pattern.    Baseline  moderate UE support bilateally with ascending    Time  8    Period  Weeks    Status  On-going      PT LONG TERM GOAL #3   Title  Pt will have atleast 110 deg of Rt active knee flexion full extension to improve walking mechanics and efficiency with daily tasks.    Baseline  100    Time  8    Period  Weeks    Status  On-going      PT LONG TERM GOAL #4   Title  Pt will be able to complete TUG with LRAD in less than 20 sec to reflect improvements in her mobility.    Time  8    Period  Weeks    Status  On-going            Plan - 07/05/19 1027    Clinical Impression Statement  Pt arrives with mild medial knee pain. The Tramadol is helping her nighttime hip/proximal quad pain. ROM improving since eval, extension improving at a slower rate. Pt does report her knee was not straight for a long time prior to her surgery. Post stretching and joint mobs for extension we were able to achieve 5 degrees PROm for extension at end of session.    Personal Factors and Comorbidities  Age;Fitness    Examination-Activity Limitations  Squat;Stairs    Stability/Clinical Decision Making  Stable/Uncomplicated    Rehab Potential  Good    PT Frequency  2x / week    PT Duration  8 weeks    PT Treatment/Interventions  ADLs/Self Care Home Management;Electrical Stimulation;Therapeutic activities;Therapeutic exercise;Neuromuscular re-education;Balance training;Patient/family education;Manual techniques;Taping;Dry needling;Passive range of motion;Scar mobilization    PT Next Visit Plan  work on Rt knee extension, gait training, flexibility and strength     PT Home Exercise Plan  HEP from hospital: quad sets, heel slides (seated and supine), long arc quads, ankle pumps   Access Code: [email or web address]       Patient will benefit from skilled therapeutic intervention in order to improve the following deficits and impairments:  Abnormal gait, Decreased balance, Decreased endurance, Decreased mobility, Difficulty walking, Decreased strength, Decreased activity tolerance, Decreased range of motion, Increased edema, Pain, Improper body mechanics, Increased muscle spasms  Visit Diagnosis: Acute pain of right knee  Stiffness of right knee, not elsewhere classified  Other abnormalities of gait and mobility     Problem List Patient Active Problem List   Diagnosis Date Noted  . OA (osteoarthritis) of knee 06/10/2019  . Tachyarrhythmia 02/14/2018  . Left hip pain 03/28/2016  . Atrial fibrillation ([place]) 03/31/2014  . Hot flashes - on low dose HRT with gynecologist, Dr. Nori Riis 03/31/2014  . Hypothyroidism 07/28/2007    ,, PTA 07/05/2019, 11:09 AM  [facility] [address] [place], [place], [address] Phone: [phone]   Fax:  [phone]  Name: Marissa Hart MRN: [id number] Date of Birth: 1935/07/24

## 2019-07-08 ENCOUNTER — Other Ambulatory Visit: Payer: Self-pay

## 2019-07-08 ENCOUNTER — Ambulatory Visit: Payer: Medicare Other | Admitting: Physical Therapy

## 2019-07-08 ENCOUNTER — Encounter: Payer: Self-pay | Admitting: Physical Therapy

## 2019-07-08 DIAGNOSIS — M25561 Pain in right knee: Secondary | ICD-10-CM

## 2019-07-08 DIAGNOSIS — M25661 Stiffness of right knee, not elsewhere classified: Secondary | ICD-10-CM

## 2019-07-08 DIAGNOSIS — R2689 Other abnormalities of gait and mobility: Secondary | ICD-10-CM | POA: Diagnosis not present

## 2019-07-08 NOTE — Therapy (Signed)
[facility] [address] [place], [place], [address] Phone: [phone]   Fax:  [phone]  Physical Therapy Treatment  Patient Details  Name: Marissa Hart MRN: [id number] Date of Birth: 01-Feb-1935 Referring Provider (PT): Ardeen Jourdain, [place]   Encounter Date: 07/08/2019  PT End of Session - 07/08/19 1535    Visit Number  11    Date for PT Re-Evaluation  08/09/19    Authorization Type  BCBS Medicare    Authorization Time Period  06/14/19 to 08/09/19    PT Start Time  1515    PT Stop Time  1621    PT Time Calculation (min)  66 min    Activity Tolerance  Patient tolerated treatment well    Behavior During Therapy  [facility] for tasks assessed/performed       Past Medical History:  Diagnosis Date  . Acquired renal cyst of right kidney   . Anxiety   . Atrial fibrillation and flutter ([place])    s/p PVI and CTI by Dr Rayann Heman 04/2013  . Blood transfusion   . Cataract   . CHEST WALL PAIN, HX OF 07/28/2007   Qualifier: Diagnosis of  By: Julien Girt CMA, [name]    . Diverticulosis of colon   . DJD (degenerative joint disease) 10/18/2011  . Erysipelas   . Fibromyalgia   . FIBROMYALGIA 07/28/2007   Qualifier: Diagnosis of  By: Julien Girt CMA, [name]    . GERD (gastroesophageal reflux disease)   . Hiatal hernia   . Hot flashes - on low dose HRT with gynecologist, Dr. Nori Riis 03/31/2014  . Hx of colonic polyps   . Hyperlipidemia   . MITRAL VALVE PROLAPSE 07/28/2007   Qualifier: Diagnosis of  By: Julien Girt CMA, [name]    . PONV (postoperative nausea and vomiting)   . Supraventricular tachycardia ([place])    AV nodal reentry  s/p RFCA 2001  . Thyroid disease     Past Surgical History:  Procedure Laterality Date  . ABDOMINAL HYSTERECTOMY     heavy menses, prolonged periods. no cancer. cervix remained.   . ATRIAL FIBRILLATION ABLATION N/A 04/30/2013   Procedure: ATRIAL FIBRILLATION ABLATION;  Surgeon: [name], MD;  Location[facility] CATH  LAB;  Service: Cardiovascular;  Laterality: N/A;PVI and CTI by Dr Rayann Heman   . BREAST BIOPSY Left    benign  . BREAST BIOPSY  2011   benign  . CATARACT EXTRACTION BILATERAL W/ ANTERIOR VITRECTOMY    . COLONOSCOPY    . ESOPHAGOGASTRODUODENOSCOPY    . KNEE ARTHROSCOPY Right 2012  . KNEE SURGERY Right 08/22/2018  . LOOP RECORDER INSERTION N/A 05/24/2017   Procedure: LOOP RECORDER INSERTION;  Surgeon: [name], MD;  Location: [place] CV LAB;  Service: Cardiovascular;  Laterality: N/A;  . OOPHORECTOMY     done for cyst on ovaries.   [name] Kitchen ROTATOR CUFF REPAIR  08/2001   Dr. Gladstone Lighter  . SVT ABLATION N/A 07/20/2017   Procedure: SVT ABLATION;  Surgeon: [name], MD;  Location: [place] CV LAB;  Service: Cardiovascular;  Laterality: N/A;  . TEE WITHOUT CARDIOVERSION N/A 04/29/2013   Procedure: TRANSESOPHAGEAL ECHOCARDIOGRAM (TEE);  Surgeon: Jettie Booze, MD;  Location: [place];  Service: Cardiovascular;  Laterality: N/A;  . TOTAL KNEE ARTHROPLASTY Right 06/10/2019   Procedure: TOTAL KNEE ARTHROPLASTY;  Surgeon: Gaynelle Arabian, MD;  Location: [facility]S;  Service: Orthopedics;  Laterality: Right;  . UPPER GASTROINTESTINAL ENDOSCOPY      There were no vitals filed for  this visit.  Subjective Assessment - 07/08/19 1536    Subjective  My inner knee was really sore after I did my  standing calf stretches.    Pertinent History  mitral valve prolapse, a-fib    Currently in Pain?  Yes    Pain Score  2     Pain Location  Knee    Pain Orientation  Right;Medial    Pain Descriptors / Indicators  Sore    Aggravating Factors   at night mainly    Pain Relieving Factors  ice. medication    Multiple Pain Sites  No         [facility] PT Assessment - 07/08/19 0001      PROM   Right Knee Flexion  110                   [facility] Adult PT Treatment/Exercise - 07/08/19 0001      Knee/Hip Exercises: Aerobic   Nustep  L2 x 10 min   PTA present     Knee/Hip Exercises: Machines for  Strengthening   Cybex Leg Press  Seat 6 BilLE 40# 15# 2x10  2x10 , RTLE       Knee/Hip Exercises: Standing   Forward Step Up  Right;2 sets;10 reps;Step Height: 2";Step Height: 6"      Knee/Hip Exercises: Seated   Long Arc Quad  AROM;Right;3 sets;10 reps    Knee/Hip Flexion  Floor slider to assist    50 reps     Knee/Hip Exercises: Supine   Quad Sets  AROM;Strengthening;Right;2 sets;5 reps    Quad Sets Limitations  VC to Squash my fingers: second set better quad activation    Short Arc Quad Sets  Strengthening;Right;3 sets;10 reps    Bridges  Strengthening;Both;1 set;10 reps      Vasopneumatic   Number Minutes Vasopneumatic   10 minutes    Vasopnuematic Location   Knee    Vasopneumatic Pressure  Low   Changed to low per pt request   Vasopneumatic Temperature   3 snowflakes      Manual Therapy   Soft tissue mobilization  Rt quad and posterior knee soft tissue work, knee extension stretching to tolerance   Pt with low tolerance to extension work              PT Short Term Goals - 07/03/19 1553      PT SHORT TERM GOAL #1   Title  Pt will demo independence and understanding of her initial HEP in order to increase knee Rom and strength.    Status  Achieved      PT SHORT TERM GOAL #2   Title  Pt will be able to complete sit to stand from a chair with or without UE support and equal weight-bearing through the LEs throughout her session.    Baseline  Lt>Rt weightbearing due to Lt knee flexion    Time  4    Period  Weeks    Status  On-going      PT SHORT TERM GOAL #3   Title  Pt will have atleast 90 deg of Rt knee flexion and lacking no more than 5 deg from full extension.    Baseline  -10 to 100    Time  4    Period  Weeks    Status  On-going      PT SHORT TERM GOAL #4   Title  Pt will have increased strength in the Rt LE evident by  her ability to independently lift her leg onto the table throughout her session.    Status  Achieved        PT Long Term Goals -  07/03/19 1552      PT LONG TERM GOAL #1   Title  Pt will be able to complete 5x sit to stand in less than 14 sec which will reflect improvements in functional strength.    Baseline  22 seconds    Time  8    Period  Weeks    Status  On-going      PT LONG TERM GOAL #2   Title  Pt will be able to ascend and descend clinic steps atleast 2/3 trials with no more than 1 handrail and reciprocal pattern.    Baseline  moderate UE support bilateally with ascending    Time  8    Period  Weeks    Status  On-going      PT LONG TERM GOAL #3   Title  Pt will have atleast 110 deg of Rt active knee flexion full extension to improve walking mechanics and efficiency with daily tasks.    Baseline  100    Time  8    Period  Weeks    Status  On-going      PT LONG TERM GOAL #4   Title  Pt will be able to complete TUG with LRAD in less than 20 sec to reflect improvements in her mobility.    Time  8    Period  Weeks    Status  On-going            Plan - 07/08/19 1535    Clinical Impression Statement  Pt arrives with medial knee soreness. Active knee flexion improved to 110 degrees of ROM. Extension after stretching and soft tissue work may have been around 5 degrees but was not measured. Pt does not have a good quad set, she does better when given a tactile cue behind her knee. She was cued verbally to try and straighten her knee more on the leg press today. pt remains compliant with her HEP.    Personal Factors and Comorbidities  Age;Fitness    Examination-Activity Limitations  Squat;Stairs    Stability/Clinical Decision Making  Stable/Uncomplicated    Rehab Potential  Good    PT Frequency  2x / week    PT Duration  8 weeks    PT Treatment/Interventions  ADLs/Self Care Home Management;Electrical Stimulation;Therapeutic activities;Therapeutic exercise;Neuromuscular re-education;Balance training;Patient/family education;Manual techniques;Taping;Dry needling;Passive range of motion;Scar mobilization     PT Home Exercise Plan  HEP from hospital: quad sets, heel slides (seated and supine), long arc quads, ankle pumps   Access Code: [email or web address]    Consulted and Agree with Plan of Care  Patient       Patient will benefit from skilled therapeutic intervention in order to improve the following deficits and impairments:  Abnormal gait, Decreased balance, Decreased endurance, Decreased mobility, Difficulty walking, Decreased strength, Decreased activity tolerance, Decreased range of motion, Increased edema, Pain, Improper body mechanics, Increased muscle spasms  Visit Diagnosis: Acute pain of right knee  Stiffness of right knee, not elsewhere classified  Other abnormalities of gait and mobility     Problem List Patient Active Problem List   Diagnosis Date Noted  . OA (osteoarthritis) of knee 06/10/2019  . Tachyarrhythmia 02/14/2018  . Left hip pain 03/28/2016  . Atrial fibrillation ([place]) 03/31/2014  . Hot flashes - on low dose HRT with  gynecologist, Dr. Nori Riis 03/31/2014  . Hypothyroidism 07/28/2007    Saara Kijowski, PTA 07/08/2019, 4:11 PM  [facility] [address], [place], [place], [address] Phone: [phone]   Fax:  [phone]  Name: MAGGY RONES MRN: [id number] Date of Birth: 1934/09/22

## 2019-07-10 ENCOUNTER — Ambulatory Visit: Payer: Medicare Other | Admitting: Physical Therapy

## 2019-07-10 ENCOUNTER — Encounter: Payer: Self-pay | Admitting: Physical Therapy

## 2019-07-10 ENCOUNTER — Other Ambulatory Visit: Payer: Self-pay

## 2019-07-10 DIAGNOSIS — R2689 Other abnormalities of gait and mobility: Secondary | ICD-10-CM | POA: Diagnosis not present

## 2019-07-10 DIAGNOSIS — M25561 Pain in right knee: Secondary | ICD-10-CM | POA: Diagnosis not present

## 2019-07-10 DIAGNOSIS — M25661 Stiffness of right knee, not elsewhere classified: Secondary | ICD-10-CM | POA: Diagnosis not present

## 2019-07-10 NOTE — Therapy (Signed)
[facility] [address] [place], [place], [address] Phone: [phone]   Fax:  [phone]  Physical Therapy Treatment  Patient Details  Name: Marissa Hart MRN: [id number] Date of Birth: 02/04/1935 Referring Provider (PT): Ardeen Jourdain, [place]   Encounter Date: 07/10/2019  PT End of Session - 07/10/19 1531    Visit Number  12    Date for PT Re-Evaluation  08/09/19    Authorization Type  BCBS Medicare    Authorization Time Period  06/14/19 to 08/09/19    PT Start Time  1525    PT Stop Time  1620    PT Time Calculation (min)  55 min    Activity Tolerance  Patient tolerated treatment well    Behavior During Therapy  [facility] for tasks assessed/performed       Past Medical History:  Diagnosis Date  . Acquired renal cyst of right kidney   . Anxiety   . Atrial fibrillation and flutter ([place])    s/p PVI and CTI by Dr Rayann Heman 04/2013  . Blood transfusion   . Cataract   . CHEST WALL PAIN, HX OF 07/28/2007   Qualifier: Diagnosis of  By: Julien Girt CMA, [name]    . Diverticulosis of colon   . DJD (degenerative joint disease) 10/18/2011  . Erysipelas   . Fibromyalgia   . FIBROMYALGIA 07/28/2007   Qualifier: Diagnosis of  By: Julien Girt CMA, [name]    . GERD (gastroesophageal reflux disease)   . Hiatal hernia   . Hot flashes - on low dose HRT with gynecologist, Dr. Nori Riis 03/31/2014  . Hx of colonic polyps   . Hyperlipidemia   . MITRAL VALVE PROLAPSE 07/28/2007   Qualifier: Diagnosis of  By: Julien Girt CMA, [name]    . PONV (postoperative nausea and vomiting)   . Supraventricular tachycardia ([place])    AV nodal reentry  s/p RFCA 2001  . Thyroid disease     Past Surgical History:  Procedure Laterality Date  . ABDOMINAL HYSTERECTOMY     heavy menses, prolonged periods. no cancer. cervix remained.   . ATRIAL FIBRILLATION ABLATION N/A 04/30/2013   Procedure: ATRIAL FIBRILLATION ABLATION;  Surgeon: [name], MD;  Location: [facility] CATH  LAB;  Service: Cardiovascular;  Laterality: N/A;PVI and CTI by Dr Rayann Heman   . BREAST BIOPSY Left    benign  . BREAST BIOPSY  2011   benign  . CATARACT EXTRACTION BILATERAL W/ ANTERIOR VITRECTOMY    . COLONOSCOPY    . ESOPHAGOGASTRODUODENOSCOPY    . KNEE ARTHROSCOPY Right 2012  . KNEE SURGERY Right 08/22/2018  . LOOP RECORDER INSERTION N/A 05/24/2017   Procedure: LOOP RECORDER INSERTION;  Surgeon: [name], MD;  Location: [place] CV LAB;  Service: Cardiovascular;  Laterality: N/A;  . OOPHORECTOMY     done for cyst on ovaries.   [name] Kitchen ROTATOR CUFF REPAIR  08/2001   Dr. Gladstone Lighter  . SVT ABLATION N/A 07/20/2017   Procedure: SVT ABLATION;  Surgeon: [name], MD;  Location: [place] [facility] LAB;  Service: Cardiovascular;  Laterality: N/A;  . TEE WITHOUT CARDIOVERSION N/A 04/29/2013   Procedure: TRANSESOPHAGEAL ECHOCARDIOGRAM (TEE);  Surgeon: Jettie Booze, MD;  Location: [place];  Service: Cardiovascular;  Laterality: N/A;  . TOTAL KNEE ARTHROPLASTY Right 06/10/2019   Procedure: TOTAL KNEE ARTHROPLASTY;  Surgeon: Gaynelle Arabian, MD;  [facility]: [facility];  Service: Orthopedics;  Laterality: Right;  . UPPER GASTROINTESTINAL ENDOSCOPY      There were no vitals filed for  this visit.  Subjective Assessment - 07/10/19 1532    Subjective  Night time pain, I do better during the day.    Currently in Pain?  Yes    Pain Score  2     Pain Location  Knee    Pain Orientation  Right    Pain Descriptors / Indicators  Sore    Multiple Pain Sites  No         [facility] PT Assessment - 07/10/19 0001      PROM   Right Knee Extension  2                   [facility] Adult PT Treatment/Exercise - 07/10/19 0001      Knee/Hip Exercises: Aerobic   Stationary Bike  ROM forward full revolution x 10 min      Knee/Hip Exercises: Machines for Strengthening   Cybex Leg Press  Seat 6 Bil 45# 2x10, RTLE 20# 2x10       Knee/Hip Exercises: Standing   SLS  SLS RT with LT toe taps to 2nd step  20x       Knee/Hip Exercises: Seated   Long Arc Quad  AROM;Right;3 sets;10 reps   Pt demonstrating improved ability to extend knee     Knee/Hip Exercises: Supine   Quad Sets  AROM;Strengthening;Right;2 sets;5 reps   2x8 3 sec hold   Quad Sets Limitations  VC to "squash PTA fingers behind knee."      Vasopneumatic   Number Minutes Vasopneumatic   10 minutes    Vasopnuematic Location   Knee    Vasopneumatic Pressure  Low   Changed to low per pt request   Vasopneumatic Temperature   3 snowflakes      Manual Therapy   Soft tissue mobilization  Rt quad and posterior knee soft tissue work, knee extension stretching to tolerance   Pt with low tolerance to extension work              PT Short Term Goals - 07/10/19 1547      PT SHORT TERM GOAL #2   Title  Pt will be able to complete sit to stand from a chair with or without UE support and equal weight-bearing through the LEs throughout her session.    Baseline  Lt>Rt weightbearing due to Lt knee flexion    Time  4    Period  Weeks    Status  Achieved        PT Long Term Goals - 07/10/19 1548      PT LONG TERM GOAL #1   Title  Pt will be able to complete 5x sit to stand in less than 14 sec which will reflect improvements in functional strength.    Baseline  22 seconds    Period  Weeks    Status  Achieved   13 sec           Plan - 07/10/19 1531    Clinical Impression Statement  Pt arrives with medial knee pain, but demonstrates improved knee AROM ( both flex & extension) when she ambulates back into the gym. PTA was able to get 0 degrees passive extension today, this was not comfortable for pt. Quad set slightly improving but still requires something tactile to posterior knee for her to generate a quad contraction. Remains with swelling but continues to show improvement.    Personal Factors and Comorbidities  Age;Fitness    Examination-Activity Limitations  Squat;Stairs  Stability/Clinical Decision Making   Stable/Uncomplicated    Rehab Potential  Good    PT Frequency  2x / week    PT Duration  8 weeks    PT Treatment/Interventions  ADLs/Self Care Home Management;Electrical Stimulation;Therapeutic activities;Therapeutic exercise;Neuromuscular re-education;Balance training;Patient/family education;Manual techniques;Taping;Dry needling;Passive range of motion;Scar mobilization    PT Next Visit Plan  work on Rt knee extension, gait training, flexibility and strength, PT only likes 10 min of the [employer] at the end of session.    PT Home Exercise Plan  HEP from hospital: quad sets, heel slides (seated and supine), long arc quads, ankle pumps   Access Code: [email or web address]    Consulted and Agree with Plan of Care  Patient       Patient will benefit from skilled therapeutic intervention in order to improve the following deficits and impairments:  Abnormal gait, Decreased balance, Decreased endurance, Decreased mobility, Difficulty walking, Decreased strength, Decreased activity tolerance, Decreased range of motion, Increased edema, Pain, Improper body mechanics, Increased muscle spasms  Visit Diagnosis: Acute pain of right knee  Stiffness of right knee, not elsewhere classified  Other abnormalities of gait and mobility     Problem List Patient Active Problem List   Diagnosis Date Noted  . OA (osteoarthritis) of knee 06/10/2019  . Tachyarrhythmia 02/14/2018  . Left hip pain 03/28/2016  . Atrial fibrillation ([place]) 03/31/2014  . Hot flashes - on low dose HRT with gynecologist, Dr. Nori Riis 03/31/2014  . Hypothyroidism 07/28/2007    Namita Yearwood, PTA 07/10/2019, 4:13 PM   [facility] [address] [place], [place], [address] Phone: [phone]   Fax:  [phone]  Name: TIMISHA DUFFANY MRN: [id number] Date of Birth: 27-Jun-1935

## 2019-07-11 ENCOUNTER — Ambulatory Visit (INDEPENDENT_AMBULATORY_CARE_PROVIDER_SITE_OTHER): Payer: Medicare Other | Admitting: *Deleted

## 2019-07-11 DIAGNOSIS — I480 Paroxysmal atrial fibrillation: Secondary | ICD-10-CM | POA: Diagnosis not present

## 2019-07-11 LAB — CUP PACEART REMOTE DEVICE CHECK
Date Time Interrogation Session: 20201022142850
Implantable Pulse Generator Implant Date: 20180905

## 2019-07-12 ENCOUNTER — Encounter: Payer: Self-pay | Admitting: Physical Therapy

## 2019-07-12 ENCOUNTER — Ambulatory Visit: Payer: Medicare Other | Admitting: Physical Therapy

## 2019-07-12 ENCOUNTER — Other Ambulatory Visit: Payer: Self-pay

## 2019-07-12 DIAGNOSIS — M25661 Stiffness of right knee, not elsewhere classified: Secondary | ICD-10-CM | POA: Diagnosis not present

## 2019-07-12 DIAGNOSIS — R2689 Other abnormalities of gait and mobility: Secondary | ICD-10-CM

## 2019-07-12 DIAGNOSIS — M25561 Pain in right knee: Secondary | ICD-10-CM

## 2019-07-12 NOTE — Therapy (Signed)
[facility] [address] [place], [place], [address] Phone: [phone]   Fax:  [phone]  Physical Therapy Treatment  Patient Details  Name: Marissa Hart MRN: [id number] Date of Birth: 1935-05-05 Referring Provider (PT): Ardeen Jourdain, [place]   Encounter Date: 07/12/2019  PT End of Session - 07/12/19 1058    Visit Number  13    Date for PT Re-Evaluation  08/09/19    Authorization Type  BCBS Medicare    Authorization Time Period  06/14/19 to 08/09/19    PT Start Time  1015    PT Stop Time  1110    PT Time Calculation (min)  55 min    Activity Tolerance  Patient tolerated treatment well    Behavior During Therapy  [facility] for tasks assessed/performed       Past Medical History:  Diagnosis Date  . Acquired renal cyst of right kidney   . Anxiety   . Atrial fibrillation and flutter ([place])    s/p PVI and CTI by Dr Rayann Heman 04/2013  . Blood transfusion   . Cataract   . CHEST WALL PAIN, HX OF 07/28/2007   Qualifier: Diagnosis of  By: Julien Girt CMA, [name]    . Diverticulosis of colon   . DJD (degenerative joint disease) 10/18/2011  . Erysipelas   . Fibromyalgia   . FIBROMYALGIA 07/28/2007   Qualifier: Diagnosis of  By: Julien Girt CMA, [name]    . GERD (gastroesophageal reflux disease)   . Hiatal hernia   . Hot flashes - on low dose HRT with gynecologist, Dr. Nori Riis 03/31/2014  . Hx of colonic polyps   . Hyperlipidemia   . MITRAL VALVE PROLAPSE 07/28/2007   Qualifier: Diagnosis of  By: Julien Girt CMA, [name]    . PONV (postoperative nausea and vomiting)   . Supraventricular tachycardia ([place])    AV nodal reentry  s/p RFCA 2001  . Thyroid disease     Past Surgical History:  Procedure Laterality Date  . ABDOMINAL HYSTERECTOMY     heavy menses, prolonged periods. no cancer. cervix remained.   . ATRIAL FIBRILLATION ABLATION N/A 04/30/2013   Procedure: ATRIAL FIBRILLATION ABLATION;  Surgeon: [name], MD;  Location: [facility] CATH  LAB;  Service: Cardiovascular;  Laterality: N/A;PVI and CTI by Dr Rayann Heman   . BREAST BIOPSY Left    benign  . BREAST BIOPSY  2011   benign  . CATARACT EXTRACTION BILATERAL W/ ANTERIOR VITRECTOMY    . COLONOSCOPY    . ESOPHAGOGASTRODUODENOSCOPY    . KNEE ARTHROSCOPY Right 2012  . KNEE SURGERY Right 08/22/2018  . LOOP RECORDER INSERTION N/A 05/24/2017   Procedure: LOOP RECORDER INSERTION;  Surgeon: [name], MD;  Location: [place] CV LAB;  Service: Cardiovascular;  Laterality: N/A;  . OOPHORECTOMY     done for cyst on ovaries.   [name] Kitchen ROTATOR CUFF REPAIR  08/2001   Dr. Gladstone Lighter  . SVT ABLATION N/A 07/20/2017   Procedure: SVT ABLATION;  Surgeon: [name], MD;  Location: [place] CV LAB;  Service: Cardiovascular;  Laterality: N/A;  . TEE WITHOUT CARDIOVERSION N/A 04/29/2013   Procedure: TRANSESOPHAGEAL ECHOCARDIOGRAM (TEE);  Surgeon: Jettie Booze, MD;  Location: [place];  Service: Cardiovascular;  Laterality: N/A;  . TOTAL KNEE ARTHROPLASTY Right 06/10/2019   Procedure: TOTAL KNEE ARTHROPLASTY;  Surgeon: Gaynelle Arabian, MD;  Location: [facility]S;  Service: Orthopedics;  Laterality: Right;  . UPPER GASTROINTESTINAL ENDOSCOPY      There were no vitals filed for  this visit.  Subjective Assessment - 07/12/19 1016    Subjective  No new complaints this AM. Medial knee pain at night. Knee appears more swollen today.    Pertinent History  mitral valve prolapse, a-fib    Currently in Pain?  Yes    Pain Score  2     Pain Location  Knee    Pain Orientation  Right    Pain Descriptors / Indicators  Aching    Multiple Pain Sites  No         [facility] PT Assessment - 07/12/19 0001      PROM   Right Knee Extension  8   passive extension 5 at end of session: uncomfortable   Right Knee Flexion  112   post manual work: supine                  [facility] Adult PT Treatment/Exercise - 07/12/19 0001      Knee/Hip Exercises: Stretches   Gastroc Stretch  --   5x 20 sec  holds RTLE, VC to press heel down     Knee/Hip Exercises: Aerobic   Nustep  L2 x 10 min   PTA present: VC to "push" the knee straight     Knee/Hip Exercises: Machines for Strengthening   Cybex Leg Press  Seat 6 then 5  Bil 50# 2x15, RTLE 20# 2x10    VC to contract quad to straighten knee     Knee/Hip Exercises: Standing   SLS  SLS RT with LT toe taps to 2nd step 10x, no UE    Stopped due to pain increasing     Knee/Hip Exercises: Supine   Quad Sets  AROM;Strengthening;Right;2 sets;5 reps   2x8 3 sec hold   Quad Sets Limitations  VC to squash my fingers   Poor quad set today     Vasopneumatic   Number Minutes Vasopneumatic   10 minutes   give pt heat pad for her back during    Vasopnuematic Location   Knee    Vasopneumatic Pressure  Low   Changed to low per pt request   Vasopneumatic Temperature   3 snowflakes      Manual Therapy   Soft tissue mobilization  Rt quad and posterior knee soft tissue work, distal scar masage, retrogade massage for edema, knee extension stretching to tolerance   Pt with low tolerance to extension work, more swollen today              PT Short Term Goals - 07/10/19 1547      PT SHORT TERM GOAL #2   Title  Pt will be able to complete sit to stand from a chair with or without UE support and equal weight-bearing through the LEs throughout her session.    Baseline  Lt>Rt weightbearing due to Lt knee flexion    Time  4    Period  Weeks    Status  Achieved        PT Long Term Goals - 07/10/19 1548      PT LONG TERM GOAL #1   Title  Pt will be able to complete 5x sit to stand in less than 14 sec which will reflect improvements in functional strength.    Baseline  22 seconds    Period  Weeks    Status  Achieved   13 sec           Plan - 07/12/19 1101    Clinical Impression Statement  Pt initially arrives with no significant complaints but the knee did appear more swollen through her pants. She did have more difficulty than she  normally does performing her exercises due to pain, in fact we stopped after the toe taps on the stairs as the knee was starting to hurt and effect the quality of her exercise. Took more time to perform soft tissue work including retrograde massage to her Rt knee. Flexion measured slightly better at the end of session but extension remains limited ( although imrproved since eval.) her quad set earlier in the week was showing improvement but today the quality was not as good.    Personal Factors and Comorbidities  Age;Fitness    Examination-Activity Limitations  Squat;Stairs    Stability/Clinical Decision Making  Stable/Uncomplicated    Rehab Potential  Good    PT Frequency  2x / week    PT Duration  8 weeks    PT Treatment/Interventions  ADLs/Self Care Home Management;Electrical Stimulation;Therapeutic activities;Therapeutic exercise;Neuromuscular re-education;Balance training;Patient/family education;Manual techniques;Taping;Dry needling;Passive range of motion;Scar mobilization    PT Next Visit Plan  work on Rt knee extension, gait training, flexibility and strength, PT only likes 10 min of the Game Ready at the end of session.    PT Home Exercise Plan  HEP from hospital: quad sets, heel slides (seated and supine), long arc quads, ankle pumps   Access Code: [email or web address]    Consulted and Agree with Plan of Care  Patient       Patient will benefit from skilled therapeutic intervention in order to improve the following deficits and impairments:  Abnormal gait, Decreased balance, Decreased endurance, Decreased mobility, Difficulty walking, Decreased strength, Decreased activity tolerance, Decreased range of motion, Increased edema, Pain, Improper body mechanics, Increased muscle spasms  Visit Diagnosis: Stiffness of right knee, not elsewhere classified  Acute pain of right knee  Other abnormalities of gait and mobility     Problem List Patient Active Problem List   Diagnosis Date Noted  . OA  (osteoarthritis) of knee 06/10/2019  . Tachyarrhythmia 02/14/2018  . Left hip pain 03/28/2016  . Atrial fibrillation ([place]) 03/31/2014  . Hot flashes - on low dose HRT with gynecologist, Dr. Nori Riis 03/31/2014  . Hypothyroidism 07/28/2007    Lacrecia Delval, PTA 07/12/2019, 11:13 AM  [facility] [address], [place], [place], [address] Phone: [phone]   Fax:  [phone]  Name: Marissa Hart MRN: [id number] Date of Birth: 1935/04/16

## 2019-07-15 ENCOUNTER — Other Ambulatory Visit: Payer: Self-pay

## 2019-07-15 ENCOUNTER — Encounter: Payer: Self-pay | Admitting: Physical Therapy

## 2019-07-15 ENCOUNTER — Ambulatory Visit: Payer: Medicare Other | Admitting: Physical Therapy

## 2019-07-15 DIAGNOSIS — R2689 Other abnormalities of gait and mobility: Secondary | ICD-10-CM

## 2019-07-15 DIAGNOSIS — M25661 Stiffness of right knee, not elsewhere classified: Secondary | ICD-10-CM

## 2019-07-15 DIAGNOSIS — M25561 Pain in right knee: Secondary | ICD-10-CM | POA: Diagnosis not present

## 2019-07-15 NOTE — Therapy (Signed)
[facility] [address] [place], [place], [address] Phone: [phone]   Fax:  [phone]  Physical Therapy Treatment  Patient Details  Name: Marissa Hart MRN: [id number] Date of Birth: 1935-05-19 Referring Provider (PT): Ardeen Jourdain, [place]   Encounter Date: 07/15/2019  PT End of Session - 07/15/19 1535    Visit Number  14    Date for PT Re-Evaluation  08/09/19    Authorization Type  BCBS Medicare    Authorization Time Period  06/14/19 to 08/09/19    PT Start Time  1530    PT Stop Time  1620   10 min vasopneumatic   PT Time Calculation (min)  50 min    Activity Tolerance  Patient tolerated treatment well    Behavior During Therapy  [facility] for tasks assessed/performed       Past Medical History:  Diagnosis Date  . Acquired renal cyst of right kidney   . Anxiety   . Atrial fibrillation and flutter ([place])    s/p PVI and CTI by Dr Rayann Heman 04/2013  . Blood transfusion   . Cataract   . CHEST WALL PAIN, HX OF 07/28/2007   Qualifier: Diagnosis of  By: Julien Girt CMA, [name]    . Diverticulosis of colon   . DJD (degenerative joint disease) 10/18/2011  . Erysipelas   . Fibromyalgia   . FIBROMYALGIA 07/28/2007   Qualifier: Diagnosis of  By: Julien Girt CMA, [name]    . GERD (gastroesophageal reflux disease)   . Hiatal hernia   . Hot flashes - on low dose HRT with gynecologist, Dr. Nori Riis 03/31/2014  . Hx of colonic polyps   . Hyperlipidemia   . MITRAL VALVE PROLAPSE 07/28/2007   Qualifier: Diagnosis of  By: Julien Girt CMA, [name]    . PONV (postoperative nausea and vomiting)   . Supraventricular tachycardia ([place])    AV nodal reentry  s/p RFCA 2001  . Thyroid disease     Past Surgical History:  Procedure Laterality Date  . ABDOMINAL HYSTERECTOMY     heavy menses, prolonged periods. no cancer. cervix remained.   . ATRIAL FIBRILLATION ABLATION N/A 04/30/2013   Procedure: ATRIAL FIBRILLATION ABLATION;  Surgeon: [name], MD;   Location: [facility] CATH LAB;  Service: Cardiovascular;  Laterality: N/A;PVI and CTI by Dr Rayann Heman   . BREAST BIOPSY Left    benign  . BREAST BIOPSY  2011   benign  . CATARACT EXTRACTION BILATERAL W/ ANTERIOR VITRECTOMY    . COLONOSCOPY    . ESOPHAGOGASTRODUODENOSCOPY    . KNEE ARTHROSCOPY Right 2012  . KNEE SURGERY Right 08/22/2018  . LOOP RECORDER INSERTION N/A 05/24/2017   Procedure: LOOP RECORDER INSERTION;  Surgeon: [name], MD;  Location: [place] CV LAB;  Service: Cardiovascular;  Laterality: N/A;  . OOPHORECTOMY     done for cyst on ovaries.   [name] Kitchen ROTATOR CUFF REPAIR  08/2001   Dr. Gladstone Lighter  . SVT ABLATION N/A 07/20/2017   Procedure: SVT ABLATION;  Surgeon: [name], MD;  Location: [place] CV LAB;  Service: Cardiovascular;  Laterality: N/A;  . TEE WITHOUT CARDIOVERSION N/A 04/29/2013   Procedure: TRANSESOPHAGEAL ECHOCARDIOGRAM (TEE);  Surgeon: Jettie Booze, MD;  Location: [place];  Service: Cardiovascular;  Laterality: N/A;  . TOTAL KNEE ARTHROPLASTY Right 06/10/2019   Procedure: TOTAL KNEE ARTHROPLASTY;  Surgeon: Gaynelle Arabian, MD;  Location: [facility];  Service: Orthopedics;  Laterality: Right;  . UPPER GASTROINTESTINAL ENDOSCOPY      There were  no vitals filed for this visit.  Subjective Assessment - 07/15/19 1533    Subjective  Pt feels like she is doing well.  1/10 pain in medial knee on occassion.    Pertinent History  mitral valve prolapse, a-fib    Limitations  Walking    Patient Stated Goals  improve mobility    Currently in Pain?  Yes    Pain Score  1     Pain Location  Knee    Pain Orientation  Right;Medial    Pain Descriptors / Indicators  Aching    Pain Type  Surgical pain    Pain Onset  1 to 4 weeks ago    Pain Frequency  Intermittent    Aggravating Factors   has been better at night    Pain Relieving Factors  ice, medication    Effect of Pain on Daily Activities  limited mobility         [facility] PT Assessment - 07/15/19 0001      ROM  / Strength   AROM / PROM / Strength  AROM;Strength      AROM   Overall AROM   Deficits    AROM Assessment Site  Knee    Right/Left Knee  Right    Right Knee Extension  -8    Right Knee Flexion  112      Strength   Overall Strength Comments  Rt knee extension 4-/5 with fair VMO recruitment                   [facility] Adult PT Treatment/Exercise - 07/15/19 0001      Ambulation/Gait   Assistive device  Straight cane    Gait Comments  decreased Rt knee flexion during swing phase, limited knee extension in stance phase, mild valgus Rt knee in stance phase      Knee/Hip Exercises: Stretches   Gastroc Stretch  Right;2 reps;30 seconds      Knee/Hip Exercises: Aerobic   Nustep  L2 x 10', legs only, PT present to discuss progress      Knee/Hip Exercises: Machines for Strengthening   Cybex Leg Press  50# 1x15 bil LEs seat 6, Rt LE 20# 1x10   Rt hip pain so just one set of each     Knee/Hip Exercises: Standing   SLS  SLS RT with LT toe taps to 2nd step 10x, bil UE support    Stopped due to pain increasing   Other Standing Knee Exercises  standing weight shifts into Rt LE "stand tall through hip and knee" 5x5 sec, bil UE support      Knee/Hip Exercises: Seated   Heel Slides  Right;20 reps    Heel Slides Limitations  foot on slider    Other Seated Knee/Hip Exercises  quad sets in seated hamstring stretch posiition to encourage knee extension 10x5 sec, heel on black pad    Hamstring Curl  Right;10 reps;Strengthening    Hamstring Limitations  yellow band around ankles, foot on slider    Abd/Adduction Limitations  seated clam with yellow band around thighs 2x10 yellow band      Modalities   Modalities  Vasopneumatic      Vasopneumatic   Number Minutes Vasopneumatic   10 minutes   give pt heat pad for her back during    Vasopnuematic Location   Knee    Vasopneumatic Pressure  Low    Vasopneumatic Temperature   3 snowflakes      Manual Therapy  Manual Therapy  Soft tissue  mobilization;Joint mobilization    Joint Mobilization  Rt knee end range extension mobs with tibial ER Gr II/III    Soft tissue mobilization  scar mobs along patellar aspect               PT Short Term Goals - 07/10/19 1547      PT SHORT TERM GOAL #2   Title  Pt will be able to complete sit to stand from a chair with or without UE support and equal weight-bearing through the LEs throughout her session.    Baseline  Lt>Rt weightbearing due to Lt knee flexion    Time  4    Period  Weeks    Status  Achieved        PT Long Term Goals - 07/15/19 1621      PT LONG TERM GOAL #1   Title  Pt will be able to complete 5x sit to stand in less than 14 sec which will reflect improvements in functional strength.    Baseline  22 seconds    Status  Achieved      PT LONG TERM GOAL #2   Title  Pt will be able to ascend and descend clinic steps atleast 2/3 trials with no more than 1 handrail and reciprocal pattern.    Baseline  moderate UE support bilateally with ascending    Status  On-going      PT LONG TERM GOAL #3   Title  Pt will have atleast 110 deg of Rt active knee flexion full extension to improve walking mechanics and efficiency with daily tasks.    Baseline  -8 to 112, met for flexion    Status  On-going      PT LONG TERM GOAL #4   Title  Pt will be able to complete TUG with LRAD in less than 20 sec to reflect improvements in her mobility.    Status  On-going            Plan - 07/15/19 1617    Clinical Impression Statement  Pt with continued limitation in end range extension.  She reports low grade intermittent medial knee pain that comes and goes with movement.  She has fair VMO recruitment and needs VCs to avoid Rt knee valgus during leg press, NuStep and sit to stand.  PT reinforced importance of compliance with heel prop and quad sets to improve end range knee extension.  She will continue to benefit from skilled PT along current POC with focus on improved Rt knee  ROM and Rt LE strength.    Rehab Potential  Good    PT Frequency  2x / week    PT Duration  8 weeks    PT Treatment/Interventions  ADLs/Self Care Home Management;Electrical Stimulation;Therapeutic activities;Therapeutic exercise;Neuromuscular re-education;Balance training;Patient/family education;Manual techniques;Taping;Dry needling;Passive range of motion;Scar mobilization    PT Next Visit Plan  work on Rt knee extension, gait training, flexibility and strength, PT only likes 10 min of the Game Ready at the end of session.    PT Home Exercise Plan  HEP from hospital: quad sets, heel slides (seated and supine), long arc quads, ankle pumps   Access Code: [email or web address]    Consulted and Agree with Plan of Care  Patient       Patient will benefit from skilled therapeutic intervention in order to improve the following deficits and impairments:     Visit Diagnosis: Stiffness of right knee, not elsewhere classified  Acute pain of right knee  Other abnormalities of gait and mobility     Problem List Patient Active Problem List   Diagnosis Date Noted  . OA (osteoarthritis) of knee 06/10/2019  . Tachyarrhythmia 02/14/2018  . Left hip pain 03/28/2016  . Atrial fibrillation ([place]) 03/31/2014  . Hot flashes - on low dose HRT with gynecologist, Dr. Nori Riis 03/31/2014  . Hypothyroidism 07/28/2007   Baruch Merl, PT 07/15/19 4:31 PM   [facility] [address] [place], [place], [address] Phone: [phone]   Fax:  [phone]  Name: Marissa Hart MRN: [id number] Date of Birth: 1935/04/01

## 2019-07-16 DIAGNOSIS — Z471 Aftercare following joint replacement surgery: Secondary | ICD-10-CM | POA: Diagnosis not present

## 2019-07-16 DIAGNOSIS — Z96651 Presence of right artificial knee joint: Secondary | ICD-10-CM | POA: Diagnosis not present

## 2019-07-17 ENCOUNTER — Other Ambulatory Visit: Payer: Self-pay

## 2019-07-17 ENCOUNTER — Ambulatory Visit: Payer: Medicare Other

## 2019-07-17 DIAGNOSIS — R2689 Other abnormalities of gait and mobility: Secondary | ICD-10-CM | POA: Diagnosis not present

## 2019-07-17 DIAGNOSIS — M25561 Pain in right knee: Secondary | ICD-10-CM

## 2019-07-17 DIAGNOSIS — M25661 Stiffness of right knee, not elsewhere classified: Secondary | ICD-10-CM | POA: Diagnosis not present

## 2019-07-17 NOTE — Patient Instructions (Signed)
Access Code: [email or web address]  URL: [email or web address]  Date: 07/17/2019  Prepared by: Sigurd Sos   Exercises  Seated Knee Flexion Extension AROM - 10 reps - 1 sets - 10 hold - 2x daily - 7x weekly Supine Heel Slide with Strap - 10 reps - 1 sets - 10 hold - 2x daily - 7x weekly

## 2019-07-17 NOTE — Therapy (Signed)
[facility] [address] [place], [place], [address] Phone: [phone]   Fax:  [phone]  Physical Therapy Treatment  Patient Details  Name: Marissa Hart MRN: [id number] Date of Birth: 08-09-1935 Referring Provider (Marissa Hart): Ardeen Jourdain, [place]   Encounter Date: 07/17/2019  Marissa Hart End of Session - 07/17/19 1611    Visit Number  15    Date for Marissa Hart Re-Evaluation  08/09/19    Authorization Type  BCBS Medicare- KX modifier needed    Marissa Hart Start Time  1529    Marissa Hart Stop Time  1625    Marissa Hart Time Calculation (min)  56 min    Activity Tolerance  Patient tolerated treatment well    Behavior During Therapy  [facility] for tasks assessed/performed       Past Medical History:  Diagnosis Date  . Acquired renal cyst of right kidney   . Anxiety   . Atrial fibrillation and flutter ([address])    s/p PVI and CTI by Dr Rayann Heman 04/2013  . Blood transfusion   . Cataract   . CHEST WALL PAIN, HX OF 07/28/2007   Qualifier: Diagnosis of  By: Julien Girt CMA, [name]    . Diverticulosis of colon   . DJD (degenerative joint disease) 10/18/2011  . Erysipelas   . Fibromyalgia   . FIBROMYALGIA 07/28/2007   Qualifier: Diagnosis of  By: Julien Girt CMA, [name]    . GERD (gastroesophageal reflux disease)   . Hiatal hernia   . Hot flashes - on low dose HRT with gynecologist, Dr. Nori Riis 03/31/2014  . Hx of colonic polyps   . Hyperlipidemia   . MITRAL VALVE PROLAPSE 07/28/2007   Qualifier: Diagnosis of  By: Julien Girt CMA, [name]    . PONV (postoperative nausea and vomiting)   . Supraventricular tachycardia ([place])    AV nodal reentry  s/p RFCA 2001  . Thyroid disease     Past Surgical History:  Procedure Laterality Date  . ABDOMINAL HYSTERECTOMY     heavy menses, prolonged periods. no cancer. cervix remained.   . ATRIAL FIBRILLATION ABLATION N/A 04/30/2013   Procedure: ATRIAL FIBRILLATION ABLATION;  Surgeon: [name], MD;  Location: [facility] CATH LAB;  Service: Cardiovascular;   Laterality: N/A;PVI and CTI by Dr Rayann Heman   . BREAST BIOPSY Left    benign  . BREAST BIOPSY  2011   benign  . CATARACT EXTRACTION BILATERAL W/ ANTERIOR VITRECTOMY    . COLONOSCOPY    . ESOPHAGOGASTRODUODENOSCOPY    . KNEE ARTHROSCOPY Right 2012  . KNEE SURGERY Right 08/22/2018  . LOOP RECORDER INSERTION N/A 05/24/2017   Procedure: LOOP RECORDER INSERTION;  Surgeon: [name], MD;  Location: [place] CV LAB;  Service: Cardiovascular;  Laterality: N/A;  . OOPHORECTOMY     done for cyst on ovaries.   [name] Kitchen ROTATOR CUFF REPAIR  08/2001   Dr. Gladstone Lighter  . SVT ABLATION N/A 07/20/2017   Procedure: SVT ABLATION;  Surgeon: [name], MD;  Location: [place] CV LAB;  Service: Cardiovascular;  Laterality: N/A;  . TEE WITHOUT CARDIOVERSION N/A 04/29/2013   Procedure: TRANSESOPHAGEAL ECHOCARDIOGRAM (TEE);  Surgeon: Jettie Booze, MD;  Location: [place];  Service: Cardiovascular;  Laterality: N/A;  . TOTAL KNEE ARTHROPLASTY Right 06/10/2019   Procedure: TOTAL KNEE ARTHROPLASTY;  Surgeon: Gaynelle Arabian, MD;  Location: [facility];  Service: Orthopedics;  Laterality: Right;  . UPPER GASTROINTESTINAL ENDOSCOPY      There were no vitals filed for this visit.  Subjective Assessment - 07/17/19  1529    Subjective  I was able to drive here today.  MD cleared me to drive.    Currently in Pain?  Yes    Pain Score  1     Pain Location  Knee    Pain Orientation  Right;Medial                       [facility] Adult Marissa Hart Treatment/Exercise - 07/17/19 0001      Knee/Hip Exercises: Stretches   Knee: Self-Stretch to increase Flexion  Right;10 seconds;5 reps    Gastroc Stretch  Right;2 reps;30 seconds      Knee/Hip Exercises: Aerobic   Nustep  L3 to level 2 x 10', legs only, Marissa Hart present to discuss progress      Knee/Hip Exercises: Machines for Strengthening   Cybex Leg Press  50# 2x15 bil LEs seat 6, Rt LE 20# 2x10      Knee/Hip Exercises: Standing   Other Standing Knee Exercises   knee flexion stretch on steps 10" x 10      Knee/Hip Exercises: Seated   Heel Slides  Right;20 reps    Heel Slides Limitations  foot on slider      Knee/Hip Exercises: Supine   Heel Slides  Right;AAROM;1 set;10 reps    Straight Leg Raises  Strengthening;Right;2 sets;10 reps      Modalities   Modalities  Vasopneumatic      Vasopneumatic   Number Minutes Vasopneumatic   10 minutes    Vasopnuematic Location   Knee    Vasopneumatic Pressure  Low    Vasopneumatic Temperature   3 snowflakes      Manual Therapy   Manual Therapy  Passive ROM    Manual therapy comments  patellar mobs and PROM into extension             Marissa Hart Education - 07/17/19 1546    Education Details  Access Code: [email or web address]    Person(s) Educated  Patient    Methods  Explanation;Demonstration;Handout    Comprehension  Verbalized understanding;Returned demonstration       Marissa Hart Short Term Goals - 07/10/19 1547      Marissa Hart SHORT TERM GOAL #2   Title  Marissa Hart will be able to complete sit to stand from a chair with or without UE support and equal weight-bearing through the LEs throughout her session.    Baseline  Lt>Rt weightbearing due to Lt knee flexion    Time  4    Period  Weeks    Status  Achieved        Marissa Hart Long Term Goals - 07/15/19 1621      Marissa Hart LONG TERM GOAL #1   Title  Marissa Hart will be able to complete 5x sit to stand in less than 14 sec which will reflect improvements in functional strength.    Baseline  22 seconds    Status  Achieved      Marissa Hart LONG TERM GOAL #2   Title  Marissa Hart will be able to ascend and descend clinic steps atleast 2/3 trials with no more than 1 handrail and reciprocal pattern.    Baseline  moderate UE support bilateally with ascending    Status  On-going      Marissa Hart LONG TERM GOAL #3   Title  Marissa Hart will have atleast 110 deg of Rt active knee flexion full extension to improve walking mechanics and efficiency with daily tasks.    Baseline  -8 to 112, met  for flexion    Status  On-going      Marissa Hart LONG  TERM GOAL #4   Title  Marissa Hart will be able to complete TUG with LRAD in less than 20 sec to reflect improvements in her mobility.    Status  On-going            Plan - 07/17/19 1558    Clinical Impression Statement  Marissa Hart with continued limitation in Rt end range extension which limits gait.  Marissa Hart requires cueing to avoid Rt knee valgus during leg press, NuStep and sit to stand exercises today.  Marissa Hart reports that MD wants her to attend 2 more weeks of Marissa Hart.  Marissa Hart will take next week off and attend for 2 weeks after that.  Marissa Hart emphasized the importance of compliance with HEP for strength and A/ROM gains.  She will continue to benefit from skilled Marissa Hart along current POC with focus on improved Rt knee ROM and Rt LE strength to normalize gait and endurance.    Marissa Hart Frequency  2x / week    Marissa Hart Duration  8 weeks    Marissa Hart Treatment/Interventions  ADLs/Self Care Home Management;Electrical Stimulation;Therapeutic activities;Therapeutic exercise;Neuromuscular re-education;Balance training;Patient/family education;Manual techniques;Taping;Dry needling;Passive range of motion;Scar mobilization    Marissa Hart Next Visit Plan  Marissa Hart will take next week off from Marissa Hart.  work on Rt knee extension, gait training, flexibility and strength, Marissa Hart only likes 10 min of the Game Ready at the end of session.    Marissa Hart Home Exercise Plan  HEP from hospital: quad sets, heel slides (seated and supine), long arc quads, ankle pumps   Access Code: [email or web address]    Consulted and Agree with Plan of Care  Patient       Patient will benefit from skilled therapeutic intervention in order to improve the following deficits and impairments:  Abnormal gait, Decreased balance, Decreased endurance, Decreased mobility, Difficulty walking, Decreased strength, Decreased activity tolerance, Decreased range of motion, Increased edema, Pain, Improper body mechanics, Increased muscle spasms  Visit Diagnosis: Stiffness of right knee, not elsewhere classified  Acute pain of right  knee  Other abnormalities of gait and mobility     Problem List Patient Active Problem List   Diagnosis Date Noted  . OA (osteoarthritis) of knee 06/10/2019  . Tachyarrhythmia 02/14/2018  . Left hip pain 03/28/2016  . Atrial fibrillation ([place]) 03/31/2014  . Hot flashes - on low dose HRT with gynecologist, Dr. Nori Riis 03/31/2014  . Hypothyroidism 07/28/2007     Marissa Hart, Marissa Hart 07/17/19 4:14 PM  [facility] [address] [place], [place], [address] Phone: [phone]   Fax:  [phone]  Name: Marissa Hart MRN: [id number] Date of Birth: September 15, 1935

## 2019-07-19 ENCOUNTER — Encounter: Payer: Self-pay | Admitting: Physical Therapy

## 2019-07-19 ENCOUNTER — Other Ambulatory Visit: Payer: Self-pay

## 2019-07-19 ENCOUNTER — Ambulatory Visit: Payer: Medicare Other | Admitting: Physical Therapy

## 2019-07-19 DIAGNOSIS — R2689 Other abnormalities of gait and mobility: Secondary | ICD-10-CM | POA: Diagnosis not present

## 2019-07-19 DIAGNOSIS — M25561 Pain in right knee: Secondary | ICD-10-CM

## 2019-07-19 DIAGNOSIS — M25661 Stiffness of right knee, not elsewhere classified: Secondary | ICD-10-CM

## 2019-07-19 NOTE — Therapy (Signed)
[facility] [address] [place], [place], [address] Phone: [phone]   Fax:  [phone]  Physical Therapy Treatment  Patient Details  Name: Marissa Hart MRN: [id number] Date of Birth: 10-22-1934 Referring Provider (PT): Ardeen Jourdain, [place]   Encounter Date: 07/19/2019  PT End of Session - 07/19/19 1022    Visit Number  16    Date for PT Re-Evaluation  08/09/19    Authorization Type  BCBS Medicare- KX modifier needed    Authorization Time Period  06/14/19 to 08/09/19    PT Start Time  1015    PT Stop Time  1110    PT Time Calculation (min)  55 min    Activity Tolerance  Patient tolerated treatment well    Behavior During Therapy  [facility] for tasks assessed/performed       Past Medical History:  Diagnosis Date  . Acquired renal cyst of right kidney   . Anxiety   . Atrial fibrillation and flutter ([place])    s/p PVI and CTI by Dr Rayann Heman 04/2013  . Blood transfusion   . Cataract   . CHEST WALL PAIN, HX OF 07/28/2007   Qualifier: Diagnosis of  By: Julien Girt CMA, [name]    . Diverticulosis of colon   . DJD (degenerative joint disease) 10/18/2011  . Erysipelas   . Fibromyalgia   . FIBROMYALGIA 07/28/2007   Qualifier: Diagnosis of  By: Julien Girt CMA, [name]    . GERD (gastroesophageal reflux disease)   . Hiatal hernia   . Hot flashes - on low dose HRT with gynecologist, Dr. Nori Riis 03/31/2014  . Hx of colonic polyps   . Hyperlipidemia   . MITRAL VALVE PROLAPSE 07/28/2007   Qualifier: Diagnosis of  By: Julien Girt CMA, [name]    . PONV (postoperative nausea and vomiting)   . Supraventricular tachycardia ([place])    AV nodal reentry  s/p RFCA 2001  . Thyroid disease     Past Surgical History:  Procedure Laterality Date  . ABDOMINAL HYSTERECTOMY     heavy menses, prolonged periods. no cancer. cervix remained.   . ATRIAL FIBRILLATION ABLATION N/A 04/30/2013   Procedure: ATRIAL FIBRILLATION ABLATION;  Surgeon: [name], MD;   Location: [facility] CATH LAB;  Service: Cardiovascular;  Laterality: N/A;PVI and CTI by Dr Rayann Heman   . BREAST BIOPSY Left    benign  . BREAST BIOPSY  2011   benign  . CATARACT EXTRACTION BILATERAL W/ ANTERIOR VITRECTOMY    . COLONOSCOPY    . ESOPHAGOGASTRODUODENOSCOPY    . KNEE ARTHROSCOPY Right 2012  . KNEE SURGERY Right 08/22/2018  . LOOP RECORDER INSERTION N/A 05/24/2017   Procedure: LOOP RECORDER INSERTION;  Surgeon: [name], MD;  Location: [place] CV LAB;  Service: Cardiovascular;  Laterality: N/A;  . OOPHORECTOMY     done for cyst on ovaries.   [name] Kitchen ROTATOR CUFF REPAIR  08/2001   Dr. Gladstone Lighter  . SVT ABLATION N/A 07/20/2017   Procedure: SVT ABLATION;  Surgeon: [name], MD;  Location: [place] CV LAB;  Service: Cardiovascular;  Laterality: N/A;  . TEE WITHOUT CARDIOVERSION N/A 04/29/2013   Procedure: TRANSESOPHAGEAL ECHOCARDIOGRAM (TEE);  Surgeon: Jettie Booze, MD;  Location: [place];  Service: Cardiovascular;  Laterality: N/A;  . TOTAL KNEE ARTHROPLASTY Right 06/10/2019   Procedure: TOTAL KNEE ARTHROPLASTY;  Surgeon: Gaynelle Arabian, MD;  Location: [facility];  Service: Orthopedics;  Laterality: Right;  . UPPER GASTROINTESTINAL ENDOSCOPY      There were no  vitals filed for this visit.  Subjective Assessment - 07/19/19 1023    Subjective  I am tender today in my knee. Maybe I over did it yesterday??    Currently in Pain?  Yes    Pain Score  4     Pain Location  Knee    Pain Orientation  Right    Pain Descriptors / Indicators  Tender    Multiple Pain Sites  No                       [facility] Adult PT Treatment/Exercise - 07/19/19 0001      Knee/Hip Exercises: Aerobic   Nustep  L2 x 6 min at end of session. PTA present for VC to "push" leg straight to facilitate her quad strength      Knee/Hip Exercises: Machines for Strengthening   Cybex Leg Press  Seat 6: Bil 55# 2x15, RTLE 20# 3x 10       Knee/Hip Exercises: Seated   Long Arc Quad  AROM;Right;1  set;20 reps    Heel Slides  Right;20 reps    Heel Slides Limitations  foot on slider    Clamshell with TheraBand  Red   20x     Vasopneumatic   Number Minutes Vasopneumatic   10 minutes    Vasopnuematic Location   Knee    Vasopneumatic Pressure  Low    Vasopneumatic Temperature   3 snowflakes      Manual Therapy   Manual Therapy  Passive ROM    Manual therapy comments  patellar mobs and PROM into extension               PT Short Term Goals - 07/10/19 1547      PT SHORT TERM GOAL #2   Title  Pt will be able to complete sit to stand from a chair with or without UE support and equal weight-bearing through the LEs throughout her session.    Baseline  Lt>Rt weightbearing due to Lt knee flexion    Time  4    Period  Weeks    Status  Achieved        PT Long Term Goals - 07/15/19 1621      PT LONG TERM GOAL #1   Title  Pt will be able to complete 5x sit to stand in less than 14 sec which will reflect improvements in functional strength.    Baseline  22 seconds    Status  Achieved      PT LONG TERM GOAL #2   Title  Pt will be able to ascend and descend clinic steps atleast 2/3 trials with no more than 1 handrail and reciprocal pattern.    Baseline  moderate UE support bilateally with ascending    Status  On-going      PT LONG TERM GOAL #3   Title  Pt will have atleast 110 deg of Rt active knee flexion full extension to improve walking mechanics and efficiency with daily tasks.    Baseline  -8 to 112, met for flexion    Status  On-going      PT LONG TERM GOAL #4   Title  Pt will be able to complete TUG with LRAD in less than 20 sec to reflect improvements in her mobility.    Status  On-going            Plan - 07/19/19 1022    Clinical Impression Statement  Pt requires  encouragement to achieve as much active extension as she can during her exercises. Pt ambulates with slow speed, mostly a step to pattern vs a good step length that is "in front " of her other  foot. Continue to work on improving her knee extension with soft tissue work and static stretching that pt could tolerate as she is "tender "today in her knee.    Personal Factors and Comorbidities  Age;Fitness    Examination-Activity Limitations  Squat;Stairs    Stability/Clinical Decision Making  Stable/Uncomplicated    PT Frequency  2x / week    PT Duration  8 weeks    PT Treatment/Interventions  ADLs/Self Care Home Management;Electrical Stimulation;Therapeutic activities;Therapeutic exercise;Neuromuscular re-education;Balance training;Patient/family education;Manual techniques;Taping;Dry needling;Passive range of motion;Scar mobilization    PT Next Visit Plan  Pt will take next week off from PT.  work on Rt knee extension, gait training, flexibility and strength, PT only likes 10 min of the Game Ready at the end of session.    Consulted and Agree with Plan of Care  Patient       Patient will benefit from skilled therapeutic intervention in order to improve the following deficits and impairments:  Abnormal gait, Decreased balance, Decreased endurance, Decreased mobility, Difficulty walking, Decreased strength, Decreased activity tolerance, Decreased range of motion, Increased edema, Pain, Improper body mechanics, Increased muscle spasms  Visit Diagnosis: Stiffness of right knee, not elsewhere classified  Acute pain of right knee  Other abnormalities of gait and mobility     Problem List Patient Active Problem List   Diagnosis Date Noted  . OA (osteoarthritis) of knee 06/10/2019  . Tachyarrhythmia 02/14/2018  . Left hip pain 03/28/2016  . Atrial fibrillation ([place]) 03/31/2014  . Hot flashes - on low dose HRT with gynecologist, Dr. Nori Riis 03/31/2014  . Hypothyroidism 07/28/2007    Clemon Devaul, PTA 07/19/2019, 10:56 AM  [facility] [address], [place], [place], [address] Phone: [phone]   Fax:   [phone]  Name: Marissa Hart MRN: [id number] Date of Birth: Oct 06, 1934

## 2019-07-23 NOTE — Progress Notes (Signed)
Carelink Summary Report / Loop Recorder 

## 2019-08-02 ENCOUNTER — Other Ambulatory Visit: Payer: Self-pay

## 2019-08-02 ENCOUNTER — Ambulatory Visit: Payer: Medicare Other | Attending: Orthopedic Surgery | Admitting: Physical Therapy

## 2019-08-02 ENCOUNTER — Encounter: Payer: Self-pay | Admitting: Physical Therapy

## 2019-08-02 DIAGNOSIS — M25561 Pain in right knee: Secondary | ICD-10-CM | POA: Diagnosis not present

## 2019-08-02 DIAGNOSIS — R2689 Other abnormalities of gait and mobility: Secondary | ICD-10-CM | POA: Diagnosis not present

## 2019-08-02 DIAGNOSIS — M25661 Stiffness of right knee, not elsewhere classified: Secondary | ICD-10-CM

## 2019-08-02 NOTE — Therapy (Signed)
[facility] [address] [place], [place], [address] Phone: [phone]   Fax:  [phone]  Physical Therapy Treatment  Patient Details  Name: Marissa Hart MRN: [id number] Date of Birth: 03-Apr-1935 Referring Provider (PT): Ardeen Jourdain, [place]   Encounter Date: 08/02/2019  PT End of Session - 08/02/19 1017    Visit Number  17    Date for PT Re-Evaluation  08/09/19    Authorization Type  BCBS Medicare- KX modifier needed    Authorization Time Period  06/14/19 to 08/09/19    PT Start Time  1015    PT Stop Time  1105    PT Time Calculation (min)  50 min    Activity Tolerance  Patient tolerated treatment well    Behavior During Therapy  [facility] for tasks assessed/performed       Past Medical History:  Diagnosis Date  . Acquired renal cyst of right kidney   . Anxiety   . Atrial fibrillation and flutter ([place])    s/p PVI and CTI by Dr Rayann Heman 04/2013  . Blood transfusion   . Cataract   . CHEST WALL PAIN, HX OF 07/28/2007   Qualifier: Diagnosis of  By: Julien Girt CMA, [name]    . Diverticulosis of colon   . DJD (degenerative joint disease) 10/18/2011  . Erysipelas   . Fibromyalgia   . FIBROMYALGIA 07/28/2007   Qualifier: Diagnosis of  By: Julien Girt CMA, [name]    . GERD (gastroesophageal reflux disease)   . Hiatal hernia   . Hot flashes - on low dose HRT with gynecologist, Dr. Nori Riis 03/31/2014  . Hx of colonic polyps   . Hyperlipidemia   . MITRAL VALVE PROLAPSE 07/28/2007   Qualifier: Diagnosis of  By: Julien Girt CMA, [name]    . PONV (postoperative nausea and vomiting)   . Supraventricular tachycardia ([place])    AV nodal reentry  s/p RFCA 2001  . Thyroid disease     Past Surgical History:  Procedure Laterality Date  . ABDOMINAL HYSTERECTOMY     heavy menses, prolonged periods. no cancer. cervix remained.   . ATRIAL FIBRILLATION ABLATION N/A 04/30/2013   Procedure: ATRIAL FIBRILLATION ABLATION;  Surgeon: [name], MD;   Location: [facility] CATH LAB;  Service: Cardiovascular;  Laterality: N/A;PVI and CTI by Dr Rayann Heman   . BREAST BIOPSY Left    benign  . BREAST BIOPSY  2011   benign  . CATARACT EXTRACTION BILATERAL W/ ANTERIOR VITRECTOMY    . COLONOSCOPY    . ESOPHAGOGASTRODUODENOSCOPY    . KNEE ARTHROSCOPY Right 2012  . KNEE SURGERY Right 08/22/2018  . LOOP RECORDER INSERTION N/A 05/24/2017   Procedure: LOOP RECORDER INSERTION;  Surgeon: [name], MD;  Location: [place] CV LAB;  Service: Cardiovascular;  Laterality: N/A;  . OOPHORECTOMY     done for cyst on ovaries.   [name] Kitchen ROTATOR CUFF REPAIR  08/2001   Dr. Gladstone Lighter  . SVT ABLATION N/A 07/20/2017   Procedure: SVT ABLATION;  Surgeon: [name], MD;  Location: [place] CV LAB;  Service: Cardiovascular;  Laterality: N/A;  . TEE WITHOUT CARDIOVERSION N/A 04/29/2013   Procedure: TRANSESOPHAGEAL ECHOCARDIOGRAM (TEE);  Surgeon: Jettie Booze, MD;  Location: [facility];  Service: Cardiovascular;  Laterality: N/A;  . TOTAL KNEE ARTHROPLASTY Right 06/10/2019   Procedure: TOTAL KNEE ARTHROPLASTY;  Surgeon: Gaynelle Arabian, MD;  Location: [facility];  Service: Orthopedics;  Laterality: Right;  . UPPER GASTROINTESTINAL ENDOSCOPY      There were no  vitals filed for this visit.  Subjective Assessment - 08/02/19 1020    Subjective  i have been doing my exercises faithfully. I am trying to walk without my cane . Night time pain is much better. I just feel tight today.    Currently in Pain?  No/denies   Knee just feels tight.   Multiple Pain Sites  No         [facility] PT Assessment - 08/02/19 0001      AROM   AROM Assessment Site  Knee    Right/Left Knee  Right    Right Knee Extension  -15   active, passive 10 degrees   Right Knee Flexion  119                   [facility] Adult PT Treatment/Exercise - 08/02/19 0001      Knee/Hip Exercises: Aerobic   Nustep  L3 x 10 min with discussion on status and how the last week has been.       Knee/Hip  Exercises: Machines for Strengthening   Cybex Leg Press  Seat 6: Bil 55# 10x, 60# 10x, RTLE 25#    Vc to contract quad more, relax glute     Knee/Hip Exercises: Standing   Terminal Knee Extension  Strengthening;Right;2 sets;10 reps;Theraband    Theraband Level (Terminal Knee Extension)  Level 2 (Red)      Knee/Hip Exercises: Seated   Long Arc Quad  AROM;Strengthening;Right;2 sets;10 reps   2x 20   Long Arc Quad Limitations  Improving extension      Vasopneumatic   Number Minutes Vasopneumatic   10 minutes    Vasopnuematic Location   Knee    Vasopneumatic Pressure  Medium    Vasopneumatic Temperature   3 snowflakes      Manual Therapy   Manual therapy comments  patellar mobs and PROM into extension               PT Short Term Goals - 07/10/19 1547      PT SHORT TERM GOAL #2   Title  Pt will be able to complete sit to stand from a chair with or without UE support and equal weight-bearing through the LEs throughout her session.    Baseline  Lt>Rt weightbearing due to Lt knee flexion    Time  4    Period  Weeks    Status  Achieved        PT Long Term Goals - 07/15/19 1621      PT LONG TERM GOAL #1   Title  Pt will be able to complete 5x sit to stand in less than 14 sec which will reflect improvements in functional strength.    Baseline  22 seconds    Status  Achieved      PT LONG TERM GOAL #2   Title  Pt will be able to ascend and descend clinic steps atleast 2/3 trials with no more than 1 handrail and reciprocal pattern.    Baseline  moderate UE support bilateally with ascending    Status  On-going      PT LONG TERM GOAL #3   Title  Pt will have atleast 110 deg of Rt active knee flexion full extension to improve walking mechanics and efficiency with daily tasks.    Baseline  -8 to 112, met for flexion    Status  On-going      PT LONG TERM GOAL #4   Title  Pt will  be able to complete TUG with LRAD in less than 20 sec to reflect improvements in her mobility.     Status  On-going            Plan - 08/02/19 1018    Clinical Impression Statement  Pt arrives today without pain but a stiffness to her knee. She took last week off to do her HEP. Her flexion measurement improved yet her extension AROM does not measure better at lewast activlely. Her LAQ visually looked improved as she was definitely straightening her leg better during that exercise. We added TKe to her HEP, she is unsure she has a place for this at home but she wil try and let us know how that goes.    Personal Factors and Comorbidities  Age;Fitness    Examination-Activity Limitations  Squat;Stairs    Stability/Clinical Decision Making  Stable/Uncomplicated    Rehab Potential  Good    PT Frequency  2x / week    PT Duration  8 weeks    PT Treatment/Interventions  ADLs/Self Care Home Management;Electrical Stimulation;Therapeutic activities;Therapeutic exercise;Neuromuscular re-education;Balance training;Patient/family education;Manual techniques;Taping;Dry needling;Passive range of motion;Scar mobilization    PT Next Visit Plan  Review TKE ex see if it was successful at home. Continue to work on extension strength and ROM.    PT Home Exercise Plan  HEP from hospital: quad sets, heel slides (seated and supine), long arc quads, ankle pumps   Access Code: [email or web address]    Consulted and Agree with Plan of Care  Patient       Patient will benefit from skilled therapeutic intervention in order to improve the following deficits and impairments:  Abnormal gait, Decreased balance, Decreased endurance, Decreased mobility, Difficulty walking, Decreased strength, Decreased activity tolerance, Decreased range of motion, Increased edema, Pain, Improper body mechanics, Increased muscle spasms  Visit Diagnosis: Stiffness of right knee, not elsewhere classified  Acute pain of right knee  Other abnormalities of gait and mobility     Problem List Patient Active Problem List   Diagnosis Date Noted  .  OA (osteoarthritis) of knee 06/10/2019  . Tachyarrhythmia 02/14/2018  . Left hip pain 03/28/2016  . Atrial fibrillation ([place]) 03/31/2014  . Hot flashes - on low dose HRT with gynecologist, Dr. Nori Riis 03/31/2014  . Hypothyroidism 07/28/2007    Nneoma Harral, PTA 08/02/2019, 10:58 AM  [facility] [address], [place], [place], [address] Phone: [phone]   Fax:  [phone]  Name: Marissa Hart MRN: [id number] Date of Birth: 1935-06-10  Access Code: [email or web address]  URL: [email or web address]  Date: 08/02/2019  Prepared by: Myrene Galas   Exercises  Seated Knee Extension Stretch with Chair - 3 reps - 60 hold - 2x daily - 7x weekly  Supine Knee Extension Stretch on Towel Roll - 10 reps - 1 sets - 30 hold - 2x daily - 7x weekly  Standing Hip Abduction with Counter Support - 10 reps - 2 sets - 2x daily - 7x weekly  Standing Gastroc Stretch - 5 reps - 1 sets - 20 hold - 2x daily - 7x weekly  Modified Thomas Stretch - 10 reps - 3 sets - 1x daily - 7x weekly  Seated Knee Flexion Extension AROM - 10 reps - 1 sets - 10 hold - 2x daily - 7x weekly  Supine Heel Slide with Strap - 10 reps - 1 sets - 10 hold - 2x daily - 7x weekly  Standing Knee Flexion Stretch on Step - 10  reps - 1 sets - 10 hold - 2x daily - 7x weekly  Standing Terminal Knee Extension with Resistance - 10 reps - 3 sets - 5 hold - 2x daily - 7x weekly

## 2019-08-05 ENCOUNTER — Other Ambulatory Visit: Payer: Self-pay

## 2019-08-05 ENCOUNTER — Ambulatory Visit: Payer: Medicare Other | Admitting: Physical Therapy

## 2019-08-05 ENCOUNTER — Encounter: Payer: Self-pay | Admitting: Physical Therapy

## 2019-08-05 DIAGNOSIS — M25561 Pain in right knee: Secondary | ICD-10-CM | POA: Diagnosis not present

## 2019-08-05 DIAGNOSIS — R2689 Other abnormalities of gait and mobility: Secondary | ICD-10-CM

## 2019-08-05 DIAGNOSIS — M25661 Stiffness of right knee, not elsewhere classified: Secondary | ICD-10-CM | POA: Diagnosis not present

## 2019-08-05 NOTE — Therapy (Signed)
[facility] [address] [place], [place], [address] Phone: [phone]   Fax:  [phone]  Physical Therapy Treatment  Patient Details  Name: Marissa Hart MRN: [id number] Date of Birth: [birth year] Referring Provider (PT): Ardeen Jourdain, [place]   Encounter Date: 08/05/2019  PT End of Session - 08/05/19 1528    Visit Number  18    Date for PT Re-Evaluation  08/09/19    Authorization Type  BCBS Medicare- KX modifier needed    Authorization Time Period  06/14/19 to 08/09/19    PT Start Time  1527    PT Stop Time  1625    PT Time Calculation (min)  58 min    Activity Tolerance  Patient tolerated treatment well    Behavior During Therapy  [facility] for tasks assessed/performed       Past Medical History:  Diagnosis Date  . Acquired renal cyst of right kidney   . Anxiety   . Atrial fibrillation and flutter ([place])    s/p PVI and CTI by Dr Rayann Heman 04/2013  . Blood transfusion   . Cataract   . CHEST WALL PAIN, HX OF 07/28/2007   Qualifier: Diagnosis of  By: Julien Girt CMA, [name]    . Diverticulosis of colon   . DJD (degenerative joint disease) 10/18/2011  . Erysipelas   . Fibromyalgia   . FIBROMYALGIA 07/28/2007   Qualifier: Diagnosis of  By: Julien Girt CMA, [name]    . GERD (gastroesophageal reflux disease)   . Hiatal hernia   . Hot flashes - on low dose HRT with gynecologist, Dr. Nori Riis 03/31/2014  . Hx of colonic polyps   . Hyperlipidemia   . MITRAL VALVE PROLAPSE 07/28/2007   Qualifier: Diagnosis of  By: Julien Girt CMA, [name]    . PONV (postoperative nausea and vomiting)   . Supraventricular tachycardia ([place])    AV nodal reentry  s/p RFCA 2001  . Thyroid disease     Past Surgical History:  Procedure Laterality Date  . ABDOMINAL HYSTERECTOMY     heavy menses, prolonged periods. no cancer. cervix remained.   . ATRIAL FIBRILLATION ABLATION N/A 04/30/2013   Procedure: ATRIAL FIBRILLATION ABLATION;  Surgeon: [name], MD;   [facility]: [facility] CATH LAB;  Service: Cardiovascular;  Laterality: N/A;PVI and CTI by Dr Rayann Heman   . BREAST BIOPSY Left    benign  . BREAST BIOPSY  2011   benign  . CATARACT EXTRACTION BILATERAL W/ ANTERIOR VITRECTOMY    . COLONOSCOPY    . ESOPHAGOGASTRODUODENOSCOPY    . KNEE ARTHROSCOPY Right 2012  . KNEE SURGERY Right 08/22/2018  . LOOP RECORDER INSERTION N/A 05/24/2017   Procedure: LOOP RECORDER INSERTION;  Surgeon: [name], MD;  Location: [place] [facility];  Service: Cardiovascular;  Laterality: N/A;  . OOPHORECTOMY     done for cyst on ovaries.   [name] Kitchen ROTATOR CUFF REPAIR  08/2001   Dr. Gladstone Lighter  . SVT ABLATION N/A 07/20/2017   Procedure: SVT ABLATION;  Surgeon: [name], MD;  Location: [place] CV LAB;  Service: Cardiovascular;  Laterality: N/A;  . TEE WITHOUT CARDIOVERSION N/A 04/29/2013   Procedure: TRANSESOPHAGEAL ECHOCARDIOGRAM (TEE);  Surgeon: Jettie Booze, MD;  Location: [place];  Service: Cardiovascular;  Laterality: N/A;  . TOTAL KNEE ARTHROPLASTY Right 06/10/2019   Procedure: TOTAL KNEE ARTHROPLASTY;  Surgeon: Gaynelle Arabian, MD;  Location: [facility];  Service: Orthopedics;  Laterality: Right;  . UPPER GASTROINTESTINAL ENDOSCOPY      There were no  vitals filed for this visit.  Subjective Assessment - 08/05/19 1532    Subjective  My knees really hurt the night after my last appt. I think we should have not increased the weight on the press machine. The pain is better now.    Pertinent History  mitral valve prolapse, a-fib    Currently in Pain?  Yes    Pain Score  2     Pain Location  Knee    Pain Orientation  Right    Pain Descriptors / Indicators  Sore    Aggravating Factors   Maybe after the leg press last time    Pain Relieving Factors  ice, meds    Multiple Pain Sites  No         [facility] PT Assessment - 08/05/19 0001      AROM   AROM Assessment Site  Knee    Right/Left Knee  Right    Right Knee Extension  --   Did not measure after flexion    Right Knee Flexion  120                   [facility] Adult PT Treatment/Exercise - 08/05/19 0001      Knee/Hip Exercises: Aerobic   Nustep  L3 x 10 min with discussion on status.       Knee/Hip Exercises: Machines for Strengthening   Cybex Leg Press  Seat 6: BilLE 55# 10x, RTLE 20# 2x 10   lowered wt pt request      Knee/Hip Exercises: Standing   Terminal Knee Extension  Strengthening;Right;2 sets;15 reps;Theraband    Theraband Level (Terminal Knee Extension)  Level 2 (Red)    Terminal Knee Extension Limitations  Re-instruction for proper technique, TC to stabilize pelvis    Rebounder  wt shift 3 ways 1 min each No UE      Knee/Hip Exercises: Seated   Long Arc Quad  AROM;Strengthening;Right;2 sets;10 reps   2x 20   Long Arc Quad Weight  1 lbs.   on second set   Clamshell with TheraBand  Red   25x     Vasopneumatic   Number Minutes Vasopneumatic   10 minutes    Vasopnuematic Location   Knee    Vasopneumatic Pressure  Medium    Vasopneumatic Temperature   3 snowflakes      Manual Therapy   Manual therapy comments  patellar mobs and PROM into extension    Soft tissue mobilization  Distal scar mobs               PT Short Term Goals - 07/10/19 1547      PT SHORT TERM GOAL #2   Title  Pt will be able to complete sit to stand from a chair with or without UE support and equal weight-bearing through the LEs throughout her session.    Baseline  Lt>Rt weightbearing due to Lt knee flexion    Time  4    Period  Weeks    Status  Achieved        PT Long Term Goals - 08/05/19 1612      PT LONG TERM GOAL #3   Title  Pt will have atleast 110 deg of Rt active knee flexion full extension to improve walking mechanics and efficiency with daily tasks.    Time  8    Period  Weeks    Status  Partially Met   Active flexion 120 degrees post scar mobs  Plan - 08/05/19 1549    Clinical Impression Statement  Pt arrives today without AD but decreased step  length, knee extension and at times no rolling through her ankle. Pt required to be reinstructed in her TKE exercise. Today she demonstrates the very beginning of active ROM into TKE. After distal scar work pt could flex her knee to 120 degrees easily. LAQ continues its inprovement so we added light resistance to work on her quad strength. Medial knee "catch" intermittently throughout the session which seems to coincide with a good quad contraction.    Personal Factors and Comorbidities  Age;Fitness    Examination-Activity Limitations  Squat;Stairs    Stability/Clinical Decision Making  Stable/Uncomplicated    Rehab Potential  Good    PT Frequency  2x / week    PT Duration  8 weeks    PT Treatment/Interventions  ADLs/Self Care Home Management;Electrical Stimulation;Therapeutic activities;Therapeutic exercise;Neuromuscular re-education;Balance training;Patient/family education;Manual techniques;Taping;Dry needling;Passive range of motion;Scar mobilization    PT Next Visit Plan  ERO visit next, measure, MMT, FOTO: discuss DC plan. Consider aquatic PT for 4 visits to work on gait and strength with less WB, hydrostaticpressure may be good for knee swelling.    PT Home Exercise Plan  HEP from hospital: quad sets, heel slides (seated and supine), long arc quads, ankle pumps   Access Code: [email or web address]    Consulted and Agree with Plan of Care  Patient       Patient will benefit from skilled therapeutic intervention in order to improve the following deficits and impairments:  Abnormal gait, Decreased balance, Decreased endurance, Decreased mobility, Difficulty walking, Decreased strength, Decreased activity tolerance, Decreased range of motion, Increased edema, Pain, Improper body mechanics, Increased muscle spasms  Visit Diagnosis: Stiffness of right knee, not elsewhere classified  Acute pain of right knee  Other abnormalities of gait and mobility     Problem List Patient Active Problem List    Diagnosis Date Noted  . OA (osteoarthritis) of knee 06/10/2019  . Tachyarrhythmia 02/14/2018  . Left hip pain 03/28/2016  . Atrial fibrillation ([place]) 03/31/2014  . Hot flashes - on low dose HRT with gynecologist, Dr. Nori Riis 03/31/2014  . Hypothyroidism 07/28/2007    COCHRAN,JENNIFER, PTA 08/05/2019, 4:17 PM  [facility] [address] [place], [place], [address] Phone: [phone]   Fax:  [phone]  Name: Marissa Hart MRN: [id number] Date of Birth: [birth year]

## 2019-08-07 ENCOUNTER — Other Ambulatory Visit: Payer: Self-pay

## 2019-08-07 ENCOUNTER — Ambulatory Visit: Payer: Medicare Other

## 2019-08-07 DIAGNOSIS — M25561 Pain in right knee: Secondary | ICD-10-CM | POA: Diagnosis not present

## 2019-08-07 DIAGNOSIS — M25661 Stiffness of right knee, not elsewhere classified: Secondary | ICD-10-CM | POA: Diagnosis not present

## 2019-08-07 DIAGNOSIS — R2689 Other abnormalities of gait and mobility: Secondary | ICD-10-CM

## 2019-08-07 NOTE — Therapy (Signed)
[facility] [address], [place], [place], [address] Phone: [phone]   Fax:  [phone]  Physical Therapy Treatment  Patient Details  Name: Marissa Hart MRN: [id number] Date of Birth: 01-May-1935 Referring Provider (PT): Ardeen Jourdain, [place]   Encounter Date: 08/07/2019  PT End of Session - 08/07/19 1313    Visit Number  19    Authorization Type  BCBS Medicare- KX modifier needed    PT Start Time  6226    PT Stop Time  1322    PT Time Calculation (min)  51 min    Activity Tolerance  Patient tolerated treatment well    Behavior During Therapy  [facility] for tasks assessed/performed       Past Medical History:  Diagnosis Date  . Acquired renal cyst of right kidney   . Anxiety   . Atrial fibrillation and flutter ([place])    s/p PVI and CTI by Dr Rayann Heman 04/2013  . Blood transfusion   . Cataract   . CHEST WALL PAIN, HX OF 07/28/2007   Qualifier: Diagnosis of  By: Julien Girt CMA, [name]    . Diverticulosis of colon   . DJD (degenerative joint disease) 10/18/2011  . Erysipelas   . Fibromyalgia   . FIBROMYALGIA 07/28/2007   Qualifier: Diagnosis of  By: Julien Girt CMA, [name]    . GERD (gastroesophageal reflux disease)   . Hiatal hernia   . Hot flashes - on low dose HRT with gynecologist, Dr. Nori Riis 03/31/2014  . Hx of colonic polyps   . Hyperlipidemia   . MITRAL VALVE PROLAPSE 07/28/2007   Qualifier: Diagnosis of  By: Julien Girt CMA, [name]    . PONV (postoperative nausea and vomiting)   . Supraventricular tachycardia ([place])    AV nodal reentry  s/p RFCA 2001  . Thyroid disease     Past Surgical History:  Procedure Laterality Date  . ABDOMINAL HYSTERECTOMY     heavy menses, prolonged periods. no cancer. cervix remained.   . ATRIAL FIBRILLATION ABLATION N/A 04/30/2013   Procedure: ATRIAL FIBRILLATION ABLATION;  Surgeon: [name], MD;  Location: [facility] CATH LAB;  Service: Cardiovascular;  Laterality: N/A;PVI and CTI by Dr Rayann Heman    . BREAST BIOPSY Left    benign  . BREAST BIOPSY  2011   benign  . CATARACT EXTRACTION BILATERAL W/ ANTERIOR VITRECTOMY    . COLONOSCOPY    . ESOPHAGOGASTRODUODENOSCOPY    . KNEE ARTHROSCOPY Right 2012  . KNEE SURGERY Right 08/22/2018  . LOOP RECORDER INSERTION N/A 05/24/2017   Procedure: LOOP RECORDER INSERTION;  Surgeon: [name], MD;  Location: [place] CV LAB;  Service: Cardiovascular;  Laterality: N/A;  . OOPHORECTOMY     done for cyst on ovaries.   [name] Kitchen ROTATOR CUFF REPAIR  08/2001   Dr. Gladstone Lighter  . SVT ABLATION N/A 07/20/2017   Procedure: SVT ABLATION;  Surgeon: [name], MD;  Location: [place] CV LAB;  Service: Cardiovascular;  Laterality: N/A;  . TEE WITHOUT CARDIOVERSION N/A 04/29/2013   Procedure: TRANSESOPHAGEAL ECHOCARDIOGRAM (TEE);  Surgeon: Jettie Booze, MD;  Location: [place];  Service: Cardiovascular;  Laterality: N/A;  . TOTAL KNEE ARTHROPLASTY Right 06/10/2019   Procedure: TOTAL KNEE ARTHROPLASTY;  Surgeon: Gaynelle Arabian, MD;  Location: [facility];  Service: Orthopedics;  Laterality: Right;  . UPPER GASTROINTESTINAL ENDOSCOPY      There were no vitals filed for this visit.  Subjective Assessment - 08/07/19 1236    Currently in Pain?  Yes  Pain Score  2     Pain Location  Knee    Pain Orientation  Right    Pain Descriptors / Indicators  Sore    Pain Type  Surgical pain    Pain Onset  More than a month ago    Pain Frequency  Intermittent    Aggravating Factors   night, walking, standing    Pain Relieving Factors  ice, medication         [facility] PT Assessment - 08/07/19 0001      Assessment   Medical Diagnosis  Rt TKA    Referring Provider (PT)  Ardeen Jourdain, PA-C    Onset Date/Surgical Date  06/10/19      Prior Function   Level of Independence  Independent      AROM   AROM Assessment Site  Knee    Right/Left Knee  Right    Right Knee Extension  -12    Right Knee Flexion  115      Strength   Overall Strength Comments  5/5  Rt knee strength with lag due to limited A/ROM extension      Palpation   Palpation comment  palpable tenderness over Rt medial knee joint      Ambulation/Gait   Ambulation/Gait  Yes    Gait Pattern  Step-through pattern;Decreased stance time - right    Stairs  Yes    Stairs Assistance  7: Independent    Stair Management Technique  One rail Right;Alternating pattern    Gait Comments  antalgic due to reduced Rt knee extension with heel strike                   [facility] Adult PT Treatment/Exercise - 08/07/19 0001      Knee/Hip Exercises: Aerobic   Nustep  L3 x 10 min with discussion on status.       Knee/Hip Exercises: Machines for Strengthening   Cybex Leg Press  Seat 6: BilLE 55# 10x, RTLE 20# 2x 10   lowered wt pt request      Knee/Hip Exercises: Standing   Rebounder  wt shift 3 ways 1 min each No UE      Knee/Hip Exercises: Seated   Long Arc Quad  AROM;Strengthening;Right;2 sets;10 reps   2x 20   Long Arc Quad Weight  1 lbs.   on second set     Vasopneumatic   Number Minutes Vasopneumatic   10 minutes    Vasopnuematic Location   Knee    Vasopneumatic Pressure  Medium    Vasopneumatic Temperature   3 snowflakes               PT Short Term Goals - 07/10/19 1547      PT SHORT TERM GOAL #2   Title  Pt will be able to complete sit to stand from a chair with or without UE support and equal weight-bearing through the LEs throughout her session.    Baseline  Lt>Rt weightbearing due to Lt knee flexion    Time  4    Period  Weeks    Status  Achieved        PT Long Term Goals - 08/07/19 1239      PT LONG TERM GOAL #1   Title  Pt will be able to complete 5x sit to stand in less than 14 sec which will reflect improvements in functional strength.    Baseline  9.59    Status  Achieved  PT LONG TERM GOAL #2   Title  Pt will be able to ascend and descend clinic steps atleast 2/3 trials with no more than 1 handrail and reciprocal pattern.    Status   Achieved      PT LONG TERM GOAL #3   Title  Pt will have atleast 110 deg of Rt active knee flexion full extension to improve walking mechanics and efficiency with daily tasks.    Baseline  115 flexion, extension lacking 12 degrees    Status  Partially Met      PT LONG TERM GOAL #4   Title  Pt will be able to complete TUG with LRAD in less than 20 sec to reflect improvements in her mobility.    Status  Achieved            Plan - 08/07/19 1258    Clinical Impression Statement  Pt is ready for D/C from HEP.  Pt 2/10 Rt medial knee pain and demonstrates 5/5 knee strength within available range.  Pt with reduced Rt knee extension with lacking 12 degrees of active knee extension.  This limitation contributes to antalgic gait pattern.  5x sit to stand is 9.56 seconds and this is normal for her age and does not indicate a falls risk.  PT emphasized the importance of continuing to perform exercises at home with emphasis on extension to normalize gait.  Pt will follow-up with MD next week.    PT Next Visit Plan  D/C PT to HEP       Patient will benefit from skilled therapeutic intervention in order to improve the following deficits and impairments:     Visit Diagnosis: Stiffness of right knee, not elsewhere classified  Acute pain of right knee  Other abnormalities of gait and mobility     Problem List Patient Active Problem List   Diagnosis Date Noted  . OA (osteoarthritis) of knee 06/10/2019  . Tachyarrhythmia 02/14/2018  . Left hip pain 03/28/2016  . Atrial fibrillation ([place]) 03/31/2014  . Hot flashes - on low dose HRT with gynecologist, Dr. Nori Riis 03/31/2014  . Hypothyroidism 07/28/2007    PHYSICAL THERAPY DISCHARGE SUMMARY  Visits from Start of Care: 19  Current functional level related to goals / functional outcomes: See above for most current status.  Pt with reduced Rt knee A/ROM extension that alters her gait pattern.  Pt has HEP in place to address this.      Remaining deficits: See above.     Education / Equipment: HEP, importance of working to improve extension Plan: Patient agrees to discharge.  Patient goals were met. Patient is being discharged due to being pleased with the current functional level.  ?????        Sigurd Sos, PT 08/07/19 1:17 PM  [facility] [address] [place], [place], [address] Phone: [phone]   Fax:  [phone]  Name: VERLON CARCIONE MRN: [id number] Date of Birth: 12/24/1934

## 2019-08-13 ENCOUNTER — Ambulatory Visit (INDEPENDENT_AMBULATORY_CARE_PROVIDER_SITE_OTHER): Payer: Medicare Other | Admitting: *Deleted

## 2019-08-13 DIAGNOSIS — R000 Tachycardia, unspecified: Secondary | ICD-10-CM

## 2019-08-13 LAB — CUP PACEART REMOTE DEVICE CHECK
Date Time Interrogation Session: 20201124105843
Implantable Pulse Generator Implant Date: 20180905

## 2019-09-10 NOTE — Progress Notes (Signed)
ILR remote 

## 2019-09-16 ENCOUNTER — Ambulatory Visit (INDEPENDENT_AMBULATORY_CARE_PROVIDER_SITE_OTHER): Payer: Medicare Other | Admitting: *Deleted

## 2019-09-16 DIAGNOSIS — R000 Tachycardia, unspecified: Secondary | ICD-10-CM | POA: Diagnosis not present

## 2019-09-16 LAB — CUP PACEART REMOTE DEVICE CHECK
Date Time Interrogation Session: 20201227105646
Implantable Pulse Generator Implant Date: 20180905

## 2019-09-18 DIAGNOSIS — D485 Neoplasm of uncertain behavior of skin: Secondary | ICD-10-CM | POA: Diagnosis not present

## 2019-09-18 DIAGNOSIS — L820 Inflamed seborrheic keratosis: Secondary | ICD-10-CM | POA: Diagnosis not present

## 2019-09-18 DIAGNOSIS — L821 Other seborrheic keratosis: Secondary | ICD-10-CM | POA: Diagnosis not present

## 2019-09-18 DIAGNOSIS — L718 Other rosacea: Secondary | ICD-10-CM | POA: Diagnosis not present

## 2019-10-03 DIAGNOSIS — Z0120 Encounter for dental examination and cleaning without abnormal findings: Secondary | ICD-10-CM | POA: Diagnosis not present

## 2019-10-08 ENCOUNTER — Ambulatory Visit (INDEPENDENT_AMBULATORY_CARE_PROVIDER_SITE_OTHER): Payer: Medicare Other | Admitting: Family Medicine

## 2019-10-08 ENCOUNTER — Other Ambulatory Visit: Payer: Self-pay

## 2019-10-08 ENCOUNTER — Encounter: Payer: Self-pay | Admitting: Family Medicine

## 2019-10-08 DIAGNOSIS — E039 Hypothyroidism, unspecified: Secondary | ICD-10-CM | POA: Diagnosis not present

## 2019-10-08 DIAGNOSIS — Z0000 Encounter for general adult medical examination without abnormal findings: Secondary | ICD-10-CM

## 2019-10-08 DIAGNOSIS — E785 Hyperlipidemia, unspecified: Secondary | ICD-10-CM

## 2019-10-08 DIAGNOSIS — I480 Paroxysmal atrial fibrillation: Secondary | ICD-10-CM | POA: Diagnosis not present

## 2019-10-08 DIAGNOSIS — R739 Hyperglycemia, unspecified: Secondary | ICD-10-CM

## 2019-10-08 DIAGNOSIS — D369 Benign neoplasm, unspecified site: Secondary | ICD-10-CM

## 2019-10-08 NOTE — Progress Notes (Signed)
Medicare Annual Preventive Care Visit  (initial annual wellness or annual wellness exam)  Virtual Visit via Video Note  I connected with Marissa Hart by a video enabled telemedicine application and verified that I am speaking with the correct person using two identifiers.  Location patient: home Location provider:work or home office Persons participating in the virtual visit: patient, provider  Concerns and/or follow up today:  Marissa Hart has a PMH hypothyroidism, Fibromyalgia, Dyslipidemia and A. Fib. She sees cardiology for management of her heart condition. New PCP is Dr. Ethlyn Gallery. Has occ glass of wine, exercising. She had knee replacement surgery a few months ago. She feels she is recovering well. Can walk better now.   She is getting her COVID19 shot on the 29th.  See HM section in Epic for other details of completed HM. See scanned documentation under Media Tab for further documentation HPI, health risk assessment. See Media Tab and Care Teams sections in Epic for other providers.  ROS: negative for report of fevers, unintentional weight loss, vision changes, vision loss, hearing loss or change, chest pain, sob, hemoptysis, melena, hematochezia, hematuria, genital discharge or lesions, falls, bleeding or bruising, loc, thoughts of suicide or self harm, memory loss  1.) Patient-completed health risk assessment  - completed and reviewed, see scanned documentation  2.) Review of Medical History: -PMH, PSH, Family History and current specialty and care providers reviewed and updated and listed below  - see scanned in document in chart and below Patient Care Team: Caren Macadam, MD as PCP - General (Family Medicine) [name], MD as PCP - Cardiology (Cardiology) Maisie Fus, MD as Consulting Physician (Obstetrics and Gynecology) [name], MD as Consulting Physician (Cardiology) Gaynelle Arabian, MD as Consulting Physician (Orthopedic Surgery)   Past Medical History:   Diagnosis Date  . Acquired renal cyst of right kidney   . Anxiety   . Atrial fibrillation and flutter ([place])    s/p PVI and CTI by Dr Rayann Heman 04/2013  . Blood transfusion   . Cataract   . CHEST WALL PAIN, HX OF 07/28/2007   Qualifier: Diagnosis of  By: Julien Girt CMA, [name]    . Diverticulosis of colon   . DJD (degenerative joint disease) 10/18/2011  . Erysipelas   . Fibromyalgia   . FIBROMYALGIA 07/28/2007   Qualifier: Diagnosis of  By: Julien Girt CMA, [name]    . GERD (gastroesophageal reflux disease)   . Hiatal hernia   . Hot flashes - on low dose HRT with gynecologist, Dr. Nori Riis 03/31/2014  . Hx of colonic polyps   . Hyperlipidemia   . MITRAL VALVE PROLAPSE 07/28/2007   Qualifier: Diagnosis of  By: Julien Girt CMA, [name]    . PONV (postoperative nausea and vomiting)   . Supraventricular tachycardia ([place])    AV nodal reentry  s/p RFCA 2001  . Thyroid disease     Past Surgical History:  Procedure Laterality Date  . ABDOMINAL HYSTERECTOMY     heavy menses, prolonged periods. no cancer. cervix remained.   . ATRIAL FIBRILLATION ABLATION N/A 04/30/2013   Procedure: ATRIAL FIBRILLATION ABLATION;  Surgeon: [name], MD;  [facility] CATH LAB;  Service: Cardiovascular;  Laterality: N/A;PVI and CTI by Dr Rayann Heman   . BREAST BIOPSY Left    benign  . BREAST BIOPSY  2011   benign  . CATARACT EXTRACTION BILATERAL W/ ANTERIOR VITRECTOMY    . COLONOSCOPY    . ESOPHAGOGASTRODUODENOSCOPY    . KNEE ARTHROSCOPY Right 2012  . KNEE SURGERY Right 08/22/2018  .  LOOP RECORDER INSERTION N/A 05/24/2017   Procedure: LOOP RECORDER INSERTION;  Surgeon: [name], MD;  Location: [place] [facility] LAB;  Service: Cardiovascular;  Laterality: N/A;  . OOPHORECTOMY     done for cyst on ovaries.   [name] Kitchen ROTATOR CUFF REPAIR  08/2001   Dr. Gladstone Lighter  . SVT ABLATION N/A 07/20/2017   Procedure: SVT ABLATION;  Surgeon: [name], MD;  Location: [place] CV LAB;  Service: Cardiovascular;  Laterality: N/A;  . TEE WITHOUT  CARDIOVERSION N/A 04/29/2013   Procedure: TRANSESOPHAGEAL ECHOCARDIOGRAM (TEE);  Surgeon: Jettie Booze, MD;  Location: [place];  Service: Cardiovascular;  Laterality: N/A;  . TOTAL KNEE ARTHROPLASTY Right 06/10/2019   Procedure: TOTAL KNEE ARTHROPLASTY;  Surgeon: Gaynelle Arabian, MD;  [facility];  Service: Orthopedics;  Laterality: Right;  . UPPER GASTROINTESTINAL ENDOSCOPY      Social History   Socioeconomic History  . Marital status: Widowed    Spouse name: Not on file  . Number of children: 1  . Years of education: Not on file  . Highest education level: Not on file  Occupational History    Employer: RETIRED  Tobacco Use  . Smoking status: Former Smoker    Packs/day: 0.50    Years: 3.00    Pack years: 1.50    Types: Cigarettes    Quit date: 09/19/1966    Years since quitting: 53.0  . Smokeless tobacco: Never Used  Substance and Sexual Activity  . Alcohol use: Yes    Alcohol/week: 3.0 standard drinks    Types: 3 Glasses of wine per week    Comment: wine weekly  . Drug use: No  . Sexual activity: Not on file  Other Topics Concern  . 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 Difficulty of Paying Living Expenses: Not on file  Food Insecurity:   . Worried About Charity fundraiser in the Last Year: Not on file  . Ran Out of Food in the Last Year: Not on file  Transportation Needs:   . Lack of Transportation (Medical): Not  on file  . Lack of Transportation (Non-Medical): Not on file  Physical Activity:   . Days of Exercise per Week: Not on file  . Minutes of Exercise per Session: Not on file  Stress:   . Feeling of Stress : Not on file  Social Connections:   . Frequency of Communication with Friends and Family: Not on file  . Frequency of Social Gatherings with Friends and Family: Not on file  . Attends Religious Services: Not on file  . Active Member of Clubs or Organizations: Not on file  . Attends Archivist Meetings: Not on file  . Marital Status: Not on file  Intimate Partner Violence:   . Fear of Current or Ex-Partner: Not on file  . Emotionally Abused: Not on file  . Physically Abused: Not on file  . Sexually Abused: Not on file    Family History  Problem Relation Age of Onset  . Heart failure Mother        died suddenly with this  . Parkinson's disease Father   . Diabetes Brother   . Healthy Daughter     Current Outpatient Medications on File Prior to Visit  Medication Sig Dispense Refill  . aspirin 325 MG tablet Take 325 mg by mouth daily as needed (knee piain).     . Cyanocobalamin (B-12) 5000 MCG SUBL Place 5,000 mcg under the tongue daily.    [name] Kitchen docusate sodium (COLACE) 100 MG capsule Take 100 mg by mouth daily as needed for mild constipation.    [name] Kitchen estradiol (VIVELLE-DOT) 0.1 MG/24HR patch Place 0.25 patches onto the skin 2 (two) times a week. Wed and Sat    . gabapentin (NEURONTIN) 100 MG capsule Take 1 capsule (100 mg total) by mouth 3 (three) times daily. Take a 100 mg capsule three times a day for two weeks following surgery.Then take a 100 mg capsule two times a day for two weeks. Then take a 100 mg capsule once a day for two weeks. Then discontinue the Gabapentin. (Patient taking differently: Take 100 mg by mouth at bedtime. As needed) 84 capsule 0  . Hypromellose (ARTIFICIAL TEARS OP) Apply 1 drop to eye daily as needed (dry eyes).    [name] Kitchen levothyroxine (SYNTHROID) 88 MCG  tablet TAKE 88 MCG BY MOUTH 3 DAYS PER WEEK AND TAKE 75 MCG ON ALL OTHER DAYS (Patient taking differently: TAKE 88 MCG BY MOUTH 3 DAYS PER WEEK AND TAKE 75 MCG ON ALL OTHER DAYS) 36 tablet 1  . psyllium (METAMUCIL) 58.6 % powder Take 1 packet by mouth daily as needed (fiber).     [name] Kitchen SAVAYSA 60 MG TABS tablet Take 1 tablet by mouth once daily 90 tablet 3  . verapamil (CALAN-SR) 120 MG CR tablet Take 1 tablet by mouth once daily 90 tablet 1  . VITAMIN D, CHOLECALCIFEROL, PO Take 4,000 Units by mouth daily.     No current facility-administered medications on file prior to visit.     3.) Review of functional ability and level of safety:  Any difficulty hearing?  See scanned documentation - mild hearing issues. Started noticing this earlier this year. She prefers not to address this until the pandemic is over.   History of falling?  See scanned documentation  Any trouble with IADLs - using a phone, using transportation, grocery shopping, preparing meals, doing housework, doing laundry, taking medications and managing money?  See scanned documentation  Advance Directives? Has advanced directives..  See summary of recommendations in Patient Instructions below.  4.) Physical Exam There were no vitals filed for this visit. Estimated body mass index is 26.44 kg/m as calculated from the following:   Height as of 06/10/19: 5\' 2"  (1.575 m).   Weight as of 06/10/19: 144 lb 9 oz (65.6 kg).  EKG (optional): deferred  VITALS per patient if applicable:  GENERAL: alert, oriented, appears well and in no acute distress; visual acuity grossly intact, full vision exam deferred due to pandemic and/or virtual encounter  HEENT: atraumatic, conjunttiva clear, no obvious abnormalities on inspection of external nose and ears  NECK: normal movements of the head and neck  LUNGS: on inspection no signs of  respiratory distress, breathing rate appears normal, no obvious gross SOB, gasping or wheezing  CV: no  obvious cyanosis  MS: moves all visible extremities without noticeable abnormality  PSYCH/NEURO: pleasant and cooperative, no obvious depression or anxiety, speech and thought processing grossly intact, Cognitive function grossly intact  Depression screen [facility] 2/9 10/08/2019 11/20/2018 11/20/2018 11/20/2018 10/02/2018  Decreased Interest 0 0 0 0 0  Down, Depressed, Hopeless 0 0 0 0 0  PHQ - 2 Score 0 0 0 0 0  Altered sleeping - 0 0 - 1  Tired, decreased energy - 0 0 - 0  Change in appetite - 0 0 - 0  Feeling bad or failure about yourself  - 0 0 - 0  Trouble concentrating - 0 0 - 0  Moving slowly or fidgety/restless - 0 0 - 0  Suicidal thoughts - 0 0 - 0  PHQ-9 Score - 0 0 - 1     See patient instructions for recommendations.  Education and counseling regarding the above review of health provided with a plan for the following: -see scanned patient completed form for further details -fall prevention strategies discussed  -healthy lifestyle discussed -importance and resources for completing advanced directives discussed -see patient instructions below for any other recommendations provided  4)The following written screening schedule of preventive measures were reviewed with assessment and plan made per below, orders and patient instructions:        Alcohol screening done     Obesity Screening and counseling done     STI screening (Hep C if born [birth year]) offered and per pt wishes     Tobacco Screening done        Pneumococcal (PPSV23 -one dose after 64, one before if risk factors), influenza yearly and hepatitis B vaccines (if high risk - end stage renal disease, IV drugs, homosexual men, live in home for mentally retarded, hemophilia receiving factors) ASSESSMENT/PLAN: done if applicable      Screening mammograph (yearly if >40) ASSESSMENT/PLAN: utd, done 02/2019      Screening Pap smear/pelvic exam (q2 years) ASSESSMENT/PLAN: n/a, declined      Prostate cancer  screening ASSESSMENT/PLAN: n/a, declined      Colorectal cancer screening (FOBT yearly or flex sig q4y or colonoscopy q10y or barium enema q4y) ASSESSMENT/PLAN: had colonoscopy 11/2012 --> had small tubular adenoma and GI advised repeat in 5 years, then advised in 2019 an office visit to discuss.      Diabetes outpatient self-management training services ASSESSMENT/PLAN: utd or done      Bone mass measurements(covered q2y if indicated - estrogen def, osteoporosis, hyperparathyroid, vertebral abnormalities, osteoporosis or steroids) ASSESSMENT/PLAN: utd, [facility], per [facility] notes repeat in 5 years      Screening for glaucoma(q1y if high risk - diabetes, FH, AA and > 50 or hispanic and > 65) ASSESSMENT/PLAN: utd or advised      Medical nutritional therapy for individuals with diabetes or renal disease ASSESSMENT/PLAN: see orders      Cardiovascular screening blood tests (lipids q5y) ASSESSMENT/PLAN: see orders and labs Has not had lipids in a few years.      Diabetes screening tests ASSESSMENT/PLAN: see orders and labs, has not had hgba1c in some time   7.) Summary:  Medicare annual wellness visit, subsequent -risk factors and conditions per above assessment were discussed and treatment, recommendations and referrals were offered per documentation above and orders and patient instructions. -discussed COVID19 and Shingles vaccines, she plans to get the  COVID19 vaccines first and then the shingles about 2-4 weeks after that series.  Hypothyroidism, unspecified type -due for thyroid recheck, had small change in dose last check with PCP, offered lab orders for lab visit -she prefers to check when comes for her physical in a few months to avoid office exposure due to pandemic and will be fully vaccinated then  Paroxysmal atrial fibrillation [facility]) -sees cardiology for management, asymptomatic currently  Dyslipidemia -due for lipid check, but she  does not want to check as does not want to take any medication for it if elevated  Hyperglycemia -due for diabetes screening, mildly elevated glucose on last BMP -plans to check at CPE  Tubular adenoma -advised of past records and advised she call GI specialist today to schedule virtual visit to discuss further. She agreed to do so.  Follow up with me or PCP as scheduled for CPE  Patient Instructions    Ms. Sava , Thank you for taking time to come for your Medicare Wellness Visit. I appreciate your ongoing commitment to your health goals. Please review the following plan we discussed and let me know if I can assist you in the future.   Please continue to get regular exercise and eat health.  Postpone the shingles vaccines until 2-4 weeks after the COVID19 vaccine series.  Please Call your Gastroenterologist today to schedule a virtual visit to discus your history of a colon polyp. Let them know you missed the advised visit. Let us know if we can help.  Get your labs with your physical in March.   This is a list of the screening recommended for you and due dates:  Health Maintenance  Topic Date Due  . Mammogram  03/17/2020  . Tetanus Vaccine  11/29/2021  . DEXA scan (bone density measurement)  12/15/2022  . Flu Shot  Completed    Lucretia Kern, DO

## 2019-10-08 NOTE — Patient Instructions (Signed)
  Ms. Dauer , Thank you for taking time to come for your Medicare Wellness Visit. I appreciate your ongoing commitment to your health goals. Please review the following plan we discussed and let me know if I can assist you in the future.   Please continue to get regular exercise and eat health.  Postpone the shingles vaccines until 2-4 weeks after the COVID19 vaccine series.  Please Call your Gastroenterologist today to schedule a virtual visit to discus your history of a colon polyp. Let them know you missed the advised visit. Let us know if we can help.  Get your labs with your physical in March.   This is a list of the screening recommended for you and due dates:  Health Maintenance  Topic Date Due  . Mammogram  03/17/2020  . Tetanus Vaccine  11/29/2021  . DEXA scan (bone density measurement)  12/15/2022  . Flu Shot  Completed

## 2019-10-17 ENCOUNTER — Ambulatory Visit (INDEPENDENT_AMBULATORY_CARE_PROVIDER_SITE_OTHER): Payer: Medicare Other | Admitting: *Deleted

## 2019-10-17 DIAGNOSIS — R000 Tachycardia, unspecified: Secondary | ICD-10-CM

## 2019-10-17 LAB — CUP PACEART REMOTE DEVICE CHECK
Date Time Interrogation Session: 20210128023433
Implantable Pulse Generator Implant Date: 20180905

## 2019-10-17 NOTE — Progress Notes (Signed)
ILR Remote 

## 2019-10-18 ENCOUNTER — Ambulatory Visit: Payer: Medicare Other

## 2019-10-21 ENCOUNTER — Other Ambulatory Visit: Payer: Self-pay | Admitting: Internal Medicine

## 2019-10-21 MED ORDER — VERAPAMIL HCL ER 120 MG PO TBCR: 120.0000 mg | EXTENDED_RELEASE_TABLET | Freq: Every day | ORAL | 2 refills | Status: DC

## 2019-10-21 NOTE — Telephone Encounter (Signed)
Pt's medication was sent to pt's pharmacy as requested. Confirmation received.  °

## 2019-10-24 ENCOUNTER — Ambulatory Visit: Payer: Medicare Other | Attending: Internal Medicine

## 2019-10-24 DIAGNOSIS — Z23 Encounter for immunization: Secondary | ICD-10-CM

## 2019-10-24 NOTE — Progress Notes (Signed)
   Covid-19 Vaccination Clinic  Name:  [name]    MRN: [id number] DOB: [birth year]  10/24/2019  [name] was observed post Covid-19 immunization for 15 minutes without incidence. She was provided with Vaccine Information Sheet and instruction to access the V-Safe system.   [name] was instructed to call 911 with any severe reactions post vaccine: [name] Kitchen Difficulty breathing  . Swelling of your face and throat  . A fast heartbeat  . A bad rash all over your body  . Dizziness and weakness    Immunizations Administered    Name Date Dose VIS Date Route   Pfizer COVID-19 Vaccine 10/24/2019 10:39 AM 0.3 mL 08/30/2019 Intramuscular   Manufacturer: [place]   Lot: [id number]   [place]: [id number]

## 2019-11-17 ENCOUNTER — Ambulatory Visit (INDEPENDENT_AMBULATORY_CARE_PROVIDER_SITE_OTHER): Payer: Medicare Other | Admitting: *Deleted

## 2019-11-17 DIAGNOSIS — I480 Paroxysmal atrial fibrillation: Secondary | ICD-10-CM | POA: Diagnosis not present

## 2019-11-17 LAB — CUP PACEART REMOTE DEVICE CHECK
Date Time Interrogation Session: 20210228023358
Implantable Pulse Generator Implant Date: 20180905

## 2019-11-17 NOTE — Progress Notes (Signed)
ILR remote 

## 2019-11-18 ENCOUNTER — Ambulatory Visit: Payer: Medicare Other | Attending: Internal Medicine

## 2019-11-18 DIAGNOSIS — Z23 Encounter for immunization: Secondary | ICD-10-CM | POA: Insufficient documentation

## 2019-11-18 NOTE — Progress Notes (Signed)
   Covid-19 Vaccination Clinic  Name:  Marissa Hart    MRN: [id number] DOB: 01-Jun-1935  11/18/2019  [name] was observed post Covid-19 immunization for 15 minutes without incidence. She was provided with Vaccine Information Sheet and instruction to access the V-Safe system.   [name] was instructed to call 911 with any severe reactions post vaccine: [name] Kitchen Difficulty breathing  . Swelling of your face and throat  . A fast heartbeat  . A bad rash all over your body  . Dizziness and weakness    Immunizations Administered    Name Date Dose VIS Date Route   Pfizer COVID-19 Vaccine 11/18/2019 10:36 AM 0.3 mL 08/30/2019 Intramuscular   Manufacturer: [place]   Lot: [id number]   [place]: [id number]

## 2019-11-20 ENCOUNTER — Ambulatory Visit: Payer: Medicare Other | Admitting: Nurse Practitioner

## 2019-11-25 ENCOUNTER — Encounter: Payer: Self-pay | Admitting: Family Medicine

## 2019-11-25 ENCOUNTER — Ambulatory Visit (INDEPENDENT_AMBULATORY_CARE_PROVIDER_SITE_OTHER): Payer: Medicare Other | Admitting: Family Medicine

## 2019-11-25 ENCOUNTER — Other Ambulatory Visit: Payer: Self-pay

## 2019-11-25 VITALS — BP 120/78 | HR 77 | Temp 97.2°F | Ht 61.75 in | Wt 141.8 lb

## 2019-11-25 DIAGNOSIS — M791 Myalgia, unspecified site: Secondary | ICD-10-CM

## 2019-11-25 DIAGNOSIS — H9193 Unspecified hearing loss, bilateral: Secondary | ICD-10-CM

## 2019-11-25 DIAGNOSIS — Z0000 Encounter for general adult medical examination without abnormal findings: Secondary | ICD-10-CM | POA: Diagnosis not present

## 2019-11-25 DIAGNOSIS — T466X5A Adverse effect of antihyperlipidemic and antiarteriosclerotic drugs, initial encounter: Secondary | ICD-10-CM

## 2019-11-25 DIAGNOSIS — E039 Hypothyroidism, unspecified: Secondary | ICD-10-CM | POA: Diagnosis not present

## 2019-11-25 LAB — TSH: TSH: 0.16 u[IU]/mL — ABNORMAL LOW (ref 0.35–4.50)

## 2019-11-25 NOTE — Progress Notes (Addendum)
Marissa Hart DOB: 01-31-1935 Encounter date: 11/25/2019  This is a 85 y.o. female who presents for complete physical   History of present illness/Additional concerns: Has had knee replacement since our last visit 05/22/19. Right knee; still stiff and numb, esp in mornings. Close to 6 months post op. Not using cane now. She is gradually improving. Pain is better than before surgery; taking tylenol in the morning. Occasionally hurts at night. She is able to ride bicycle and is walking more. Doing more stretching exercises. Alternates stretching exercises daily.  Hypothyroid: Has been stable on Synthroid.  Mammogram completed 02/2019. DEXA completed 11/2017 (normal)  Follows with dermatology  Follows with gynecologist regularly (Dr. Nori Riis)  Follows with Dr. Rayann Heman cardiology for atrial fib. On savaysa. Wasn't able to tolerate statins in the past, niacin worsened a fib; had aches and pains with red yeast rice.  Has noted more difficulty with hearing in the last year.  Certainly worse with masks.  Past Medical History:  Diagnosis Date  . Acquired renal cyst of right kidney   . Anxiety   . Atrial fibrillation and flutter ([place])    s/p PVI and CTI by Dr Rayann Heman 04/2013  . Blood transfusion   . Cataract   . CHEST WALL PAIN, HX OF 07/28/2007   Qualifier: Diagnosis of  By: Julien Girt CMA, [name]    . Diverticulosis of colon   . DJD (degenerative joint disease) 10/18/2011  . Erysipelas   . Fibromyalgia   . FIBROMYALGIA 07/28/2007   Qualifier: Diagnosis of  By: Julien Girt CMA, [name]    . GERD (gastroesophageal reflux disease)   . Hiatal hernia   . Hot flashes - on low dose HRT with gynecologist, Dr. Nori Riis 03/31/2014  . Hx of colonic polyps   . Hyperlipidemia   . MITRAL VALVE PROLAPSE 07/28/2007   Qualifier: Diagnosis of  By: Julien Girt CMA, [name]    . PONV (postoperative nausea and vomiting)   . Supraventricular tachycardia ([place])    AV nodal reentry  s/p RFCA 2001  . Thyroid disease    Past Surgical  History:  Procedure Laterality Date  . ABDOMINAL HYSTERECTOMY     heavy menses, prolonged periods. no cancer. cervix remained.   . ATRIAL FIBRILLATION ABLATION N/A 04/30/2013   Procedure: ATRIAL FIBRILLATION ABLATION;  Surgeon: [name], MD;  Location: [facility] CATH LAB;  Service: Cardiovascular;  Laterality: N/A;PVI and CTI by Dr Rayann Heman   . BREAST BIOPSY Left    benign  . BREAST BIOPSY  2011   benign  . CATARACT EXTRACTION BILATERAL W/ ANTERIOR VITRECTOMY    . COLONOSCOPY    . ESOPHAGOGASTRODUODENOSCOPY    . KNEE ARTHROSCOPY Right 2012  . KNEE SURGERY Right 08/22/2018  . LOOP RECORDER INSERTION N/A 05/24/2017   Procedure: LOOP RECORDER INSERTION;  Surgeon: [name], MD;  Location: [place] CV LAB;  Service: Cardiovascular;  Laterality: N/A;  . OOPHORECTOMY     done for cyst on ovaries.   [name] Kitchen ROTATOR CUFF REPAIR  08/2001   Dr. Gladstone Lighter  . SVT ABLATION N/A 07/20/2017   Procedure: SVT ABLATION;  Surgeon: [name], MD;  Location: [place] CV LAB;  Service: Cardiovascular;  Laterality: N/A;  . TEE WITHOUT CARDIOVERSION N/A 04/29/2013   Procedure: TRANSESOPHAGEAL ECHOCARDIOGRAM (TEE);  Surgeon: Jettie Booze, MD;  Location: [place];  Service: Cardiovascular;  Laterality: N/A;  . TOTAL KNEE ARTHROPLASTY Right 06/10/2019   Procedure: TOTAL KNEE ARTHROPLASTY;  Surgeon: Gaynelle Arabian, MD;  Location: [facility];  Service: Orthopedics;  Laterality: Right;  . UPPER GASTROINTESTINAL ENDOSCOPY     Allergies  Allergen Reactions  . Propafenone Other (See Comments)    BP too low  . Codeine Other (See Comments)    REACTION: nausea/dizziness  . Tramadol Hcl Other (See Comments)    REACTION: nausea--dizzy   No outpatient medications have been marked as taking for the 11/25/19 encounter (Office Visit) with Caren Macadam, MD.   Social History   Tobacco Use  . Smoking status: Former Smoker    Packs/day: 0.50    Years: 3.00    Pack years: 1.50    Types: Cigarettes    Quit  date: 09/19/1966    Years since quitting: 53.2  . Smokeless tobacco: Never Used  Substance Use Topics  . Alcohol use: Yes    Alcohol/week: 3.0 standard drinks    Types: 3 Glasses of wine per week    Comment: wine weekly   Family History  Problem Relation Age of Onset  . Heart failure Mother        died suddenly with this  . Parkinson's disease Father   . Diabetes Brother   . Healthy Daughter      Review of Systems  Constitutional: Negative for activity change, appetite change, chills, fatigue, fever and unexpected weight change.  HENT: Positive for congestion (intermittent) and hearing loss. Negative for ear pain, sinus pressure, sinus pain, sore throat and trouble swallowing.   Eyes: Negative for pain and visual disturbance.  Respiratory: Negative for cough, chest tightness, shortness of breath and wheezing.   Cardiovascular: Negative for chest pain, palpitations and leg swelling.  Gastrointestinal: Negative for abdominal pain, blood in stool, constipation, diarrhea, nausea and vomiting.  Genitourinary: Negative for difficulty urinating and menstrual problem.  Musculoskeletal: Positive for arthralgias (right knee, improving). Negative for back pain.  Skin: Negative for rash.  Neurological: Negative for dizziness, weakness, numbness and headaches.  Hematological: Negative for adenopathy. Does not bruise/bleed easily.  Psychiatric/Behavioral: Negative for sleep disturbance and suicidal ideas. The patient is not nervous/anxious.     CBC:  Lab Results  Component Value Date   WBC 11.6 (H) 06/11/2019   HGB 11.8 (L) 06/11/2019   HGB 13.9 07/12/2017   HCT 35.3 (L) 06/11/2019   HCT 41.9 07/12/2017   MCH 33.5 06/11/2019   MCHC 33.4 06/11/2019   RDW 13.0 06/11/2019   RDW 13.9 07/12/2017   PLT 224 06/11/2019   PLT 263 07/12/2017   CMP: Lab Results  Component Value Date   NA 138 06/11/2019   NA 142 07/12/2017   K 3.8 06/11/2019   CL 104 06/11/2019   CO2 26 06/11/2019    ANIONGAP 8 06/11/2019   GLUCOSE 115 (H) 06/11/2019   BUN 7 (L) 06/11/2019   BUN 13 07/12/2017   CREATININE 0.67 06/11/2019   GFRAA >60 06/11/2019   CALCIUM 8.6 (L) 06/11/2019   PROT 6.5 06/05/2019   BILITOT 0.4 06/05/2019   ALKPHOS 99 06/05/2019   ALT 16 06/05/2019   AST 20 06/05/2019   LIPID: Lab Results  Component Value Date   CHOL 254 (H) 10/12/2017   TRIG 182.0 (H) 10/12/2017   HDL 39.10 10/12/2017   LDLCALC 178 (H) 10/12/2017    Objective:  BP 120/78 (BP Location: Left Arm, Patient Position: Sitting, Cuff Size: Normal)   Pulse 77   Temp (!) 97.2 F (36.2 C) (Temporal)   Ht 5' 1.75" (1.568 m)   Wt 141 lb 12.8 oz (64.3 kg)   SpO2 98%  BMI 26.15 kg/m   Weight: 141 lb 12.8 oz (64.3 kg)   BP Readings from Last 3 Encounters:  11/25/19 120/78  06/11/19 (!) 172/77  06/05/19 (!) 158/67   Wt Readings from Last 3 Encounters:  11/25/19 141 lb 12.8 oz (64.3 kg)  06/10/19 144 lb 9 oz (65.6 kg)  06/05/19 144 lb 9 oz (65.6 kg)    Physical Exam Constitutional:      General: She is not in acute distress.    Appearance: She is well-developed.  HENT:     Head: Normocephalic and atraumatic.     Right Ear: External ear normal. Decreased hearing noted.     Left Ear: External ear normal. Decreased hearing noted.     Ears:     Comments: bilat TM opaque    Mouth/Throat:     Pharynx: No oropharyngeal exudate.  Eyes:     Conjunctiva/sclera: Conjunctivae normal.     Pupils: Pupils are equal, round, and reactive to light.  Neck:     Thyroid: No thyromegaly.  Cardiovascular:     Rate and Rhythm: Normal rate and regular rhythm.     Heart sounds: Normal heart sounds. No murmur. No friction rub. No gallop.   Pulmonary:     Effort: Pulmonary effort is normal.     Breath sounds: Normal breath sounds.  Abdominal:     General: Bowel sounds are normal. There is no distension.     Palpations: Abdomen is soft. There is no mass.     Tenderness: There is no abdominal tenderness.  There is no guarding.     Hernia: No hernia is present.  Musculoskeletal:        General: No tenderness or deformity. Normal range of motion.     Cervical back: Normal range of motion and neck supple.  Lymphadenopathy:     Cervical: No cervical adenopathy.  Skin:    General: Skin is warm and dry.     Findings: No rash.  Neurological:     Mental Status: She is alert and oriented to person, place, and time.     Deep Tendon Reflexes: Reflexes normal.     Reflex Scores:      Tricep reflexes are 2+ on the right side and 2+ on the left side.      Bicep reflexes are 2+ on the right side and 2+ on the left side.      Brachioradialis reflexes are 2+ on the right side and 2+ on the left side.      Patellar reflexes are 2+ on the right side and 2+ on the left side. Psychiatric:        Speech: Speech normal.        Behavior: Behavior normal.        Thought Content: Thought content normal.     Assessment/Plan: There are no preventive care reminders to display for this patient. Health Maintenance reviewed - up to date.  1. Preventative health care Keep up with regular exercise, therapy for the right knee.  Keep up with healthy eating.  2. Hypothyroidism, unspecified type Recheck blood work today. - TSH; Future - TSH  3. Hearing loss: ref to ENT; may be related to chronic eustachian tube dysfunction, but she prefers to avoid medication when possible so will get specialty opinion on best plan of treatment.  4. Myalgia due to statin: patient does not wish to have cholesterol checked because she has not tolerated cholesterol treatment in past. On record review; she had  rash with zocor and then myalgias with pravastatin. She has also not tolerated red yeast rice or niacin.   Return in about 6 months (around 05/27/2020) for Chronic condition visit.  Micheline Rough, MD

## 2019-11-26 ENCOUNTER — Telehealth: Payer: Self-pay | Admitting: *Deleted

## 2019-11-26 DIAGNOSIS — T466X5A Adverse effect of antihyperlipidemic and antiarteriosclerotic drugs, initial encounter: Secondary | ICD-10-CM | POA: Insufficient documentation

## 2019-11-26 DIAGNOSIS — M791 Myalgia, unspecified site: Secondary | ICD-10-CM | POA: Insufficient documentation

## 2019-11-26 NOTE — Patient Instructions (Signed)
There are no preventive care reminders to display for this patient.  Depression screen [facility] 2/9 11/25/2019 10/08/2019 11/20/2018  Decreased Interest 0 0 0  Down, Depressed, Hopeless 0 0 0  PHQ - 2 Score 0 0 0  Altered sleeping - - 0  Tired, decreased energy - - 0  Change in appetite - - 0  Feeling bad or failure about yourself  - - 0  Trouble concentrating - - 0  Moving slowly or fidgety/restless - - 0  Suicidal thoughts - - 0  PHQ-9 Score - - 0

## 2019-11-26 NOTE — Telephone Encounter (Signed)
Recall cancelled in Epic.

## 2019-11-26 NOTE — Telephone Encounter (Signed)
-----   Message from Jerene Bears, MD sent at 11/26/2019  2:27 PM EST ----- Cancel recall based on age I communicated this to PCP JMP ----- Message ----- From: Caren Macadam, MD Sent: 11/26/2019   9:46 AM EST To: Jerene Bears, MD  Amyla asked me about necessity for repeat colonoscopy. Her last was done 11/2012 and there was a tubular adenoma removed; she was instructed to repeat in 5 years. She would prefer not to repeat if possible. If it requires further discussion she is happy to reschedule the appointment that she recently cancelled, but she was hesitant to get procedure unless strongly recommended. I told her I would message you (she saw Dr. Sharlett Iles last but she was expecting you to do her next colonoscopy) and that either I would let her know what you said; or if you feel further discussion warranted someone from your office would call her to reschedule a visit. Thanks! -Andris Flurry

## 2019-11-28 ENCOUNTER — Encounter: Payer: Self-pay | Admitting: Family Medicine

## 2019-12-09 ENCOUNTER — Other Ambulatory Visit: Payer: Self-pay

## 2019-12-09 ENCOUNTER — Encounter (INDEPENDENT_AMBULATORY_CARE_PROVIDER_SITE_OTHER): Payer: Self-pay | Admitting: Otolaryngology

## 2019-12-09 ENCOUNTER — Ambulatory Visit (INDEPENDENT_AMBULATORY_CARE_PROVIDER_SITE_OTHER): Payer: Medicare Other | Admitting: Otolaryngology

## 2019-12-09 VITALS — Temp 98.1°F

## 2019-12-09 DIAGNOSIS — H903 Sensorineural hearing loss, bilateral: Secondary | ICD-10-CM

## 2019-12-09 NOTE — Progress Notes (Signed)
HPI: Marissa Hart is a 85 y.o. female who presents is referred by her PCP for evaluation of hearing loss as well as intermittent itching in her ears.  The itching is more in the lateral portion of the ear canal than deep in the ear canal..  She has noted a gradual decline in hearing especially on the right side over the past few years. She has had no dizziness vertigo or headaches  Past Medical History:  Diagnosis Date  . Acquired renal cyst of right kidney   . Anxiety   . Atrial fibrillation and flutter ([place])    s/p PVI and CTI by Dr Rayann Heman 04/2013  . Blood transfusion   . Cataract   . CHEST WALL PAIN, HX OF 07/28/2007   Qualifier: Diagnosis of  By: Julien Girt CMA, [name]    . Diverticulosis of colon   . DJD (degenerative joint disease) 10/18/2011  . Erysipelas   . Fibromyalgia   . FIBROMYALGIA 07/28/2007   Qualifier: Diagnosis of  By: Julien Girt CMA, [name]    . GERD (gastroesophageal reflux disease)   . Hiatal hernia   . Hot flashes - on low dose HRT with gynecologist, Dr. Nori Riis 03/31/2014  . Hx of colonic polyps   . Hyperlipidemia   . MITRAL VALVE PROLAPSE 07/28/2007   Qualifier: Diagnosis of  By: Julien Girt CMA, [name]    . PONV (postoperative nausea and vomiting)   . Supraventricular tachycardia ([place])    AV nodal reentry  s/p RFCA 2001  . Thyroid disease    Past Surgical History:  Procedure Laterality Date  . ABDOMINAL HYSTERECTOMY     heavy menses, prolonged periods. no cancer. cervix remained.   . ATRIAL FIBRILLATION ABLATION N/A 04/30/2013   Procedure: ATRIAL FIBRILLATION ABLATION;  Surgeon: [name], MD;  Location: [facility] CATH LAB;  Service: Cardiovascular;  Laterality: N/A;PVI and CTI by Dr Rayann Heman   . BREAST BIOPSY Left    benign  . BREAST BIOPSY  2011   benign  . CATARACT EXTRACTION BILATERAL W/ ANTERIOR VITRECTOMY    . COLONOSCOPY    . ESOPHAGOGASTRODUODENOSCOPY    . KNEE ARTHROSCOPY Right 2012  . KNEE SURGERY Right 08/22/2018  . LOOP RECORDER INSERTION N/A 05/24/2017   Procedure: LOOP RECORDER INSERTION;  Surgeon: [name], MD;  Location: [place] CV LAB;  Service: Cardiovascular;  Laterality: N/A;  . OOPHORECTOMY     done for cyst on ovaries.   [name] Kitchen ROTATOR CUFF REPAIR  08/2001   Dr. Gladstone Lighter  . SVT ABLATION N/A 07/20/2017   Procedure: SVT ABLATION;  Surgeon: [name], MD;  Location: [place] CV LAB;  Service: Cardiovascular;  Laterality: N/A;  . TEE WITHOUT CARDIOVERSION N/A 04/29/2013   Procedure: TRANSESOPHAGEAL ECHOCARDIOGRAM (TEE);  Surgeon: Jettie Booze, MD;  Location: [place];  Service: Cardiovascular;  Laterality: N/A;  . TOTAL KNEE ARTHROPLASTY Right 06/10/2019   Procedure: TOTAL KNEE ARTHROPLASTY;  Surgeon: Gaynelle Arabian, MD;  Location: [facility];  Service: Orthopedics;  Laterality: Right;  . UPPER GASTROINTESTINAL ENDOSCOPY     Social History   Socioeconomic History  . Marital status: Widowed    Spouse name: Not on file  . Number of children: 1  . Years of education: Not on file  . Highest education level: Not on file  Occupational History    Employer: RETIRED  Tobacco Use  . Smoking status: Former Smoker    Packs/day: 0.50    Years: 3.00    Pack years: 1.50    Types: Cigarettes  Quit date: 09/19/1966    Years since quitting: 53.2  . Smokeless tobacco: Never Used  Substance and Sexual Activity  . Alcohol use: Yes    Alcohol/week: 3.0 standard drinks    Types: 3 Glasses of wine per week    Comment: wine weekly  . Drug use: No  . Sexual activity: Not on file  Other Topics Concern  . 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 Difficulty of Paying Living Expenses:   Food Insecurity:   . Worried About Charity fundraiser in the Last Year:   . Arboriculturist in the Last Year:   Transportation Needs:   . Film/video editor (Medical):   [name] Kitchen Lack of Transportation (Non-Medical):   Physical Activity:   . Days of Exercise per Week:   . Minutes of Exercise per Session:   Stress:   . Feeling of Stress :   Social Connections:   . Frequency of Communication with Friends and Family:   . Frequency of Social Gatherings with Friends and Family:   . Attends Religious Services:   . Active Member of Clubs or Organizations:   . Attends Archivist Meetings:   [name] Kitchen Marital Status:    Family History  Problem Relation Age of Onset  . Heart failure Mother        died suddenly with this  . Parkinson's disease Father   . Diabetes Brother   . Healthy Daughter    Allergies  Allergen Reactions  . Propafenone Other (See Comments)    BP too low  . Codeine Other (See Comments)    REACTION: nausea/dizziness  . Tramadol Hcl Other (See Comments)    REACTION: nausea--dizzy  . Pravastatin     Muscle aches, pains  . Zocor [Simvastatin] Rash   Prior to Admission medications   Medication Sig Start Date End Date Taking? Authorizing Provider  aspirin 325 MG tablet Take 325 mg by mouth daily as needed (knee piain).     [provider]  Cyanocobalamin (B-12) 5000 MCG SUBL Place 5,000 mcg under the tongue daily.    [provider]  docusate sodium (COLACE) 100 MG capsule Take 100 mg by mouth daily as needed for mild constipation.    [provider]  estradiol (VIVELLE-DOT) 0.1 MG/24HR patch Place 0.25 patches onto the skin 2 (two) times a week. Wed and Sat    [provider]  gabapentin (NEURONTIN) 100 MG capsule Take 1 capsule  (100 mg total) by mouth 3 (three) times daily. Take a 100 mg capsule three times a day for two weeks following surgery.Then take a 100 mg capsule two times a day for two weeks. Then take a 100 mg capsule once a  day for two weeks. Then discontinue the Gabapentin. Patient taking differently: Take 100 mg by mouth at bedtime. As needed 06/11/19   Maurice March, PA-C  Hypromellose (ARTIFICIAL TEARS OP) Apply 1 drop to eye daily as needed (dry eyes).    [provider]  levothyroxine (SYNTHROID) 88 MCG tablet TAKE 88 MCG BY MOUTH 3 DAYS PER WEEK AND TAKE 75 MCG ON ALL OTHER DAYS Patient taking differently: TAKE 88 MCG BY MOUTH 3 DAYS PER WEEK AND TAKE 75 MCG ON ALL OTHER DAYS 01/16/19   Lucretia Kern, DO  psyllium (METAMUCIL) 58.6 % powder Take 1 packet by mouth daily as needed (fiber).     [provider]  SAVAYSA 60 MG TABS tablet Take 1 tablet by mouth once daily 05/31/19   Allred, Jeneen Rinks, MD  verapamil (CALAN-SR) 120 MG CR tablet Take 1 tablet (120 mg total) by mouth daily. 10/21/19   Allred, Jeneen Rinks, MD  VITAMIN D, CHOLECALCIFEROL, PO Take 4,000 Units by mouth daily.    [provider]     Positive ROS: Otherwise negative  All other systems have been reviewed and were otherwise negative with the exception of those mentioned in the HPI and as above.  Physical Exam: Constitutional: Alert, well-appearing, no acute distress Ears: External ears without lesions or tenderness.  She has some mild crusting of the lateral portion of the ear canal.  She had no significant wax buildup.  TMs were clear bilaterally. Nasal: External nose without lesions. Septum midline. Clear nasal passages Oral: Lips and gums without lesions. Tongue and palate mucosa without lesions. Posterior oropharynx clear. Neck: No palpable adenopathy or masses Respiratory: Breathing comfortably  Skin: No facial/neck lesions or rash noted.  Audiogram performed in the office today demonstrated bilateral  sensorineural hearing loss but worse on the right side with much poorer word discrimination on the right side.  SRT's were 65 dB on the right and 45 dB on the left.  Discrimination scores were 32% and 80%.  She had type A tympanograms bilaterally.  Procedures  Assessment: Sensorineural hearing loss which appears more progressive on the right side.  Plan: She would be a candidate for hearing aids and discussed this with her.  She is planning on going to [employer] to get the hearing aids. Also discussed with her concerning progressive sensorineural hearing loss in the right ear could be related to retrocochlear pathology such as an acoustic neuroma and discussed obtaining an MRI scan.  She is really not interested in having an MRI scan performed at this time but would recommend proceeding with this if she develops any headaches or dizzy problems. I also prescribed Diprolene 0.05% cream to use for itching.   Radene Journey, MD   CC:

## 2019-12-10 ENCOUNTER — Encounter: Payer: Self-pay | Admitting: Family Medicine

## 2019-12-16 ENCOUNTER — Encounter (INDEPENDENT_AMBULATORY_CARE_PROVIDER_SITE_OTHER): Payer: Self-pay

## 2019-12-18 ENCOUNTER — Ambulatory Visit (INDEPENDENT_AMBULATORY_CARE_PROVIDER_SITE_OTHER): Payer: Medicare Other | Admitting: *Deleted

## 2019-12-18 DIAGNOSIS — I480 Paroxysmal atrial fibrillation: Secondary | ICD-10-CM | POA: Diagnosis not present

## 2019-12-18 LAB — CUP PACEART REMOTE DEVICE CHECK
Date Time Interrogation Session: 20210331033717
Implantable Pulse Generator Implant Date: 20180905

## 2019-12-18 NOTE — Progress Notes (Signed)
ILR Remote 

## 2020-01-01 DIAGNOSIS — Z124 Encounter for screening for malignant neoplasm of cervix: Secondary | ICD-10-CM | POA: Diagnosis not present

## 2020-01-01 DIAGNOSIS — Z01419 Encounter for gynecological examination (general) (routine) without abnormal findings: Secondary | ICD-10-CM | POA: Diagnosis not present

## 2020-01-18 LAB — CUP PACEART REMOTE DEVICE CHECK
Date Time Interrogation Session: 20210501034057
Implantable Pulse Generator Implant Date: 20180905

## 2020-01-20 ENCOUNTER — Ambulatory Visit (INDEPENDENT_AMBULATORY_CARE_PROVIDER_SITE_OTHER): Payer: Medicare Other | Admitting: *Deleted

## 2020-01-20 DIAGNOSIS — R002 Palpitations: Secondary | ICD-10-CM | POA: Diagnosis not present

## 2020-01-21 NOTE — Progress Notes (Signed)
Carelink Summary Report / Loop Recorder 

## 2020-02-11 ENCOUNTER — Other Ambulatory Visit: Payer: Self-pay | Admitting: Family Medicine

## 2020-02-21 LAB — CUP PACEART REMOTE DEVICE CHECK
Date Time Interrogation Session: 20210603230756
Implantable Pulse Generator Implant Date: 20180905

## 2020-03-20 DIAGNOSIS — Z1231 Encounter for screening mammogram for malignant neoplasm of breast: Secondary | ICD-10-CM | POA: Diagnosis not present

## 2020-03-20 LAB — HM MAMMOGRAPHY

## 2020-03-25 ENCOUNTER — Encounter: Payer: Self-pay | Admitting: Family Medicine

## 2020-03-26 ENCOUNTER — Ambulatory Visit (INDEPENDENT_AMBULATORY_CARE_PROVIDER_SITE_OTHER): Payer: Medicare Other | Admitting: *Deleted

## 2020-03-26 DIAGNOSIS — R002 Palpitations: Secondary | ICD-10-CM

## 2020-03-26 LAB — CUP PACEART REMOTE DEVICE CHECK
Date Time Interrogation Session: 20210707232616
Implantable Pulse Generator Implant Date: 20180905

## 2020-03-30 NOTE — Progress Notes (Signed)
Carelink Summary Report / Loop Recorder 

## 2020-03-31 ENCOUNTER — Encounter: Payer: Self-pay | Admitting: Family Medicine

## 2020-04-27 ENCOUNTER — Telehealth: Payer: Self-pay | Admitting: *Deleted

## 2020-04-27 NOTE — Telephone Encounter (Signed)
Pt. calling on community line, states she had direct exposure to covid positive friend on Wednesday 04/22/20.  Pt has been fully vaccinated  since Feb. Advised per guidelines,, test in 3-5 days or sooner if symptoms occur. Advised to wear mask, social distance when out per most recent guidelines.  Pt verbalizes understanding.

## 2020-04-28 ENCOUNTER — Ambulatory Visit (INDEPENDENT_AMBULATORY_CARE_PROVIDER_SITE_OTHER): Payer: Medicare Other | Admitting: *Deleted

## 2020-04-28 DIAGNOSIS — I480 Paroxysmal atrial fibrillation: Secondary | ICD-10-CM

## 2020-04-28 LAB — CUP PACEART REMOTE DEVICE CHECK
Date Time Interrogation Session: 20210809232631
Implantable Pulse Generator Implant Date: 20180905

## 2020-04-30 DIAGNOSIS — Z03818 Encounter for observation for suspected exposure to other biological agents ruled out: Secondary | ICD-10-CM | POA: Diagnosis not present

## 2020-04-30 DIAGNOSIS — Z20822 Contact with and (suspected) exposure to covid-19: Secondary | ICD-10-CM | POA: Diagnosis not present

## 2020-04-30 NOTE — Progress Notes (Signed)
Carelink Summary Report / Loop Recorder 

## 2020-05-14 ENCOUNTER — Encounter: Payer: Self-pay | Admitting: Nurse Practitioner

## 2020-05-14 ENCOUNTER — Other Ambulatory Visit: Payer: Self-pay

## 2020-05-14 ENCOUNTER — Ambulatory Visit: Payer: Medicare Other | Admitting: Nurse Practitioner

## 2020-05-14 VITALS — BP 142/68 | HR 68 | Ht 62.0 in | Wt 141.0 lb

## 2020-05-14 DIAGNOSIS — I480 Paroxysmal atrial fibrillation: Secondary | ICD-10-CM

## 2020-05-14 DIAGNOSIS — I471 Supraventricular tachycardia: Secondary | ICD-10-CM

## 2020-05-14 MED ORDER — VERAPAMIL HCL ER 120 MG PO TBCR: 120.0000 mg | EXTENDED_RELEASE_TABLET | Freq: Every day | ORAL | 3 refills | Status: DC

## 2020-05-14 NOTE — Patient Instructions (Signed)
Medication Instructions: Your physician recommends that you continue on your current medications as directed. Please refer to the Current Medication list given to you today.  Labwork: None Ordered  Procedures/Testing: None Ordered  Follow-Up: Your physician wants you to follow-up in: 1 YEAR with Dr. Rayann Heman. You will receive a reminder letter in the mail two months in advance. If you don't receive a letter, please call our office to schedule the follow-up appointment.   If you need a refill on your cardiac medications before your next appointment, please call your pharmacy.

## 2020-05-14 NOTE — Progress Notes (Signed)
Electrophysiology Office Note Date: 05/14/2020  ID:  Marissa Hart, DOB 1934-12-31, MRN [id number]  PCP: Caren Macadam, MD Electrophysiologist: Allred  CC: AF follow up  Marissa Hart is a 85 y.o. female seen today for Dr Rayann Heman.  She presents today for routine electrophysiology followup.  Since last being seen in our clinic, the patient reports doing very well.  She denies chest pain, palpitations, dyspnea, PND, orthopnea, nausea, vomiting, dizziness, syncope, edema, weight gain, or early satiety.  Past Medical History:  Diagnosis Date  . Acquired renal cyst of right kidney   . Anxiety   . Atrial fibrillation and flutter ([place])    s/p PVI and CTI by Dr Rayann Heman 04/2013  . Blood transfusion   . Cataract   . CHEST WALL PAIN, HX OF 07/28/2007   Qualifier: Diagnosis of  By: Julien Girt CMA, [name]    . Diverticulosis of colon   . DJD (degenerative joint disease) 10/18/2011  . Erysipelas   . Fibromyalgia   . FIBROMYALGIA 07/28/2007   Qualifier: Diagnosis of  By: Julien Girt CMA, [name]    . GERD (gastroesophageal reflux disease)   . Hiatal hernia   . Hot flashes - on low dose HRT with gynecologist, Dr. Nori Riis 03/31/2014  . Hx of colonic polyps   . Hyperlipidemia   . MITRAL VALVE PROLAPSE 07/28/2007   Qualifier: Diagnosis of  By: Julien Girt CMA, [name]    . PONV (postoperative nausea and vomiting)   . Supraventricular tachycardia ([place])    AV nodal reentry  s/p RFCA 2001  . Thyroid disease    Past Surgical History:  Procedure Laterality Date  . ABDOMINAL HYSTERECTOMY     heavy menses, prolonged periods. no cancer. cervix remained.   . ATRIAL FIBRILLATION ABLATION N/A 04/30/2013   Procedure: ATRIAL FIBRILLATION ABLATION;  Surgeon: [name], MD;  Location[facility] C[facility] LAB;  Service: Cardiovascular;  Laterality: N/A;PVI and CTI by Dr Rayann Heman   . BREAST BIOPSY Left    benign  . BREAST BIOPSY  2011   benign  . CATARACT EXTRACTION BILATERAL W/ ANTERIOR VITRECTOMY    . COLONOSCOPY    .  ESOPHAGOGASTRODUODENOSCOPY    . KNEE ARTHROSCOPY Right 2012  . KNEE SURGERY Right 08/22/2018  . LOOP RECORDER INSERTION N/A 05/24/2017   Procedure: LOOP RECORDER INSERTION;  Surgeon: [name], MD;  Location: [place] CV LAB;  Service: Cardiovascular;  Laterality: N/A;  . OOPHORECTOMY     done for cyst on ovaries.   [name] Kitchen ROTATOR CUFF REPAIR  08/2001   Dr. Gladstone Lighter  . SVT ABLATION N/A 07/20/2017   Procedure: SVT ABLATION;  Surgeon: [name], MD;  Location: [place] CV LAB;  Service: Cardiovascular;  Laterality: N/A;  . TEE WITHOUT CARDIOVERSION N/A 04/29/2013   Procedure: TRANSESOPHAGEAL ECHOCARDIOGRAM (TEE);  Surgeon: Jettie Booze, MD;  Location: [facility];  Service: Cardiovascular;  Laterality: N/A;  . TOTAL KNEE ARTHROPLASTY Right 06/10/2019   Procedure: TOTAL KNEE ARTHROPLASTY;  Surgeon: Gaynelle Arabian, MD;  Location: [facility] ORS;  Service: Orthopedics;  Laterality: Right;  . UPPER GASTROINTESTINAL ENDOSCOPY      Current Outpatient Medications  Medication Sig Dispense Refill  . levothyroxine (EUTHYROX) 88 MCG tablet Take 1 tablet (88 mcg total) by mouth daily before breakfast. 90 tablet 1  . aspirin 325 MG tablet Take 325 mg by mouth daily as needed (knee piain).     . Cyanocobalamin (B-12) 5000 MCG SUBL Place 5,000 mcg under the tongue daily.    [name] Kitchen  docusate sodium (COLACE) 100 MG capsule Take 100 mg by mouth daily as needed for mild constipation.    [name] Kitchen estradiol (VIVELLE-DOT) 0.1 MG/24HR patch Place 0.25 patches onto the skin 2 (two) times a week. Wed and Sat    . gabapentin (NEURONTIN) 100 MG capsule Take 1 capsule (100 mg total) by mouth 3 (three) times daily. Take a 100 mg capsule three times a day for two weeks following surgery.Then take a 100 mg capsule two times a day for two weeks. Then take a 100 mg capsule once a day for two weeks. Then discontinue the Gabapentin. (Patient taking differently: Take 100 mg by mouth at bedtime. As needed) 84 capsule 0  . Hypromellose  (ARTIFICIAL TEARS OP) Apply 1 drop to eye daily as needed (dry eyes).    . psyllium (METAMUCIL) 58.6 % powder Take 1 packet by mouth daily as needed (fiber).     [name] Kitchen SAVAYSA 60 MG TABS tablet Take 1 tablet by mouth once daily 90 tablet 3  . verapamil (CALAN-SR) 120 MG CR tablet Take 1 tablet (120 mg total) by mouth daily. 90 tablet 2  . VITAMIN D, CHOLECALCIFEROL, PO Take 4,000 Units by mouth daily.     No current facility-administered medications for this visit.    Allergies:   Propafenone, Codeine, Tramadol hcl, Pravastatin, and Zocor [simvastatin]   Social History: Social History   Socioeconomic History  . Marital status: Widowed    Spouse name: Not on file  . Number of children: 1  . Years of education: Not on file  . Highest education level: Not on file  Occupational History    Employer: RETIRED  Tobacco Use  . Smoking status: Former Smoker    Packs/day: 0.50    Years: 3.00    Pack years: 1.50    Types: Cigarettes    Quit date: 09/19/1966    Years since quitting: 53.6  . Smokeless tobacco: Never Used  Substance and Sexual Activity  . Alcohol use: Yes    Alcohol/week: 3.0 standard drinks    Types: 3 Glasses of wine per week    Comment: wine weekly  . Drug use: No  . Sexual activity: Not on file  Other Topics Concern  . 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 Difficulty of Paying Living Expenses: Not on file  Food Insecurity:   .  Worried About Charity fundraiser in the Last Year: Not on file  . Ran Out of Food in the Last Year: Not on file  Transportation Needs:   . Lack of Transportation (Medical): Not on file  . Lack of Transportation (Non-Medical): Not on file  Physical Activity:   . Days of Exercise per Week: Not on file  .  Minutes of Exercise per Session: Not on file  Stress:   . Feeling of Stress : Not on file  Social Connections:   . Frequency of Communication with Friends and Family: Not on file  . Frequency of Social Gatherings with Friends and Family: Not on file  . Attends Religious Services: Not on file  . Active Member of Clubs or Organizations: Not on file  . Attends Archivist Meetings: Not on file  . Marital Status: Not on file  Intimate Partner Violence:   . Fear of Current or Ex-Partner: Not on file  . Emotionally Abused: Not on file  . Physically Abused: Not on file  . Sexually Abused: Not on file    Family History: Family History  Problem Relation Age of Onset  . Heart failure Mother        died suddenly with this  . Parkinson's disease Father   . Diabetes Brother   . Healthy Daughter     Review of Systems: All other systems reviewed and are otherwise negative except as noted above.   Physical Exam: VS:  There were no vitals taken for this visit. , BMI There is no height or weight on file to calculate BMI. Wt Readings from Last 3 Encounters:  11/25/19 141 lb 12.8 oz (64.3 kg)  06/10/19 144 lb 9 oz (65.6 kg)  06/05/19 144 lb 9 oz (65.6 kg)    GEN- The patient is elderly appearing, alert and oriented x 3 today.   HEENT: normocephalic, atraumatic; sclera clear, conjunctiva pink; hearing intact; oropharynx clear; neck supple  Lungs- Clear to ausculation bilaterally, normal work of breathing.  No wheezes, rales, rhonchi Heart- Regular rate and rhythm  GI- soft, non-tender, non-distended, bowel sounds present, no hepatosplenomegaly Extremities- no clubbing, cyanosis,  or edema  MS- no significant deformity or atrophy Skin- warm and dry, no rash or lesion  Psych- euthymic mood, full affect Neuro- strength and sensation are intact   EKG:  EKG is not ordered today.  Recent Labs: 06/05/2019: ALT 16 06/11/2019: BUN 7; Creatinine, Ser 0.67; Hemoglobin 11.8; Platelets 224; Potassium 3.8; Sodium 138 11/25/2019: TSH 0.16    Other studies Reviewed: Additional studies/ records that were reviewed today include: Dr Jackalyn Lombard office notes    Assessment and Plan:  1.  SVT Prior EPS with multiple AT circuits ILR with no significant recurrence  2.  Paroxysmal atrial fibrillation Burden by ILR 0% CHADS2VASC is 3. She lost her prescription insurance and has not been taking Sayvasa.  We reviewed today stroke risks as well as guidelines. She is clear in her decision for now to not take [place]. I have advised to decrease ASA to 81mg  daily instead of 325mg .  We discussed REACT-AF study today. She would be interested in learning more if it is an option for her.     Current medicines are reviewed at length with the patient today.   The patient does not have concerns regarding her medicines.  The following changes were made today:  none  Labs/ tests ordered today include: none No orders of the defined types were placed in this encounter.    Disposition:   Follow up with 1 year with Dr Rayann Heman    Signed, Chanetta Marshall, NP 05/14/2020 8:53 AM   [facility] [address] [place] [address] [phone] (office) [phone] (fax)

## 2020-05-15 DIAGNOSIS — Z471 Aftercare following joint replacement surgery: Secondary | ICD-10-CM | POA: Diagnosis not present

## 2020-05-15 DIAGNOSIS — Z96651 Presence of right artificial knee joint: Secondary | ICD-10-CM | POA: Diagnosis not present

## 2020-05-27 ENCOUNTER — Encounter: Payer: Self-pay | Admitting: Family Medicine

## 2020-05-27 ENCOUNTER — Other Ambulatory Visit: Payer: Self-pay

## 2020-05-27 ENCOUNTER — Ambulatory Visit: Payer: Medicare Other | Admitting: Family Medicine

## 2020-05-27 VITALS — BP 132/80 | HR 74 | Temp 98.5°F | Ht 62.0 in | Wt 138.9 lb

## 2020-05-27 DIAGNOSIS — I480 Paroxysmal atrial fibrillation: Secondary | ICD-10-CM

## 2020-05-27 DIAGNOSIS — E039 Hypothyroidism, unspecified: Secondary | ICD-10-CM

## 2020-05-27 DIAGNOSIS — E041 Nontoxic single thyroid nodule: Secondary | ICD-10-CM

## 2020-05-27 DIAGNOSIS — D649 Anemia, unspecified: Secondary | ICD-10-CM | POA: Diagnosis not present

## 2020-05-27 DIAGNOSIS — E559 Vitamin D deficiency, unspecified: Secondary | ICD-10-CM | POA: Diagnosis not present

## 2020-05-27 DIAGNOSIS — Z23 Encounter for immunization: Secondary | ICD-10-CM | POA: Diagnosis not present

## 2020-05-27 DIAGNOSIS — E538 Deficiency of other specified B group vitamins: Secondary | ICD-10-CM | POA: Diagnosis not present

## 2020-05-27 NOTE — Progress Notes (Signed)
Marissa Hart DOB: 1934/09/26 Encounter date: 05/27/2020  This is a 85 y.o. female who presents with   History of present illness: Paroxysmal a fib - follows with cardiology; elected not to take [facility], but taking ASA 81mg . Intolerant to statins, even red yeast rice. Tried niacin - made heart beat faster.   Still feels little spot in neck; sometimes hard to take big pills. Not all the time, but some things when she goes to swallow feel like it is harder for things to go down, esp if she doesn't chew completely. Doesn't feel any other new lumps/bumps in the neck.   Hypothyroid: Has been stable on Synthroid. Has decreased to half pill twice a week.   Mammogram completed 03/2020 -normal . DEXA completed 11/2017 (normal)  Follows with dermatology  Follows with gynecologist regularly (Dr. Nori Riis)   Allergies  Allergen Reactions   Propafenone Other (See Comments)    BP too low   Codeine Other (See Comments)    REACTION: nausea/dizziness   Tramadol Hcl Other (See Comments)    REACTION: nausea--dizzy   Pravastatin     Muscle aches, pains   Zocor [Simvastatin] Rash   No outpatient medications have been marked as taking for the 05/27/20 encounter (Office Visit) with Caren Macadam, MD.    Review of Systems  Constitutional: Negative for chills, fatigue and fever.  Respiratory: Negative for cough, chest tightness, shortness of breath and wheezing.   Cardiovascular: Negative for chest pain, palpitations and leg swelling.    Objective:  BP 132/80 (BP Location: Left Arm, Patient Position: Sitting, Cuff Size: Normal)    Pulse 74    Temp 98.5 F (36.9 C) (Oral)    Ht 5\' 2"  (1.575 m)    Wt 138 lb 14.4 oz (63 kg)    BMI 25.41 kg/m   Weight: 138 lb 14.4 oz (63 kg)   BP Readings from Last 3 Encounters:  05/27/20 132/80  05/14/20 (!) 142/68  11/25/19 120/78   Wt Readings from Last 3 Encounters:  05/27/20 138 lb 14.4 oz (63 kg)  05/14/20 141 lb (64 kg)  11/25/19 141 lb 12.8 oz  (64.3 kg)    Physical Exam Constitutional:      General: She is not in acute distress.    Appearance: She is well-developed.  Neck:     Comments: She has tenderness along anterior cervical chain, but no significant lymphadenopathy is appreciated. No palpable thyroid masses are appreciated on exam.  Cardiovascular:     Rate and Rhythm: Normal rate and regular rhythm.     Heart sounds: Normal heart sounds. No murmur heard.  No friction rub.  Pulmonary:     Effort: Pulmonary effort is normal. No respiratory distress.     Breath sounds: Normal breath sounds. No wheezing or rales.  Musculoskeletal:     Right lower leg: No edema.     Left lower leg: No edema.  Neurological:     Mental Status: She is alert and oriented to person, place, and time.  Psychiatric:        Behavior: Behavior normal.     Assessment/Plan  1. Hypothyroidism, unspecified type Continue on levothyroxine. - TSH; Future  2. Paroxysmal atrial fibrillation (HCC) Rate controlled. Has elected to stay just on asa; pros/cons discussed with cardiology at most recent visit.  3. B12 deficiency Taking oral replacement - Vitamin B12; Future  4. Vitamin D deficiency Taking oral replacement. - VITAMIN D 25 Hydroxy (Vit-D Deficiency, Fractures); Future  5. Thyroid  cyst She has some sensation in throat that worries her. I think it is reasonable to repeat an [place]. If this is stable/normal then I would consider referral to GI for further evaluation. - US THYROID; Future  6. Hypocalcemia - Comprehensive metabolic panel; Future  7. Anemia, unspecified type - CBC with Differential/Platelet; Future - Iron, TIBC and Ferritin Panel; Future  Return for pending labs/imaging.      Micheline Rough, MD

## 2020-05-28 LAB — COMPREHENSIVE METABOLIC PANEL
AG Ratio: 1.9 (calc) (ref 1.0–2.5)
ALT: 12 U/L (ref 6–29)
AST: 16 U/L (ref 10–35)
Albumin: 4.1 g/dL (ref 3.6–5.1)
Alkaline phosphatase (APISO): 104 U/L (ref 37–153)
BUN: 12 mg/dL (ref 7–25)
CO2: 29 mmol/L (ref 20–32)
Calcium: 9.2 mg/dL (ref 8.6–10.4)
Chloride: 101 mmol/L (ref 98–110)
Creat: 0.72 mg/dL (ref 0.60–0.88)
Globulin: 2.2 g/dL (calc) (ref 1.9–3.7)
Glucose, Bld: 128 mg/dL — ABNORMAL HIGH (ref 65–99)
Potassium: 4.2 mmol/L (ref 3.5–5.3)
Sodium: 138 mmol/L (ref 135–146)
Total Bilirubin: 0.5 mg/dL (ref 0.2–1.2)
Total Protein: 6.3 g/dL (ref 6.1–8.1)

## 2020-05-28 LAB — IRON,TIBC AND FERRITIN PANEL
%SAT: 23 % (calc) (ref 16–45)
Ferritin: 32 ng/mL (ref 16–288)
Iron: 85 ug/dL (ref 45–160)
TIBC: 366 mcg/dL (calc) (ref 250–450)

## 2020-05-28 LAB — CBC WITH DIFFERENTIAL/PLATELET
Absolute Monocytes: 549 cells/uL (ref 200–950)
Basophils Absolute: 74 cells/uL (ref 0–200)
Basophils Relative: 0.9 %
Eosinophils Absolute: 254 cells/uL (ref 15–500)
Eosinophils Relative: 3.1 %
HCT: 41.7 % (ref 35.0–45.0)
Hemoglobin: 14.2 g/dL (ref 11.7–15.5)
Lymphs Abs: 1468 cells/uL (ref 850–3900)
MCH: 32.9 pg (ref 27.0–33.0)
MCHC: 34.1 g/dL (ref 32.0–36.0)
MCV: 96.8 fL (ref 80.0–100.0)
MPV: 11.5 fL (ref 7.5–12.5)
Monocytes Relative: 6.7 %
Neutro Abs: 5855 cells/uL (ref 1500–7800)
Neutrophils Relative %: 71.4 %
Platelets: 239 10*3/uL (ref 140–400)
RBC: 4.31 10*6/uL (ref 3.80–5.10)
RDW: 12.4 % (ref 11.0–15.0)
Total Lymphocyte: 17.9 %
WBC: 8.2 10*3/uL (ref 3.8–10.8)

## 2020-05-28 LAB — VITAMIN B12: Vitamin B-12: >2000 pg/mL — ABNORMAL HIGH (ref 200–1100)

## 2020-05-28 LAB — VITAMIN D 25 HYDROXY (VIT D DEFICIENCY, FRACTURES): Vit D, 25-Hydroxy: 78 ng/mL (ref 30–100)

## 2020-05-28 LAB — TSH: TSH: 1.78 mIU/L (ref 0.40–4.50)

## 2020-05-31 ENCOUNTER — Ambulatory Visit: Payer: Medicare Other

## 2020-06-01 LAB — CUP PACEART REMOTE DEVICE CHECK
Date Time Interrogation Session: 20210911233020
Implantable Pulse Generator Implant Date: 20180905

## 2020-06-03 ENCOUNTER — Ambulatory Visit
Admission: RE | Admit: 2020-06-03 | Discharge: 2020-06-03 | Disposition: A | Discharge status: Home / Self Care | Payer: Medicare Other | Source: Ambulatory Visit | Attending: Family Medicine | Admitting: Family Medicine

## 2020-06-03 DIAGNOSIS — E079 Disorder of thyroid, unspecified: Secondary | ICD-10-CM | POA: Diagnosis not present

## 2020-06-03 DIAGNOSIS — E041 Nontoxic single thyroid nodule: Secondary | ICD-10-CM | POA: Diagnosis not present

## 2020-06-10 NOTE — Progress Notes (Signed)
Noted  

## 2020-06-12 ENCOUNTER — Other Ambulatory Visit: Payer: Self-pay | Admitting: Internal Medicine

## 2020-06-16 ENCOUNTER — Telehealth: Payer: Self-pay | Admitting: Internal Medicine

## 2020-06-16 MED ORDER — EDOXABAN TOSYLATE 60 MG PO TABS: 60.0000 mg | ORAL_TABLET | Freq: Every day | ORAL | 11 refills | Status: AC

## 2020-06-16 NOTE — Telephone Encounter (Signed)
*  STAT* If patient is at the pharmacy, call can be transferred to refill team.    1. Which medications need to be refilled? (please list name of each medication and dose if known)  Edoxaban Tosylate 60 MG (savaysa)   2. Which pharmacy/location (including street and city if local pharmacy) is medication to be sent to? [place], [place]  3. Do they need a 30 day or 90 day supply? 30 day Patient states she is able to get it for $100 for a discount that Dr. Rayann Heman was able to get for her.

## 2020-06-16 NOTE — Telephone Encounter (Signed)
Prescription sent to pharmacy requested. 

## 2020-06-16 NOTE — Telephone Encounter (Signed)
Pt calling requesting a refill on savaysa. This medication is no longer on pt's medication list. Per pt email on 06/12/20, Dr. Rayann Heman stated that pt could resume medication savaysa. Please address

## 2020-07-03 ENCOUNTER — Ambulatory Visit (INDEPENDENT_AMBULATORY_CARE_PROVIDER_SITE_OTHER): Payer: Medicare Other

## 2020-07-03 DIAGNOSIS — R002 Palpitations: Secondary | ICD-10-CM | POA: Diagnosis not present

## 2020-07-04 LAB — CUP PACEART REMOTE DEVICE CHECK
Date Time Interrogation Session: 20211014233600
Implantable Pulse Generator Implant Date: 20180905

## 2020-07-08 NOTE — Progress Notes (Signed)
Carelink Summary Report / Loop Recorder 

## 2020-07-13 ENCOUNTER — Encounter: Payer: Self-pay | Admitting: Family Medicine

## 2020-07-22 ENCOUNTER — Other Ambulatory Visit: Payer: Self-pay | Admitting: Internal Medicine

## 2020-08-05 ENCOUNTER — Ambulatory Visit (INDEPENDENT_AMBULATORY_CARE_PROVIDER_SITE_OTHER): Payer: Medicare Other

## 2020-08-05 DIAGNOSIS — R002 Palpitations: Secondary | ICD-10-CM | POA: Diagnosis not present

## 2020-08-05 LAB — CUP PACEART REMOTE DEVICE CHECK
Date Time Interrogation Session: 20211116225123
Implantable Pulse Generator Implant Date: 20180905

## 2020-08-07 NOTE — Progress Notes (Signed)
Carelink Summary Report / Loop Recorder 

## 2020-09-08 LAB — CUP PACEART REMOTE DEVICE CHECK
Date Time Interrogation Session: 20211219225118
Implantable Pulse Generator Implant Date: 20180905

## 2020-09-09 ENCOUNTER — Ambulatory Visit (INDEPENDENT_AMBULATORY_CARE_PROVIDER_SITE_OTHER): Payer: Medicare Other

## 2020-09-09 DIAGNOSIS — R002 Palpitations: Secondary | ICD-10-CM | POA: Diagnosis not present

## 2020-09-16 ENCOUNTER — Other Ambulatory Visit: Payer: Self-pay | Admitting: Family Medicine

## 2020-09-23 NOTE — Progress Notes (Signed)
Carelink Summary Report / Loop Recorder 

## 2020-10-10 LAB — CUP PACEART REMOTE DEVICE CHECK
Date Time Interrogation Session: 20220121230431
Implantable Pulse Generator Implant Date: 20180905

## 2020-10-12 ENCOUNTER — Ambulatory Visit (INDEPENDENT_AMBULATORY_CARE_PROVIDER_SITE_OTHER): Payer: Medicare Other

## 2020-10-12 DIAGNOSIS — R002 Palpitations: Secondary | ICD-10-CM

## 2020-10-20 NOTE — Progress Notes (Signed)
Subjective:   Marissa Hart is a 86 y.o. female who presents for Medicare Annual (Subsequent) preventive examination.  Review of Systems    N/A  Cardiac Risk Factors include: advanced age (>64men, >44 women)     Objective:    Today's Vitals   10/21/20 1322  BP: 120/72  Pulse: 76  Temp: 98.3 F (36.8 C)  TempSrc: Oral  Weight: 135 lb 7 oz (61.4 kg)  Height: 5\' 2"  (1.575 m)   Body mass index is 24.77 kg/m.  Advanced Directives 10/21/2020 06/14/2019 06/10/2019 06/05/2019 09/29/2017 07/20/2017 05/24/2017  Does Patient Have a Medical Advance Directive? Yes Yes Yes Yes Yes Yes Yes  Type of Paramedic of [place];Living will [place];Out of facility DNR (pink MOST or yellow form) [place];Out of facility DNR (pink MOST or yellow form) - - Press photographer;Living will [place];Living will  Does patient want to make changes to medical advance directive? No - Patient declined - No - Patient declined No - Patient declined - - No - Patient declined  Copy of [place] in Chart? Yes - validated most recent copy scanned in chart (See row information) Yes - validated most recent copy scanned in chart (See row information) Yes - validated most recent copy scanned in chart (See row information) - - - No - copy requested  Pre-existing out of facility DNR order (yellow form or pink MOST form) - - - - - - -    Current Medications (verified) Outpatient Encounter Medications as of 10/21/2020  Medication Sig  . Cyanocobalamin (B-12) 5000 MCG SUBL Take 1/3 of dropperful by mouth once a day  . docusate sodium (COLACE) 100 MG capsule Take 100 mg by mouth daily as needed for mild constipation.  [name] Kitchen edoxaban (SAVAYSA) 60 MG TABS tablet Take 60 mg by mouth daily.  [name] Kitchen ELDERBERRY PO Take by mouth.  . estradiol (VIVELLE-DOT) 0.1 MG/24HR patch Place 0.25 patches onto the skin 2 (two) times a week. Wed and  Sat  . EUTHYROX 88 MCG tablet TAKE 1 TABLET BY MOUTH ONCE DAILY BEFORE BREAKFAST.  [name] Kitchen Hypromellose (ARTIFICIAL TEARS OP) Apply 1 drop to eye daily as needed (dry eyes).  . verapamil (CALAN-SR) 120 MG CR tablet Take 1 tablet by mouth once daily  . VITAMIN D, CHOLECALCIFEROL, PO Take 4,000 Units by mouth daily.  . psyllium (METAMUCIL) 58.6 % powder Take 1 packet by mouth daily as needed (fiber).  (Patient not taking: Reported on 10/21/2020)  . [DISCONTINUED] aspirin 325 MG tablet Take 325 mg by mouth daily as needed (knee piain).   . [DISCONTINUED] gabapentin (NEURONTIN) 100 MG capsule Take 1 capsule (100 mg total) by mouth 3 (three) times daily. Take a 100 mg capsule three times a day for two weeks following surgery.Then take a 100 mg capsule two times a day for two weeks. Then take a 100 mg capsule once a day for two weeks. Then discontinue the Gabapentin. (Patient taking differently: Take 100 mg by mouth at bedtime. As needed)   No facility-administered encounter medications on file as of 10/21/2020.    Allergies (verified) Propafenone, Codeine, Tramadol hcl, Pravastatin, and Zocor [simvastatin]   History: Past Medical History:  Diagnosis Date  . Acquired renal cyst of right kidney   . Anxiety   . Atrial fibrillation and flutter ([place])    s/p PVI and CTI by Dr Rayann Heman 04/2013  . Blood transfusion   . Cataract   .  CHEST WALL PAIN, HX OF 07/28/2007   Qualifier: Diagnosis of  By: Julien Girt CMA, [name]    . Diverticulosis of colon   . DJD (degenerative joint disease) 10/18/2011  . Erysipelas   . Fibromyalgia   . FIBROMYALGIA 07/28/2007   Qualifier: Diagnosis of  By: Julien Girt CMA, [name]    . GERD (gastroesophageal reflux disease)   . Hiatal hernia   . Hot flashes - on low dose HRT with gynecologist, Dr. Nori Riis 03/31/2014  . Hx of colonic polyps   . Hyperlipidemia   . MITRAL VALVE PROLAPSE 07/28/2007   Qualifier: Diagnosis of  By: Julien Girt CMA, [name]    . PONV (postoperative nausea and vomiting)   .  Supraventricular tachycardia ([place])    AV nodal reentry  s/p RFCA 2001  . Thyroid disease    Past Surgical History:  Procedure Laterality Date  . ABDOMINAL HYSTERECTOMY     heavy menses, prolonged periods. no cancer. cervix remained.   . ATRIAL FIBRILLATION ABLATION N/A 04/30/2013   Procedure: ATRIAL FIBRILLATION ABLATION;  Surgeon: [name], MD;  Location: [facility] CATH LAB;  Service: Cardiovascular;  Laterality: N/A;PVI and CTI by Dr Rayann Heman   . BREAST BIOPSY Left    benign  . BREAST BIOPSY  2011   benign  . CATARACT EXTRACTION BILATERAL W/ ANTERIOR VITRECTOMY    . COLONOSCOPY    . ESOPHAGOGASTRODUODENOSCOPY    . KNEE ARTHROSCOPY Right 2012  . KNEE SURGERY Right 08/22/2018  . LOOP RECORDER INSERTION N/A 05/24/2017   Procedure: LOOP RECORDER INSERTION;  Surgeon: [name], MD;  Location: [place] [facility] CV LAB;  Service: Cardiovascular;  Laterality: N/A;  . OOPHORECTOMY     done for cyst on ovaries.   [name] Kitchen ROTATOR CUFF REPAIR  08/2001   Dr. Gladstone Lighter  . SVT ABLATION N/A 07/20/2017   Procedure: SVT ABLATION;  Surgeon: [name], MD;  Location: [place] CV LAB;  Service: Cardiovascular;  Laterality: N/A;  . TEE WITHOUT CARDIOVERSION N/A 04/29/2013   Procedure: TRANSESOPHAGEAL ECHOCARDIOGRAM (TEE);  Surgeon: Jettie Booze, MD;  Location: [facility];  Service: Cardiovascular;  Laterality: N/A;  . TOTAL KNEE ARTHROPLASTY Right 06/10/2019   Procedure: TOTAL KNEE ARTHROPLASTY;  Surgeon: Gaynelle Arabian, MD;  Location: [facility];  Service: Orthopedics;  Laterality: Right;  . UPPER GASTROINTESTINAL ENDOSCOPY     Family History  Problem Relation Age of Onset  . Heart failure Mother        died suddenly with this  . Parkinson's disease Father   . Diabetes Brother   . Healthy Daughter    Social History   Socioeconomic History  . Marital status: Widowed    Spouse name: Not on file  . Number of children: 1  . Years of education: Not on file  . Highest education level: Not on file   Occupational History    Employer: RETIRED  Tobacco Use  . Smoking status: Former Smoker    Packs/day: 0.50    Years: 3.00    Pack years: 1.50    Types: Cigarettes    Quit date: 09/19/1966    Years since quitting: 54.1  . Smokeless tobacco: Never Used  Substance and Sexual Activity  . Alcohol use: Yes    Alcohol/week: 3.0 standard drinks    Types: 3 Glasses of wine per week    Comment: wine weekly  . Drug use: No  . Sexual activity: Not on file  Other Topics Concern  . 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 Difficulty of Paying Living Expenses: Not hard at all  Food Insecurity: No Food Insecurity  . Worried About Charity fundraiser in the Last Year: Never true  . Ran Out of Food in the Last Year: Never true  Transportation Needs: No Transportation Needs  . Lack of Transportation (Medical): No  . Lack of Transportation (Non-Medical): No  Physical Activity: Insufficiently Active  . Days of Exercise per Week: 3 days  . Minutes of Exercise per Session: 30 min  Stress: No Stress Concern Present  . Feeling of Stress : Not at all  Social Connections: Moderately Isolated  . Frequency of Communication with Friends and Family: More than three times a week  . Frequency of Social Gatherings with Friends and Family: More than three times a week  . Attends Religious Services: More than 4 times per year  . Active Member of Clubs or  Organizations: No  . Attends Archivist Meetings: Never  . Marital Status: Widowed    Tobacco Counseling Counseling given: Not Answered   Clinical Intake:  Pre-visit preparation completed: Yes  Pain : No/denies pain     Nutritional Risks: None Diabetes: No  How often do you need to have someone help you when you read instructions, pamphlets, or other written materials from your doctor or pharmacy?: 1 - Never What is the last grade level you completed in school?: 12th grade  Diabetic? No   Interpreter Needed?: No  Information entered by :: [place] of Daily Living In your present state of health, do you have any difficulty performing the following activities: 10/21/2020  Hearing? Y  Comment Patient has bilateral hearing aids  Vision? N  Difficulty concentrating or making decisions? N  Walking or climbing stairs? N  Dressing or bathing? N  Doing errands, shopping? N  Preparing Food and eating ? N  Using the Toilet? N  In the past six months, have you accidently leaked urine? N  Do you have problems with loss of bowel control? N  Managing your Medications? N  Managing your Finances? N  Some recent data might be hidden    Patient Care Team: Caren Macadam, MD as PCP - General (Family Medicine) [name], MD as PCP - Cardiology (Cardiology) Maisie Fus, MD as Consulting Physician (Obstetrics and Gynecology) [name], MD as Consulting Physician (Cardiology) Gaynelle Arabian, MD as Consulting Physician (Orthopedic Surgery)  Indicate any recent Medical Services you may have received from other than [facility] providers in the past year (date may be approximate).     Assessment:   This is a routine wellness examination for Marissa Hart.  Hearing/Vision screen  Hearing Screening   125Hz  250Hz  500Hz  1000Hz  2000Hz  3000Hz  4000Hz  6000Hz  8000Hz   Right ear:           Left ear:           Vision Screening Comments: Patient states gets eyes  examined every 2 years   Dietary issues and exercise activities discussed: Current Exercise Habits: Home exercise routine, Type of exercise: strength training/weights (bicycling), Time (Minutes): 30, Frequency (Times/Week): 3, Weekly Exercise (Minutes/Week): 90  Goals    . Patient Stated     Start walking again      . Patient Stated     Continue maintaining your exercise routine and social life!    . Patient Stated     I would like to stay healthy.      Depression Screen PHQ 2/9 Scores 10/21/2020 11/25/2019 10/08/2019 11/20/2018 11/20/2018 11/20/2018 10/02/2018  PHQ - 2 Score 0 0 0 0 0 0 0  PHQ- 9 Score - - - 0 0 - 1    Fall Risk Fall Risk  10/21/2020 11/25/2019 10/08/2019 11/20/2018 10/02/2018  Falls in the past year? 0 0 0 0 0  Number falls in past yr: 0 0 0 0 -  Injury with Fall? 0 0 0 0 -  Risk for fall due to : No Fall Risks - - - -  Follow up Falls evaluation completed;Falls prevention discussed - - - Falls evaluation completed;Education provided    FALL RISK PREVENTION PERTAINING TO THE HOME:  Any stairs in or around the home? No  If so, are there any without handrails? No  Home free of loose throw rugs in walkways, pet beds, electrical cords, etc? Yes  Adequate lighting in your home to reduce risk of falls? Yes   ASSISTIVE DEVICES UTILIZED TO PREVENT FALLS:  Life alert? No  Use of a cane, walker or w/c? No  Grab bars in the bathroom? Yes  Shower chair or bench in shower? Yes  Elevated toilet seat or a handicapped toilet? Yes   TIMED UP AND GO:  Was the test performed? Yes .  Length of time to ambulate 10 feet: 4 sec.   Gait steady and fast without use of assistive device  Cognitive Function: Normal cognitive status assessed by direct observation by this Nurse Health Advisor. No abnormalities found.    MMSE - Mini Mental State Exam 09/29/2017  Not completed: (No Data)        Immunizations Immunization History  Administered Date(s) Administered  . Fluad Quad(high  Dose 65+) 05/22/2019, 05/27/2020  . H1N1 09/09/2008  . Influenza Split 07/02/2011  . Influenza Whole 07/05/2010, 07/18/2012  . Influenza, High Dose Seasonal PF 06/09/2015, 06/09/2016, 05/30/2017, 06/05/2018  . Influenza,inj,Quad PF,6+ Mos 06/17/2013, 06/13/2014  . Influenza,inj,quad, With Preservative 06/19/2017, 06/19/2018  . PFIZER(Purple Top)SARS-COV-2 Vaccination 10/24/2019, 11/18/2019, 07/21/2020  . Pneumococcal Conjugate-13 07/29/2014  . Pneumococcal Polysaccharide-23 07/22/2002  . Tdap 11/30/2011  . Zoster 11/14/2006  . Zoster Recombinat (Shingrix) 01/21/2020, 02/24/2020    TDAP status: Up to date  Flu Vaccine status: Up to date  Pneumococcal vaccine status: Up to date  Covid-19 vaccine status: Completed vaccines  Qualifies for Shingles Vaccine? Yes   Zostavax completed Yes   Shingrix Completed?: Yes  Screening Tests Health Maintenance  Topic Date Due  . MAMMOGRAM  03/20/2021  . TETANUS/TDAP  11/29/2021  . DEXA SCAN  12/15/2022  . INFLUENZA VACCINE  Completed  . COVID-19 Vaccine  Completed    Health Maintenance  There are no preventive care reminders to display for this patient.  Colorectal cancer screening: No longer required.  Mammogram status: Completed 03/20/2020. Repeat every year  Bone Density status: Completed 11/24/2017. Results reflect: Bone density results: OSTEOPENIA. Repeat every 5 years.  Lung Cancer Screening: (Low Dose CT Chest recommended if Age 62-80 years, 30 pack-year currently smoking OR have quit w/in 15years.) does not qualify.   Lung Cancer Screening Referral: N/A   Additional Screening:  Hepatitis C Screening: does not qualify;   Vision Screening: Recommended annual ophthalmology exams for early detection of glaucoma and other disorders of the eye. Is the patient up to date with their annual eye exam?  Yes  Who is the provider or what is the name of the office in which the patient attends annual eye exams? Dr.Groat  If pt is  not established with a provider, would they like to be referred to a provider to establish care? No .   Dental Screening: Recommended annual dental exams for proper oral hygiene  Community Resource Referral / Chronic Care Management: CRR required this visit?  No   CCM required this visit?  No      Plan:     I have personally reviewed and noted the following in the patient's chart:   . Medical and social history . Use of alcohol, tobacco or illicit drugs  . Current medications and supplements . Functional ability and status . Nutritional status . Physical activity . Advanced directives . List of other physicians . Hospitalizations, surgeries, and ER visits in previous 12 months . Vitals . Screenings to include cognitive, depression, and falls . Referrals and appointments  In addition, I have reviewed and discussed with patient certain preventive protocols, quality metrics, and best practice recommendations. A written personalized care plan for preventive services as well as general preventive health recommendations were provided to patient.     Ofilia Neas, LPN   03/22/2594   Nurse Notes: None

## 2020-10-21 ENCOUNTER — Other Ambulatory Visit: Payer: Self-pay

## 2020-10-21 ENCOUNTER — Ambulatory Visit (INDEPENDENT_AMBULATORY_CARE_PROVIDER_SITE_OTHER): Payer: Medicare Other

## 2020-10-21 VITALS — BP 120/72 | HR 76 | Temp 98.3°F | Ht 62.0 in | Wt 135.4 lb

## 2020-10-21 DIAGNOSIS — Z0000 Encounter for general adult medical examination without abnormal findings: Secondary | ICD-10-CM | POA: Diagnosis not present

## 2020-10-21 NOTE — Patient Instructions (Signed)
[name] , Thank you for taking time to come for your Medicare Wellness Visit. I appreciate your ongoing commitment to your health goals. Please review the following plan we discussed and let me know if I can assist you in the future.   Screening recommendations/referrals: Colonoscopy: No longer required  Mammogram: Up to date, next due 03/20/2021 Bone Density: Up to date, next due 11/25/2022 Recommended yearly ophthalmology/optometry visit for glaucoma screening and checkup Recommended yearly dental visit for hygiene and checkup  Vaccinations: Influenza vaccine: Up to date, next due fall 2022 Pneumococcal vaccine: Completed series  Tdap vaccine: Up to date, next due 11/2021 Shingles vaccine: Completed series     Advanced directives: Copies on file   Conditions/risks identified: None   Next appointment: 12/04/2020 @ 9:00 am with Dr. Ethlyn Hart   Preventive Care 65 Years and Older, Female Preventive care refers to lifestyle choices and visits with your health care provider that can promote health and wellness. What does preventive care include?  A yearly physical exam. This is also called an annual well check.  Dental exams once or twice a year.  Routine eye exams. Ask your health care provider how often you should have your eyes checked.  Personal lifestyle choices, including:  Daily care of your teeth and gums.  Regular physical activity.  Eating a healthy diet.  Avoiding tobacco and drug use.  Limiting alcohol use.  Practicing safe sex.  Taking low-dose aspirin every day.  Taking vitamin and mineral supplements as recommended by your health care provider. What happens during an annual well check? The services and screenings done by your health care provider during your annual well check will depend on your age, overall health, lifestyle risk factors, and family history of disease. Counseling  Your health care provider may ask you questions about  your:  Alcohol use.  Tobacco use.  Drug use.  Emotional well-being.  Home and relationship well-being.  Sexual activity.  Eating habits.  History of falls.  Memory and ability to understand (cognition).  Work and work Statistician.  Reproductive health. Screening  You may have the following tests or measurements:  Height, weight, and BMI.  Blood pressure.  Lipid and cholesterol levels. These may be checked every 5 years, or more frequently if you are over 43 years old.  Skin check.  Lung cancer screening. You may have this screening every year starting at age 60 if you have a 30-pack-year history of smoking and currently smoke or have quit within the past 15 years.  Fecal occult blood test (FOBT) of the stool. You may have this test every year starting at age 39.  Flexible sigmoidoscopy or colonoscopy. You may have a sigmoidoscopy every 5 years or a colonoscopy every 10 years starting at age 56.  Hepatitis C blood test.  Hepatitis B blood test.  Sexually transmitted disease (STD) testing.  Diabetes screening. This is done by checking your blood sugar (glucose) after you have not eaten for a while (fasting). You may have this done every 1-3 years.  Bone density scan. This is done to screen for osteoporosis. You may have this done starting at age 63.  Mammogram. This may be done every 1-2 years. Talk to your health care provider about how often you should have regular mammograms. Talk with your health care provider about your test results, treatment options, and if necessary, the need for more tests. Vaccines  Your health care provider may recommend certain vaccines, such as:  Influenza vaccine. This is  recommended every year.  Tetanus, diphtheria, and acellular pertussis (Tdap, Td) vaccine. You may need a Td booster every 10 years.  Zoster vaccine. You may need this after age 29.  Pneumococcal 13-valent conjugate (PCV13) vaccine. One dose is recommended  after age 1.  Pneumococcal polysaccharide (PPSV23) vaccine. One dose is recommended after age 55. Talk to your health care provider about which screenings and vaccines you need and how often you need them. This information is not intended to replace advice given to you by your health care provider. Make sure you discuss any questions you have with your health care provider. Document Released: 10/02/2015 Document Revised: 05/25/2016 Document Reviewed: 07/07/2015 Elsevier Interactive Patient Education  2017 [place] Prevention in the Home Falls can cause injuries. They can happen to people of all ages. There are many things you can do to make your home safe and to help prevent falls. What can I do on the outside of my home?  Regularly fix the edges of walkways and driveways and fix any cracks.  Remove anything that might make you trip as you walk through a door, such as a raised step or threshold.  Trim any bushes or trees on the path to your home.  Use bright outdoor lighting.  Clear any walking paths of anything that might make someone trip, such as rocks or tools.  Regularly check to see if handrails are loose or broken. Make sure that both sides of any steps have handrails.  Any raised decks and porches should have guardrails on the edges.  Have any leaves, snow, or ice cleared regularly.  Use sand or salt on walking paths during winter.  Clean up any spills in your garage right away. This includes oil or grease spills. What can I do in the bathroom?  Use night lights.  Install grab bars by the toilet and in the tub and shower. Do not use towel bars as grab bars.  Use non-skid mats or decals in the tub or shower.  If you need to sit down in the shower, use a plastic, non-slip stool.  Keep the floor dry. Clean up any water that spills on the floor as soon as it happens.  Remove soap buildup in the tub or shower regularly.  Attach bath mats securely with  double-sided non-slip rug tape.  Do not have throw rugs and other things on the floor that can make you trip. What can I do in the bedroom?  Use night lights.  Make sure that you have a light by your bed that is easy to reach.  Do not use any sheets or blankets that are too big for your bed. They should not hang down onto the floor.  Have a firm chair that has side arms. You can use this for support while you get dressed.  Do not have throw rugs and other things on the floor that can make you trip. What can I do in the kitchen?  Clean up any spills right away.  Avoid walking on wet floors.  Keep items that you use a lot in easy-to-reach places.  If you need to reach something above you, use a strong step stool that has a grab bar.  Keep electrical cords out of the way.  Do not use floor polish or wax that makes floors slippery. If you must use wax, use non-skid floor wax.  Do not have throw rugs and other things on the floor that can make you trip.  What can I do with my stairs?  Do not leave any items on the stairs.  Make sure that there are handrails on both sides of the stairs and use them. Fix handrails that are broken or loose. Make sure that handrails are as long as the stairways.  Check any carpeting to make sure that it is firmly attached to the stairs. Fix any carpet that is loose or worn.  Avoid having throw rugs at the top or bottom of the stairs. If you do have throw rugs, attach them to the floor with carpet tape.  Make sure that you have a light switch at the top of the stairs and the bottom of the stairs. If you do not have them, ask someone to add them for you. What else can I do to help prevent falls?  Wear shoes that:  Do not have high heels.  Have rubber bottoms.  Are comfortable and fit you well.  Are closed at the toe. Do not wear sandals.  If you use a stepladder:  Make sure that it is fully opened. Do not climb a closed stepladder.  Make  sure that both sides of the stepladder are locked into place.  Ask someone to hold it for you, if possible.  Clearly mark and make sure that you can see:  Any grab bars or handrails.  First and last steps.  Where the edge of each step is.  Use tools that help you move around (mobility aids) if they are needed. These include:  Canes.  Walkers.  Scooters.  Crutches.  Turn on the lights when you go into a dark area. Replace any light bulbs as soon as they burn out.  Set up your furniture so you have a clear path. Avoid moving your furniture around.  If any of your floors are uneven, fix them.  If there are any pets around you, be aware of where they are.  Review your medicines with your doctor. Some medicines can make you feel dizzy. This can increase your chance of falling. Ask your doctor what other things that you can do to help prevent falls. This information is not intended to replace advice given to you by your health care provider. Make sure you discuss any questions you have with your health care provider. Document Released: 07/02/2009 Document Revised: 02/11/2016 Document Reviewed: 10/10/2014 Elsevier Interactive Patient Education  2017 [employer].

## 2020-10-23 NOTE — Progress Notes (Signed)
Carelink Summary Report / Loop Recorder 

## 2020-11-14 LAB — CUP PACEART REMOTE DEVICE CHECK
Date Time Interrogation Session: 20220223230557
Implantable Pulse Generator Implant Date: 20180905

## 2020-11-16 ENCOUNTER — Ambulatory Visit (INDEPENDENT_AMBULATORY_CARE_PROVIDER_SITE_OTHER): Payer: Medicare Other

## 2020-11-16 DIAGNOSIS — I480 Paroxysmal atrial fibrillation: Secondary | ICD-10-CM | POA: Diagnosis not present

## 2020-11-24 NOTE — Progress Notes (Signed)
Carelink Summary Report / Loop Recorder 

## 2020-12-03 ENCOUNTER — Telehealth: Payer: Self-pay | Admitting: Emergency Medicine

## 2020-12-03 ENCOUNTER — Other Ambulatory Visit: Payer: Self-pay

## 2020-12-03 NOTE — Telephone Encounter (Signed)
Patient notified [facility] at [facility]. Patient does not wish to have device explanted at this time and will call back to schedule explant if she changes her mind. Instructed to unplug her home monitor. Patient unenrolled in [place].

## 2020-12-04 ENCOUNTER — Ambulatory Visit (INDEPENDENT_AMBULATORY_CARE_PROVIDER_SITE_OTHER): Payer: Medicare Other | Admitting: Family Medicine

## 2020-12-04 ENCOUNTER — Other Ambulatory Visit: Payer: Self-pay | Admitting: Family Medicine

## 2020-12-04 ENCOUNTER — Encounter: Payer: Self-pay | Admitting: Family Medicine

## 2020-12-04 VITALS — BP 140/64 | HR 68 | Temp 98.0°F | Ht 62.0 in | Wt 133.4 lb

## 2020-12-04 DIAGNOSIS — M791 Myalgia, unspecified site: Secondary | ICD-10-CM

## 2020-12-04 DIAGNOSIS — E039 Hypothyroidism, unspecified: Secondary | ICD-10-CM

## 2020-12-04 DIAGNOSIS — I480 Paroxysmal atrial fibrillation: Secondary | ICD-10-CM | POA: Diagnosis not present

## 2020-12-04 DIAGNOSIS — T466X5A Adverse effect of antihyperlipidemic and antiarteriosclerotic drugs, initial encounter: Secondary | ICD-10-CM

## 2020-12-04 LAB — COMPREHENSIVE METABOLIC PANEL
ALT: 11 U/L (ref 0–35)
AST: 15 U/L (ref 0–37)
Albumin: 4.3 g/dL (ref 3.5–5.2)
Alkaline Phosphatase: 100 U/L (ref 39–117)
BUN: 16 mg/dL (ref 6–23)
CO2: 28 mEq/L (ref 19–32)
Calcium: 9.1 mg/dL (ref 8.4–10.5)
Chloride: 101 mEq/L (ref 96–112)
Creatinine, Ser: 0.81 mg/dL (ref 0.40–1.20)
GFR: 66.86 mL/min (ref >60.00)
Glucose, Bld: 82 mg/dL (ref 70–99)
Potassium: 4.3 mEq/L (ref 3.5–5.1)
Sodium: 140 mEq/L (ref 135–145)
Total Bilirubin: 0.6 mg/dL (ref 0.2–1.2)
Total Protein: 6.7 g/dL (ref 6.0–8.3)

## 2020-12-04 LAB — TSH: TSH: 5.01 u[IU]/mL — ABNORMAL HIGH (ref 0.35–4.50)

## 2020-12-04 MED ORDER — LEVOTHYROXINE SODIUM 88 MCG PO TABS
88.0000 ug | ORAL_TABLET | Freq: Every day | ORAL | 1 refills | Status: DC
Start: 2020-12-04 — End: 2021-06-25

## 2020-12-04 NOTE — Patient Instructions (Signed)
Could consider biotin supplement to see if that helps with nail strength - up to 10mg  dose would be ok

## 2020-12-04 NOTE — Addendum Note (Signed)
Addended by: Janann Colonel on: 12/04/2020 10:04 AM   Modules accepted: Orders

## 2020-12-04 NOTE — Progress Notes (Signed)
Marissa Hart DOB: Oct 23, 1934 Encounter date: 12/04/2020  This is a 86 y.o. female who presents with Chief Complaint  Patient presents with   Follow-up    History of present illness: Paroxysmal a fib - follows with cardiology; elected not to take [facility], but taking ASA 81mg . Intolerant to statins, even red yeast rice. Tried niacin - made heart beat faster. Didn't take her heart medication this morning - typically at home running in 120's. If she goes up to a fib then will see in 140's; but this has been more rare for her. No chest pain/pressure.   Got new hearing aids last April - still getting used to these.   Still feels little spot in neck; sometimes hard to take big pills. Not all the time, but some things when she goes to swallow feel like it is harder for things to go down, esp if she doesn't chew completely. Doesn't feel any other new lumps/bumps in the neck.   Hypothyroid: Has been stable on Synthroid. Has decreased to half pill twice a week.   Mammogram completed 03/2020 -normal . DEXA completed 11/2017 (normal)  Follows with dermatology - every couple of years  Follows with gynecologist regularly (Dr. Nori Riis) - had DEXA through Dr. Nori Riis. Mammogram through [place]. She does use estradiol patch as well twice weekly and she feels better with this - just mentally feels better overall. Uses just a fourth of a patch twice a week.   Appetite is slightly less for her, but she is happy with weight loss. Energy level is good.   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   For exercise - she still has some trouble with replaced right knee - sometimes numbness; she does walk and bicycle regularly. Sometimes with walking - after certain distance she gets some cramping in back of leg. This just happens with long distance; not with bicycle. Swelling just if out and about and up on feet all day.    Allergies  Allergen Reactions   Propafenone Other  (See Comments)    BP too low   Codeine Other (See Comments)    REACTION: nausea/dizziness   Tramadol Hcl Other (See Comments)    REACTION: nausea--dizzy   Pravastatin     Muscle aches, pains   Zocor [Simvastatin] Rash   Current Meds  Medication Sig   Cyanocobalamin (B-12) 5000 MCG SUBL Take 1/3 of dropperful by mouth once a day   docusate sodium (COLACE) 100 MG capsule Take 100 mg by mouth daily as needed for mild constipation.   ELDERBERRY PO Take by mouth.   estradiol (VIVELLE-DOT) 0.1 MG/24HR patch Place 0.25 patches onto the skin 2 (two) times a week. Wed and Sat   EUTHYROX 88 MCG tablet TAKE 1 TABLET BY MOUTH ONCE DAILY BEFORE BREAKFAST.   Hypromellose (ARTIFICIAL TEARS OP) Apply 1 drop to eye daily as needed (dry eyes).   psyllium (METAMUCIL) 58.6 % powder Take 1 packet by mouth daily as needed (fiber).   verapamil (CALAN-SR) 120 MG CR tablet Take 1 tablet by mouth once daily   VITAMIN D, CHOLECALCIFEROL, PO Take 4,000 Units by mouth daily.    Review of Systems  Constitutional: Negative for chills, fatigue and fever.  Respiratory: Negative for cough, chest tightness, shortness of breath and wheezing.   Cardiovascular: Negative for chest pain, palpitations and leg swelling.  Musculoskeletal: Positive for arthralgias (still with some right knee pain; bothers her more with longer distance  walking).    Objective:  BP (!) 142/72 (BP Location: Left Arm, Patient Position: Sitting, Cuff Size: Normal)    Pulse (!) 43    Temp 98 F (36.7 C) (Oral)    Ht 5\' 2"  (1.575 m)    Wt 133 lb 6.4 oz (60.5 kg)    SpO2 99%    BMI 24.40 kg/m   Weight: 133 lb 6.4 oz (60.5 kg)   BP Readings from Last 3 Encounters:  12/04/20 (!) 142/72  10/21/20 120/72  05/27/20 132/80   Wt Readings from Last 3 Encounters:  12/04/20 133 lb 6.4 oz (60.5 kg)  10/21/20 135 lb 7 oz (61.4 kg)  05/27/20 138 lb 14.4 oz (63 kg)    Physical Exam Constitutional:      General: She is not in acute  distress.    Appearance: She is well-developed.  Cardiovascular:     Rate and Rhythm: Normal rate and regular rhythm.     Heart sounds: Normal heart sounds. No murmur heard. No friction rub.  Pulmonary:     Effort: Pulmonary effort is normal. No respiratory distress.     Breath sounds: Normal breath sounds. No wheezing or rales.  Musculoskeletal:     Right lower leg: No edema.     Left lower leg: No edema.  Neurological:     Mental Status: She is alert and oriented to person, place, and time.  Psychiatric:        Behavior: Behavior normal.     Assessment/Plan  1. Paroxysmal atrial fibrillation (HCC) Rare symptoms. Generally rate controlled at home.  - Comprehensive metabolic panel; Future  2. Hypothyroidism, unspecified type Does half tab weds and Saturday of the 80mcg; other days full tab.  - TSH; Future  3. Myalgia due to statin She prefers not to check lipids. Has not tolerated medications in the past for cholesterol.    Return in about 6 months (around 06/06/2021).    Micheline Rough, MD

## 2020-12-07 ENCOUNTER — Encounter: Payer: Self-pay | Admitting: Family Medicine

## 2020-12-07 NOTE — Telephone Encounter (Signed)
Patient informed of the elevated numbers-see results note.

## 2020-12-21 DIAGNOSIS — M25561 Pain in right knee: Secondary | ICD-10-CM | POA: Diagnosis not present

## 2020-12-22 ENCOUNTER — Encounter: Payer: Self-pay | Admitting: Physical Therapy

## 2020-12-22 ENCOUNTER — Other Ambulatory Visit: Payer: Self-pay

## 2020-12-22 ENCOUNTER — Ambulatory Visit: Payer: Medicare Other | Attending: Orthopedic Surgery | Admitting: Physical Therapy

## 2020-12-22 DIAGNOSIS — M25561 Pain in right knee: Secondary | ICD-10-CM | POA: Diagnosis not present

## 2020-12-22 DIAGNOSIS — M25661 Stiffness of right knee, not elsewhere classified: Secondary | ICD-10-CM | POA: Diagnosis not present

## 2020-12-22 DIAGNOSIS — G8929 Other chronic pain: Secondary | ICD-10-CM | POA: Insufficient documentation

## 2020-12-22 DIAGNOSIS — R202 Paresthesia of skin: Secondary | ICD-10-CM | POA: Insufficient documentation

## 2020-12-22 NOTE — Therapy (Addendum)
[facility] [address], [place], [place], [address] Phone: [phone]   Fax:  [phone]  Physical Therapy Evaluation/Discharge Summary   Patient Details  Name: Marissa Hart MRN: [id number] Date of Birth: 17-Feb-1935 Referring Provider (PT): Fenton Foy PA   Encounter Date: 12/22/2020   PT End of Session - 12/22/20 1920    Visit Number 1    Date for PT Re-Evaluation 02/16/21    Authorization Type BCBS    PT Start Time 1146    PT Stop Time 1230    PT Time Calculation (min) 44 min    Activity Tolerance Patient tolerated treatment well           Past Medical History:  Diagnosis Date  . Acquired renal cyst of right kidney   . Anxiety   . Atrial fibrillation and flutter ([place])    s/p PVI and CTI by Dr Rayann Heman 04/2013  . Blood transfusion   . Cataract   . CHEST WALL PAIN, HX OF 07/28/2007   Qualifier: Diagnosis of  By: Julien Girt CMA, [name]    . Diverticulosis of colon   . DJD (degenerative joint disease) 10/18/2011  . Erysipelas   . Fibromyalgia   . FIBROMYALGIA 07/28/2007   Qualifier: Diagnosis of  By: Julien Girt CMA, [name]    . GERD (gastroesophageal reflux disease)   . Hiatal hernia   . Hot flashes - on low dose HRT with gynecologist, Dr. Nori Riis 03/31/2014  . Hx of colonic polyps   . Hyperlipidemia   . MITRAL VALVE PROLAPSE 07/28/2007   Qualifier: Diagnosis of  By: Julien Girt CMA, [name]    . PONV (postoperative nausea and vomiting)   . Supraventricular tachycardia ([place])    AV nodal reentry  s/p RFCA 2001  . Thyroid disease     Past Surgical History:  Procedure Laterality Date  . ABDOMINAL HYSTERECTOMY     heavy menses, prolonged periods. no cancer. cervix remained.   . ATRIAL FIBRILLATION ABLATION N/A 04/30/2013   Procedure: ATRIAL FIBRILLATION ABLATION;  Surgeon: [name], MD;  Location: [facility] CATH LAB;  Service: Cardiovascular;  Laterality: N/A;PVI and CTI by Dr Rayann Heman   . BREAST BIOPSY Left    benign  .  BREAST BIOPSY  2011   benign  . CATARACT EXTRACTION BILATERAL W/ ANTERIOR VITRECTOMY    . COLONOSCOPY    . ESOPHAGOGASTRODUODENOSCOPY    . KNEE ARTHROSCOPY Right 2012  . KNEE SURGERY Right 08/22/2018  . LOOP RECORDER INSERTION N/A 05/24/2017   Procedure: LOOP RECORDER INSERTION;  Surgeon: [name], MD;  Location: [facility] CV LAB;  Service: Cardiovascular;  Laterality: N/A;  . OOPHORECTOMY     done for cyst on ovaries.   [name] Kitchen ROTATOR CUFF REPAIR  08/2001   Dr. Gladstone Lighter  . SVT ABLATION N/A 07/20/2017   Procedure: SVT ABLATION;  Surgeon: [name], MD;  Location: [facility] CV LAB;  Service: Cardiovascular;  Laterality: N/A;  . TEE WITHOUT CARDIOVERSION N/A 04/29/2013   Procedure: TRANSESOPHAGEAL ECHOCARDIOGRAM (TEE);  Surgeon: Jettie Booze, MD;  Location: [facility];  Service: Cardiovascular;  Laterality: N/A;  . TOTAL KNEE ARTHROPLASTY Right 06/10/2019   Procedure: TOTAL KNEE ARTHROPLASTY;  Surgeon: Gaynelle Arabian, MD;  Location: [facility];  Service: Orthopedics;  Laterality: Right;  . UPPER GASTROINTESTINAL ENDOSCOPY      There were no vitals filed for this visit.    Subjective Assessment - 12/22/20 1143    Subjective Had right TKR 1 1/2 years ago with  continued pain and partial numbness down the front of lower leg constantly.  Worse after I'm active  and at the end of the day.  Takes Tylenol every day since surgery.    Pertinent History Right TKR 1 1/2 years ago; left rotator cuff; used to play tennis    Limitations Walking;House hold activities    How long can you sit comfortably? doesn't hurt    How long can you walk comfortably? 1 1/2 to 2 blocks ( I used to walk further)    Diagnostic tests x-ray looks good;  if not better bone scan in 2 weeks    Patient Stated Goals see if we can take away pain and numbness    Currently in Pain? Yes    Pain Location Knee    Pain Orientation Right    Pain Type Chronic pain    Aggravating Factors  end of the day; with activity;  touch medial/lateral knee    Pain Relieving Factors Tylenol              [facility] PT Assessment - 12/22/20 0001      Assessment   Medical Diagnosis right TKA 1.5 years ago    Referring Provider (PT) Fenton Foy PA    Next MD Visit 2 weeks    Prior Therapy right after TKR for about 5 weeks      Precautions   Precautions None      Restrictions   Weight Bearing Restrictions No      Balance Screen   Has the patient fallen in the past 6 months No    Has the patient had a decrease in activity level because of a fear of falling?  No    Is the patient reluctant to leave their home because of a fear of falling?  No      Home Environment   Living Environment Private residence    Living Arrangements Alone    Type of [place] to enter    Entrance Stairs-Number of Steps 2      Prior Function   Level of Independence Independent    Leisure going to lunch with friends; return to pool when the weather warms up      Observation/Other Assessments   Focus on Therapeutic Outcomes (FOTO)  64%      Sensation   Additional Comments paresthesia right lower leg      AROM   Overall AROM Comments muscle spasms produced in HS with stretching and with prone knee bend    Right Knee Extension -11    Right Knee Flexion 130      Strength   Overall Strength Comments right hip abduction 4+/5; foot 5/;decreased quad control with step down on right    Right Knee Flexion 4+/5    Right Knee Extension 4+/5      Palpation   Palpation comment marked tenderness to light palpation medial femoral condyle region                      Objective measurements completed on examination: See above findings.               PT Education - 12/22/20 1920    Education Details supine and seated neural glide; quad strengthening    Person(s) Educated Patient    Methods Explanation;Demonstration;Handout    Comprehension Returned demonstration;Verbalized  understanding            PT Short  Term Goals - 12/22/20 2010      PT SHORT TERM GOAL #1   Title STG=LTG             PT Long Term Goals - 12/22/20 2010      PT LONG TERM GOAL #1   Title The patient will be independent in a HEP for neural mobilization, knee extension ROM and strengthening    Period Weeks    Status New    Target Date 02/16/21      PT LONG TERM GOAL #2   Title Knee extension ROM to 8 degrees needed for greater ease with walking longer distances    Time 8    Period Weeks    Status New      PT LONG TERM GOAL #3   Title The patient will report a 30% improvement in knee pain and/or lower leg paresthesia    Time 8    Period Weeks    Status New      PT LONG TERM GOAL #4   Title FOTO score improved from 64% to 69%    Time 8    Period Weeks    Status New                  Plan - 12/22/20 1921    Clinical Impression Statement The patient is a pleasant 85 year old who is 1 1/2 years s/p right TKR.  She reports continued anterior/medial and inferior knee pain as well as constant lower leg paresthesia.  She reports the pain is worsened with activity and at the end of the day. She is limited in walking distance 1 1/2 to 2 blocks.   She is markedly tender with light touch to medial and lateral femoral condyles.  She has full knee flexion ROM to 130 degrees however limited knee extension -11 degrees.  Strength is generally good except decreased quad motor control with step downs.  No motor loss in distal LE.  HS muscle cramping with stretching and hip extension MMT.   She may benefit from saphenous nerve gliding, femoral nerve gliding and possible manual therapy to gracilis and sartorius muscles.    Personal Factors and Comorbidities Comorbidity 1;Time since onset of injury/illness/exacerbation    Comorbidities Afib (stable)    Examination-Activity Limitations Stairs;Locomotion Level;Stand    Examination-Participation Restrictions Meal Prep;Cleaning;Community  Activity    Stability/Clinical Decision Making Stable/Uncomplicated    Clinical Decision Making Low    Rehab Potential Good    PT Frequency Biweekly    PT Duration 8 weeks    PT Treatment/Interventions ADLs/Self Care Home Management;Electrical Stimulation;Iontophoresis 32m/ml Dexamethasone;Therapeutic exercise;Dry needling;Therapeutic activities;Patient/family education;Neuromuscular re-education;Moist Heat;Aquatic Therapy;Manual techniques    PT Next Visit Plan saphenous nerve gliding in standing with UE elevation and head extensions;  manual therapy gracilis and sartorius    PT Home Exercise Plan [email or web address]   Consulted and Agree with Plan of Care Patient           Patient will benefit from skilled therapeutic intervention in order to improve the following deficits and impairments:  Difficulty walking,Pain,Impaired sensation,Decreased strength,Decreased range of motion  Visit Diagnosis: Chronic pain of right knee - Plan: PT plan of care cert/re-cert  Paresthesia of skin - Plan: PT plan of care cert/re-cert  Stiffness of right knee, not elsewhere classified - Plan: PT plan of care cert/re-cert   PHYSICAL THERAPY DISCHARGE SUMMARY  Visits from Start of Care: 1  Current functional level related to goals / functional outcomes: The  patient called to cancel follow up appts.  Going for a scan and possibly different route of treatment.   Remaining deficits: As above   Education / Equipment:  Plan: Patient agrees to discharge.  Patient goals were not met. Patient is being discharged due to the patient's request.  ?????        Problem List Patient Active Problem List   Diagnosis Date Noted  . B12 deficiency 05/27/2020  . Myalgia due to statin 11/26/2019  . OA (osteoarthritis) of knee 06/10/2019  . Tachyarrhythmia 02/14/2018  . Left hip pain 03/28/2016  . Paroxysmal atrial fibrillation ([place]) 03/31/2014  . Hot flashes - on low dose HRT with gynecologist, Dr. Nori Riis 03/31/2014   . Hypothyroidism 07/28/2007   Ruben Im, PT 12/22/20 8:16 PM Phone: [phone] Fax: [phone] Alvera Singh 12/22/2020, 8:15 PM  [facility] [address], [place], [place], [address] Phone: [phone]   Fax:  [phone]  Name: Marissa Hart MRN: [id number] Date of Birth: 10-05-1934

## 2020-12-22 NOTE — Patient Instructions (Signed)
Access Code: [email or web address] URL: [email or web address] Date: 12/22/2020 Prepared by: Ruben Im  Exercises Seated Slump Nerve Glide - 1 x daily - 7 x weekly - 1 sets - 10 reps Supine Sciatic Nerve Glide - 1 x daily - 7 x weekly - 1 sets - 10 reps Sit to Stand - 1 x daily - 7 x weekly - 1 sets - 10 reps Seated Long Arc Quad with Ankle Weight - 1 x daily - 7 x weekly - 2-3 sets - 10 reps Forward Step Up with Counter Support - 1 x daily - 7 x weekly - 1 sets - 10 reps

## 2021-01-14 ENCOUNTER — Ambulatory Visit: Payer: Medicare Other | Admitting: Physical Therapy

## 2021-01-18 DIAGNOSIS — M5459 Other low back pain: Secondary | ICD-10-CM | POA: Diagnosis not present

## 2021-01-19 ENCOUNTER — Other Ambulatory Visit (HOSPITAL_COMMUNITY): Payer: Self-pay | Admitting: Orthopedic Surgery

## 2021-01-19 ENCOUNTER — Other Ambulatory Visit: Payer: Self-pay | Admitting: Orthopedic Surgery

## 2021-01-19 DIAGNOSIS — Z96651 Presence of right artificial knee joint: Secondary | ICD-10-CM

## 2021-01-26 ENCOUNTER — Encounter (HOSPITAL_COMMUNITY)
Admission: RE | Admit: 2021-01-26 | Discharge: 2021-01-26 | Disposition: A | Discharge status: Home / Self Care | Payer: Medicare Other | Source: Ambulatory Visit | Attending: Orthopedic Surgery | Admitting: Orthopedic Surgery

## 2021-01-26 ENCOUNTER — Encounter: Payer: Medicare Other | Admitting: Physical Therapy

## 2021-01-26 ENCOUNTER — Other Ambulatory Visit: Payer: Self-pay

## 2021-01-26 DIAGNOSIS — Z96651 Presence of right artificial knee joint: Secondary | ICD-10-CM | POA: Diagnosis not present

## 2021-01-26 DIAGNOSIS — R200 Anesthesia of skin: Secondary | ICD-10-CM | POA: Diagnosis not present

## 2021-01-26 DIAGNOSIS — M7989 Other specified soft tissue disorders: Secondary | ICD-10-CM | POA: Diagnosis not present

## 2021-01-26 MED ORDER — TECHNETIUM TC 99M MEDRONATE IV KIT
20.0000 | PACK | Freq: Once | INTRAVENOUS | Status: AC | PRN
  Administered 2021-01-26: 22 via INTRAVENOUS

## 2021-02-09 ENCOUNTER — Encounter: Payer: Medicare Other | Admitting: Physical Therapy

## 2021-02-11 DIAGNOSIS — M25561 Pain in right knee: Secondary | ICD-10-CM | POA: Diagnosis not present

## 2021-03-11 ENCOUNTER — Other Ambulatory Visit: Payer: Self-pay

## 2021-03-11 ENCOUNTER — Encounter: Payer: Self-pay | Admitting: Internal Medicine

## 2021-03-11 ENCOUNTER — Telehealth (INDEPENDENT_AMBULATORY_CARE_PROVIDER_SITE_OTHER): Payer: Medicare Other | Admitting: Internal Medicine

## 2021-03-11 VITALS — Temp 99.5°F | Wt 134.0 lb

## 2021-03-11 DIAGNOSIS — Z20822 Contact with and (suspected) exposure to covid-19: Secondary | ICD-10-CM

## 2021-03-11 DIAGNOSIS — J069 Acute upper respiratory infection, unspecified: Secondary | ICD-10-CM

## 2021-03-11 NOTE — Progress Notes (Signed)
Virtual Visit via Telephone Note  I connected with Marissa Hart on 03/11/21 at 10:30 AM EDT by telephone and verified that I am speaking with the correct person using two identifiers.   I discussed the limitations, risks, security and privacy concerns of performing an evaluation and management service by telephone and the availability of in person appointments. I also discussed with the patient that there may be a patient responsible charge related to this service. The patient expressed understanding and agreed to proceed.  Location patient: home Location provider: work office Participants present for the call: patient, provider Patient did not have a visit in the prior 7 days to address this/these issue(s).   History of Present Illness:  Since Tuesday, 2 days ago, she has been having a sore throat.  Yesterday had a temperature of 101.6.  She has not had a cough or runny nose.  She took her COVID test was negative.  She has had 3 COVID vaccines.  She feels a little fatigued.   Observations/Objective: Patient sounds cheerful and well on the phone. I do not appreciate any increased work of breathing. Speech and thought processing are grossly intact. Patient reported vitals: None reported   Current Outpatient Medications:    Cyanocobalamin (B-12) 5000 MCG SUBL, Take 1/3 of dropperful by mouth once a day, Disp: , Rfl:    docusate sodium (COLACE) 100 MG capsule, Take 100 mg by mouth daily as needed for mild constipation., Disp: , Rfl:    edoxaban (SAVAYSA) 60 MG TABS tablet, Take 60 mg by mouth daily., Disp: 30 tablet, Rfl: 11   ELDERBERRY PO, Take by mouth., Disp: , Rfl:    estradiol (VIVELLE-DOT) 0.1 MG/24HR patch, Place 0.25 patches onto the skin 2 (two) times a week. Wed and Sat, Disp: , Rfl:    Hypromellose (ARTIFICIAL TEARS OP), Apply 1 drop to eye daily as needed (dry eyes)., Disp: , Rfl:    levothyroxine (EUTHYROX) 88 MCG tablet, Take 1 tablet (88 mcg total) by mouth daily  before breakfast., Disp: 90 tablet, Rfl: 1   psyllium (METAMUCIL) 58.6 % powder, Take 1 packet by mouth daily as needed (fiber)., Disp: , Rfl:    verapamil (CALAN-SR) 120 MG CR tablet, Take 1 tablet by mouth once daily, Disp: 90 tablet, Rfl: 2   VITAMIN D, CHOLECALCIFEROL, PO, Take 4,000 Units by mouth daily., Disp: , Rfl:   Review of Systems:  Constitutional: Positive for fever, chills, diaphoresis, appetite change and fatigue.  HEENT: Denies photophobia, eye pain, redness, hearing loss, ear pain, congestion, rhinorrhea, sneezing, mouth sores, trouble swallowing, neck pain, neck stiffness and tinnitus.   Respiratory: Denies SOB, DOE, cough, chest tightness,  and wheezing.   Cardiovascular: Denies chest pain, palpitations and leg swelling.  Gastrointestinal: Denies nausea, vomiting, abdominal pain, diarrhea, constipation, blood in stool and abdominal distention.  Genitourinary: Denies dysuria, urgency, frequency, hematuria, flank pain and difficulty urinating.  Endocrine: Denies: hot or cold intolerance, sweats, changes in hair or nails, polyuria, polydipsia. Musculoskeletal: Denies myalgias, back pain, joint swelling, arthralgias and gait problem.  Skin: Denies pallor, rash and wound.  Neurological: Denies dizziness, seizures, syncope, weakness, light-headedness, numbness and headaches.  Hematological: Denies adenopathy. Easy bruising, personal or family bleeding history  Psychiatric/Behavioral: Denies suicidal ideation, mood changes, confusion, nervousness, sleep disturbance and agitation   Assessment and Plan:  Viral upper respiratory tract infection Suspected COVID-19 virus infection  -I think COVID infection is still a high possibility as her rapid antigen home COVID test was  done only 24 hours after initiation of symptoms. -She will go get a COVID PCR test and advise [place] of results.  If positive, I would suggest treatment with COVID directed therapy given her age. -In the meantime,  have advised over-the-counter symptomatic management in the form of analgesics, antihistamines, cough lozenges. -If her symptoms fail to improve over the course of the next week, I would suggest an in person evaluation.    I discussed the assessment and treatment plan with the patient. The patient was provided an opportunity to ask questions and all were answered. The patient agreed with the plan and demonstrated an understanding of the instructions.   The patient was advised to call back or seek an in-person evaluation if the symptoms worsen or if the condition fails to improve as anticipated.  I provided 14 minutes of non-face-to-face time during this encounter.   Lelon Frohlich, MD [facility]

## 2021-03-13 ENCOUNTER — Encounter: Payer: Self-pay | Admitting: Internal Medicine

## 2021-03-26 DIAGNOSIS — Z1231 Encounter for screening mammogram for malignant neoplasm of breast: Secondary | ICD-10-CM | POA: Diagnosis not present

## 2021-03-26 LAB — HM MAMMOGRAPHY

## 2021-04-09 ENCOUNTER — Encounter: Payer: Self-pay | Admitting: Family Medicine

## 2021-04-09 DIAGNOSIS — N6002 Solitary cyst of left breast: Secondary | ICD-10-CM | POA: Diagnosis not present

## 2021-04-09 DIAGNOSIS — R928 Other abnormal and inconclusive findings on diagnostic imaging of breast: Secondary | ICD-10-CM | POA: Diagnosis not present

## 2021-04-09 DIAGNOSIS — R922 Inconclusive mammogram: Secondary | ICD-10-CM | POA: Diagnosis not present

## 2021-04-16 ENCOUNTER — Encounter: Payer: Self-pay | Admitting: Family Medicine

## 2021-05-24 ENCOUNTER — Other Ambulatory Visit: Payer: Self-pay | Admitting: Internal Medicine

## 2021-06-07 ENCOUNTER — Ambulatory Visit (INDEPENDENT_AMBULATORY_CARE_PROVIDER_SITE_OTHER): Payer: Medicare Other | Admitting: Family Medicine

## 2021-06-07 ENCOUNTER — Encounter: Payer: Self-pay | Admitting: Internal Medicine

## 2021-06-07 ENCOUNTER — Encounter: Payer: Self-pay | Admitting: Family Medicine

## 2021-06-07 ENCOUNTER — Other Ambulatory Visit: Payer: Self-pay

## 2021-06-07 ENCOUNTER — Ambulatory Visit: Payer: Medicare Other | Admitting: Internal Medicine

## 2021-06-07 VITALS — BP 122/64 | HR 77 | Ht 62.0 in | Wt 134.0 lb

## 2021-06-07 VITALS — BP 134/82 | HR 74 | Temp 98.5°F | Ht 62.0 in | Wt 134.8 lb

## 2021-06-07 DIAGNOSIS — I480 Paroxysmal atrial fibrillation: Secondary | ICD-10-CM

## 2021-06-07 DIAGNOSIS — Z23 Encounter for immunization: Secondary | ICD-10-CM

## 2021-06-07 DIAGNOSIS — E538 Deficiency of other specified B group vitamins: Secondary | ICD-10-CM | POA: Diagnosis not present

## 2021-06-07 DIAGNOSIS — I471 Supraventricular tachycardia: Secondary | ICD-10-CM

## 2021-06-07 DIAGNOSIS — E039 Hypothyroidism, unspecified: Secondary | ICD-10-CM

## 2021-06-07 LAB — CBC WITH DIFFERENTIAL/PLATELET
Basophils Absolute: 0 10*3/uL (ref 0.0–0.1)
Basophils Relative: 0.6 % (ref 0.0–3.0)
Eosinophils Absolute: 0.1 10*3/uL (ref 0.0–0.7)
Eosinophils Relative: 1.4 % (ref 0.0–5.0)
HCT: 40.9 % (ref 36.0–46.0)
Hemoglobin: 14.1 g/dL (ref 12.0–15.0)
Lymphocytes Relative: 17.8 % (ref 12.0–46.0)
Lymphs Abs: 1.3 10*3/uL (ref 0.7–4.0)
MCHC: 34.3 g/dL (ref 30.0–36.0)
MCV: 96.6 fl (ref 78.0–100.0)
Monocytes Absolute: 0.6 10*3/uL (ref 0.1–1.0)
Monocytes Relative: 8.3 % (ref 3.0–12.0)
Neutro Abs: 5.2 10*3/uL (ref 1.4–7.7)
Neutrophils Relative %: 71.9 % (ref 43.0–77.0)
Platelets: 216 10*3/uL (ref 150.0–400.0)
RBC: 4.24 Mil/uL (ref 3.87–5.11)
RDW: 13.7 % (ref 11.5–15.5)
WBC: 7.2 10*3/uL (ref 4.0–10.5)

## 2021-06-07 LAB — COMPREHENSIVE METABOLIC PANEL
ALT: 16 U/L (ref 0–35)
AST: 19 U/L (ref 0–37)
Albumin: 4.1 g/dL (ref 3.5–5.2)
Alkaline Phosphatase: 86 U/L (ref 39–117)
BUN: 11 mg/dL (ref 6–23)
CO2: 30 mEq/L (ref 19–32)
Calcium: 9 mg/dL (ref 8.4–10.5)
Chloride: 100 mEq/L (ref 96–112)
Creatinine, Ser: 0.69 mg/dL (ref 0.40–1.20)
GFR: 79.65 mL/min (ref >60.00)
Glucose, Bld: 82 mg/dL (ref 70–99)
Potassium: 3.9 mEq/L (ref 3.5–5.1)
Sodium: 136 mEq/L (ref 135–145)
Total Bilirubin: 0.4 mg/dL (ref 0.2–1.2)
Total Protein: 6.9 g/dL (ref 6.0–8.3)

## 2021-06-07 LAB — T4, FREE: Free T4: 1.19 ng/dL (ref 0.60–1.60)

## 2021-06-07 LAB — FOLATE: Folate: 5.4 ng/mL — ABNORMAL LOW (ref >5.9)

## 2021-06-07 LAB — TSH: TSH: 0.25 u[IU]/mL — ABNORMAL LOW (ref 0.35–5.50)

## 2021-06-07 NOTE — Progress Notes (Signed)
Marissa Hart DOB: 14-Oct-1934 Encounter date: 06/07/2021  This is a 86 y.o. female who presents with Chief Complaint  Patient presents with   Follow-up    History of present illness:  She is doing well overall.   She did want to get thyroid rechecked; we adjusted medication dose last time to 64mg daily - she feels like energy level and hair and skin are not quite back to normal. Has been taking daily early in the morning. Just fatigues a little easier. Still taking b12.   Occasionally will feel a little flutter, but doesn't last long. Feels that heart is doing well overall. Bp at home when she checks is usually in the 120's/60's and actually drops a little when she has a flutter. Does pretty well overall if she just takes time with position changes. Much better in terms of length of sx; would make her more light headed and fatigued when episodes lasted longer before.    Allergies  Allergen Reactions   Propafenone Other (See Comments)    BP too low   Codeine Other (See Comments)    REACTION: nausea/dizziness   Tramadol Hcl Other (See Comments)    REACTION: nausea--dizzy   Pravastatin     Muscle aches, pains   Zocor [Simvastatin] Rash   Current Meds  Medication Sig   Cyanocobalamin (B-12) 5000 MCG SUBL Take 1/3 of dropperful by mouth once a day   docusate sodium (COLACE) 100 MG capsule Take 100 mg by mouth daily as needed for mild constipation.   edoxaban (SAVAYSA) 60 MG TABS tablet Take 60 mg by mouth daily.   ELDERBERRY PO Take by mouth.   estradiol (VIVELLE-DOT) 0.1 MG/24HR patch Place 0.25 patches onto the skin 2 (two) times a week. Wed and Sat   Hypromellose (ARTIFICIAL TEARS OP) Apply 1 drop to eye daily as needed (dry eyes).   levothyroxine (EUTHYROX) 88 MCG tablet Take 1 tablet (88 mcg total) by mouth daily before breakfast.   psyllium (METAMUCIL) 58.6 % powder Take 1 packet by mouth daily as needed (fiber).   verapamil (CALAN-SR) 120 MG CR tablet Take 1 tablet (120  mg total) by mouth daily. Pt needs to keep upcoming appt in Sept for further refills   VITAMIN D, CHOLECALCIFEROL, PO Take 4,000 Units by mouth daily.    Review of Systems  Constitutional:  Negative for chills, fatigue and fever.  Respiratory:  Negative for cough, chest tightness, shortness of breath and wheezing.   Cardiovascular:  Negative for chest pain, palpitations and leg swelling.   Objective:  BP 134/82 (BP Location: Left Arm, Patient Position: Sitting, Cuff Size: Normal)   Pulse 74   Temp 98.5 F (36.9 C) (Oral)   Ht '5\' 2"'$  (1.575 m)   Wt 134 lb 12.8 oz (61.1 kg)   SpO2 99%   BMI 24.66 kg/m   Weight: 134 lb 12.8 oz (61.1 kg)   BP Readings from Last 3 Encounters:  06/07/21 134/82  12/04/20 140/64  10/21/20 120/72   Wt Readings from Last 3 Encounters:  06/07/21 134 lb 12.8 oz (61.1 kg)  03/11/21 134 lb (60.8 kg)  12/04/20 133 lb 6.4 oz (60.5 kg)    Physical Exam Constitutional:      General: She is not in acute distress.    Appearance: She is well-developed.  Cardiovascular:     Rate and Rhythm: Normal rate and regular rhythm.     Heart sounds: Murmur heard.  Systolic murmur is present with a grade of  2/6.    No friction rub.  Pulmonary:     Effort: Pulmonary effort is normal. No respiratory distress.     Breath sounds: Normal breath sounds. No wheezing or rales.  Musculoskeletal:     Right lower leg: No edema.     Left lower leg: No edema.  Neurological:     Mental Status: She is alert and oriented to person, place, and time.  Psychiatric:        Behavior: Behavior normal.    Assessment/Plan  1. Paroxysmal atrial fibrillation (HCC) Has been rate controlled.  - CBC with Differential/Platelet; Future - Comprehensive metabolic panel; Future  2. Hypothyroidism, unspecified type She feels that she does better in lower normal TSH range; we will recheck labs today and adjust if needed.  - TSH; Future - T4, free; Future  3. B12 deficiency She has cut  supplementation in half from previous.  - Folate; Future  4. Need for Streptococcus pneumoniae vaccination - Pneumococcal conjugate vaccine 20-valent (Prevnar 20)    Return in about 6 months (around 12/05/2021) for physical exam.     Micheline Rough, MD

## 2021-06-07 NOTE — Patient Instructions (Signed)
*  you can get the  4th covid booster shot at local pharmacy *you will be due in 11/2021 for Tdap

## 2021-06-07 NOTE — Progress Notes (Signed)
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 female who presents today for routine electrophysiology followup.  Since last being seen in our clinic, the patient reports doing very well.  Today, she denies symptoms of palpitations, chest pain, shortness of breath,  lower extremity edema, dizziness, presyncope, or syncope.  The patient is otherwise without complaint today.   Past Medical History:  Diagnosis Date   Acquired renal cyst of right kidney    Anxiety    Atrial fibrillation and flutter ([place])    s/p PVI and CTI by Dr Rayann Heman 04/2013   Blood transfusion    Cataract    CHEST WALL PAIN, HX OF 07/28/2007   Qualifier: Diagnosis of  By: Julien Girt CMA, [name]     Diverticulosis of colon    DJD (degenerative joint disease) 10/18/2011   Erysipelas    Fibromyalgia    FIBROMYALGIA 07/28/2007   Qualifier: Diagnosis of  By: Julien Girt CMA, [name]     GERD (gastroesophageal reflux disease)    Hiatal hernia    Hot flashes - on low dose HRT with gynecologist, Dr. Nori Riis 03/31/2014   Hx of colonic polyps    Hyperlipidemia    MITRAL VALVE PROLAPSE 07/28/2007   Qualifier: Diagnosis of  By: Julien Girt CMA, [name]     PONV (postoperative nausea and vomiting)    Supraventricular tachycardia ([place])    AV nodal reentry  s/p RFCA 2001   Thyroid disease    Past Surgical History:  Procedure Laterality Date   ABDOMINAL HYSTERECTOMY     heavy menses, prolonged periods. no cancer. cervix remained.    ATRIAL FIBRILLATION ABLATION N/A 04/30/2013   Procedure: ATRIAL FIBRILLATION ABLATION;  Surgeon: [name], MD;  Location: [facility] CATH LAB;  Service: Cardiovascular;  Laterality: N/A;PVI and CTI by Dr Rayann Heman    BREAST BIOPSY Left    benign   BREAST BIOPSY  2011   benign   CATARACT EXTRACTION BILATERAL W/ ANTERIOR VITRECTOMY     COLONOSCOPY     ESOPHAGOGASTRODUODENOSCOPY     KNEE ARTHROSCOPY Right 2012   KNEE SURGERY Right 08/22/2018   LOOP RECORDER INSERTION N/A 05/24/2017    Procedure: LOOP RECORDER INSERTION;  Surgeon: [name], MD;  Location: [facility] LAB;  Service: Cardiovascular;  Laterality: N/A;   OOPHORECTOMY     done for cyst on ovaries.    ROTATOR CUFF REPAIR  08/2001   Dr. Gladstone Lighter   SVT ABLATION N/A 07/20/2017   Procedure: SVT ABLATION;  Surgeon: [name], MD;  Location: [place] CV LAB;  Service: Cardiovascular;  Laterality: N/A;   TEE WITHOUT CARDIOVERSION N/A 04/29/2013   Procedure: TRANSESOPHAGEAL ECHOCARDIOGRAM (TEE);  Surgeon: Jettie Booze, MD;  Location: [facility];  Service: Cardiovascular;  Laterality: N/A;   TOTAL KNEE ARTHROPLASTY Right 06/10/2019   Procedure: TOTAL KNEE ARTHROPLASTY;  Surgeon: Gaynelle Arabian, MD;  Location: [facility] ORS;  Service: Orthopedics;  Laterality: Right;   UPPER GASTROINTESTINAL ENDOSCOPY      ROS- all systems are reviewed and negatives except as per HPI above  Current Outpatient Medications  Medication Sig Dispense Refill   Cyanocobalamin (B-12) 5000 MCG SUBL Take 1/3 of dropperful by mouth once a day     docusate sodium (COLACE) 100 MG capsule Take 100 mg by mouth daily as needed for mild constipation.     edoxaban (SAVAYSA) 60 MG TABS tablet Take 60 mg by mouth daily. 30 tablet 11   ELDERBERRY PO  Take by mouth.     estradiol (VIVELLE-DOT) 0.1 MG/24HR patch Place 0.25 patches onto the skin 2 (two) times a week. Wed and Sat     Hypromellose (ARTIFICIAL TEARS OP) Apply 1 drop to eye daily as needed (dry eyes).     levothyroxine (EUTHYROX) 88 MCG tablet Take 1 tablet (88 mcg total) by mouth daily before breakfast. 90 tablet 1   psyllium (METAMUCIL) 58.6 % powder Take 1 packet by mouth daily as needed (fiber).     verapamil (CALAN-SR) 120 MG CR tablet Take 1 tablet (120 mg total) by mouth daily. Pt needs to keep upcoming appt in Sept for further refills 30 tablet 0   VITAMIN D, CHOLECALCIFEROL, PO Take 4,000 Units by mouth daily.     No current facility-administered medications for this visit.     Physical Exam: Vitals:   06/07/21 1519  BP: 122/64  Pulse: 77  SpO2: 98%  Weight: 134 lb (60.8 kg)  Height: '5\' 2"'$  (1.575 m)    GEN- The patient is well appearing, alert and oriented x 3 today.   Head- normocephalic, atraumatic Eyes-  Sclera clear, conjunctiva pink Ears- hearing intact Oropharynx- clear Lungs- Clear to ausculation bilaterally, normal work of breathing Heart- Regular rate and rhythm, no murmurs, rubs or gallops, PMI not laterally displaced GI- soft, NT, ND, + BS Extremities- no clubbing, cyanosis, or edema  Wt Readings from Last 3 Encounters:  06/07/21 134 lb (60.8 kg)  06/07/21 134 lb 12.8 oz (61.1 kg)  03/11/21 134 lb (60.8 kg)   Todays labs are reviewed  EKG tracing ordered today is personally reviewed and shows sinus with PVCs  Assessment and Plan:  Multiple atrial tachycardia morphologies Not amenable to ablation Doing well ILR reached RRT in March  She wishes to have this removed.  We discussed risks of ILR removal at length today including risks of bleeding and infection and she wishes to proceed.  2. Paroxysmal atrial fibrillation Well controlled Chads2vasc score is 3.  She is on savaysa  Risks, benefits and potential toxicities for medications prescribed and/or refilled reviewed with patient today.   [name] MD, [facility] 06/07/2021 3:24 PM      PROCEDURES:   1. Implantable loop recorder explantation        DESCRIPTION OF PROCEDURE:  Informed written consent was obtained.  The patient required no sedation for the procedure today.   The patients left chest was therefore prepped and draped in the usual sterile fashion.  The skin overlying the ILR monitor was infiltrated with lidocaine for local analgesia.  A 0.5-cm incision was made over the site.  The previously implanted ILR was exposed and removed using a combination of sharp and blunt dissection.  Steri- Strips and a sterile dressing were then applied. EBL<34m.  There were no early  apparent complications.     CONCLUSIONS:   1. Successful explantation of a [employer] Reveal LINQ implantable loop recorder   2. No early apparent complications.        JThompson GrayerMD, [facility]9/19/2022 3:33 PM

## 2021-06-07 NOTE — Patient Instructions (Addendum)
Medication Instructions:  Your physician recommends that you continue on your current medications as directed. Please refer to the Current Medication list given to you today.  Labwork: None ordered.  Testing/Procedures: None ordered.  Follow-Up:  Your physician wants you to follow-up in: one year with Tommye Standard or Oda Kilts PA.  You will receive a reminder letter in the mail two months in advance. If you don't receive a letter, please call our office to schedule the follow-up appointment.    Implantable Loop Recorder Placement, Care After This sheet gives you information about how to care for yourself after your procedure. Your health care provider may also give you more specific instructions. If you have problems or questions, contact your health care provider. What can I expect after the procedure? After the procedure, it is common to have: Soreness or discomfort near the incision. Some swelling or bruising near the incision.  Follow these instructions at home: Incision care   Leave your outer dressing on for 24 hours.  After 24 hours you can remove your outer dressing and shower. Leave adhesive strips in place. These skin closures may need to stay in place for 1-2 weeks. If adhesive strip edges start to loosen and curl up, you may trim the loose edges.  You may remove the strips if they have not fallen off after 2 weeks. Check your incision area every day for signs of infection. Check for: Redness, swelling, or pain. Fluid or blood. Warmth. Pus or a bad smell. Do not take baths, swim, or use a hot tub until your incision is completely healed. If your wound site starts to bleed apply pressure.      If you have any questions/concerns please call the device clinic at [phone].  Activity  Return to your normal activities.  General instructions Follow instructions from your health care provider about how to manage your implantable loop recorder and transmit the  information. Learn how to activate a recording if this is necessary for your type of device. Do not go through a metal detection gate, and do not let someone hold a metal detector over your chest. Show your ID card. Do not have an MRI unless you check with your health care provider first. Take over-the-counter and prescription medicines only as told by your health care provider. Keep all follow-up visits as told by your health care provider. This is important. Contact a health care provider if: You have redness, swelling, or pain around your incision. You have a fever. You have pain that is not relieved by your pain medicine. You have triggered your device because of fainting (syncope) or because of a heartbeat that feels like it is racing, slow, fluttering, or skipping (palpitations). Get help right away if you have: Chest pain. Difficulty breathing. Summary After the procedure, it is common to have soreness or discomfort near the incision. Change your dressing as told by your health care provider. Follow instructions from your health care provider about how to manage your implantable loop recorder and transmit the information. Keep all follow-up visits as told by your health care provider. This is important. This information is not intended to replace advice given to you by your health care provider. Make sure you discuss any questions you have with your health care provider. Document Released: 08/17/2015 Document Revised: 10/21/2017 Document Reviewed: 10/21/2017 Elsevier Patient Education  2020 [employer].

## 2021-06-10 ENCOUNTER — Encounter: Payer: Self-pay | Admitting: Family Medicine

## 2021-06-10 NOTE — Addendum Note (Signed)
Addended by: Agnes Lawrence on: 06/10/2021 03:04 PM   Modules accepted: Orders

## 2021-06-11 NOTE — Telephone Encounter (Signed)
Please see prior Mychart message.

## 2021-06-11 NOTE — Telephone Encounter (Signed)
Noted  

## 2021-06-14 NOTE — Addendum Note (Signed)
Addended by: Lahoma Crocker A on: 06/14/2021 11:10 AM   Modules accepted: Orders

## 2021-06-25 ENCOUNTER — Other Ambulatory Visit: Payer: Self-pay | Admitting: Family Medicine

## 2021-06-29 ENCOUNTER — Telehealth: Payer: Self-pay

## 2021-06-29 NOTE — Telephone Encounter (Signed)
Patient called and stated that she hasn't received her Rx due to a change in the brand name and it needing approval from PCP to be changed to other brand. Patient would like a call back and stated she is out of medication.  levothyroxine (SYNTHROID) 88 MCG tablet

## 2021-06-29 NOTE — Telephone Encounter (Signed)
Patient called again to follow up on refill and I let her know that Dr.Koberlein is out of the office today and would be back in tomorrow. She asked if there was anyone else that could send prescription in today as she is out of medication and I told her I could send a message back. Patient is asking for a call back to discuss whether prescription will be sent today or not.   Good callback number is [phone]   Please Advise

## 2021-06-30 NOTE — Telephone Encounter (Signed)
Ok to change to what pharmacy has as long as patient is aware!

## 2021-06-30 NOTE — Telephone Encounter (Signed)
Spoke with the pharmacist [facility] and informed her of the message below.  Edie stated she will contact the patient to let her know.

## 2021-07-12 ENCOUNTER — Other Ambulatory Visit: Payer: Self-pay | Admitting: Internal Medicine

## 2021-07-15 DIAGNOSIS — H02834 Dermatochalasis of left upper eyelid: Secondary | ICD-10-CM | POA: Diagnosis not present

## 2021-07-15 DIAGNOSIS — H04123 Dry eye syndrome of bilateral lacrimal glands: Secondary | ICD-10-CM | POA: Diagnosis not present

## 2021-07-15 DIAGNOSIS — H11823 Conjunctivochalasis, bilateral: Secondary | ICD-10-CM | POA: Diagnosis not present

## 2021-07-15 DIAGNOSIS — H02831 Dermatochalasis of right upper eyelid: Secondary | ICD-10-CM | POA: Diagnosis not present

## 2021-07-23 ENCOUNTER — Other Ambulatory Visit: Payer: Medicare Other

## 2021-08-16 ENCOUNTER — Other Ambulatory Visit (INDEPENDENT_AMBULATORY_CARE_PROVIDER_SITE_OTHER): Payer: Medicare Other

## 2021-08-16 DIAGNOSIS — E039 Hypothyroidism, unspecified: Secondary | ICD-10-CM

## 2021-08-16 DIAGNOSIS — E538 Deficiency of other specified B group vitamins: Secondary | ICD-10-CM | POA: Diagnosis not present

## 2021-08-16 LAB — VITAMIN B12: Vitamin B-12: 960 pg/mL — ABNORMAL HIGH (ref 211–911)

## 2021-08-16 LAB — FOLATE: Folate: >23.4 ng/mL (ref >5.9)

## 2021-08-16 LAB — TSH: TSH: 4.21 u[IU]/mL (ref 0.35–5.50)

## 2021-08-19 ENCOUNTER — Encounter: Payer: Self-pay | Admitting: Family Medicine

## 2021-08-19 ENCOUNTER — Other Ambulatory Visit: Payer: Self-pay | Admitting: Family Medicine

## 2021-08-24 ENCOUNTER — Encounter: Payer: Self-pay | Admitting: Family Medicine

## 2021-08-25 ENCOUNTER — Other Ambulatory Visit: Payer: Self-pay | Admitting: Family Medicine

## 2021-08-25 DIAGNOSIS — E039 Hypothyroidism, unspecified: Secondary | ICD-10-CM

## 2021-08-25 MED ORDER — LEVOTHYROXINE SODIUM 75 MCG PO TABS: ORAL_TABLET | ORAL | 3 refills | Status: DC

## 2021-08-25 MED ORDER — LEVOTHYROXINE SODIUM 88 MCG PO TABS: ORAL_TABLET | ORAL | 1 refills | Status: DC

## 2021-09-28 ENCOUNTER — Encounter: Payer: Self-pay | Admitting: Family Medicine

## 2021-09-28 DIAGNOSIS — Z20822 Contact with and (suspected) exposure to covid-19: Secondary | ICD-10-CM | POA: Diagnosis not present

## 2021-09-29 ENCOUNTER — Encounter: Payer: Self-pay | Admitting: Family Medicine

## 2021-10-26 ENCOUNTER — Ambulatory Visit (INDEPENDENT_AMBULATORY_CARE_PROVIDER_SITE_OTHER): Payer: Medicare Other

## 2021-10-26 VITALS — Ht 62.0 in | Wt 134.0 lb

## 2021-10-26 DIAGNOSIS — Z0000 Encounter for general adult medical examination without abnormal findings: Secondary | ICD-10-CM

## 2021-10-26 NOTE — Progress Notes (Signed)
Subjective:   Marissa Hart is a 87 y.o. female who presents for Medicare Annual (Subsequent) preventive examination.  Review of Systems    Virtual Visit via Telephone Note  I connected with  Marissa Hart on 10/26/21 at  9:00 AM EST by telephone and verified that I am speaking with the correct person using two identifiers.  Location: Patient: Home Provider: Office Persons participating in the virtual visit: patient/Nurse Health Advisor   I discussed the limitations, risks, security and privacy concerns of performing an evaluation and management service by telephone and the availability of in person appointments. The patient expressed understanding and agreed to proceed.  Interactive audio and video telecommunications were attempted between this nurse and patient, however failed, due to patient having technical difficulties OR patient did not have access to video capability.  We continued and completed visit with audio only.  Some vital signs may be absent or patient reported.   Criselda Peaches, LPN  Cardiac Risk Factors include: advanced age (>80men, >27 women)     Objective:    Today's Vitals   10/26/21 0914  Weight: 134 lb (60.8 kg)  Height: 5\' 2"  (1.575 m)   Body mass index is 24.51 kg/m.  Advanced Directives 10/26/2021 12/22/2020 10/21/2020 06/14/2019 06/10/2019 06/05/2019 09/29/2017  Does Patient Have a Medical Advance Directive? Yes Yes Yes Yes Yes Yes Yes  Type of Paramedic of [place];Living will Living will;Healthcare Power of [place];Living will [place];Out of facility DNR (pink MOST or yellow form) [place];Out of facility DNR (pink MOST or yellow form) - -  Does patient want to make changes to medical advance directive? No - Patient declined No - Patient declined No - Patient declined - No - Patient declined No - Patient declined -  Copy of [place] in  Chart? Yes - validated most recent copy scanned in chart (See row information) No - copy requested Yes - validated most recent copy scanned in chart (See row information) Yes - validated most recent copy scanned in chart (See row information) Yes - validated most recent copy scanned in chart (See row information) - -  Pre-existing out of facility DNR order (yellow form or pink MOST form) - - - - - - -    Current Medications (verified) Outpatient Encounter Medications as of 10/26/2021  Medication Sig   Cyanocobalamin (B-12) 5000 MCG SUBL Take 1/3 of dropperful by mouth once a day   docusate sodium (COLACE) 100 MG capsule Take 100 mg by mouth daily as needed for mild constipation.   edoxaban (SAVAYSA) 60 MG TABS tablet Take 60 mg by mouth daily.   ELDERBERRY PO Take by mouth.   estradiol (VIVELLE-DOT) 0.1 MG/24HR patch Place 0.25 patches onto the skin 2 (two) times a week. Wed and Sat   Hypromellose (ARTIFICIAL TEARS OP) Apply 1 drop to eye daily as needed (dry eyes).   levothyroxine (SYNTHROID) 75 MCG tablet Take 1 tablet PO daily on Tuesday and Thursday. (On M, W, F, Sat, Sun will take 109mcg)   levothyroxine (SYNTHROID) 88 MCG tablet Take 1 tablet PO Monday, Weds, Friday, Saturday, and Sunday (Taking 38mcg on Tues, thurs)   psyllium (METAMUCIL) 58.6 % powder Take 1 packet by mouth daily as needed (fiber).   verapamil (CALAN-SR) 120 MG CR tablet Take 1 tablet (120 mg total) by mouth daily.   VITAMIN D, CHOLECALCIFEROL, PO Take 4,000 Units by mouth daily.  No facility-administered encounter medications on file as of 10/26/2021.    Allergies (verified) Propafenone, Codeine, Tramadol hcl, Pravastatin, and Zocor [simvastatin]   History: Past Medical History:  Diagnosis Date   Acquired renal cyst of right kidney    Anxiety    Atrial fibrillation and flutter ([place])    s/p PVI and CTI by Dr Rayann Heman 04/2013   Blood transfusion    Cataract    CHEST WALL PAIN, HX OF 07/28/2007   Qualifier: Diagnosis  of  By: Julien Girt CMA, [name]     Diverticulosis of colon    DJD (degenerative joint disease) 10/18/2011   Erysipelas    Fibromyalgia    FIBROMYALGIA 07/28/2007   Qualifier: Diagnosis of  By: Julien Girt CMA, [name]     GERD (gastroesophageal reflux disease)    Hiatal hernia    Hot flashes - on low dose HRT with gynecologist, Dr. Nori Riis 03/31/2014   Hx of colonic polyps    Hyperlipidemia    MITRAL VALVE PROLAPSE 07/28/2007   Qualifier: Diagnosis of  By: Julien Girt CMA, [name]     PONV (postoperative nausea and vomiting)    Supraventricular tachycardia ([place])    AV nodal reentry  s/p RFCA 2001   Thyroid disease    Past Surgical History:  Procedure Laterality Date   ABDOMINAL HYSTERECTOMY     heavy menses, prolonged periods. no cancer. cervix remained.    ATRIAL FIBRILLATION ABLATION N/A 04/30/2013   Procedure: ATRIAL FIBRILLATION ABLATION;  Surgeon: [name], MD;  Location: [facility] CATH LAB;  Service: Cardiovascular;  Laterality: N/A;PVI and CTI by Dr Rayann Heman    BREAST BIOPSY Left    benign   BREAST BIOPSY  2011   benign   CATARACT EXTRACTION BILATERAL W/ ANTERIOR VITRECTOMY     COLONOSCOPY     ESOPHAGOGASTRODUODENOSCOPY     implantable loop recorder removal     KNEE ARTHROSCOPY Right 2012   KNEE SURGERY Right 08/22/2018   LOOP RECORDER INSERTION N/A 05/24/2017   Procedure: LOOP RECORDER INSERTION;  Surgeon: [name], MD;  Location: [facility] CV LAB;  Service: Cardiovascular;  Laterality: N/A;   OOPHORECTOMY     done for cyst on ovaries.    ROTATOR CUFF REPAIR  08/2001   Dr. Gladstone Lighter   SVT ABLATION N/A 07/20/2017   Procedure: SVT ABLATION;  Surgeon: [name], MD;  Location: [facility] CV LAB;  Service: Cardiovascular;  Laterality: N/A;   TEE WITHOUT CARDIOVERSION N/A 04/29/2013   Procedure: TRANSESOPHAGEAL ECHOCARDIOGRAM (TEE);  Surgeon: Jettie Booze, MD;  Location: [facility];  Service: Cardiovascular;  Laterality: N/A;   TOTAL KNEE ARTHROPLASTY Right 06/10/2019    Procedure: TOTAL KNEE ARTHROPLASTY;  Surgeon: Gaynelle Arabian, MD;  Location: [facility] OR[facility];  Service: Orthopedics;  Laterality: Right;   UPPER GASTROINTESTINAL ENDOSCOPY     Family History  Problem Relation Age of Onset   Heart failure Mother        died suddenly with this   Parkinson's disease Father    Diabetes Brother    Healthy Daughter    Social History   Socioeconomic History   Marital status: Widowed    Spouse name: Not on file   Number of children: 1   Years of education: Not on file   Highest education level: Not on file  Occupational History    Employer: RETIRED  Tobacco Use   Smoking status: Former    Packs/day: 0.50    Years: 3.00    Pack years: 1.50    Types: Cigarettes  Quit date: 09/19/1966    Years since quitting: 55.1   Smokeless tobacco: Never  Substance and Sexual Activity   Alcohol use: Yes    Alcohol/week: 3.0 standard drinks    Types: 3 Glasses of wine per week    Comment: wine weekly   Drug use: No   Sexual activity: Not on file  Other Topics Concern   Not on file  Social History Narrative   Work or School: none      Home Situation: lives alone, widowed      Spiritual Beliefs: Christian      Lifestyle: cycles on stationary bike, calisthenics, active with church, family, friends         10/14/17   Spouse died 3 years ago at 67 and dx with liver cancer    Children 1 dtr    2 grands dtr   5 great grands; 5 and 6 and the oldest one 54    Adopted a son from [place]   Children spoke [place] and [place];    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    Social Determinants of Radio broadcast assistant Strain: Low Risk    Difficulty of Paying Living Expenses: Not hard at all  Food Insecurity: No Food Insecurity   Worried About Charity fundraiser in the Last Year: Never true   Arboriculturist in the Last Year: Never true  Transportation Needs: No Transportation Needs   Lack of Transportation (Medical): No   Lack of Transportation  (Non-Medical): No  Physical Activity: Sufficiently Active   Days of Exercise per Week: 7 days   Minutes of Exercise per Session: 30 min  Stress: No Stress Concern Present   Feeling of Stress : Not at all  Social Connections: Moderately Integrated   Frequency of Communication with Friends and Family: More than three times a week   Frequency of Social Gatherings with Friends and Family: More than three times a week   Attends Religious Services: More than 4 times per year   Active Member of [employer] or Organizations: Yes   Attends Archivist Meetings: More than 4 times per year   Marital Status: Widowed    Tobacco Counseling Counseling given: Not Answered   Clinical Intake:  Diabetic? No   Activities of Daily Living In your present state of health, do you have any difficulty performing the following activities: 10/26/2021  Hearing? N  Vision? N  Difficulty concentrating or making decisions? N  Walking or climbing stairs? N  Dressing or bathing? N  Doing errands, shopping? N  Preparing Food and eating ? N  Using the Toilet? N  In the past six months, have you accidently leaked urine? N  Do you have problems with loss of bowel control? N  Managing your Medications? N  Managing your Finances? N  Housekeeping or managing your Housekeeping? N  Some recent data might be hidden    Patient Care Team: Caren Macadam, MD as PCP - General (Family Medicine) [name], MD as PCP - Cardiology (Cardiology) Maisie Fus, MD as Consulting Physician (Obstetrics and Gynecology) [name], MD as Consulting Physician (Cardiology) Gaynelle Arabian, MD as Consulting Physician (Orthopedic Surgery)  Indicate any recent Medical Services you may have received from other than [facility] providers in the past year (date may be approximate).     Assessment:   This is a routine wellness examination for Marissa Hart.  Hearing/Vision screen Hearing Screening - Comments::  Wears hearing  aids Vision Screening - Comments:: Wears reading glasses. Followed Dr Katy Fitch  Dietary issues and exercise activities discussed: Current Exercise Habits: Home exercise routine, Type of exercise: walking, Time (Minutes): 30, Frequency (Times/Week): 7, Weekly Exercise (Minutes/Week): 210, Intensity: Moderate, Exercise limited by: None identified   Goals Addressed             This Visit's Progress    Patient Stated       Start walking again.        Depression Screen PHQ 2/9 Scores 10/26/2021 10/21/2020 11/25/2019 10/08/2019 11/20/2018 11/20/2018 11/20/2018  PHQ - 2 Score 0 0 0 0 0 0 0  PHQ- 9 Score - - - - 0 0 -    Fall Risk Fall Risk  10/26/2021 10/21/2020 11/25/2019 10/08/2019 11/20/2018  Falls in the past year? 0 0 0 0 0  Number falls in past yr: 0 0 0 0 0  Injury with Fall? 0 0 0 0 0  Risk for fall due to : No Fall Risks No Fall Risks - - -  Follow up - Falls evaluation completed;Falls prevention discussed - - -    FALL RISK PREVENTION PERTAINING TO THE HOME:  Any stairs in or around the home? Yes  If so, are there any without handrails? No  Home free of loose throw rugs in walkways, pet beds, electrical cords, etc? Yes  Adequate lighting in your home to reduce risk of falls? Yes   ASSISTIVE DEVICES UTILIZED TO PREVENT FALLS:  Life alert? No  Use of a cane, walker or w/c? No  Grab bars in the bathroom? Yes  Shower chair or bench in shower? Yes  Elevated toilet seat or a handicapped toilet? Yes   TIMED UP AND GO:  Was the test performed? No . Audio Visit  Cognitive Function: MMSE - Mini Mental State Exam 09/29/2017  Not completed: (No Data)      Immunizations Immunization History  Administered Date(s) Administered   Fluad Quad(high Dose 65+) 05/22/2019, 05/27/2020, 06/07/2021   H1N1 09/09/2008   Influenza Split 07/02/2011   Influenza Whole 07/05/2010, 07/18/2012   Influenza, High Dose Seasonal PF 06/09/2015, 06/09/2016, 05/30/2017, 06/05/2018   Influenza,inj,Quad PF,6+ Mos  06/17/2013, 06/13/2014   Influenza,inj,quad, With Preservative 06/19/2017, 06/19/2018   PFIZER(Purple Top)SARS-COV-2 Vaccination 10/24/2019, 11/18/2019, 07/21/2020, 07/09/2021   PNEUMOCOCCAL CONJUGATE-20 06/07/2021   Pneumococcal Conjugate-13 07/29/2014   Pneumococcal Polysaccharide-23 07/22/2002   Tdap 11/30/2011   Zoster Recombinat (Shingrix) 01/21/2020, 02/24/2020   Zoster, Live 11/14/2006    TDAP status: Up to date  Flu Vaccine status: Up to date  Pneumococcal vaccine status: Up to date  Covid-19 vaccine status: Completed vaccines  Qualifies for Shingles Vaccine? Yes   Zostavax completed Yes   Shingrix Completed?: Yes  Screening Tests Health Maintenance  Topic Date Due   COVID-19 Vaccine (5 - Booster for Pfizer series) 09/03/2021   TETANUS/TDAP  11/29/2021   MAMMOGRAM  04/09/2022   DEXA SCAN  12/15/2022   Pneumonia Vaccine 80+ Years old  Completed   INFLUENZA VACCINE  Completed   Zoster Vaccines- Shingrix  Completed   HPV VACCINES  Aged Out    Health Maintenance  Health Maintenance Due  Topic Date Due   COVID-19 Vaccine (5 - Booster for [place] series) 09/03/2021    Colorectal cancer screening: No longer required.   Mammogram status: No longer required due to Age.  Bone Density status: Completed 12/14/17. Results reflect: Bone density results: NORMAL. Repeat every 5 years.  Additional Screening:  Vision Screening: Recommended annual ophthalmology exams for early detection of glaucoma and other disorders of the eye. Is the patient up to date with their annual eye exam?  Yes  Who is the provider or what is the name of the office in which the patient attends annual eye exams? Dr Katy Fitch If pt is not established with a provider, would they like to be referred to a provider to establish care? No .   Dental Screening: Recommended annual dental exams for proper oral hygiene  Community Resource Referral / Chronic Care Management: CRR required this visit?  No    CCM required this visit?  No      Plan:     I have personally reviewed and noted the following in the patients chart:   Medical and social history Use of alcohol, tobacco or illicit drugs  Current medications and supplements including opioid prescriptions.  Functional ability and status Nutritional status Physical activity Advanced directives List of other physicians Hospitalizations, surgeries, and ER visits in previous 12 months Vitals Screenings to include cognitive, depression, and falls Referrals and appointments  In addition, I have reviewed and discussed with patient certain preventive protocols, quality metrics, and best practice recommendations. A written personalized care plan for preventive services as well as general preventive health recommendations were provided to patient.     Criselda Peaches, LPN   [id number]   Nurse Notes: None

## 2021-10-26 NOTE — Patient Instructions (Addendum)
Marissa Hart , Thank you for taking time to come for your Medicare Wellness Visit. I appreciate your ongoing commitment to your health goals. Please review the following plan we discussed and let me know if I can assist you in the future.   These are the goals we discussed:  Goals      Patient Stated     Start walking again.      Patient Stated     Continue maintaining your exercise routine and social life!     Patient Stated     I would like to stay healthy.        This is a list of the screening recommended for you and due dates:  Health Maintenance  Topic Date Due   COVID-19 Vaccine (5 - Booster for Pfizer series) 09/03/2021   Tetanus Vaccine  11/29/2021   Mammogram  04/09/2022   DEXA scan (bone density measurement)  12/15/2022   Pneumonia Vaccine  Completed   Flu Shot  Completed   Zoster (Shingles) Vaccine  Completed   HPV Vaccine  Aged Out     Advanced directives: Yes Documents on file  Conditions/risks identified: None  Next appointment: Follow up in one year for your annual wellness visit    Preventive Care 65 Years and Older, Female Preventive care refers to lifestyle choices and visits with your health care provider that can promote health and wellness. What does preventive care include? A yearly physical exam. This is also called an annual well check. Dental exams once or twice a year. Routine eye exams. Ask your health care provider how often you should have your eyes checked. Personal lifestyle choices, including: Daily care of your teeth and gums. Regular physical activity. Eating a healthy diet. Avoiding tobacco and drug use. Limiting alcohol use. Practicing safe sex. Taking low-dose aspirin every day. Taking vitamin and mineral supplements as recommended by your health care provider. What happens during an annual well check? The services and screenings done by your health care provider during your annual well check will depend on your age,  overall health, lifestyle risk factors, and family history of disease. Counseling  Your health care provider may ask you questions about your: Alcohol use. Tobacco use. Drug use. Emotional well-being. Home and relationship well-being. Sexual activity. Eating habits. History of falls. Memory and ability to understand (cognition). Work and work Statistician. Reproductive health. Screening  You may have the following tests or measurements: Height, weight, and BMI. Blood pressure. Lipid and cholesterol levels. These may be checked every 5 years, or more frequently if you are over 37 years old. Skin check. Lung cancer screening. You may have this screening every year starting at age 51 if you have a 30-pack-year history of smoking and currently smoke or have quit within the past 15 years. Fecal occult blood test (FOBT) of the stool. You may have this test every year starting at age 36. Flexible sigmoidoscopy or colonoscopy. You may have a sigmoidoscopy every 5 years or a colonoscopy every 10 years starting at age 40. Hepatitis C blood test. Hepatitis B blood test. Sexually transmitted disease (STD) testing. Diabetes screening. This is done by checking your blood sugar (glucose) after you have not eaten for a while (fasting). You may have this done every 1-3 years. Bone density scan. This is done to screen for osteoporosis. You may have this done starting at age 63. Mammogram. This may be done every 1-2 years. Talk to your health care provider about how often you  should have regular mammograms. Talk with your health care provider about your test results, treatment options, and if necessary, the need for more tests. Vaccines  Your health care provider may recommend certain vaccines, such as: Influenza vaccine. This is recommended every year. Tetanus, diphtheria, and acellular pertussis (Tdap, Td) vaccine. You may need a Td booster every 10 years. Zoster vaccine. You may need this after age  52. Pneumococcal 13-valent conjugate (PCV13) vaccine. One dose is recommended after age 83. Pneumococcal polysaccharide (PPSV23) vaccine. One dose is recommended after age 25. Talk to your health care provider about which screenings and vaccines you need and how often you need them. This information is not intended to replace advice given to you by your health care provider. Make sure you discuss any questions you have with your health care provider. Document Released: 10/02/2015 Document Revised: 05/25/2016 Document Reviewed: 07/07/2015 Elsevier Interactive Patient Education  2017 [place] Prevention in the Home Falls can cause injuries. They can happen to people of all ages. There are many things you can do to make your home safe and to help prevent falls. What can I do on the outside of my home? Regularly fix the edges of walkways and driveways and fix any cracks. Remove anything that might make you trip as you walk through a door, such as a raised step or threshold. Trim any bushes or trees on the path to your home. Use bright outdoor lighting. Clear any walking paths of anything that might make someone trip, such as rocks or tools. Regularly check to see if handrails are loose or broken. Make sure that both sides of any steps have handrails. Any raised decks and porches should have guardrails on the edges. Have any leaves, snow, or ice cleared regularly. Use sand or salt on walking paths during winter. Clean up any spills in your garage right away. This includes oil or grease spills. What can I do in the bathroom? Use night lights. Install grab bars by the toilet and in the tub and shower. Do not use towel bars as grab bars. Use non-skid mats or decals in the tub or shower. If you need to sit down in the shower, use a plastic, non-slip stool. Keep the floor dry. Clean up any water that spills on the floor as soon as it happens. Remove soap buildup in the tub or shower  regularly. Attach bath mats securely with double-sided non-slip rug tape. Do not have throw rugs and other things on the floor that can make you trip. What can I do in the bedroom? Use night lights. Make sure that you have a light by your bed that is easy to reach. Do not use any sheets or blankets that are too big for your bed. They should not hang down onto the floor. Have a firm chair that has side arms. You can use this for support while you get dressed. Do not have throw rugs and other things on the floor that can make you trip. What can I do in the kitchen? Clean up any spills right away. Avoid walking on wet floors. Keep items that you use a lot in easy-to-reach places. If you need to reach something above you, use a strong step stool that has a grab bar. Keep electrical cords out of the way. Do not use floor polish or wax that makes floors slippery. If you must use wax, use non-skid floor wax. Do not have throw rugs and other things on the  floor that can make you trip. What can I do with my stairs? Do not leave any items on the stairs. Make sure that there are handrails on both sides of the stairs and use them. Fix handrails that are broken or loose. Make sure that handrails are as long as the stairways. Check any carpeting to make sure that it is firmly attached to the stairs. Fix any carpet that is loose or worn. Avoid having throw rugs at the top or bottom of the stairs. If you do have throw rugs, attach them to the floor with carpet tape. Make sure that you have a light switch at the top of the stairs and the bottom of the stairs. If you do not have them, ask someone to add them for you. What else can I do to help prevent falls? Wear shoes that: Do not have high heels. Have rubber bottoms. Are comfortable and fit you well. Are closed at the toe. Do not wear sandals. If you use a stepladder: Make sure that it is fully opened. Do not climb a closed stepladder. Make sure that  both sides of the stepladder are locked into place. Ask someone to hold it for you, if possible. Clearly mark and make sure that you can see: Any grab bars or handrails. First and last steps. Where the edge of each step is. Use tools that help you move around (mobility aids) if they are needed. These include: Canes. Walkers. Scooters. Crutches. Turn on the lights when you go into a dark area. Replace any light bulbs as soon as they burn out. Set up your furniture so you have a clear path. Avoid moving your furniture around. If any of your floors are uneven, fix them. If there are any pets around you, be aware of where they are. Review your medicines with your doctor. Some medicines can make you feel dizzy. This can increase your chance of falling. Ask your doctor what other things that you can do to help prevent falls. This information is not intended to replace advice given to you by your health care provider. Make sure you discuss any questions you have with your health care provider. Document Released: 07/02/2009 Document Revised: 02/11/2016 Document Reviewed: 10/10/2014 Elsevier Interactive Patient Education  2017 [employer].

## 2021-12-06 ENCOUNTER — Ambulatory Visit (INDEPENDENT_AMBULATORY_CARE_PROVIDER_SITE_OTHER): Payer: Medicare Other | Admitting: Family Medicine

## 2021-12-06 ENCOUNTER — Encounter: Payer: Self-pay | Admitting: Family Medicine

## 2021-12-06 VITALS — BP 140/74 | HR 69 | Temp 97.8°F | Ht 62.0 in | Wt 138.2 lb

## 2021-12-06 DIAGNOSIS — I480 Paroxysmal atrial fibrillation: Secondary | ICD-10-CM | POA: Diagnosis not present

## 2021-12-06 DIAGNOSIS — E039 Hypothyroidism, unspecified: Secondary | ICD-10-CM | POA: Diagnosis not present

## 2021-12-06 DIAGNOSIS — R232 Flushing: Secondary | ICD-10-CM

## 2021-12-06 DIAGNOSIS — Z0000 Encounter for general adult medical examination without abnormal findings: Secondary | ICD-10-CM

## 2021-12-06 DIAGNOSIS — E538 Deficiency of other specified B group vitamins: Secondary | ICD-10-CM

## 2021-12-06 DIAGNOSIS — T466X5A Adverse effect of antihyperlipidemic and antiarteriosclerotic drugs, initial encounter: Secondary | ICD-10-CM

## 2021-12-06 DIAGNOSIS — M791 Myalgia, unspecified site: Secondary | ICD-10-CM

## 2021-12-06 LAB — CBC WITH DIFFERENTIAL/PLATELET
Basophils Absolute: 0 10*3/uL (ref 0.0–0.1)
Basophils Relative: 0.6 % (ref 0.0–3.0)
Eosinophils Absolute: 0.1 10*3/uL (ref 0.0–0.7)
Eosinophils Relative: 2.1 % (ref 0.0–5.0)
HCT: 41 % (ref 36.0–46.0)
Hemoglobin: 13.9 g/dL (ref 12.0–15.0)
Lymphocytes Relative: 22.1 % (ref 12.0–46.0)
Lymphs Abs: 1.4 10*3/uL (ref 0.7–4.0)
MCHC: 33.9 g/dL (ref 30.0–36.0)
MCV: 97.6 fl (ref 78.0–100.0)
Monocytes Absolute: 0.7 10*3/uL (ref 0.1–1.0)
Monocytes Relative: 10.1 % (ref 3.0–12.0)
Neutro Abs: 4.2 10*3/uL (ref 1.4–7.7)
Neutrophils Relative %: 65.1 % (ref 43.0–77.0)
Platelets: 198 10*3/uL (ref 150.0–400.0)
RBC: 4.2 Mil/uL (ref 3.87–5.11)
RDW: 14 % (ref 11.5–15.5)
WBC: 6.5 10*3/uL (ref 4.0–10.5)

## 2021-12-06 LAB — FOLATE: Folate: >24.2 ng/mL (ref >5.9)

## 2021-12-06 LAB — COMPREHENSIVE METABOLIC PANEL
ALT: 11 U/L (ref 0–35)
AST: 14 U/L (ref 0–37)
Albumin: 4.3 g/dL (ref 3.5–5.2)
Alkaline Phosphatase: 79 U/L (ref 39–117)
BUN: 10 mg/dL (ref 6–23)
CO2: 30 mEq/L (ref 19–32)
Calcium: 8.9 mg/dL (ref 8.4–10.5)
Chloride: 98 mEq/L (ref 96–112)
Creatinine, Ser: 0.78 mg/dL (ref 0.40–1.20)
GFR: 69.47 mL/min (ref >60.00)
Glucose, Bld: 82 mg/dL (ref 70–99)
Potassium: 4 mEq/L (ref 3.5–5.1)
Sodium: 135 mEq/L (ref 135–145)
Total Bilirubin: 0.6 mg/dL (ref 0.2–1.2)
Total Protein: 6.6 g/dL (ref 6.0–8.3)

## 2021-12-06 LAB — VITAMIN B12: Vitamin B-12: 773 pg/mL (ref 211–911)

## 2021-12-06 LAB — TSH: TSH: 2.22 u[IU]/mL (ref 0.35–5.50)

## 2021-12-06 NOTE — Progress Notes (Signed)
Marissa Hart ?DOB: [birth year] ?Encounter date: 12/06/2021 ? ?This is a 87 y.o. female who presents for complete physical  ? ?History of present illness/Additional concerns: ?Last visit was 6 months ago for chronic condition. ? ?Even with hearing aid, not hearing as clearly as she would like. Got these about 2 years ago.  ? ?Paroxysmal a fib - follows with cardiology; elected not to take [facility], but taking ASA '81mg'$ . Intolerant to statins, even red yeast rice. Tried niacin - made heart beat faster. actus up every now and then. Doesn't usually last too long when it speeds up. If sits, rests then it feels better. She will have to get new cardiologist as well, but has appointment set up in August. A week or so ago was getting spells in the evening around 7-8 oclock. She changed verapamil to evening and feels that this helped. Can come just at rest. No chest pain, pressure, no light headedness.  ? ?Hypothyroid: Has been stable on Synthroid. Taking 37mg on Tuesday, thurs, and 810m other days. Feels better with TSH 1-2. If higher she is tired, if lower then.  ?  ?Mammogram completed 03/2020 -normal . DEXA completed 11/2017 (normal).  No further colonoscopies needed due to age.  (Patient preference and she is low risk) ?  ?Follows with dermatology - every couple of years ?  ?Follows with gynecologist regularly (Dr. NeNori Riis- had DEXA through [place]. [place] estradiol patch as well twice weekly and she feels better with this - just mentally feels better overall. Uses just a fourth of a patch twice a week. She has tried to come off of this, but didn't sleep as well, had hot flashes, mood not as good. ? ?DEXA: normal in 2019 ? ? ?Past Medical History:  ?Diagnosis Date  ? Acquired renal cyst of right kidney   ? Anxiety   ? Atrial fibrillation and flutter ([facility]  ? s/p PVI and CTI by Dr AlRayann Heman/2014  ? Blood transfusion   ? Cataract   ? CHEST WALL PAIN, HX OF 07/28/2007  ? Qualifier: Diagnosis of  By:  AdJulien GirtMA, [name]    ? Diverticulosis of colon   ? DJD (degenerative joint disease) 10/18/2011  ? Erysipelas   ? Fibromyalgia   ? FIBROMYALGIA 07/28/2007  ? Qualifier: Diagnosis of  By: AdJulien GirtMA, [name]    ? GERD (gastroesophageal reflux disease)   ? Hiatal hernia   ? Hot flashes - on low dose HRT with gynecologist, Dr. NeNori Riis/13/2015  ? Hx of colonic polyps   ? Hyperlipidemia   ? MITRAL VALVE PROLAPSE 07/28/2007  ? Qualifier: Diagnosis of  By: AdJulien GirtMA, [name]    ? PONV (postoperative nausea and vomiting)   ? Supraventricular tachycardia ([place]  ? AV nodal reentry  s/p RFCA 2001  ? Thyroid disease   ? ?Past Surgical History:  ?Procedure Laterality Date  ? ABDOMINAL HYSTERECTOMY    ? heavy menses, prolonged periods. no cancer. cervix remained.   ? ATRIAL FIBRILLATION ABLATION N/A 04/30/2013  ? Procedure: ATRIAL FIBRILLATION ABLATION;  Surgeon: JaThompson GrayerMD;  [facility];  Service: Cardiovascular;  Laterality: N/A;PVI and CTI by Dr AlRayann Heman ? BREAST BIOPSY Left   ? benign  ? BREAST BIOPSY  2011  ? benign  ? CATARACT EXTRACTION BILATERAL W/ ANTERIOR VITRECTOMY    ? COLONOSCOPY    ? ESOPHAGOGASTRODUODENOSCOPY    ? implantable loop recorder removal    ?  KNEE ARTHROSCOPY Right 2012  ? KNEE SURGERY Right 08/22/2018  ? LOOP RECORDER INSERTION N/A 05/24/2017  ? Procedure: LOOP RECORDER INSERTION;  Surgeon: [name], MD;  Location: [facility];  Service: Cardiovascular;  Laterality: N/A;  ? OOPHORECTOMY    ? done for cyst on ovaries.   ? ROTATOR CUFF REPAIR  08/2001  ? Dr. Gladstone Lighter  ? SVT ABLATION N/A 07/20/2017  ? Procedure: SVT ABLATION;  Surgeon: [name], MD;  Location: [facility];  Service: Cardiovascular;  Laterality: N/A;  ? TEE WITHOUT CARDIOVERSION N/A 04/29/2013  ? Procedure: TRANSESOPHAGEAL ECHOCARDIOGRAM (TEE);  Surgeon: Jettie Booze, MD;  Location: [facility];  Service: Cardiovascular;  Laterality: N/A;  ? TOTAL KNEE ARTHROPLASTY Right 06/10/2019  ? Procedure: TOTAL  KNEE ARTHROPLASTY;  Surgeon: Gaynelle Arabian, MD;  Location: [facility];  Service: Orthopedics;  Laterality: Right;  ? UPPER GASTROINTESTINAL ENDOSCOPY    ? ?Allergies  ?Allergen Reactions  ? Propafenone Other (See Comments)  ?  BP too low  ? Codeine Other (See Comments)  ?  REACTION: nausea/dizziness  ? Tramadol Hcl Other (See Comments)  ?  REACTION: nausea--dizzy  ? Pravastatin   ?  Muscle aches, pains  ? Zocor [Simvastatin] Rash  ? ?Current Meds  ?Medication Sig  ? Coenzyme Q10 (CO Q-10) 200 MG CAPS Take by mouth daily.  ? Cyanocobalamin (B-12) 5000 MCG SUBL Take 1/3 of dropperful by mouth once a day  ? docusate sodium (COLACE) 100 MG capsule Take 100 mg by mouth daily as needed for mild constipation.  ? edoxaban (SAVAYSA) 60 MG TABS tablet Take 60 mg by mouth daily.  ? ELDERBERRY PO Take by mouth.  ? estradiol (VIVELLE-DOT) 0.1 MG/24HR patch Place 0.25 patches onto the skin 2 (two) times a week. Wed and Sat  ? Hypromellose (ARTIFICIAL TEARS OP) Apply 1 drop to eye daily as needed (dry eyes).  ? levothyroxine (SYNTHROID) 75 MCG tablet Take 1 tablet PO daily on Tuesday and Thursday. (On M, W, F, Sat, Sun will take 42mg)  ? levothyroxine (SYNTHROID) 88 MCG tablet Take 1 tablet PO Monday, Weds, Friday, Saturday, and Sunday (Taking 769m on Tues, thurs)  ? LUTEIN PO Take by mouth daily.  ? Magnesium 250 MG TABS Take by mouth at bedtime.  ? psyllium (METAMUCIL) 58.6 % powder Take 1 packet by mouth daily as needed (fiber).  ? verapamil (CALAN-SR) 120 MG CR tablet Take 1 tablet (120 mg total) by mouth daily.  ? VITAMIN D, CHOLECALCIFEROL, PO Take 4,000 Units by mouth daily.  ? ?Social History  ? ?Tobacco Use  ? Smoking status: Former  ?  Packs/day: 0.50  ?  Years: 3.00  ?  Pack years: 1.50  ?  Types: Cigarettes  ?  Quit date: 09/19/1966  ?  Years since quitting: 55.2  ? Smokeless tobacco: Never  ?Substance Use Topics  ? Alcohol use: Yes  ?  Alcohol/week: 3.0 standard drinks  ?  Types: 3 Glasses of wine per week  ?  Comment:  wine weekly  ? ?Family History  ?Problem Relation Age of Onset  ? Heart failure Mother   ?     died suddenly with this  ? Parkinson's disease Father   ? Diabetes Brother   ? Healthy Daughter   ? ? ? ?Review of Systems  ?Constitutional:  Negative for activity change, appetite change, chills, fatigue, fever and unexpected weight change.  ?HENT:  Negative for congestion, ear pain, hearing loss, sinus pressure, sinus  pain, sore throat and trouble swallowing.   ?Eyes:  Negative for pain and visual disturbance.  ?Respiratory:  Negative for cough, chest tightness, shortness of breath and wheezing.   ?Cardiovascular:  Positive for palpitations. Negative for chest pain and leg swelling.  ?Gastrointestinal:  Negative for abdominal pain, blood in stool, constipation, diarrhea, nausea and vomiting.  ?Genitourinary:  Negative for difficulty urinating and menstrual problem.  ?Musculoskeletal:  Positive for arthralgias. Negative for back pain.  ?Skin:  Negative for rash.  ?Neurological:  Negative for dizziness, weakness, numbness and headaches.  ?Hematological:  Negative for adenopathy. Does not bruise/bleed easily.  ?Psychiatric/Behavioral:  Negative for sleep disturbance and suicidal ideas. The patient is not nervous/anxious.   ? ?CBC:  ?Lab Results  ?Component Value Date  ? WBC 7.2 06/07/2021  ? HGB 14.1 06/07/2021  ? HGB 13.9 07/12/2017  ? HCT 40.9 06/07/2021  ? HCT 41.9 07/12/2017  ? MCH 32.9 05/27/2020  ? MCHC 34.3 06/07/2021  ? RDW 13.7 06/07/2021  ? RDW 13.9 07/12/2017  ? PLT 216.0 06/07/2021  ? PLT 263 07/12/2017  ? MPV 11.5 05/27/2020  ? ?CMP: ?Lab Results  ?Component Value Date  ? NA 136 06/07/2021  ? NA 142 07/12/2017  ? K 3.9 06/07/2021  ? CL 100 06/07/2021  ? CO2 30 06/07/2021  ? ANIONGAP 8 06/11/2019  ? GLUCOSE 82 06/07/2021  ? BUN 11 06/07/2021  ? BUN 13 07/12/2017  ? CREATININE 0.69 06/07/2021  ? CREATININE 0.72 05/27/2020  ? GFRAA >60 06/11/2019  ? CALCIUM 9.0 06/07/2021  ? PROT 6.9 06/07/2021  ? BILITOT 0.4  06/07/2021  ? ALKPHOS 86 06/07/2021  ? ALT 16 06/07/2021  ? AST 19 06/07/2021  ? ?LIPID: ?Lab Results  ?Component Value Date  ? CHOL 254 (H) 10/12/2017  ? TRIG 182.0 (H) 10/12/2017  ? HDL 39.10 01/24/201

## 2021-12-06 NOTE — Patient Instructions (Signed)
Tdap at pharmacy

## 2022-01-17 DIAGNOSIS — E039 Hypothyroidism, unspecified: Secondary | ICD-10-CM | POA: Diagnosis not present

## 2022-01-17 DIAGNOSIS — H903 Sensorineural hearing loss, bilateral: Secondary | ICD-10-CM | POA: Diagnosis not present

## 2022-01-17 DIAGNOSIS — Z822 Family history of deafness and hearing loss: Secondary | ICD-10-CM | POA: Diagnosis not present

## 2022-01-17 DIAGNOSIS — Z77122 Contact with and (suspected) exposure to noise: Secondary | ICD-10-CM | POA: Diagnosis not present

## 2022-01-18 ENCOUNTER — Other Ambulatory Visit: Payer: Self-pay | Admitting: Internal Medicine

## 2022-02-03 ENCOUNTER — Encounter: Payer: Self-pay | Admitting: Family Medicine

## 2022-02-10 MED ORDER — ESTRADIOL 0.1 MG/24HR TD PTTW
1.0000 | MEDICATED_PATCH | TRANSDERMAL | 1 refills | Status: DC
Start: 2022-02-10 — End: 2023-03-27

## 2022-03-03 DIAGNOSIS — M7062 Trochanteric bursitis, left hip: Secondary | ICD-10-CM | POA: Diagnosis not present

## 2022-04-05 DIAGNOSIS — M7062 Trochanteric bursitis, left hip: Secondary | ICD-10-CM | POA: Diagnosis not present

## 2022-04-12 ENCOUNTER — Other Ambulatory Visit: Payer: Self-pay | Admitting: Internal Medicine

## 2022-04-14 ENCOUNTER — Encounter: Payer: Self-pay | Admitting: Family Medicine

## 2022-04-14 ENCOUNTER — Ambulatory Visit (INDEPENDENT_AMBULATORY_CARE_PROVIDER_SITE_OTHER): Payer: Medicare Other | Admitting: Family Medicine

## 2022-04-14 VITALS — BP 102/60 | HR 75 | Temp 98.2°F | Ht 62.0 in | Wt 137.8 lb

## 2022-04-14 DIAGNOSIS — E039 Hypothyroidism, unspecified: Secondary | ICD-10-CM | POA: Diagnosis not present

## 2022-04-14 DIAGNOSIS — I480 Paroxysmal atrial fibrillation: Secondary | ICD-10-CM

## 2022-04-14 NOTE — Assessment & Plan Note (Signed)
Well controlled on the 88 mcg and 75 mcg levothyroxine alternating doses QOD. Will recheck her TSH in Sept for surveillance. She will return to see me in office in March at her annual preventative health visit.

## 2022-04-14 NOTE — Patient Instructions (Signed)
Please return to the office for your TSH level anytime after September 21.

## 2022-04-14 NOTE — Progress Notes (Signed)
Established Patient Office Visit  Subjective   Patient ID: Marissa Hart, female    DOB: [birth year]  Age: 87 y.o. MRN: [id number]  Chief Complaint  Patient presents with   Establish Care    Patient is here to establish care. States she is having some problems with her hip bursitis and has had 2 injections. States that she does sleep on her side at night, I recommended she try switching positions or using a pillow to keep the pressure off of her hip while sleeping. This should help improve the pain during the day.   Hypothyroid - patient is on 88 mcg and 75 mcg alternating doses every other day. Her last TSH was in March and was 2.22, well controlled. Patient reports her symptoms are well controlled at this dose of levothyroxine. No weight gain, constipation or hair loss. No depression sx. Her next TSH level will be due in Sept.  Paroxysmal atrial fibrillation - pt on verapamil 120 mg daily, reports she is doing well on this dose, no palpitations or dizziness, no SOB. Also taking her edoxaban 60 mg daily. States she is having easy bruising but no other issues with the medication. I reviewed her last CBC and it was WNL.   Current Outpatient Medications on File Prior to Visit  Medication Sig Dispense Refill   Coenzyme Q10 (CO Q-10) 200 MG CAPS Take by mouth daily.     Cyanocobalamin (B-12) 5000 MCG SUBL Take 1/3 of dropperful by mouth once a day     docusate sodium (COLACE) 100 MG capsule Take 100 mg by mouth daily as needed for mild constipation.     edoxaban (SAVAYSA) 60 MG TABS tablet Take 60 mg by mouth daily. 30 tablet 11   ELDERBERRY PO Take by mouth.     estradiol (VIVELLE-DOT) 0.1 MG/24HR patch Place 1 patch (0.1 mg total) onto the skin 2 (two) times a week. Wed and Sat 30 patch 1   Hypromellose (ARTIFICIAL TEARS OP) Apply 1 drop to eye daily as needed (dry eyes).     levothyroxine (SYNTHROID) 75 MCG tablet Take 1 tablet PO daily on Tuesday and Thursday. (On M, W, F, Sat, Sun will  take 20mg) 90 tablet 3   levothyroxine (SYNTHROID) 88 MCG tablet Take 1 tablet PO Monday, Weds, Friday, Saturday, and Sunday (Taking 730m on Tues, thurs) 90 tablet 1   LUTEIN PO Take by mouth daily.     Magnesium 250 MG TABS Take by mouth at bedtime.     psyllium (METAMUCIL) 58.6 % powder Take 1 packet by mouth daily as needed (fiber).     verapamil (CALAN-SR) 120 MG CR tablet Take 1 tablet by mouth once daily 90 tablet 0   VITAMIN D, CHOLECALCIFEROL, PO Take 4,000 Units by mouth daily.     No current facility-administered medications on file prior to visit.       Patient Active Problem List   Diagnosis Date Noted   B12 deficiency 05/27/2020   Myalgia due to statin 11/26/2019   OA (osteoarthritis) of knee 06/10/2019   Tachyarrhythmia 02/14/2018   Left hip pain 03/28/2016   Paroxysmal atrial fibrillation ([facility]07/13/2015   Hot flashes - on low dose HRT with gynecologist, Dr. NeNori Riis7/13/2015   Hypothyroidism 07/28/2007      Review of Systems  All other systems reviewed and are negative.     Objective:     BP 102/60 (BP Location: Left Arm, Patient Position: Sitting, Cuff Size: Normal)   Pulse  75   Temp 98.2 F (36.8 C) (Oral)   Ht '5\' 2"'$  (1.575 m)   Wt 137 lb 12.8 oz (62.5 kg)   SpO2 99%   BMI 25.20 kg/m    Physical Exam Vitals reviewed.  Constitutional:      Appearance: Normal appearance. She is well-groomed and normal weight.  HENT:     Head: Normocephalic and atraumatic.     Mouth/Throat:     Pharynx: Posterior oropharyngeal erythema: tsh.  Eyes:     Extraocular Movements: Extraocular movements intact.     Pupils: Pupils are equal, round, and reactive to light.  Cardiovascular:     Rate and Rhythm: Normal rate and regular rhythm.     Pulses: Normal pulses.     Heart sounds: S1 normal and S2 normal.  Pulmonary:     Effort: Pulmonary effort is normal.     Breath sounds: Normal breath sounds and air entry.  Abdominal:     General: Abdomen is flat. Bowel  sounds are normal.     Palpations: Abdomen is soft.  Musculoskeletal:        General: Normal range of motion.     Right lower leg: No edema.     Left lower leg: No edema.  Skin:    General: Skin is warm and dry.  Neurological:     Mental Status: She is alert and oriented to person, place, and time. Mental status is at baseline.     Gait: Gait is intact.  Psychiatric:        Mood and Affect: Mood and affect normal.        Speech: Speech normal.        Behavior: Behavior normal.        Judgment: Judgment normal.      No results found for any visits on 04/14/22.  Last CBC Lab Results  Component Value Date   WBC 6.5 12/06/2021   HGB 13.9 12/06/2021   HCT 41.0 12/06/2021   MCV 97.6 12/06/2021   MCH 32.9 05/27/2020   RDW 14.0 12/06/2021   PLT 198.0 12/06/2021   Last thyroid functions Lab Results  Component Value Date   TSH 2.22 12/06/2021      The ASCVD Risk score (Arnett DK, et al., 2019) failed to calculate for the following reasons:   The 2019 ASCVD risk score is only valid for ages 10 to 17    Assessment & Plan:   Problem List Items Addressed This Visit       Cardiovascular and Mediastinum   Paroxysmal atrial fibrillation (HCC) - Primary    Rate is well controlled on the 120 mg verapamil. Continue this medication. She is appropriately anticoagulated on the edoxaban 60 mg to reduce her risk of stroke. Continue this.        Endocrine   Hypothyroidism    Well controlled on the 88 mcg and 75 mcg levothyroxine alternating doses QOD. Will recheck her TSH in Sept for surveillance. She will return to see me in office in March at her annual preventative health visit.      Relevant Orders   TSH    Return in about 34 weeks (around 12/08/2022) for annual preventative health visit.    Farrel Conners, MD

## 2022-04-14 NOTE — Assessment & Plan Note (Signed)
Rate is well controlled on the 120 mg verapamil. Continue this medication. She is appropriately anticoagulated on the edoxaban 60 mg to reduce her risk of stroke. Continue this.

## 2022-05-16 ENCOUNTER — Emergency Department (HOSPITAL_BASED_OUTPATIENT_CLINIC_OR_DEPARTMENT_OTHER): Payer: Medicare Other | Admitting: Radiology

## 2022-05-16 ENCOUNTER — Encounter (HOSPITAL_BASED_OUTPATIENT_CLINIC_OR_DEPARTMENT_OTHER): Payer: Self-pay | Admitting: Emergency Medicine

## 2022-05-16 ENCOUNTER — Emergency Department (HOSPITAL_BASED_OUTPATIENT_CLINIC_OR_DEPARTMENT_OTHER)
Admission: EM | Admit: 2022-05-16 | Discharge: 2022-05-16 | Disposition: A | Discharge status: Home / Self Care | Payer: Medicare Other | Attending: Emergency Medicine | Admitting: Emergency Medicine

## 2022-05-16 ENCOUNTER — Other Ambulatory Visit (HOSPITAL_BASED_OUTPATIENT_CLINIC_OR_DEPARTMENT_OTHER): Payer: Self-pay

## 2022-05-16 ENCOUNTER — Other Ambulatory Visit: Payer: Self-pay

## 2022-05-16 DIAGNOSIS — W01190A Fall on same level from slipping, tripping and stumbling with subsequent striking against furniture, initial encounter: Secondary | ICD-10-CM | POA: Diagnosis not present

## 2022-05-16 DIAGNOSIS — M25512 Pain in left shoulder: Secondary | ICD-10-CM | POA: Diagnosis not present

## 2022-05-16 DIAGNOSIS — M25552 Pain in left hip: Secondary | ICD-10-CM | POA: Diagnosis not present

## 2022-05-16 DIAGNOSIS — S59912A Unspecified injury of left forearm, initial encounter: Secondary | ICD-10-CM | POA: Diagnosis present

## 2022-05-16 DIAGNOSIS — S51812A Laceration without foreign body of left forearm, initial encounter: Secondary | ICD-10-CM | POA: Diagnosis not present

## 2022-05-16 DIAGNOSIS — S8001XA Contusion of right knee, initial encounter: Secondary | ICD-10-CM | POA: Insufficient documentation

## 2022-05-16 DIAGNOSIS — W19XXXA Unspecified fall, initial encounter: Secondary | ICD-10-CM

## 2022-05-16 NOTE — ED Notes (Signed)
Discharge paperwork given and verbally understood. 

## 2022-05-16 NOTE — ED Notes (Signed)
Area cleaned/dried and covered.

## 2022-05-16 NOTE — ED Triage Notes (Signed)
Pt states she got up yesterday and her leg gave out causing her to fall. Pt states she has a large skin tear to L forearm. She has it bandaged. Also c/o soreness to L side of her body.

## 2022-05-16 NOTE — ED Provider Notes (Signed)
[facility] EMERGENCY DEPT Provider Note   CSN: [id number] Arrival date & time: 05/16/22  1413     History  Chief Complaint  Patient presents with   Marissa Hart is a 87 y.o. female.  With past medical history of paroxysmal atrial fibrillation no longer anticoagulated, osteoarthritis, MVP, hyperlipidemia who presents to the emergency department after a mechanical fall.  Patient states that 2 days ago she had a bad cramp in her right calf.  She states that it lasted for a while and then abated.  After that she says she was sore.  She states that yesterday morning she then got up to go to the bathroom.  She states that she stepped down with her right leg and had bad pain from the previous cramp and fell.  She fell onto her left side.  She did hit the bedside table with her left arm.  She did not hit her head or lose consciousness.  She is not anticoagulated.  She does have some soreness to the left shoulder and hip and abrasion to the left arm.  Denies neck pain, chest or abdominal pain.  Denies chest pain, dizziness or shortness of breath prior to falling.   Fall       Home Medications Prior to Admission medications   Medication Sig Start Date End Date Taking? Authorizing Provider  Coenzyme Q10 (CO Q-10) 200 MG CAPS Take by mouth daily.    [provider]  Cyanocobalamin (B-12) 5000 MCG SUBL Take 1/3 of dropperful by mouth once a day    [provider]  docusate sodium (COLACE) 100 MG capsule Take 100 mg by mouth daily as needed for mild constipation.    [provider]  edoxaban (SAVAYSA) 60 MG TABS tablet Take 60 mg by mouth daily. 06/16/20   [name], MD  ELDERBERRY PO Take by mouth.    [provider]  estradiol (VIVELLE-DOT) 0.1 MG/24HR patch Place 1 patch (0.1 mg total) onto the skin 2 (two) times a week. Wed and Sat 02/10/22   Koberlein, Steele Berg, MD  Hypromellose (ARTIFICIAL TEARS OP) Apply 1 drop to eye daily  as needed (dry eyes).    [provider]  levothyroxine (SYNTHROID) 75 MCG tablet Take 1 tablet PO daily on Tuesday and Thursday. (On M, W, F, Sat, Sun will take 69mg) 08/25/21   KCaren Macadam MD  levothyroxine (SYNTHROID) 88 MCG tablet Take 1 tablet PO Monday, Weds, Friday, Saturday, and Sunday (Taking 739m on Tues, thurs) 08/25/21   Koberlein, JuSteele BergMD  LUTEIN PO Take by mouth daily.    [provider]  Magnesium 250 MG TABS Take by mouth at bedtime.    [provider]  psyllium (METAMUCIL) 58.6 % powder Take 1 packet by mouth daily as needed (fiber).    [provider]  verapamil (CALAN-SR) 120 MG CR tablet Take 1 tablet by mouth once daily 04/12/22   Allred, JaJeneen RinksMD  VITAMIN D, CHOLECALCIFEROL, PO Take 4,000 Units by mouth daily.    [provider]      Allergies    Propafenone, Codeine, Tramadol hcl, Pravastatin, and Zocor [simvastatin]    Review of Systems   Review of Systems  Musculoskeletal:  Positive for arthralgias.  Skin:  Positive for wound.  All other systems reviewed and are negative.   Physical Exam Updated Vital Signs BP (!) 183/88 (BP Location: Right Arm)   Pulse 83   Temp 98.6 F (  37 C) (Oral)   Resp 15   Ht '5\' 2"'$  (1.575 m)   Wt 60.8 kg   SpO2 100%   BMI 24.51 kg/m  Physical Exam Vitals and nursing note reviewed.  Constitutional:      General: She is not in acute distress.    Appearance: Normal appearance. She is not ill-appearing or toxic-appearing.  HENT:     Head: Normocephalic and atraumatic.  Eyes:     General: No scleral icterus.    Extraocular Movements: Extraocular movements intact.     Pupils: Pupils are equal, round, and reactive to light.  Cardiovascular:     Pulses: Normal pulses.     Heart sounds: Normal heart sounds. No murmur heard. Pulmonary:     Effort: Pulmonary effort is normal. No respiratory distress.     Breath sounds: Normal breath sounds.  Abdominal:     Palpations:  Abdomen is soft.  Musculoskeletal:        General: Tenderness present. No deformity. Normal range of motion.     Left shoulder: Tenderness present. No swelling or deformity. Normal pulse.     Cervical back: Normal range of motion and neck supple. No tenderness.     Left hip: Bony tenderness present. Normal range of motion. Normal strength.     Comments: Left shoulder with tenderness but range of motion and to the posterior shoulder.  Obvious swelling. Does not appear to be dislocated   Skin:    General: Skin is warm and dry.     Capillary Refill: Capillary refill takes less than 2 seconds.     Findings: Bruising present. No rash.     Comments: Bruising to the right knee  Skin tear to the left forearm   Neurological:     General: No focal deficit present.     Mental Status: She is alert and oriented to person, place, and time. Mental status is at baseline.  Psychiatric:        Mood and Affect: Mood normal.        Behavior: Behavior normal.        Thought Content: Thought content normal.        Judgment: Judgment normal.    ED Results / Procedures / Treatments   Labs (all labs ordered are listed, but only abnormal results are displayed) Labs Reviewed - No data to display  EKG None  Radiology DG Hip Unilat With Pelvis 2-3 Views Left  Result Date: 05/16/2022 CLINICAL DATA:  Recent fall With left hip pain, initial encounter EXAM: DG HIP (WITH OR WITHOUT PELVIS) 3V LEFT COMPARISON:  None Available. FINDINGS: Pelvic ring is intact. Mild degenerative changes of the hip joints are seen. No acute fracture or dislocation is noted. No soft tissue abnormality is seen. IMPRESSION: Degenerative change without acute abnormality. Electronically Signed   By: Inez Catalina M.D.   On: 05/16/2022 20:20   DG Shoulder Left  Result Date: 05/16/2022 CLINICAL DATA:  Recent fall with left shoulder pain, initial encounter EXAM: LEFT SHOULDER - 2+ VIEW COMPARISON:  None Available. FINDINGS: There is no  evidence of fracture or dislocation. There is no evidence of arthropathy or other focal bone abnormality. Soft tissues are unremarkable. IMPRESSION: No acute abnormality noted. Electronically Signed   By: Inez Catalina M.D.   On: 05/16/2022 20:19    Procedures Procedures   Medications Ordered in ED Medications - No data to display  ED Course/ Medical Decision Making/ A&P  Medical Decision Making Amount and/or Complexity of Data Reviewed Radiology: ordered.  This patient presents to the ED with chief complaint(s) of fall with pertinent past medical history of paroxysmal atrial fibrillation, osteoarthritis which further complicates the presenting complaint. The complaint involves an extensive differential diagnosis and also carries with it a high risk of complications and morbidity.    The differential diagnosis includes musculoskeletal fracture, dislocation, contusion, intracranial, spinal, intrathoracic or intra-abdominal injuries  Additional history obtained: Additional history obtained from  friend at bedside Records reviewed Care Everywhere/External Records and Primary Care Documents  ED Course and Reassessment: 87 year old female who presents to the emergency department after mechanical fall.  She is overall very well-appearing and nontoxic in appearance.  She is alert and oriented.  She has no spinal tenderness.  Physical exam as noted above.  She is not anticoagulated Obtain x-ray of the left shoulder and hip which showed no fracture, subluxation. She does have a skin tear to the left forearm which we placed a Xeroform gauze and dressing over.  She is instructed to keep this on for 24 hours before removing it.  He can keep it clean with bacitracin after that.  She is agreeable to that.  I feel that this was a mechanical fall.  She is not anticoagulated and no C-spine tenderness.  Did not strike her head.  Do not feel that she needs CT imaging of her head or  C-spine.  She also had no dizziness, shortness of breath or chest pain prior to or after the fall.  Do not think that there is another etiology of her fall. Feel that she is safe for discharge at this time.  Given return precautions for any worsening symptoms.  Independent labs interpretation:  The following labs were independently interpreted: Not indicated  Independent visualization of imaging: - I independently visualized the following imaging with scope of interpretation limited to determining acute life threatening conditions related to emergency care: X-ray of the left hip and left shoulder, which revealed no fractures  Consultation: - Consulted or discussed management/test interpretation w/ external professional: Not indicated  Consideration for admission or further workup: Not indicated Social Determinants of health: None identified Final Clinical Impression(s) / ED Diagnoses Final diagnoses:  Fall, initial encounter    Rx / DC Orders ED Discharge Orders     None         Mickie Hillier, PA-C 05/16/22 2115    Elgie Congo, MD 05/17/22 [phone]

## 2022-05-16 NOTE — Discharge Instructions (Addendum)
You were seen in the emergency department today for a fall.  You do have a little abrasion to your arm.  Please keep the gauze on there for 24 hours prior to take it off.  Please keep it clean and dry after that.  Be careful when changing positions.  Please return for any concerns.

## 2022-05-17 ENCOUNTER — Encounter: Payer: Self-pay | Admitting: Family Medicine

## 2022-05-18 ENCOUNTER — Encounter: Payer: Self-pay | Admitting: Family Medicine

## 2022-06-02 ENCOUNTER — Ambulatory Visit: Payer: Medicare Other | Admitting: Student

## 2022-06-06 NOTE — Progress Notes (Signed)
PCP:  Farrel Conners, MD Primary Cardiologist: None Electrophysiologist: Dr. Rayann Heman -> Will Meredith Leeds, MD  Marissa Hart is a 87 y.o. female seen today for Will Meredith Leeds, MD for routine electrophysiology followup. Since last being seen in our clinic the patient reports doing well. She has rare, brief palpitations a couple of times a month, but is otherwise un-bothered. Back when she was having more issues, they could last up to 15 minutes at a time. she denies chest pain, dyspnea, PND, orthopnea, nausea, vomiting, dizziness, syncope, edema, weight gain, or early satiety.   Past Medical History:  Diagnosis Date   Acquired renal cyst of right kidney    Anxiety    Atrial fibrillation and flutter ([place])    s/p PVI and CTI by Dr Rayann Heman 04/2013   Blood transfusion    Cataract    CHEST WALL PAIN, HX OF 07/28/2007   Qualifier: Diagnosis of  By: Julien Girt CMA, [name]     Diverticulosis of colon    DJD (degenerative joint disease) 10/18/2011   Erysipelas    Fibromyalgia    FIBROMYALGIA 07/28/2007   Qualifier: Diagnosis of  By: Julien Girt CMA, [name]     GERD (gastroesophageal reflux disease)    Hiatal hernia    Hot flashes - on low dose HRT with gynecologist, Dr. Nori Riis 03/31/2014   Hx of colonic polyps    Hyperlipidemia    MITRAL VALVE PROLAPSE 07/28/2007   Qualifier: Diagnosis of  By: Julien Girt CMA, [name]     PONV (postoperative nausea and vomiting)    Supraventricular tachycardia ([place])    AV nodal reentry  s/p RFCA 2001   Thyroid disease    Past Surgical History:  Procedure Laterality Date   ABDOMINAL HYSTERECTOMY     heavy menses, prolonged periods. no cancer. cervix remained.    ATRIAL FIBRILLATION ABLATION N/A 04/30/2013   Procedure: ATRIAL FIBRILLATION ABLATION;  Surgeon: [name], MD;  Location: [facility] CATH LAB;  Service: Cardiovascular;  Laterality: N/A;PVI and CTI by Dr Rayann Heman    BREAST BIOPSY Left    benign   BREAST BIOPSY  2011   benign   CATARACT EXTRACTION BILATERAL  W/ ANTERIOR VITRECTOMY     COLONOSCOPY     ESOPHAGOGASTRODUODENOSCOPY     implantable loop recorder removal     KNEE ARTHROSCOPY Right 2012   KNEE SURGERY Right 08/22/2018   LOOP RECORDER INSERTION N/A 05/24/2017   Procedure: LOOP RECORDER INSERTION;  Surgeon: [name], MD;  Location: [place] CV LAB;  Service: Cardiovascular;  Laterality: N/A;   OOPHORECTOMY     done for cyst on ovaries.    ROTATOR CUFF REPAIR  08/2001   Dr. Gladstone Lighter   SVT ABLATION N/A 07/20/2017   Procedure: SVT ABLATION;  Surgeon: [name], MD;  Location: [place] CV LAB;  Service: Cardiovascular;  Laterality: N/A;   TEE WITHOUT CARDIOVERSION N/A 04/29/2013   Procedure: TRANSESOPHAGEAL ECHOCARDIOGRAM (TEE);  Surgeon: Jettie Booze, MD;  Location: [facility];  Service: Cardiovascular;  Laterality: N/A;   TOTAL KNEE ARTHROPLASTY Right 06/10/2019   Procedure: TOTAL KNEE ARTHROPLASTY;  Surgeon: Gaynelle Arabian, MD;  Location: [facility] ORS;  Service: Orthopedics;  Laterality: Right;   UPPER GASTROINTESTINAL ENDOSCOPY      Current Outpatient Medications  Medication Sig Dispense Refill   Coenzyme Q10 (CO Q-10) 200 MG CAPS Take by mouth daily.     Cyanocobalamin (B-12) 5000 MCG SUBL Take 1/3 of dropperful by mouth once a day     docusate  sodium (COLACE) 100 MG capsule Take 100 mg by mouth daily as needed for mild constipation.     edoxaban (SAVAYSA) 60 MG TABS tablet Take 60 mg by mouth daily. 30 tablet 11   ELDERBERRY PO Take by mouth.     estradiol (VIVELLE-DOT) 0.1 MG/24HR patch Place 1 patch (0.1 mg total) onto the skin 2 (two) times a week. Wed and Sat 30 patch 1   Hypromellose (ARTIFICIAL TEARS OP) Apply 1 drop to eye daily as needed (dry eyes).     levothyroxine (SYNTHROID) 75 MCG tablet Take 1 tablet PO daily on Tuesday and Thursday. (On M, W, F, Sat, Sun will take 100mg) 90 tablet 3   levothyroxine (SYNTHROID) 88 MCG tablet Take 1 tablet PO Monday, Weds, Friday, Saturday, and Sunday (Taking 733m  on Tues, thurs) 90 tablet 1   LUTEIN PO Take by mouth daily.     Magnesium 250 MG TABS Take by mouth at bedtime.     psyllium (METAMUCIL) 58.6 % powder Take 1 packet by mouth daily as needed (fiber).     verapamil (CALAN-SR) 120 MG CR tablet Take 1 tablet by mouth once daily 90 tablet 0   VITAMIN D, CHOLECALCIFEROL, PO Take 4,000 Units by mouth daily.     No current facility-administered medications for this visit.    Allergies  Allergen Reactions   Propafenone Other (See Comments)    BP too low   Codeine Other (See Comments)    REACTION: nausea/dizziness   Tramadol Hcl Other (See Comments)    REACTION: nausea--dizzy   Pravastatin     Muscle aches, pains   Zocor [Simvastatin] Rash    Social History   Socioeconomic History   Marital status: Widowed    Spouse name: Not on file   Number of children: 1   Years of education: Not on file   Highest education level: Not on file  Occupational History    Employer: RETIRED  Tobacco Use   Smoking status: Former    Packs/day: 0.50    Years: 3.00    Total pack years: 1.50    Types: Cigarettes    Quit date: 09/19/1966    Years since quitting: 55.7   Smokeless tobacco: Never  Substance and Sexual Activity   Alcohol use: Yes    Alcohol/week: 3.0 standard drinks of alcohol    Types: 3 Glasses of wine per week    Comment: wine weekly   Drug use: No   Sexual activity: Not on file  Other Topics Concern   Not on file  Social History Narrative   Work or School: none      Home Situation: lives alone, widowed      Spiritual Beliefs: Christian      Lifestyle: cycles on stationary bike, calisthenics, active with church, family, friends         1/October 23, 2017 Spouse died 3 years ago at 957nd dx with liver cancer    Children 1 dtr    2 grands dtr   5 great grands; 5 and 6 and the oldest one 9 60  [place]opt[place] a son from [place]   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    Social  Determinants of Health   Financial Resource Strain: [place](10/26/2021)   Overall Financial Resource Strain (CARDIA)    Difficulty of Paying Living Expenses: Not hard at all  Food Insecurity: No [place]  10/26/2021)   Hunger Vital Sign    Worried About Running Out of Food in the Last Year: Never true    [place] in the Last Year: Never true  Transportation Needs: No Transportation Needs (10/26/2021)   PRAPARE - Hydrologist (Medical): No    Lack of Transportation (Non-Medical): No  Physical Activity: Sufficiently Active (10/26/2021)   Exercise Vital Sign    Days of Exercise per Week: 7 days    Minutes of Exercise per Session: 30 min  Stress: No Stress Concern Present (10/26/2021)   [place]    Feeling of Stress : Not at all  Social Connections: Moderately Integrated (10/26/2021)   Social Connection and Isolation Panel [NHANES]    Frequency of Communication with Friends and Family: More than three times a week    Frequency of Social Gatherings with Friends and Family: More than three times a week    Attends Religious Services: More than 4 times per year    Active Member of [employer] or Organizations: Yes    Attends Archivist Meetings: More than 4 times per year    Marital Status: Widowed  Intimate Partner Violence: Not At Risk (10/26/2021)   Humiliation, Afraid, Rape, and Kick questionnaire    Fear of Current or Ex-Partner: No    Emotionally Abused: No    Physically Abused: No    Sexually Abused: No     Review of Systems: All other systems reviewed and are otherwise negative except as noted above.  Physical Exam: Vitals:   06/14/22 1056  BP: (!) 142/82  Pulse: 67  SpO2: 98%  Weight: 138 lb (62.6 kg)  Height: '5\' 2"'$  (1.575 m)    GEN- The patient is well appearing, alert and oriented x 3 today.   HEENT: normocephalic, atraumatic; sclera clear, conjunctiva pink;  hearing intact; oropharynx clear; neck supple, no JVP Lymph- no cervical lymphadenopathy Lungs- Clear to ausculation bilaterally, normal work of breathing.  No wheezes, rales, rhonchi Heart- Regular rate and rhythm, no murmurs, rubs or gallops, PMI not laterally displaced GI- soft, non-tender, non-distended, bowel sounds present, no hepatosplenomegaly Extremities- No peripheral edema. no clubbing or cyanosis; DP/PT/radial pulses 2+ bilaterally MS- no significant deformity or atrophy Skin- warm and dry, no rash or lesion Psych- euthymic mood, full affect Neuro- strength and sensation are intact  EKG is ordered. Personal review of EKG from today shows NSR at 85 bpm  Additional studies reviewed include: Previous EP office notes.   Assessment and Plan:  Multiple atrial tachycardia morphologies Total of three ablations all together (SVT in 2018, PVI 2014, and another many years prior but can't remember Not amenable to ablation Doing well with rare palpitations. ILR reached RRT in 11/2020 and was removed 05/2021    2. Paroxysmal atrial fibrillation Well controlled Chads2vasc score is 3.  She is on savaysa  Follow up with Dr. Curt Bears in 12 months to establish from Dr. Rayann Heman, at pt request  Shirley Friar, PA-C  06/14/22 11:10 AM

## 2022-06-09 ENCOUNTER — Encounter: Payer: Self-pay | Admitting: Family Medicine

## 2022-06-09 NOTE — Telephone Encounter (Signed)
Noted  

## 2022-06-14 ENCOUNTER — Encounter: Payer: Self-pay | Admitting: Student

## 2022-06-14 ENCOUNTER — Ambulatory Visit: Payer: Medicare Other | Attending: Student | Admitting: Student

## 2022-06-14 ENCOUNTER — Other Ambulatory Visit (INDEPENDENT_AMBULATORY_CARE_PROVIDER_SITE_OTHER): Payer: Medicare Other

## 2022-06-14 VITALS — BP 142/82 | HR 67 | Ht 62.0 in | Wt 138.0 lb

## 2022-06-14 DIAGNOSIS — E039 Hypothyroidism, unspecified: Secondary | ICD-10-CM

## 2022-06-14 DIAGNOSIS — I471 Supraventricular tachycardia: Secondary | ICD-10-CM | POA: Diagnosis not present

## 2022-06-14 DIAGNOSIS — I480 Paroxysmal atrial fibrillation: Secondary | ICD-10-CM | POA: Diagnosis not present

## 2022-06-14 LAB — TSH: TSH: 2.14 u[IU]/mL (ref 0.35–5.50)

## 2022-06-14 NOTE — Progress Notes (Signed)
normal

## 2022-06-14 NOTE — Patient Instructions (Signed)
Medication Instructions:  Your physician recommends that you continue on your current medications as directed. Please refer to the Current Medication list given to you today.  *If you need a refill on your cardiac medications before your next appointment, please call your pharmacy*   Lab Work: None If you have labs (blood work) drawn today and your tests are completely normal, you will receive your results only by: [place] (if you have MyChart) OR A paper copy in the mail If you have any lab test that is abnormal or we need to change your treatment, we will call you to review the results.   Follow-Up: At [facility], you and your health needs are our priority.  As part of our continuing mission to provide you with exceptional heart care, we have created designated Provider Care Teams.  These Care Teams include your primary Cardiologist (physician) and Advanced Practice Providers (APPs -  Physician Assistants and Nurse Practitioners) who all work together to provide you with the care you need, when you need it.  Your next appointment:   1 year(s)  The format for your next appointment:   In Person  Provider:   Allegra Lai, MD

## 2022-06-24 ENCOUNTER — Ambulatory Visit (INDEPENDENT_AMBULATORY_CARE_PROVIDER_SITE_OTHER): Payer: Medicare Other

## 2022-06-24 DIAGNOSIS — Z23 Encounter for immunization: Secondary | ICD-10-CM

## 2022-07-27 ENCOUNTER — Other Ambulatory Visit: Payer: Self-pay | Admitting: Internal Medicine

## 2022-10-07 DIAGNOSIS — K080 Exfoliation of teeth due to systemic causes: Secondary | ICD-10-CM | POA: Diagnosis not present

## 2022-10-27 ENCOUNTER — Telehealth (INDEPENDENT_AMBULATORY_CARE_PROVIDER_SITE_OTHER): Payer: Medicare Other | Admitting: Family Medicine

## 2022-10-27 ENCOUNTER — Encounter: Payer: Self-pay | Admitting: Family Medicine

## 2022-10-27 VITALS — BP 132/73 | HR 68 | Wt 136.0 lb

## 2022-10-27 DIAGNOSIS — Z0000 Encounter for general adult medical examination without abnormal findings: Secondary | ICD-10-CM

## 2022-10-27 NOTE — Progress Notes (Signed)
PATIENT CHECK-IN and HEALTH RISK ASSESSMENT QUESTIONNAIRE:  -completed by phone/video for upcoming Medicare Preventive Visit  Pre-Visit Check-in: 1)Vitals (height, wt, BP, etc) - record in vitals section for visit on day of visit 2)Review and Update Medications, Allergies PMH, Surgeries, Social history in Epic 3)Hospitalizations in the last year with date/reason? No  4)Review and Update Care Team (patient's specialists) in Epic 5) Complete PHQ9 in Epic  6) Complete Fall Screening in Epic 7)Review all Health Maintenance Due and order under PCP if not done.  8)Medicare Wellness Questionnaire: Answer theses question about your habits: Do you drink alcohol? Yes, 1 small 3oz glass a few times a week Have you ever smoked?Yes  over 60 years ago and only socially N/A How many packs a day do/did you smoke? N/A Do you use smokeless tobacco?No Do you use an illicit drugs?No Do you exercises? Yes   IF so, what type and how many days/minutes per week?20  mins of exercise daily stretching and workout routine, hand weights, and the floor cycle for 52mns daily and additionally does some walking several days per week, she feels her balance is good - she does balance exercises too Are you sexually active? No Number of partners? N/A Typical breakfast: raisin bread, coffee, fruit Typical lunch: toast, poached egg, salad Typical dinner: Meat,vegetables Typical snacks:Nuts, dark chocolate candy  Beverages: Water, coffee  Answer theses question about you: Can you perform most household chores?Yes Do you find it hard to follow a conversation in a noisy room?No Do you often ask people to speak up or repeat themselves?Pt wears hearing aids Do you feel that you have a problem with memory- No Do you balance your checkbook and or bank acounts?No, pt has online banking  Do you feel safe at home?Yes Last dentist visit?Jan 2024 Do you need assistance with any of the following: Please note if so  No  Driving?  Feeding yourself?  Getting from bed to chair?  Getting to the toilet?  Bathing or showering?  Dressing yourself?  Managing money?  Climbing a flight of stairs  Preparing meals?  Do you have Advanced Directives in place (Living Will, Healthcare Power or Attorney)? Yes   Last eye Exam and location-06/2021   Do you currently use prescribed or non-prescribed narcotic or opioid pain medications? No  Do you have a history or close family history of breast, ovarian, tubal or peritoneal cancer or a family member with BRCA (breast cancer susceptibility 1 and 2) gene mutations?  Nurse/Assistant Credentials/time stamp:   ----------------------------------------------------------------------------------------------------------------------------------------------------------------------------------------------------------------------   MEDICARE ANNUAL PREVENTIVE VISIT WITH PROVIDER: (Welcome to [employer] initial annual wellness or annual wellness exam)  Virtual Visit via Phone Note  I connected with Marissa Hart  on 10/27/22 by phone and verified that I am speaking with the correct person using two identifiers.  Location patient: home Location provider:work or home office Persons participating in the virtual visit: patient, provider  Concerns and/or follow up today: stable, no concerns today.   See HM section in Epic for other details of completed HM.    ROS: negative for report of fevers, unintentional weight loss, vision changes, vision loss, hearing loss or change, chest pain, sob, hemoptysis, melena, hematochezia, hematuria, genital discharge or lesions, falls, bleeding or bruising, loc, thoughts of suicide or self harm, memory loss  Patient-completed extensive health risk assessment - reviewed and discussed with the patient: See Health Risk Assessment completed with patient prior to the visit either above or in recent phone note. This was reviewed in detailed with  the  patient today and appropriate recommendations, orders and referrals were placed as needed per Summary below and patient instructions.   Review of Medical History: -PMH, [place], Family History and current specialty and care providers reviewed and updated and listed below   Patient Care Team: Farrel Conners, MD as PCP - General (Family Medicine) Constance Haw, MD as PCP - Electrophysiology (Cardiology) Maisie Fus, MD (Inactive) as Consulting Physician (Obstetrics and Gynecology) [name], MD (Inactive) as Consulting Physician (Cardiology) Gaynelle Arabian, MD as Consulting Physician (Orthopedic Surgery)   Past Medical History:  Diagnosis Date   Acquired renal cyst of right kidney    Anxiety    Atrial fibrillation and flutter ([place])    s/p PVI and CTI by Dr Rayann Heman 04/2013   Blood transfusion    Cataract    CHEST WALL PAIN, HX OF 07/28/2007   Qualifier: Diagnosis of  By: Julien Girt CMA, [name]     Diverticulosis of colon    DJD (degenerative joint disease) 10/18/2011   Erysipelas    Fibromyalgia    FIBROMYALGIA 07/28/2007   Qualifier: Diagnosis of  By: Julien Girt CMA, [name]     GERD (gastroesophageal reflux disease)    Hiatal hernia    Hot flashes - on low dose HRT with gynecologist, Dr. Nori Riis 03/31/2014   Hx of colonic polyps    Hyperlipidemia    MITRAL VALVE PROLAPSE 07/28/2007   Qualifier: Diagnosis of  By: Julien Girt CMA, [name]     PONV (postoperative nausea and vomiting)    Supraventricular tachycardia ([place])    AV nodal reentry  s/p RFCA 2001   Thyroid disease     Past Surgical History:  Procedure Laterality Date   ABDOMINAL HYSTERECTOMY     heavy menses, prolonged periods. no cancer. cervix remained.    ATRIAL FIBRILLATION ABLATION N/A 04/30/2013   Procedure: ATRIAL FIBRILLATION ABLATION;  Surgeon: [name], MD;  Location: [facility] CATH LAB;  Service: Cardiovascular;  Laterality: N/A;PVI and CTI by Dr Rayann Heman    BREAST BIOPSY Left    benign   BREAST BIOPSY  2011   benign    CATARACT EXTRACTION BILATERAL W/ ANTERIOR VITRECTOMY     COLONOSCOPY     ESOPHAGOGASTRODUODENOSCOPY     implantable loop recorder removal     KNEE ARTHROSCOPY Right 2012   KNEE SURGERY Right 08/22/2018   LOOP RECORDER INSERTION N/A 05/24/2017   Procedure: LOOP RECORDER INSERTION;  Surgeon: [name], MD;  Location: [facility] CV LAB;  Service: Cardiovascular;  Laterality: N/A;   OOPHORECTOMY     done for cyst on ovaries.    ROTATOR CUFF REPAIR  08/2001   Dr. Gladstone Lighter   SVT ABLATION N/A 07/20/2017   Procedure: SVT ABLATION;  Surgeon: [name], MD;  Location: [place] CV LAB;  Service: Cardiovascular;  Laterality: N/A;   TEE WITHOUT CARDIOVERSION N/A 04/29/2013   Procedure: TRANSESOPHAGEAL ECHOCARDIOGRAM (TEE);  Surgeon: Jettie Booze, MD;  Location: [facility];  Service: Cardiovascular;  Laterality: N/A;   TOTAL KNEE ARTHROPLASTY Right 06/10/2019   Procedure: TOTAL KNEE ARTHROPLASTY;  Surgeon: Gaynelle Arabian, MD;  Location: [facility] ORS;  Service: Orthopedics;  Laterality: Right;   UPPER GASTROINTESTINAL ENDOSCOPY      Social History   Socioeconomic History   Marital status: Widowed    Spouse name: Not on file   Number of children: 1   Years of education: Not on file   Highest education level: Not on file  Occupational History    Employer: RETIRED  Tobacco Use   Smoking status: Former    Packs/day: 0.50    Years: 3.00    Total pack years: 1.50    Types: Cigarettes    Quit date: 09/19/1966    Years since quitting: 56.1   Smokeless tobacco: Never  Substance and Sexual Activity   Alcohol use: Yes    Alcohol/week: 3.0 standard drinks of alcohol    Types: 3 Glasses of wine per week    Comment: wine weekly   Drug use: No   Sexual activity: Not on file  Other Topics Concern   Not on file  Social History Narrative   Work or School: none      Home Situation: lives alone, widowed      Spiritual Beliefs: Christian      Lifestyle: cycles on stationary bike,  calisthenics, active with church, family, friends         2017-10-22   Spouse died 3 years ago at 35 and dx with liver cancer    Children 1 dtr    2 grands dtr   5 great grands; 5 and 6 and the oldest one 61    Adopted a son from [place]   Children spoke [place] and [place];    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    Social Determinants of Health   Financial Resource Strain: [place]  (10/26/2021)   Overall Financial Resource Strain (CARDIA)    Difficulty of Paying Living Expenses: Not hard at all  Food Insecurity: No Food Insecurity (10/26/2021)   Hunger Vital Sign    Worried About Running Out of Food in the Last Year: Never true    [place] in the Last Year: Never true  Transportation Needs: No Transportation Needs (10/26/2021)   PRAPARE - Hydrologist (Medical): No    Lack of Transportation (Non-Medical): No  Physical Activity: Sufficiently Active (10/26/2021)   Exercise Vital Sign    Days of Exercise per Week: 7 days    Minutes of Exercise per Session: 30 min  Stress: No Stress Concern Present (10/26/2021)   [place]    Feeling of Stress : Not at all  Social Connections: Moderately Integrated (10/26/2021)   Social Connection and Isolation Panel [NHANES]    Frequency of Communication with Friends and Family: More than three times a week    Frequency of Social Gatherings with Friends and Family: More than three times a week    Attends Religious Services: More than 4 times per year    Active Member of [employer] or Organizations: Yes    Attends Archivist Meetings: More than 4 times per year    Marital Status: Widowed  Intimate Partner Violence: Not At Risk (10/26/2021)   Humiliation, Afraid, Rape, and Kick questionnaire    Fear of Current or Ex-Partner: No    Emotionally Abused: No    Physically Abused: No    Sexually Abused: No    Family History  Problem  Relation Age of Onset   Heart failure Mother        died suddenly with this   Parkinson's disease Father    Diabetes Brother    Healthy Daughter     Current Outpatient Medications on File Prior to Visit  Medication Sig Dispense Refill   Coenzyme Q10 (CO Q-10) 200 MG CAPS Take by mouth daily.     Cyanocobalamin (B-12) 5000 MCG SUBL  Take 1/3 of dropperful by mouth once a day     docusate sodium (COLACE) 100 MG capsule Take 100 mg by mouth daily as needed for mild constipation.     edoxaban (SAVAYSA) 60 MG TABS tablet Take 60 mg by mouth daily. 30 tablet 11   ELDERBERRY PO Take by mouth.     estradiol (VIVELLE-DOT) 0.1 MG/24HR patch Place 1 patch (0.1 mg total) onto the skin 2 (two) times a week. Wed and Sat 30 patch 1   Hypromellose (ARTIFICIAL TEARS OP) Apply 1 drop to eye daily as needed (dry eyes).     levothyroxine (SYNTHROID) 75 MCG tablet Take 1 tablet PO daily on Tuesday and Thursday. (On M, W, F, Sat, Sun will take 87mg) 90 tablet 3   levothyroxine (SYNTHROID) 88 MCG tablet Take 1 tablet PO Monday, Weds, Friday, Saturday, and Sunday (Taking 741m on Tues, thurs) 90 tablet 1   LUTEIN PO Take by mouth daily.     Magnesium 250 MG TABS Take by mouth at bedtime.     psyllium (METAMUCIL) 58.6 % powder Take 1 packet by mouth daily as needed (fiber).     verapamil (CALAN-SR) 120 MG CR tablet Take 1 tablet (120 mg total) by mouth daily. 90 tablet 3   VITAMIN D, CHOLECALCIFEROL, PO Take 4,000 Units by mouth daily.     No current facility-administered medications on file prior to visit.    Allergies  Allergen Reactions   Propafenone Other (See Comments)    BP too low   Codeine Other (See Comments)    REACTION: nausea/dizziness   Tramadol Hcl Other (See Comments)    REACTION: nausea--dizzy   Pravastatin     Muscle aches, pains   Zocor [Simvastatin] Rash       Physical Exam Vitals:   10/27/22 1033  BP: 132/73  Pulse: 68   Estimated body mass index is 24.87 kg/m as  calculated from the following:   Height as of 06/14/22: '5\' 2"'$  (1.575 m).   Weight as of this encounter: 136 lb (61.7 kg).  EKG (optional): deferred due to virtual visit  GENERAL: alert, oriented, no acute distress detected, full vision exam deferred due to pandemic and/or virtual encounter  PSYCH/NEURO: pleasant and cooperative, no obvious depression or anxiety, speech and thought processing grossly intact, Cognitive function grossly intact  Flowsheet Row [facility] Total Score 0           10/27/2022   10:15 AM 10/27/2022   10:14 AM 10/26/2021    9:19 AM 10/21/2020    1:37 PM 11/25/2019    9:21 AM  Depression screen PHQ 2/9  Decreased Interest 0 0 0 0 0  Down, Depressed, Hopeless  0 0 0 0  PHQ - 2 Score 0 0 0 0 0  Altered sleeping 0      Tired, decreased energy 0      Change in appetite 0      Feeling bad or failure about yourself  0      Trouble concentrating 0      Moving slowly or fidgety/restless 0      Suicidal thoughts 0      PHQ-9 Score 0      Difficult doing work/chores Not difficult at all           10/21/2020    1:33 PM 10/26/2021    9:24 AM 12/06/2021   11:10 AM 05/16/2022  2:43 PM 10/27/2022   10:12 AM  Fall Risk  Falls in the past year? 0 0 0  1  Was there an injury with Fall? 0 0 0  1  Was there an injury with Fall? - Comments     slight scrap on arm  Fall Risk Category Calculator 0 0 0  2  Fall Risk Category (Retired) Low Low Low    (RETIRED) Patient Fall Risk Level Low fall risk Low fall risk Low fall risk Moderate fall risk   Patient at Risk for Falls Due to No Fall Risks No Fall Risks No Fall Risks    Fall risk Follow up Falls evaluation completed;Falls prevention discussed  Falls evaluation completed    She fell once, but was not related to balance - got a charlie horse in her leg and the pain of it brought her down. She has not had theses leg cramps since - gets the rarely. Trying to stay  hydrated.    SUMMARY AND PLAN:  Encounter for Medicare annual wellness exam    Discussed applicable health maintenance/preventive health measures and advised and referred or ordered per patient preferences: She has declined further mammograms and vaccines.   Health Maintenance  Topic Date Due   COVID-19 Vaccine (5 - 2023-24 season) 11/12/2024 (Originally 05/20/2022)   MAMMOGRAM  10/27/2028 (Originally 04/09/2022)   DEXA SCAN  12/15/2022   Medicare Annual Wellness (AWV)  10/28/2023   Pneumonia Vaccine 25+ Years old  Completed   INFLUENZA VACCINE  Completed   Zoster Vaccines- Shingrix  Completed   HPV VACCINES  Aged Out   DTaP/Tdap/Td  Discontinued   Education and counseling on the following was provided based on the above review of health and a plan/checklist for the patient, along with additional information discussed, was provided for the patient in the patient instructions :    -Provided counseling and plan for increased risk of falling if applicable per above screening. However, it sounds like she is doing great and I am so impressed with her exercise routine that meets all of the exercise guidelines.  -Also congratulated on healthy diet and info provided in patient instructions -Recommended continued regular exercise. -Advise yearly dental visits at minimum and regular eye exams -Advised and counseled on alcohol use/ safe limits  Follow up: see patient instructions     There are no Patient Instructions on file for this visit.  Marissa Kern, DO

## 2022-10-28 ENCOUNTER — Other Ambulatory Visit: Payer: Self-pay | Admitting: *Deleted

## 2022-10-31 MED ORDER — LEVOTHYROXINE SODIUM 88 MCG PO TABS
ORAL_TABLET | ORAL | 1 refills | Status: DC
Start: 2022-10-31 — End: 2023-04-19

## 2022-11-01 ENCOUNTER — Encounter: Payer: Self-pay | Admitting: Family Medicine

## 2022-11-19 DIAGNOSIS — K080 Exfoliation of teeth due to systemic causes: Secondary | ICD-10-CM | POA: Diagnosis not present

## 2022-12-21 ENCOUNTER — Encounter: Payer: Self-pay | Admitting: Family Medicine

## 2022-12-21 ENCOUNTER — Ambulatory Visit (INDEPENDENT_AMBULATORY_CARE_PROVIDER_SITE_OTHER): Payer: Medicare Other | Admitting: Family Medicine

## 2022-12-21 VITALS — BP 108/70 | HR 70 | Temp 98.3°F | Ht 62.0 in | Wt 135.7 lb

## 2022-12-21 DIAGNOSIS — I480 Paroxysmal atrial fibrillation: Secondary | ICD-10-CM | POA: Diagnosis not present

## 2022-12-21 DIAGNOSIS — Z780 Asymptomatic menopausal state: Secondary | ICD-10-CM

## 2022-12-21 DIAGNOSIS — E039 Hypothyroidism, unspecified: Secondary | ICD-10-CM | POA: Diagnosis not present

## 2022-12-21 DIAGNOSIS — Z0000 Encounter for general adult medical examination without abnormal findings: Secondary | ICD-10-CM

## 2022-12-21 DIAGNOSIS — E538 Deficiency of other specified B group vitamins: Secondary | ICD-10-CM | POA: Diagnosis not present

## 2022-12-21 LAB — COMPREHENSIVE METABOLIC PANEL
ALT: 12 U/L (ref 0–35)
AST: 16 U/L (ref 0–37)
Albumin: 4.1 g/dL (ref 3.5–5.2)
Alkaline Phosphatase: 88 U/L (ref 39–117)
BUN: 14 mg/dL (ref 6–23)
CO2: 31 mEq/L (ref 19–32)
Calcium: 9.1 mg/dL (ref 8.4–10.5)
Chloride: 103 mEq/L (ref 96–112)
Creatinine, Ser: 0.83 mg/dL (ref 0.40–1.20)
GFR: 64.01 mL/min (ref >60.00)
Glucose, Bld: 88 mg/dL (ref 70–99)
Potassium: 4.1 mEq/L (ref 3.5–5.1)
Sodium: 137 mEq/L (ref 135–145)
Total Bilirubin: 0.4 mg/dL (ref 0.2–1.2)
Total Protein: 6.4 g/dL (ref 6.0–8.3)

## 2022-12-21 LAB — VITAMIN B12: Vitamin B-12: 488 pg/mL (ref 211–911)

## 2022-12-21 LAB — TSH: TSH: 0.93 u[IU]/mL (ref 0.35–5.50)

## 2022-12-21 NOTE — Progress Notes (Signed)
Complete physical exam  Patient: Marissa Hart   DOB: 03/11/1935   88 y.o. Female  MRN: [id number]  Subjective:    Chief Complaint  Patient presents with   Annual Exam    SELMA LANIUS is a 88 y.o. female who presents today for a complete physical exam. She reports consuming a general diet. Home exercise routine includes stretching and walking 0.5 hrs per day. She generally feels well. She reports sleeping well. She does not have additional problems to discuss today.    Most recent fall risk assessment:    12/21/2022   10:22 AM  [place] in the past year? 1  Number falls in past yr: 0  Injury with Fall? 1  Risk for fall due to : No Fall Risks  Follow up Falls evaluation completed     Most recent depression screenings:    10/27/2022   10:15 AM 10/27/2022   10:14 AM  PHQ 2/9 Scores  PHQ - 2 Score 0 0  PHQ- 9 Score 0     Vision:Within last year and Dental: No current dental problems and Receives regular dental care  Patient Active Problem List   Diagnosis Date Noted   B12 deficiency 05/27/2020   Myalgia due to statin 11/26/2019   OA (osteoarthritis) of knee 06/10/2019   Tachyarrhythmia 02/14/2018   Left hip pain 03/28/2016   Paroxysmal atrial fibrillation 03/31/2014   Hot flashes - on low dose HRT with gynecologist, Dr. Nori Riis 03/31/2014   Hypothyroidism 07/28/2007      Patient Care Team: Farrel Conners, MD as PCP - General (Family Medicine) Constance Haw, MD as PCP - Electrophysiology (Cardiology) Maisie Fus, MD (Inactive) as Consulting Physician (Obstetrics and Gynecology) [name], MD (Inactive) as Consulting Physician (Cardiology) Gaynelle Arabian, MD as Consulting Physician (Orthopedic Surgery)   Outpatient Medications Prior to Visit  Medication Sig   Cyanocobalamin (B-12) 5000 MCG SUBL Take 1/3 of dropperful by mouth once a day   docusate sodium (COLACE) 100 MG capsule Take 100 mg by mouth daily as needed for mild  constipation.   edoxaban (SAVAYSA) 60 MG TABS tablet Take 60 mg by mouth daily.   ELDERBERRY PO Take by mouth.   estradiol (VIVELLE-DOT) 0.1 MG/24HR patch Place 1 patch (0.1 mg total) onto the skin 2 (two) times a week. Wed and Sat (Patient taking differently: Place 0.25 patches onto the skin 2 (two) times a week. Wed and Sat)   Hypromellose (ARTIFICIAL TEARS OP) Apply 1 drop to eye daily as needed (dry eyes).   levothyroxine (SYNTHROID) 75 MCG tablet Take 1 tablet PO daily on Tuesday and Thursday. (On M, W, F, Sat, Sun will take 94mcg)   levothyroxine (SYNTHROID) 88 MCG tablet Take 1 tablet PO Monday, Weds, Friday, Saturday, and Sunday (Taking 52mcg on Tues, thurs)   LUTEIN PO Take by mouth daily.   Magnesium 250 MG TABS Take by mouth at bedtime.   psyllium (METAMUCIL) 58.6 % powder Take 1 packet by mouth daily as needed (fiber).   verapamil (CALAN-SR) 120 MG CR tablet Take 1 tablet (120 mg total) by mouth daily.   VITAMIN D, CHOLECALCIFEROL, PO Take 4,000 Units by mouth daily.   [DISCONTINUED] Coenzyme Q10 (CO Q-10) 200 MG CAPS Take by mouth daily.   No facility-administered medications prior to visit.    Review of Systems  HENT:  Negative for hearing loss.   Eyes:  Negative for blurred vision.  Respiratory:  Negative for shortness  of breath.   Cardiovascular:  Negative for chest pain.  Gastrointestinal: Negative.   Genitourinary: Negative.   Musculoskeletal:  Negative for back pain.  Neurological:  Negative for headaches.  Psychiatric/Behavioral:  Negative for depression.   All other systems reviewed and are negative.         Objective:     BP 108/70 (BP Location: Left Arm, Patient Position: Sitting, Cuff Size: Normal)   Pulse 70   Temp 98.3 F (36.8 C) (Oral)   Ht 5\' 2"  (1.575 m)   Wt 135 lb 11.2 oz (61.6 kg)   SpO2 98%   BMI 24.82 kg/m    Physical Exam Vitals reviewed.  Constitutional:      Appearance: Normal appearance. She is well-groomed and normal weight.   HENT:     Right Ear: Tympanic membrane and ear canal normal.     Left Ear: Tympanic membrane and ear canal normal.     Mouth/Throat:     Mouth: Mucous membranes are moist.     Pharynx: No posterior oropharyngeal erythema.  Eyes:     Conjunctiva/sclera: Conjunctivae normal.  Neck:     Thyroid: No thyromegaly.  Cardiovascular:     Rate and Rhythm: Normal rate and regular rhythm.     Pulses: Normal pulses.     Heart sounds: S1 normal and S2 normal.  Pulmonary:     Effort: Pulmonary effort is normal.     Breath sounds: Normal breath sounds and air entry.  Abdominal:     General: Bowel sounds are normal.  Musculoskeletal:     Right lower leg: No edema.     Left lower leg: No edema.  Neurological:     Mental Status: She is alert and oriented to person, place, and time. Mental status is at baseline.     Gait: Gait is intact.  Psychiatric:        Mood and Affect: Mood and affect normal.        Speech: Speech normal.        Behavior: Behavior normal.        Judgment: Judgment normal.      No results found for any visits on 12/21/22.     Assessment & Plan:    Routine Health Maintenance and Physical Exam  Immunization History  Administered Date(s) Administered   Fluad Quad(high Dose 65+) 05/22/2019, 05/27/2020, 06/07/2021, 06/24/2022   H1N1 09/09/2008   Influenza Split 07/02/2011   Influenza Whole 07/05/2010, 07/18/2012   Influenza, High Dose Seasonal PF 06/09/2015, 06/09/2016, 05/30/2017, 06/05/2018   Influenza,inj,Quad PF,6+ Mos 06/17/2013, 06/13/2014   Influenza,inj,quad, With Preservative 06/19/2017, 06/19/2018   PFIZER(Purple Top)SARS-COV-2 Vaccination 10/24/2019, 11/18/2019, 07/21/2020, 07/09/2021   PNEUMOCOCCAL CONJUGATE-20 06/07/2021   Pneumococcal Conjugate-13 07/29/2014   Pneumococcal Polysaccharide-23 07/22/2002   Tdap 11/30/2011   Zoster Recombinat (Shingrix) 01/21/2020, 02/24/2020   Zoster, Live 11/14/2006    Health Maintenance  Topic Date Due   DEXA  SCAN  12/15/2022   COVID-19 Vaccine (5 - 2023-24 season) 11/12/2024 (Originally 05/20/2022)   MAMMOGRAM  10/27/2028 (Originally 04/09/2022)   INFLUENZA VACCINE  04/20/2023   Medicare Annual Wellness (AWV)  10/28/2023   Pneumonia Vaccine 53+ Years old  Completed   Zoster Vaccines- Shingrix  Completed   HPV VACCINES  Aged Out   DTaP/Tdap/Td  Discontinued    Discussed health benefits of physical activity, and encouraged her to engage in regular exercise appropriate for her age and condition.  Problem List Items Addressed This Visit  Unprioritized   Hypothyroidism - Primary   Relevant Orders   TSH   Paroxysmal atrial fibrillation   Relevant Orders   CMP   B12 deficiency   Relevant Orders   Vitamin B12   Other Visit Diagnoses     Postmenopausal state       Relevant Orders   DG Bone Density   Routine general medical examination at a health care facility          Return in about 1 year (around 12/21/2023) for annual physical exam. Normal physical exam findings today. I have ordered her annual blood work, patient declined lipid screening due to history of statin myopathy. She is also due for her DEXA scan this year. Orders placed.     Farrel Conners, MD

## 2022-12-21 NOTE — Patient Instructions (Signed)

## 2022-12-26 ENCOUNTER — Ambulatory Visit (HOSPITAL_BASED_OUTPATIENT_CLINIC_OR_DEPARTMENT_OTHER): Admission: RE | Admit: 2022-12-26 | Payer: Medicare Other | Source: Ambulatory Visit

## 2022-12-28 ENCOUNTER — Ambulatory Visit (HOSPITAL_BASED_OUTPATIENT_CLINIC_OR_DEPARTMENT_OTHER)
Admission: RE | Admit: 2022-12-28 | Discharge: 2022-12-28 | Disposition: A | Discharge status: Home / Self Care | Payer: Medicare Other | Source: Ambulatory Visit | Attending: Family Medicine | Admitting: Family Medicine

## 2022-12-28 DIAGNOSIS — Z780 Asymptomatic menopausal state: Secondary | ICD-10-CM | POA: Diagnosis not present

## 2022-12-28 DIAGNOSIS — Z13820 Encounter for screening for osteoporosis: Secondary | ICD-10-CM | POA: Diagnosis not present

## 2022-12-29 ENCOUNTER — Encounter: Payer: Self-pay | Admitting: Family Medicine

## 2023-01-10 DIAGNOSIS — M25551 Pain in right hip: Secondary | ICD-10-CM | POA: Diagnosis not present

## 2023-03-15 DIAGNOSIS — M25551 Pain in right hip: Secondary | ICD-10-CM | POA: Diagnosis not present

## 2023-03-22 ENCOUNTER — Encounter: Payer: Self-pay | Admitting: Family Medicine

## 2023-03-22 DIAGNOSIS — R232 Flushing: Secondary | ICD-10-CM

## 2023-03-27 MED ORDER — ESTRADIOL 0.1 MG/24HR TD PTTW: 1.0000 | MEDICATED_PATCH | TRANSDERMAL | 1 refills | Status: DC

## 2023-04-13 ENCOUNTER — Encounter: Payer: Self-pay | Admitting: Family Medicine

## 2023-04-13 DIAGNOSIS — E039 Hypothyroidism, unspecified: Secondary | ICD-10-CM

## 2023-04-14 NOTE — Telephone Encounter (Signed)
Noted- ok to close.

## 2023-04-19 ENCOUNTER — Other Ambulatory Visit: Payer: Self-pay | Admitting: *Deleted

## 2023-04-19 MED ORDER — LEVOTHYROXINE SODIUM 88 MCG PO TABS: ORAL_TABLET | ORAL | 1 refills | Status: DC

## 2023-04-19 NOTE — Telephone Encounter (Signed)
Rx done. 

## 2023-04-19 NOTE — Telephone Encounter (Signed)
Pt called to F/U on this refill request, stating she called [facility] and they were unable to locate refill request.  Please advise.

## 2023-04-20 MED ORDER — LEVOTHYROXINE SODIUM 75 MCG PO TABS: ORAL_TABLET | ORAL | 3 refills | Status: DC

## 2023-06-10 DIAGNOSIS — K080 Exfoliation of teeth due to systemic causes: Secondary | ICD-10-CM | POA: Diagnosis not present

## 2023-06-23 ENCOUNTER — Ambulatory Visit: Payer: Medicare Other

## 2023-06-23 ENCOUNTER — Ambulatory Visit (INDEPENDENT_AMBULATORY_CARE_PROVIDER_SITE_OTHER): Payer: Medicare Other

## 2023-06-23 DIAGNOSIS — Z23 Encounter for immunization: Secondary | ICD-10-CM | POA: Diagnosis not present

## 2023-07-19 DIAGNOSIS — H11823 Conjunctivochalasis, bilateral: Secondary | ICD-10-CM | POA: Diagnosis not present

## 2023-07-19 DIAGNOSIS — H02834 Dermatochalasis of left upper eyelid: Secondary | ICD-10-CM | POA: Diagnosis not present

## 2023-07-19 DIAGNOSIS — H02831 Dermatochalasis of right upper eyelid: Secondary | ICD-10-CM | POA: Diagnosis not present

## 2023-07-19 DIAGNOSIS — H04123 Dry eye syndrome of bilateral lacrimal glands: Secondary | ICD-10-CM | POA: Diagnosis not present

## 2023-07-22 ENCOUNTER — Other Ambulatory Visit: Payer: Self-pay | Admitting: Student

## 2023-07-22 DIAGNOSIS — H5203 Hypermetropia, bilateral: Secondary | ICD-10-CM | POA: Diagnosis not present

## 2023-07-24 ENCOUNTER — Other Ambulatory Visit: Payer: Self-pay

## 2023-07-27 NOTE — Progress Notes (Signed)
  Electrophysiology Office Note:   Date:  07/28/2023  ID:  Marissa Hart, DOB [birth year], MRN [id number]  Primary Cardiologist: None Electrophysiologist: Will Jorja Loa, MD      History of Present Illness:   Marissa Hart is a 88 y.o. female with h/o SVT and AF s/p ablations for both seen today for routine electrophysiology followup.   Since last being seen in our clinic the patient reports doing very well. Has mild palpitations a couple times a week. Doesn't really monitor her BP at home, but when she does it varies.  she denies chest pain, dyspnea, PND, orthopnea, nausea, vomiting, dizziness, syncope, edema, weight gain, or early satiety.   Review of systems complete and found to be negative unless listed in HPI.   EP Information / Studies Reviewed:    EKG is ordered today. Personal review as below.  EKG Interpretation Date/Time:  Friday July 28 2023 07:47:39 EST Ventricular Rate:  78 PR Interval:  212 QRS Duration:  70 QT Interval:  388 QTC Calculation: 442 R Axis:   7  Text Interpretation: Sinus rhythm with 1st degree A-V block Confirmed by Maxine Glenn [phone]) on 07/28/2023 7:56:08 AM    Arrhythmia History  SVT 2018 PVI 2014 Thinks she had one earlier than that.   Physical Exam:   VS:  BP (!) 168/70   Pulse 78   Ht 5' 2.5" (1.588 m)   Wt 133 lb (60.3 kg)   SpO2 98%   BMI 23.94 kg/m    Wt Readings from Last 3 Encounters:  07/28/23 133 lb (60.3 kg)  12/21/22 135 lb 11.2 oz (61.6 kg)  10/27/22 136 lb (61.7 kg)     GEN: Well nourished, well developed in no acute distress NECK: No JVD; No carotid bruits CARDIAC: Regular rate and rhythm, no murmurs, rubs, gallops RESPIRATORY:  Clear to auscultation without rales, wheezing or rhonchi  ABDOMEN: Soft, non-tender, non-distended EXTREMITIES:  No edema; No deformity   ASSESSMENT AND PLAN:    Atrial tachycardia Multiple morphologies Several ablations Rare palpitations ILR previously removed 05/2021  from RRT.  PAF Well controlled Continue Savaysa for CHA2DS2VASc of at least 3.  HTN Recommended she start on Losartan but she doesn't want any additional drugs. She will monitor BP at home, if systolic > 140 more often than not for a week, recommended she call back for Losartan.  Follow up with Dr. Elberta Fortis in 12 months  Signed, Graciella Freer, PA-C

## 2023-07-28 ENCOUNTER — Ambulatory Visit: Payer: Medicare Other | Attending: Student | Admitting: Student

## 2023-07-28 ENCOUNTER — Encounter: Payer: Self-pay | Admitting: Student

## 2023-07-28 VITALS — BP 154/64 | HR 78 | Ht 62.5 in | Wt 133.0 lb

## 2023-07-28 DIAGNOSIS — I480 Paroxysmal atrial fibrillation: Secondary | ICD-10-CM | POA: Diagnosis not present

## 2023-07-28 DIAGNOSIS — I471 Supraventricular tachycardia, unspecified: Secondary | ICD-10-CM

## 2023-07-28 NOTE — Patient Instructions (Signed)
Medication Instructions:  Your physician recommends that you continue on your current medications as directed. Please refer to the Current Medication list given to you today.  *If you need a refill on your cardiac medications before your next appointment, please call your pharmacy*  Lab Work: None ordered If you have labs (blood work) drawn today and your tests are completely normal, you will receive your results only by: MyChart Message (if you have MyChart) OR A paper copy in the mail If you have any lab test that is abnormal or we need to change your treatment, we will call you to review the results.  Follow-Up: At [facility], you and your health needs are our priority.  As part of our continuing mission to provide you with exceptional heart care, we have created designated Provider Care Teams.  These Care Teams include your primary Cardiologist (physician) and Advanced Practice Providers (APPs -  Physician Assistants and Nurse Practitioners) who all work together to provide you with the care you need, when you need it.  Your next appointment:   1 year(s)  Provider:   Loman Brooklyn, MD

## 2023-07-30 DIAGNOSIS — Z79899 Other long term (current) drug therapy: Secondary | ICD-10-CM

## 2023-07-30 DIAGNOSIS — I480 Paroxysmal atrial fibrillation: Secondary | ICD-10-CM

## 2023-07-31 MED ORDER — LOSARTAN POTASSIUM 25 MG PO TABS: 25.0000 mg | ORAL_TABLET | Freq: Every day | ORAL | 3 refills | Status: DC

## 2023-08-12 ENCOUNTER — Encounter: Payer: Self-pay | Admitting: Family Medicine

## 2023-08-14 ENCOUNTER — Telehealth: Payer: Self-pay | Admitting: Family Medicine

## 2023-08-14 NOTE — Telephone Encounter (Signed)
Patient dropped off document Handicap Placard, to be filled out by provider. Patient requested to send it back via Call Patient to pick up within 7-days. Document is located in providers tray at front office.Please advise at Mobile [phone] (mobile)

## 2023-08-16 ENCOUNTER — Other Ambulatory Visit: Payer: Self-pay | Admitting: Student

## 2023-08-16 DIAGNOSIS — I480 Paroxysmal atrial fibrillation: Secondary | ICD-10-CM | POA: Diagnosis not present

## 2023-08-16 DIAGNOSIS — Z79899 Other long term (current) drug therapy: Secondary | ICD-10-CM | POA: Diagnosis not present

## 2023-08-16 NOTE — Telephone Encounter (Signed)
Spoke with the patient and informed her PCP completed the placard application, charged a $20 fee and this was left at the front desk for pick up.

## 2023-08-17 LAB — BASIC METABOLIC PANEL
BUN/Creatinine Ratio: 22 (ref 12–28)
BUN: 19 mg/dL (ref 8–27)
CO2: 26 mmol/L (ref 20–29)
Calcium: 9.4 mg/dL (ref 8.7–10.3)
Chloride: 99 mmol/L (ref 96–106)
Creatinine, Ser: 0.87 mg/dL (ref 0.57–1.00)
Glucose: 79 mg/dL (ref 70–99)
Potassium: 4.6 mmol/L (ref 3.5–5.2)
Sodium: 140 mmol/L (ref 134–144)
eGFR: 65 mL/min/{1.73_m2} (ref >59)

## 2023-10-14 ENCOUNTER — Other Ambulatory Visit: Payer: Self-pay | Admitting: Student

## 2023-10-14 ENCOUNTER — Other Ambulatory Visit: Payer: Self-pay | Admitting: Family Medicine

## 2024-01-18 DIAGNOSIS — H903 Sensorineural hearing loss, bilateral: Secondary | ICD-10-CM | POA: Diagnosis not present

## 2024-01-19 ENCOUNTER — Encounter: Payer: Self-pay | Admitting: Family Medicine

## 2024-01-24 ENCOUNTER — Encounter (HOSPITAL_COMMUNITY): Payer: Self-pay

## 2024-01-24 ENCOUNTER — Ambulatory Visit: Payer: Medicare Other

## 2024-01-24 VITALS — Ht 62.5 in | Wt 128.0 lb

## 2024-01-24 DIAGNOSIS — Z0000 Encounter for general adult medical examination without abnormal findings: Secondary | ICD-10-CM | POA: Diagnosis not present

## 2024-01-24 NOTE — Progress Notes (Signed)
 Subjective:   Marissa Hart is a 89 y.o. who presents for a Medicare Wellness preventive visit.  Visit Complete: Virtual I connected with  [name] on 01/24/24 by a audio enabled telemedicine application and verified that I am speaking with the correct person using two identifiers.  Patient Location: Home  Provider Location: Home Office  I discussed the limitations of evaluation and management by telemedicine. The patient expressed understanding and agreed to proceed.  Vital Signs: Because this visit was a virtual/telehealth visit, some criteria may be missing or patient reported. Any vitals not documented were not able to be obtained and vitals that have been documented are patient reported.    Persons Participating in Visit: Patient.  AWV Questionnaire: No: Patient Medicare AWV questionnaire was not completed prior to this visit.  Cardiac Risk Factors include: advanced age (>71men, >65 women)     Objective:    Today's Vitals   01/24/24 1506  Weight: 128 lb (58.1 kg)  Height: 5' 2.5" (1.588 m)   Body mass index is 23.04 kg/m.     01/24/2024    3:16 PM 05/16/2022    2:42 PM 10/26/2021    9:24 AM 12/22/2020   11:48 AM 10/21/2020    1:29 PM 06/14/2019   12:19 PM 06/10/2019   11:30 AM  Advanced Directives  Does Patient Have a Medical Advance Directive? Yes Yes Yes Yes Yes Yes Yes  Type of Estate agent of [place];Living will Healthcare Power of [place];Living will Healthcare Power of [place];Living will Living will;Healthcare Power of [employer] Power of [place];Living will Healthcare Power of [place];Out of facility DNR (pink MOST or yellow form) Healthcare Power of [place];Out of facility DNR (pink MOST or yellow form)  Does patient want to make changes to medical advance directive? No - Patient declined  No - Patient declined No - Patient declined No - Patient declined  No - Patient declined  Copy of Healthcare Power of Attorney in  Chart? Yes - validated most recent copy scanned in chart (See row information)  Yes - validated most recent copy scanned in chart (See row information) No - copy requested Yes - validated most recent copy scanned in chart (See row information) Yes - validated most recent copy scanned in chart (See row information) Yes - validated most recent copy scanned in chart (See row information)    Current Medications (verified) Outpatient Encounter Medications as of 01/24/2024  Medication Sig   Cyanocobalamin  (B-12) 5000 MCG SUBL Take 1/3 of dropperful by mouth once a day   docusate sodium  (COLACE) 100 MG capsule Take 100 mg by mouth daily as needed for mild constipation.   edoxaban  (SAVAYSA ) 60 MG TABS tablet Take 60 mg by mouth daily.   ELDERBERRY PO Take by mouth.   estradiol  (VIVELLE -DOT) 0.1 MG/24HR patch Place 1 patch (0.1 mg total) onto the skin 2 (two) times a week. Wed and Sat   Hypromellose (ARTIFICIAL TEARS OP) Apply 1 drop to eye daily as needed (dry eyes).   levothyroxine  (SYNTHROID ) 75 MCG tablet Take 1 tablet PO daily on Tuesday and Thursday. (On M, W, F, Sat, Sun will take )   levothyroxine  (SYNTHROID ) 88 MCG tablet TAKE 1 TABLET BY MOUTH ONCE DAILY ON MONDAYS, WEDNESDAYS, FRIDAYS, SATURDAYS, AND SUNDAYS.(TAKE 75 MCG ON TUESDAYS AND THURSDAYS)   losartan  (COZAAR ) 25 MG tablet Take 1 tablet (25 mg total) by mouth at bedtime.   LUTEIN PO Take by mouth daily.   Magnesium 250 MG TABS  Take by mouth at bedtime.   psyllium (METAMUCIL) 58.6 % powder Take 1 packet by mouth daily as needed (fiber).   verapamil  (CALAN -SR) 120 MG CR tablet Take 1 tablet by mouth once daily   VITAMIN D, CHOLECALCIFEROL, PO Take 4,000 Units by mouth daily.   No facility-administered encounter medications on file as of 01/24/2024.    Allergies (verified) Propafenone , Codeine, Tramadol hcl, Pravastatin, and Zocor [simvastatin]   History: Past Medical History:  Diagnosis Date   Acquired renal cyst of right kidney     Anxiety    Atrial fibrillation and flutter (HCC)    s/p PVI and CTI by Dr Nunzio Belch 04/2013   Blood transfusion    Cataract    CHEST WALL PAIN, HX OF 07/28/2007   Qualifier: Diagnosis of  By: Avis Boehringer CMA, [name]     Diverticulosis of colon    DJD (degenerative joint disease) 10/18/2011   Erysipelas    Fibromyalgia    FIBROMYALGIA 07/28/2007   Qualifier: Diagnosis of  By: Avis Boehringer CMA, [name]     GERD (gastroesophageal reflux disease)    Hiatal hernia    Hot flashes - on low dose HRT with gynecologist, Dr. Andree Kayser 03/31/2014   Hx of colonic polyps    Hyperlipidemia    MITRAL VALVE PROLAPSE 07/28/2007   Qualifier: Diagnosis of  By: Avis Boehringer CMA, [name]     PONV (postoperative nausea and vomiting)    Supraventricular tachycardia (HCC)    AV nodal reentry  s/p RFCA 2001   Thyroid  disease    Past Surgical History:  Procedure Laterality Date   ABDOMINAL HYSTERECTOMY     heavy menses, prolonged periods. no cancer. cervix remained.    ATRIAL FIBRILLATION ABLATION N/A 04/30/2013   Procedure: ATRIAL FIBRILLATION ABLATION;  Surgeon: Jolly Needle, MD;  [facility];  Service: Cardiovascular;  Laterality: N/A;PVI and CTI by Dr Nunzio Belch    BREAST BIOPSY Left    benign   BREAST BIOPSY  2011   benign   CATARACT EXTRACTION BILATERAL W/ ANTERIOR VITRECTOMY     COLONOSCOPY     ESOPHAGOGASTRODUODENOSCOPY     implantable loop recorder removal     KNEE ARTHROSCOPY Right 2012   KNEE SURGERY Right 08/22/2018   LOOP RECORDER INSERTION N/A 05/24/2017   Procedure: LOOP RECORDER INSERTION;  Surgeon: Jolly Needle, MD;  Location: [facility] INVASIVE CV LAB;  Service: Cardiovascular;  Laterality: N/A;   OOPHORECTOMY     done for cyst on ovaries.    ROTATOR CUFF REPAIR  08/2001   Dr. Dante Dyer   SVT ABLATION N/A 07/20/2017   Procedure: SVT ABLATION;  Surgeon: Jolly Needle, MD;  Location: [facility] INVASIVE CV LAB;  Service: Cardiovascular;  Laterality: N/A;   TEE WITHOUT CARDIOVERSION N/A 04/29/2013   Procedure:  TRANSESOPHAGEAL ECHOCARDIOGRAM (TEE);  Surgeon: Lucendia Rusk, MD;  Location: [facility] ENDOSCOPY;  Service: Cardiovascular;  Laterality: N/A;   TOTAL KNEE ARTHROPLASTY Right 06/10/2019   Procedure: TOTAL KNEE ARTHROPLASTY;  Surgeon: Liliane Rei, MD;  Location: [facility] ORS;  Service: Orthopedics;  Laterality: Right;   UPPER GASTROINTESTINAL ENDOSCOPY     Family History  Problem Relation Age of Onset   Heart failure Mother        died suddenly with this   Parkinson's disease Father    Diabetes Brother    Healthy Daughter    Social History   Socioeconomic History   Marital status: Widowed    Spouse name: Not on file   Number of children: 1   Years  of education: Not on file   Highest education level: 12th grade  Occupational History    Employer: RETIRED  Tobacco Use   Smoking status: Former    Current packs/day: 0.00    Average packs/day: 0.5 packs/day for 3.0 years (1.5 ttl pk-yrs)    Types: Cigarettes    Start date: 09/20/1963    Quit date: 09/19/1966    Years since quitting: 57.3   Smokeless tobacco: Never  Substance and Sexual Activity   Alcohol use: Yes    Alcohol/week: 3.0 standard drinks of alcohol    Types: 3 Glasses of wine per week    Comment: wine weekly   Drug use: No   Sexual activity: Not on file  Other Topics Concern   Not on file  Social History Narrative   Work or School: none      Home Situation: lives alone, widowed      Spiritual Beliefs: Christian      Lifestyle: cycles on stationary bike, calisthenics, active with church, family, friends         11-Oct-2017   Spouse died 3 years ago at 54 and dx with liver cancer    Children 1 dtr    2 grands dtr   5 great grands; 5 and 6 and the oldest one 67    Adopted a son from [place]   Children spoke [place] and [place];    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    Social Drivers of Corporate investment banker Strain: Low Risk  (01/24/2024)   Overall Financial Resource Strain (CARDIA)     Difficulty of Paying Living Expenses: Not hard at all  Food Insecurity: No Food Insecurity (01/24/2024)   Hunger Vital Sign    Worried About Running Out of Food in the Last Year: Never true    Ran Out of Food in the Last Year: Never true  Transportation Needs: No Transportation Needs (01/24/2024)   PRAPARE - Administrator, Civil Service (Medical): No    Lack of Transportation (Non-Medical): No  Physical Activity: Insufficiently Active (01/24/2024)   Exercise Vital Sign    Days of Exercise per Week: 7 days    Minutes of Exercise per Session: 10 min  Stress: No Stress Concern Present (01/24/2024)   [employer] of Occupational Health - Occupational Stress Questionnaire    Feeling of Stress : Not at all  Social Connections: Moderately Integrated (01/24/2024)   Social Connection and Isolation Panel [NHANES]    Frequency of Communication with Friends and Family: More than three times a week    Frequency of Social Gatherings with Friends and Family: More than three times a week    Attends Religious Services: More than 4 times per year    Active Member of [employer] or Organizations: Yes    Attends Banker Meetings: More than 4 times per year    Marital Status: Widowed    Tobacco Counseling Counseling given: Not Answered    Clinical Intake:  Pre-visit preparation completed: Yes  Pain : No/denies pain     BMI - recorded: 23.04 Nutritional Status: BMI of 19-24  Normal Nutritional Risks: None Diabetes: No  Lab Results  Component Value Date   HGBA1C 5.0 06/09/2015     How often do you need to have someone help you when you read instructions, pamphlets, or other written materials from your doctor or pharmacy?: 1 - Never  Interpreter Needed?: No  Information entered by ::  Farris Hong LPN   Activities of Daily Living     01/24/2024    3:13 PM  In your present state of health, do you have any difficulty performing the following activities:  Hearing? 1   Comment Wears Hearing Aids  Vision? 0  Difficulty concentrating or making decisions? 0  Walking or climbing stairs? 0  Dressing or bathing? 0  Doing errands, shopping? 0  Preparing Food and eating ? N  Using the Toilet? N  In the past six months, have you accidently leaked urine? N  Do you have problems with loss of bowel control? N  Managing your Medications? N  Managing your Finances? N  Housekeeping or managing your Housekeeping? N    Patient Care Team: Aida House, MD as PCP - General (Family Medicine) Lei Pump, MD as PCP - Electrophysiology (Cardiology) Leontine Rana, MD (Inactive) as Consulting Physician (Obstetrics and Gynecology) Jolly Needle, MD (Inactive) as Consulting Physician (Cardiology) Liliane Rei, MD as Consulting Physician (Orthopedic Surgery)  Indicate any recent Medical Services you may have received from other than [facility] providers in the past year (date may be approximate).     Assessment:   This is a routine wellness examination for Dave.  Hearing/Vision screen Hearing Screening - Comments:: . Wears Hearing Aids Vision Screening - Comments:: Wears rx glasses - up to date with routine eye exams with  Dr Candi Chafe   Goals Addressed               This Visit's Progress     Increase physical activity (pt-stated)        Remain active.       Depression Screen     01/24/2024    3:10 PM 10/27/2022   10:15 AM 10/27/2022   10:14 AM 10/26/2021    9:19 AM 10/21/2020    1:37 PM 11/25/2019    9:21 AM 10/08/2019   10:33 AM  PHQ 2/9 Scores  PHQ - 2 Score 0 0 0 0 0 0 0  PHQ- 9 Score  0         Fall Risk     01/24/2024    3:15 PM 12/21/2022   10:22 AM 10/27/2022   10:12 AM 12/06/2021   11:10 AM 10/26/2021    9:24 AM  Fall Risk   Falls in the past year? 0 1 1 0 0  Number falls in past yr: 0 0 0 0 0  Injury with Fall? 0 1 1 0 0  Comment   slight scrap on arm    Risk for fall due to : No Fall Risks No Fall Risks  No Fall Risks No Fall Risks   Follow up Falls prevention discussed;Falls evaluation completed Falls evaluation completed  Falls evaluation completed     MEDICARE RISK AT HOME:  Medicare Risk at Home Any stairs in or around the home?: No If so, are there any without handrails?: No Home free of loose throw rugs in walkways, pet beds, electrical cords, etc?: Yes Adequate lighting in your home to reduce risk of falls?: Yes Life alert?: No Use of a cane, walker or w/c?: No Grab bars in the bathroom?: Yes Shower chair or bench in shower?: Yes Elevated toilet seat or a handicapped toilet?: No  TIMED UP AND GO:  Was the test performed?  No  Cognitive Function: 6CIT completed    09/29/2017    3:01 PM  MMSE - Mini Mental State Exam  Not completed: --  01/24/2024    3:16 PM 10/26/2021    9:25 AM  6CIT Screen  What Year? 0 points 0 points  What month? 0 points 0 points  What time? 0 points 0 points  Count back from 20 0 points 0 points  Months in reverse 0 points 0 points  Repeat phrase 0 points 0 points  Total Score 0 points 0 points    Immunizations Immunization History  Administered Date(s) Administered   Fluad Quad(high Dose 65+) 05/22/2019, 05/27/2020, 06/07/2021, 06/24/2022   Fluad Trivalent(High Dose 65+) 06/23/2023   H1N1 09/09/2008   Influenza Split 07/02/2011   Influenza Whole 07/05/2010, 07/18/2012   Influenza, High Dose Seasonal PF 06/09/2015, 06/09/2016, 05/30/2017, 06/05/2018   Influenza,inj,Quad PF,6+ Mos 06/17/2013, 06/13/2014   Influenza,inj,quad, With Preservative 06/19/2017, 06/19/2018   PFIZER(Purple Top)SARS-COV-2 Vaccination 10/24/2019, 11/18/2019, 07/21/2020, 07/09/2021   PNEUMOCOCCAL CONJUGATE-20 06/07/2021   Pneumococcal Conjugate-13 07/29/2014   Pneumococcal Polysaccharide-23 07/22/2002   Tdap 11/30/2011   Zoster Recombinant(Shingrix) 01/21/2020, 02/24/2020   Zoster, Live 11/14/2006    Screening Tests Health Maintenance  Topic Date Due   COVID-19 Vaccine (5 -  2024-25 season) 05/21/2023   MAMMOGRAM  10/27/2028 (Originally 04/09/2022)   INFLUENZA VACCINE  04/19/2024   Medicare Annual Wellness (AWV)  01/23/2025   DEXA SCAN  12/28/2027   Pneumonia Vaccine 75+ Years old  Completed   Zoster Vaccines- Shingrix  Completed   HPV VACCINES  Aged Out   Meningococcal B Vaccine  Aged Out   DTaP/Tdap/Td  Discontinued    Health Maintenance  Health Maintenance Due  Topic Date Due   COVID-19 Vaccine (5 - 2024-25 season) 05/21/2023   Health Maintenance Items Addressed:   Additional Screening:  Vision Screening: Recommended annual ophthalmology exams for early detection of glaucoma and other disorders of the eye.  Dental Screening: Recommended annual dental exams for proper oral hygiene  Community Resource Referral / Chronic Care Management: CRR required this visit?  No   CCM required this visit?  No     Plan:     I have personally reviewed and noted the following in the patient's chart:   Medical and social history Use of alcohol, tobacco or illicit drugs  Current medications and supplements including opioid prescriptions. Patient is not currently taking opioid prescriptions. Functional ability and status Nutritional status Physical activity Advanced directives List of other physicians Hospitalizations, surgeries, and ER visits in previous 12 months Vitals Screenings to include cognitive, depression, and falls Referrals and appointments  In addition, I have reviewed and discussed with patient certain preventive protocols, quality metrics, and best practice recommendations. A written personalized care plan for preventive services as well as general preventive health recommendations were provided to patient.     Dewayne Ford, LPN   12/22/4096   After Visit Summary: (MyChart) Due to this being a telephonic visit, the after visit summary with patients personalized plan was offered to patient via MyChart   Notes: Nothing significant  to report at this time.

## 2024-01-24 NOTE — Patient Instructions (Addendum)
 Ms. Percifield , Thank you for taking time to come for your Medicare Wellness Visit. I appreciate your ongoing commitment to your health goals. Please review the following plan we discussed and let me know if I can assist you in the future.   Referrals/Orders/Follow-Ups/Clinician Recommendations:   This is a list of the screening recommended for you and due dates:  Health Maintenance  Topic Date Due   COVID-19 Vaccine (5 - 2024-25 season) 05/21/2023   Mammogram  10/27/2028*   Flu Shot  04/19/2024   Medicare Annual Wellness Visit  01/23/2025   DEXA scan (bone density measurement)  12/28/2027   Pneumonia Vaccine  Completed   Zoster (Shingles) Vaccine  Completed   HPV Vaccine  Aged Out   Meningitis B Vaccine  Aged Out   DTaP/Tdap/Td vaccine  Discontinued  *Topic was postponed. The date shown is not the original due date.    Advanced directives: (In Chart) A copy of your advanced directives are scanned into your chart should your provider ever need it.  Next Medicare Annual Wellness Visit scheduled for next year: Yes

## 2024-01-31 ENCOUNTER — Ambulatory Visit (INDEPENDENT_AMBULATORY_CARE_PROVIDER_SITE_OTHER): Payer: Medicare Other | Admitting: Family Medicine

## 2024-01-31 ENCOUNTER — Encounter: Payer: Self-pay | Admitting: Family Medicine

## 2024-01-31 VITALS — BP 139/71 | HR 88 | Temp 98.2°F | Ht 62.75 in | Wt 130.1 lb

## 2024-01-31 DIAGNOSIS — Z0000 Encounter for general adult medical examination without abnormal findings: Secondary | ICD-10-CM

## 2024-01-31 DIAGNOSIS — E039 Hypothyroidism, unspecified: Secondary | ICD-10-CM

## 2024-01-31 DIAGNOSIS — Z131 Encounter for screening for diabetes mellitus: Secondary | ICD-10-CM

## 2024-01-31 DIAGNOSIS — I10 Essential (primary) hypertension: Secondary | ICD-10-CM | POA: Diagnosis not present

## 2024-01-31 LAB — COMPREHENSIVE METABOLIC PANEL WITH GFR
ALT: 10 U/L (ref 0–35)
AST: 17 U/L (ref 0–37)
Albumin: 4.3 g/dL (ref 3.5–5.2)
Alkaline Phosphatase: 95 U/L (ref 39–117)
BUN: 16 mg/dL (ref 6–23)
CO2: 30 meq/L (ref 19–32)
Calcium: 9.2 mg/dL (ref 8.4–10.5)
Chloride: 99 meq/L (ref 96–112)
Creatinine, Ser: 0.79 mg/dL (ref 0.40–1.20)
GFR: 67.39 mL/min (ref >60.00)
Glucose, Bld: 82 mg/dL (ref 70–99)
Potassium: 4.1 meq/L (ref 3.5–5.1)
Sodium: 139 meq/L (ref 135–145)
Total Bilirubin: 0.5 mg/dL (ref 0.2–1.2)
Total Protein: 6.4 g/dL (ref 6.0–8.3)

## 2024-01-31 LAB — LIPID PANEL
Cholesterol: 233 mg/dL — ABNORMAL HIGH (ref 0–200)
HDL: 48.2 mg/dL (ref >39.00)
LDL Cholesterol: 156 mg/dL — ABNORMAL HIGH (ref 0–99)
NonHDL: 185.02
Total CHOL/HDL Ratio: 5
Triglycerides: 144 mg/dL (ref 0.0–149.0)
VLDL: 28.8 mg/dL (ref 0.0–40.0)

## 2024-01-31 LAB — HEMOGLOBIN A1C: Hgb A1c MFr Bld: 5.2 % (ref 4.6–6.5)

## 2024-01-31 LAB — TSH: TSH: 2.08 u[IU]/mL (ref 0.35–5.50)

## 2024-01-31 NOTE — Patient Instructions (Signed)

## 2024-01-31 NOTE — Progress Notes (Signed)
 Complete physical exam  Patient: Marissa Hart   DOB: 12-24-1934   89 y.o. Female  MRN: [id number]  Subjective:     Chief Complaint  Patient presents with   Annual Exam    Marissa Hart is a 89 y.o. female who presents today for a complete physical exam. She reports consuming a general and pt eats a lot of vegetables, eat good sources of protein diet. Home exercise routine includes walking 2 hrs per week and stretching exercises also. She generally feels well. She reports sleeping well. She does not have additional problems to discuss today.    Most recent fall risk assessment:    01/24/2024    3:15 PM  Fall Risk   Falls in the past year? 0  Number falls in past yr: 0  Injury with Fall? 0  Risk for fall due to : No Fall Risks  Follow up Falls prevention discussed;Falls evaluation completed     Most recent depression screenings:    01/24/2024    3:10 PM 10/27/2022   10:15 AM  PHQ 2/9 Scores  PHQ - 2 Score 0 0  PHQ- 9 Score  0    Vision:Within last year and sees Dr. Candi Chafe and Dental: No current dental problems and Receives regular dental care  Patient Active Problem List   Diagnosis Date Noted   B12 deficiency 05/27/2020   Myalgia due to statin 11/26/2019   OA (osteoarthritis) of knee 06/10/2019   Tachyarrhythmia 02/14/2018   Left hip pain 03/28/2016   Paroxysmal atrial fibrillation (HCC) 03/31/2014   Hot flashes - on low dose HRT with gynecologist, Dr. Andree Kayser 03/31/2014   Hypothyroidism 07/28/2007      Patient Care Team: Aida House, MD as PCP - General (Family Medicine) Lei Pump, MD as PCP - Electrophysiology (Cardiology) Leontine Rana, MD (Inactive) as Consulting Physician (Obstetrics and Gynecology) Jolly Needle, MD (Inactive) as Consulting Physician (Cardiology) Liliane Rei, MD as Consulting Physician (Orthopedic Surgery)   Outpatient Medications Prior to Visit  Medication Sig   Cyanocobalamin  (B-12) 5000 MCG SUBL Take 1/3 of  dropperful by mouth once a day   docusate sodium  (COLACE) 100 MG capsule Take 100 mg by mouth daily as needed for mild constipation.   edoxaban  (SAVAYSA ) 60 MG TABS tablet Take 60 mg by mouth daily.   ELDERBERRY PO Take by mouth.   estradiol  (VIVELLE -DOT) 0.1 MG/24HR patch Place 1 patch (0.1 mg total) onto the skin 2 (two) times a week. Wed and Sat   Hypromellose (ARTIFICIAL TEARS OP) Apply 1 drop to eye daily as needed (dry eyes).   levothyroxine  (SYNTHROID ) 75 MCG tablet Take 1 tablet PO daily on Tuesday and Thursday. (On M, W, F, Sat, Sun will take )   levothyroxine  (SYNTHROID ) 88 MCG tablet TAKE 1 TABLET BY MOUTH ONCE DAILY ON MONDAYS, WEDNESDAYS, FRIDAYS, SATURDAYS, AND SUNDAYS.(TAKE 75 MCG ON TUESDAYS AND THURSDAYS)   losartan  (COZAAR ) 25 MG tablet Take 1 tablet (25 mg total) by mouth at bedtime.   LUTEIN PO Take by mouth daily.   Magnesium 250 MG TABS Take by mouth at bedtime.   psyllium (METAMUCIL) 58.6 % powder Take 1 packet by mouth daily as needed (fiber).   verapamil  (CALAN -SR) 120 MG CR tablet Take 1 tablet by mouth once daily   VITAMIN D, CHOLECALCIFEROL, PO Take 4,000 Units by mouth daily.   No facility-administered medications prior to visit.    Review of Systems  HENT:  Negative for hearing loss.  Eyes:  Negative for blurred vision.  Respiratory:  Negative for shortness of breath.   Cardiovascular:  Negative for chest pain.  Gastrointestinal: Negative.   Genitourinary: Negative.   Musculoskeletal:  Negative for back pain.  Neurological:  Negative for headaches.  Psychiatric/Behavioral:  Negative for depression.        Objective:     BP 139/71 (BP Location: Right Arm, Patient Position: Sitting, Cuff Size: Normal)   Pulse 88   Temp 98.2 F (36.8 C) (Oral)   Ht 5' 2.75" (1.594 m)   Wt 130 lb 1.6 oz (59 kg)   SpO2 99%   BMI 23.23 kg/m    Physical Exam Vitals reviewed.  Constitutional:      Appearance: Normal appearance. She is well-groomed and normal  weight.  HENT:     Right Ear: Tympanic membrane and ear canal normal.     Left Ear: Tympanic membrane and ear canal normal.     Mouth/Throat:     Mouth: Mucous membranes are moist.     Pharynx: No posterior oropharyngeal erythema.  Eyes:     Conjunctiva/sclera: Conjunctivae normal.  Neck:     Thyroid : No thyromegaly.  Cardiovascular:     Rate and Rhythm: Normal rate and regular rhythm.     Pulses: Normal pulses.     Heart sounds: S1 normal and S2 normal.  Pulmonary:     Effort: Pulmonary effort is normal.     Breath sounds: Normal breath sounds and air entry.  Abdominal:     General: Abdomen is flat. Bowel sounds are normal.     Palpations: Abdomen is soft.  Musculoskeletal:     Right lower leg: No edema.     Left lower leg: No edema.  Lymphadenopathy:     Cervical: No cervical adenopathy.  Neurological:     Mental Status: She is alert and oriented to person, place, and time. Mental status is at baseline.     Gait: Gait is intact.  Psychiatric:        Mood and Affect: Mood and affect normal.        Speech: Speech normal.        Behavior: Behavior normal.        Judgment: Judgment normal.      No results found for any visits on 01/31/24.     Assessment & Plan:    Routine Health Maintenance and Physical Exam  Immunization History  Administered Date(s) Administered   Fluad Quad(high Dose 65+) 05/22/2019, 05/27/2020, 06/07/2021, 06/24/2022   Fluad Trivalent(High Dose 65+) 06/23/2023   H1N1 09/09/2008   Influenza Split 07/02/2011   Influenza Whole 07/05/2010, 07/18/2012   Influenza, High Dose Seasonal PF 06/09/2015, 06/09/2016, 05/30/2017, 06/05/2018   Influenza,inj,Quad PF,6+ Mos 06/17/2013, 06/13/2014   Influenza,inj,quad, With Preservative 06/19/2017, 06/19/2018   PFIZER(Purple Top)SARS-COV-2 Vaccination 10/24/2019, 11/18/2019, 07/21/2020, 07/09/2021   PNEUMOCOCCAL CONJUGATE-20 06/07/2021   Pneumococcal Conjugate-13 07/29/2014   Pneumococcal Polysaccharide-23  07/22/2002   Tdap 11/30/2011   Zoster Recombinant(Shingrix) 01/21/2020, 02/24/2020   Zoster, Live 11/14/2006    Health Maintenance  Topic Date Due   COVID-19 Vaccine (5 - 2024-25 season) 05/21/2023   MAMMOGRAM  10/27/2028 (Originally 04/09/2022)   INFLUENZA VACCINE  04/19/2024   Medicare Annual Wellness (AWV)  01/23/2025   DEXA SCAN  12/28/2027   Pneumonia Vaccine 20+ Years old  Completed   Zoster Vaccines- Shingrix  Completed   HPV VACCINES  Aged Out   Meningococcal B Vaccine  Aged Out   DTaP/Tdap/Td  Discontinued  Discussed health benefits of physical activity, and encouraged her to engage in regular exercise appropriate for her age and condition.  Primary hypertension -     Lipid panel; Future -     Comprehensive metabolic panel with GFR; Future  Hypothyroidism, unspecified type -     TSH; Future  Diabetes mellitus screening -     Hemoglobin A1c; Future  Routine general medical examination at a health care facility   Normal physical exam findings today. I reviewed her immunizations, health maintenance, she is UTD on her screenings. Labs ordered for annual surveillance. I rechecked her BP and it is relatively well controlled. I counseled the patient on increasing exercise, healthy sleep habits. Handouts given.   Return in about 1 year (around 01/30/2025) for annual physical exam.     Aida House, MD

## 2024-02-01 DIAGNOSIS — K080 Exfoliation of teeth due to systemic causes: Secondary | ICD-10-CM | POA: Diagnosis not present

## 2024-02-06 ENCOUNTER — Ambulatory Visit: Payer: Self-pay | Admitting: Family Medicine

## 2024-04-11 DIAGNOSIS — K080 Exfoliation of teeth due to systemic causes: Secondary | ICD-10-CM | POA: Diagnosis not present

## 2024-05-05 ENCOUNTER — Other Ambulatory Visit: Payer: Self-pay | Admitting: Family Medicine

## 2024-05-05 DIAGNOSIS — R232 Flushing: Secondary | ICD-10-CM

## 2024-05-15 ENCOUNTER — Other Ambulatory Visit: Payer: Self-pay

## 2024-05-15 ENCOUNTER — Emergency Department (HOSPITAL_COMMUNITY)

## 2024-05-15 ENCOUNTER — Emergency Department (HOSPITAL_COMMUNITY)
Admission: EM | Admit: 2024-05-15 | Discharge: 2024-05-16 | Disposition: A | Discharge status: Home / Self Care | Attending: Emergency Medicine | Admitting: Emergency Medicine

## 2024-05-15 DIAGNOSIS — R9389 Abnormal findings on diagnostic imaging of other specified body structures: Secondary | ICD-10-CM | POA: Diagnosis not present

## 2024-05-15 DIAGNOSIS — S199XXA Unspecified injury of neck, initial encounter: Secondary | ICD-10-CM | POA: Diagnosis not present

## 2024-05-15 DIAGNOSIS — M79641 Pain in right hand: Secondary | ICD-10-CM | POA: Diagnosis not present

## 2024-05-15 DIAGNOSIS — Z23 Encounter for immunization: Secondary | ICD-10-CM | POA: Insufficient documentation

## 2024-05-15 DIAGNOSIS — M25561 Pain in right knee: Secondary | ICD-10-CM | POA: Diagnosis not present

## 2024-05-15 DIAGNOSIS — S3993XA Unspecified injury of pelvis, initial encounter: Secondary | ICD-10-CM | POA: Diagnosis not present

## 2024-05-15 DIAGNOSIS — R519 Headache, unspecified: Secondary | ICD-10-CM | POA: Diagnosis not present

## 2024-05-15 DIAGNOSIS — Z471 Aftercare following joint replacement surgery: Secondary | ICD-10-CM | POA: Diagnosis not present

## 2024-05-15 DIAGNOSIS — Z79899 Other long term (current) drug therapy: Secondary | ICD-10-CM | POA: Insufficient documentation

## 2024-05-15 DIAGNOSIS — S20319A Abrasion of unspecified front wall of thorax, initial encounter: Secondary | ICD-10-CM | POA: Diagnosis not present

## 2024-05-15 DIAGNOSIS — I700 Atherosclerosis of aorta: Secondary | ICD-10-CM | POA: Diagnosis not present

## 2024-05-15 DIAGNOSIS — R079 Chest pain, unspecified: Secondary | ICD-10-CM | POA: Insufficient documentation

## 2024-05-15 DIAGNOSIS — M1811 Unilateral primary osteoarthritis of first carpometacarpal joint, right hand: Secondary | ICD-10-CM | POA: Diagnosis not present

## 2024-05-15 DIAGNOSIS — Z96651 Presence of right artificial knee joint: Secondary | ICD-10-CM | POA: Diagnosis not present

## 2024-05-15 DIAGNOSIS — S3991XA Unspecified injury of abdomen, initial encounter: Secondary | ICD-10-CM | POA: Insufficient documentation

## 2024-05-15 DIAGNOSIS — I10 Essential (primary) hypertension: Secondary | ICD-10-CM | POA: Insufficient documentation

## 2024-05-15 DIAGNOSIS — M25511 Pain in right shoulder: Secondary | ICD-10-CM | POA: Insufficient documentation

## 2024-05-15 DIAGNOSIS — S8001XA Contusion of right knee, initial encounter: Secondary | ICD-10-CM | POA: Insufficient documentation

## 2024-05-15 DIAGNOSIS — R739 Hyperglycemia, unspecified: Secondary | ICD-10-CM

## 2024-05-15 DIAGNOSIS — S299XXA Unspecified injury of thorax, initial encounter: Secondary | ICD-10-CM | POA: Diagnosis not present

## 2024-05-15 DIAGNOSIS — S8991XA Unspecified injury of right lower leg, initial encounter: Secondary | ICD-10-CM | POA: Diagnosis not present

## 2024-05-15 DIAGNOSIS — Y92410 Unspecified street and highway as the place of occurrence of the external cause: Secondary | ICD-10-CM | POA: Diagnosis not present

## 2024-05-15 DIAGNOSIS — M542 Cervicalgia: Secondary | ICD-10-CM | POA: Insufficient documentation

## 2024-05-15 DIAGNOSIS — S0990XA Unspecified injury of head, initial encounter: Secondary | ICD-10-CM | POA: Diagnosis not present

## 2024-05-15 DIAGNOSIS — I6523 Occlusion and stenosis of bilateral carotid arteries: Secondary | ICD-10-CM | POA: Diagnosis not present

## 2024-05-15 DIAGNOSIS — M19011 Primary osteoarthritis, right shoulder: Secondary | ICD-10-CM | POA: Diagnosis not present

## 2024-05-15 LAB — CBC WITH DIFFERENTIAL/PLATELET
Abs Immature Granulocytes: 0.16 K/uL — ABNORMAL HIGH (ref 0.00–0.07)
Basophils Absolute: 0.1 K/uL (ref 0.0–0.1)
Basophils Relative: 0 %
Eosinophils Absolute: 0.2 K/uL (ref 0.0–0.5)
Eosinophils Relative: 2 %
HCT: 42.8 % (ref 36.0–46.0)
Hemoglobin: 14.7 g/dL (ref 12.0–15.0)
Immature Granulocytes: 1 %
Lymphocytes Relative: 10 %
Lymphs Abs: 1.5 K/uL (ref 0.7–4.0)
MCH: 34 pg (ref 26.0–34.0)
MCHC: 34.3 g/dL (ref 30.0–36.0)
MCV: 99.1 fL (ref 80.0–100.0)
Monocytes Absolute: 1 K/uL (ref 0.1–1.0)
Monocytes Relative: 6 %
Neutro Abs: 13 K/uL — ABNORMAL HIGH (ref 1.7–7.7)
Neutrophils Relative %: 81 %
Platelets: 241 K/uL (ref 150–400)
RBC: 4.32 MIL/uL (ref 3.87–5.11)
RDW: 12.6 % (ref 11.5–15.5)
WBC: 16 K/uL — ABNORMAL HIGH (ref 4.0–10.5)
nRBC: 0 % (ref 0.0–0.2)

## 2024-05-15 LAB — I-STAT CHEM 8, ED
BUN: 19 mg/dL (ref 8–23)
Calcium, Ion: 1.05 mmol/L — ABNORMAL LOW (ref 1.15–1.40)
Chloride: 100 mmol/L (ref 98–111)
Creatinine, Ser: 0.9 mg/dL (ref 0.44–1.00)
Glucose, Bld: 121 mg/dL — ABNORMAL HIGH (ref 70–99)
HCT: 43 % (ref 36.0–46.0)
Hemoglobin: 14.6 g/dL (ref 12.0–15.0)
Potassium: 3.6 mmol/L (ref 3.5–5.1)
Sodium: 138 mmol/L (ref 135–145)
TCO2: 27 mmol/L (ref 22–32)

## 2024-05-15 MED ORDER — TETANUS-DIPHTH-ACELL PERTUSSIS 5-2.5-18.5 LF-MCG/0.5 IM SUSY
0.5000 mL | PREFILLED_SYRINGE | Freq: Once | INTRAMUSCULAR | Status: AC
  Administered 2024-05-16: 0.5 mL via INTRAMUSCULAR
  Filled 2024-05-15: qty 0.5

## 2024-05-15 MED ORDER — IOHEXOL 350 MG/ML SOLN
75.0000 mL | Freq: Once | INTRAVENOUS | Status: AC | PRN
  Administered 2024-05-15: 75 mL via INTRAVENOUS

## 2024-05-15 NOTE — ED Notes (Signed)
 Nt called ccmd@11 :04pm
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
## 2024-05-16 NOTE — ED Notes (Signed)
 Skin tear on chest and R hand cleaned and dressed with Vaseline guaze, guaze, and wrapped.

## 2024-06-23 ENCOUNTER — Other Ambulatory Visit: Payer: Self-pay | Admitting: Family Medicine

## 2024-06-24 ENCOUNTER — Other Ambulatory Visit: Payer: Self-pay | Admitting: Family Medicine

## 2024-06-24 NOTE — Telephone Encounter (Unsigned)
 Copied from CRM [id number]. Topic: Clinical - Medication Refill >> Jun 24, 2024  9:07 AM Wess RAMAN wrote: Medication: levothyroxine  (SYNTHROID ) 88 MCG tablet   Has the patient contacted their pharmacy? Yes (Agent: If no, request that the patient contact the pharmacy for the refill. If patient does not wish to contact the pharmacy document the reason why and proceed with request.) (Agent: If yes, when and what did the pharmacy advise?) Contact doctor  This is the patient's preferred pharmacy:  [facility] [address], [place] - [address] [place] [address] Phone: [phone] Fax: [phone]  Is this the correct pharmacy for this prescription? Yes If no, delete pharmacy and type the correct one.   Has the prescription been filled recently? Yes  Is the patient out of the medication? No  Has the patient been seen for an appointment in the last year OR does the patient have an upcoming appointment? Yes  Can we respond through MyChart? Yes  Agent: Please be advised that Rx refills may take up to 3 business days. We ask that you follow-up with your pharmacy.

## 2024-06-25 MED ORDER — LEVOTHYROXINE SODIUM 88 MCG PO TABS: ORAL_TABLET | ORAL | 2 refills | Status: AC

## 2024-06-26 ENCOUNTER — Ambulatory Visit (INDEPENDENT_AMBULATORY_CARE_PROVIDER_SITE_OTHER)

## 2024-06-26 DIAGNOSIS — Z23 Encounter for immunization: Secondary | ICD-10-CM

## 2024-06-27 DIAGNOSIS — K080 Exfoliation of teeth due to systemic causes: Secondary | ICD-10-CM | POA: Diagnosis not present

## 2024-07-21 ENCOUNTER — Other Ambulatory Visit: Payer: Self-pay | Admitting: Student

## 2024-07-23 ENCOUNTER — Other Ambulatory Visit: Payer: Self-pay | Admitting: Student

## 2024-08-17 ENCOUNTER — Other Ambulatory Visit: Payer: Self-pay | Admitting: Student

## 2024-09-15 ENCOUNTER — Other Ambulatory Visit: Payer: Self-pay | Admitting: Family Medicine

## 2024-09-15 DIAGNOSIS — R232 Flushing: Secondary | ICD-10-CM

## 2024-09-17 NOTE — Progress Notes (Unsigned)
 " Electrophysiology Office Note:   Date:  09/18/2024  ID:  Marissa Hart, DOB 1934/12/24, MRN [id number]  Primary Cardiologist: None Primary Heart Failure: None Electrophysiologist: Kasiyah Platter Gladis Norton, MD      History of Present Illness:   Marissa Hart is a 89 y.o. female with h/o SVT, atrial fibrillation seen today for routine electrophysiology followup.   Discussed the use of AI scribe software for clinical note transcription with the patient, who gave verbal consent to proceed.  History of Present Illness Marissa Hart is an 89 year old female with hypertension who presents for evaluation of her blood pressure management.  She has been experiencing elevated blood pressure readings, with measurements reaching 160-170 mmHg by suppertime. She is currently on a 25 mg dose of an unspecified medication taken at bedtime. She monitors her blood pressure at home using an electronic device and notes variability in readings, with a recent measurement dropping from 140 mmHg to 130 mmHg after a short rest period.  She has a significant history of cardiac issues, having undergone three ablations for arrhythmias over the past thirty years. The last ablation was unsuccessful, but her heart has been stable recently. She occasionally experiences brief episodes of palpitations that resolve quickly.  She reports significant hearing loss, with one ear approximately 80% impaired and the other about 60% impaired. She attributes some of her hearing issues to attending loud concerts in the past, specifically mentioning a [employer] concert about ten years ago where she experienced prolonged auditory fog. She uses hearing aids, which help in one-on-one conversations but finds it challenging to hear in group settings.   she denies chest pain, palpitations, dyspnea, PND, orthopnea, nausea, vomiting, dizziness, syncope, edema, weight gain, or early satiety.   Review of systems complete and found to be  negative unless listed in HPI.   EP Information / Studies Reviewed:    EKG is ordered today. Personal review as below.  EKG Interpretation Date/Time:  Wednesday September 18 2024 10:12:26 EST Ventricular Rate:  75 PR Interval:  326 QRS Duration:  74 QT Interval:  390 QTC Calculation: 435 R Axis:   3  Text Interpretation: Sinus rhythm with 1st degree A-V block When compared with ECG of 28-Jul-2023 07:47, No significant change was found Confirmed by Scarlette Hogston ([id number]) on 09/18/2024 10:25:06 AM    Risk Assessment/Calculations:    CHA2DS2-VASc Score = 4   This indicates a 4.8% annual risk of stroke. The patient's score is based upon: CHF History: 0 HTN History: 1 Diabetes History: 0 Stroke History: 0 Vascular Disease History: 0 Age Score: 2 Gender Score: 1            Physical Exam:   VS:  BP (!) 150/70 (BP Location: Left Arm, Patient Position: Sitting, Cuff Size: Normal)   Pulse 75   Ht 5' 2 (1.575 m)   Wt 129 lb 1.6 oz (58.6 kg)   SpO2 96%   BMI 23.61 kg/m    Wt Readings from Last 3 Encounters:  09/18/24 129 lb 1.6 oz (58.6 kg)  05/15/24 132 lb (59.9 kg)  01/31/24 130 lb 1.6 oz (59 kg)     GEN: Well nourished, well developed in no acute distress NECK: No JVD; No carotid bruits CARDIAC: Regular rate and rhythm, no murmurs, rubs, gallops RESPIRATORY:  Clear to auscultation without rales, wheezing or rhonchi  ABDOMEN: Soft, non-tender, non-distended EXTREMITIES:  No edema; No deformity   ASSESSMENT AND PLAN:    1.  Paroxysmal atrial fibrillation:  Well-controlled.  Post ablation in 2014.  No further episodes of atrial fibrillation.  Latrease Kunde continue with current management.  2.  Secondary hypercoagulable state: On Savaysa   3.  Atrial tachycardia: Has had multiple morphologies.  Has had several ablations and has now rare palpitations.  Had ILR previously but was removed in 2022.  Minimal episodes.  Trigg Delarocha continue with current management  4.  Hypertension:  Elevated today.  Has been elevated at home.  Ndeye Tenorio increase losartan  to 50 mg.  This can be further adjusted by her PCP.   Follow up with EP APP in 12 months  Signed, Derrian Poli Gladis Norton, MD  "

## 2024-09-18 ENCOUNTER — Encounter: Payer: Self-pay | Admitting: Cardiology

## 2024-09-18 ENCOUNTER — Ambulatory Visit: Admitting: Cardiology

## 2024-09-18 VITALS — BP 150/70 | HR 75 | Ht 62.0 in | Wt 129.1 lb

## 2024-09-18 DIAGNOSIS — I471 Supraventricular tachycardia, unspecified: Secondary | ICD-10-CM | POA: Diagnosis not present

## 2024-09-18 DIAGNOSIS — I10 Essential (primary) hypertension: Secondary | ICD-10-CM

## 2024-09-18 DIAGNOSIS — D6869 Other thrombophilia: Secondary | ICD-10-CM | POA: Diagnosis not present

## 2024-09-18 DIAGNOSIS — I480 Paroxysmal atrial fibrillation: Secondary | ICD-10-CM | POA: Diagnosis not present

## 2024-09-18 MED ORDER — LOSARTAN POTASSIUM 50 MG PO TABS: 50.0000 mg | ORAL_TABLET | Freq: Every day | ORAL | 3 refills | Status: AC

## 2024-09-18 NOTE — Addendum Note (Signed)
 Addended by: CASIMIR ALDONA BRAVO on: 09/18/2024 10:43 AM   Modules accepted: Orders

## 2024-09-18 NOTE — Patient Instructions (Signed)
 Medication Instructions: Your physician has recommended you make the following change in your medication:   ** Stop Losartan  25mg    ** Begin Losartan  50mg  - 1 tablet by mouth daily  *If you need a refill on your cardiac medications before your next appointment, please call your pharmacy*  Lab Work: None ordered.  If you have labs (blood work) drawn today and your tests are completely normal, you will receive your results only by: MyChart Message (if you have MyChart) OR A paper copy in the mail If you have any lab test that is abnormal or we need to change your treatment, we will call you to review the results.  Testing/Procedures: None ordered.   Follow-Up: At [facility], you and your health needs are our priority.  As part of our continuing mission to provide you with exceptional heart care, our providers are all part of one team.  This team includes your primary Cardiologist (physician) and Advanced Practice Providers or APPs (Physician Assistants and Nurse Practitioners) who all work together to provide you with the care you need, when you need it.  Your next appointment:   12 months with Jodie Passey, PA-C

## 2024-09-25 ENCOUNTER — Other Ambulatory Visit: Payer: Self-pay | Admitting: Adult Health

## 2024-09-25 DIAGNOSIS — E039 Hypothyroidism, unspecified: Secondary | ICD-10-CM

## 2024-10-14 ENCOUNTER — Other Ambulatory Visit: Payer: Self-pay | Admitting: Student

## 2025-01-29 ENCOUNTER — Ambulatory Visit
